# Patient Record
Sex: Male | Born: 1945 | Race: White | Hispanic: No | Marital: Married | Smoking: Former smoker
Health system: Southern US, Community
[De-identification: ages and names within clinical notes are randomized; demographics above are authoritative.]

## PROBLEM LIST (undated history)

## (undated) DIAGNOSIS — F329 Major depressive disorder, single episode, unspecified: Secondary | ICD-10-CM

## (undated) DIAGNOSIS — F32A Depression, unspecified: Secondary | ICD-10-CM

## (undated) DIAGNOSIS — I509 Heart failure, unspecified: Secondary | ICD-10-CM

## (undated) DIAGNOSIS — J15211 Pneumonia due to Methicillin susceptible Staphylococcus aureus: Secondary | ICD-10-CM

## (undated) DIAGNOSIS — E039 Hypothyroidism, unspecified: Secondary | ICD-10-CM

## (undated) DIAGNOSIS — J9621 Acute and chronic respiratory failure with hypoxia: Secondary | ICD-10-CM

## (undated) DIAGNOSIS — I10 Essential (primary) hypertension: Secondary | ICD-10-CM

## (undated) DIAGNOSIS — T80219A Unspecified infection due to central venous catheter, initial encounter: Secondary | ICD-10-CM

## (undated) HISTORY — PX: EYE SURGERY: SHX253

## (undated) HISTORY — DX: Heart failure, unspecified: I50.9

## (undated) HISTORY — DX: Acute and chronic respiratory failure with hypoxia: J96.21

## (undated) HISTORY — DX: Pneumonia due to methicillin susceptible Staphylococcus aureus: J15.211

## (undated) HISTORY — DX: Unspecified infection due to central venous catheter, initial encounter: T80.219A

---

## 2011-06-04 ENCOUNTER — Inpatient Hospital Stay: Payer: Self-pay | Admitting: Internal Medicine

## 2011-06-04 ENCOUNTER — Ambulatory Visit: Payer: Self-pay | Admitting: Internal Medicine

## 2011-06-04 LAB — COMPREHENSIVE METABOLIC PANEL
Albumin: 2.8 g/dL — ABNORMAL LOW (ref 3.4–5.0)
Anion Gap: 15 (ref 7–16)
BUN: 10 mg/dL (ref 7–18)
Chloride: 98 mmol/L (ref 98–107)
EGFR (African American): >60
EGFR (Non-African Amer.): >60
Glucose: 125 mg/dL — ABNORMAL HIGH (ref 65–99)
Osmolality: 265 (ref 275–301)
Potassium: 4.5 mmol/L (ref 3.5–5.1)
SGOT(AST): 43 U/L — ABNORMAL HIGH (ref 15–37)
Sodium: 132 mmol/L — ABNORMAL LOW (ref 136–145)
Total Protein: 6.4 g/dL (ref 6.4–8.2)

## 2011-06-04 LAB — CBC
HGB: 13 g/dL (ref 13.0–18.0)
MCV: 89 fL (ref 80–100)
RBC: 4.38 10*6/uL — ABNORMAL LOW (ref 4.40–5.90)
WBC: 9.5 10*3/uL (ref 3.8–10.6)

## 2011-06-05 LAB — TROPONIN I: Troponin-I: <0.02 ng/mL

## 2011-06-05 LAB — PRO B NATRIURETIC PEPTIDE: B-Type Natriuretic Peptide: 988 pg/mL — ABNORMAL HIGH (ref 0–125)

## 2011-06-05 LAB — BASIC METABOLIC PANEL
Anion Gap: 10 (ref 7–16)
BUN: 9 mg/dL (ref 7–18)
Calcium, Total: 8 mg/dL — ABNORMAL LOW (ref 8.5–10.1)
Co2: 26 mmol/L (ref 21–32)
EGFR (Non-African Amer.): >60
Glucose: 118 mg/dL — ABNORMAL HIGH (ref 65–99)
Potassium: 4 mmol/L (ref 3.5–5.1)
Sodium: 130 mmol/L — ABNORMAL LOW (ref 136–145)

## 2011-06-23 ENCOUNTER — Ambulatory Visit: Payer: Self-pay | Admitting: Internal Medicine

## 2013-05-04 IMAGING — CR DG CHEST 2V
1 series · 2 of 2 positions shown · non-contrast
Comparison: none

REASON FOR EXAM: cough dyspnea
COMMENTS:

PROCEDURE:     MDR - MDR CHEST PA(OR AP) AND LATERAL  - June 04, 2011  [DATE]
RESULT:     There is increased density at the left base compatible with
pneumonia. Followup examination until clear is recommended. Right lung field
is clear. Heart size is upper limits for normal or slightly increased.

[Series 1: pa · 0.17mm/px · 2 of 2 slices shown]
[im 1/2]
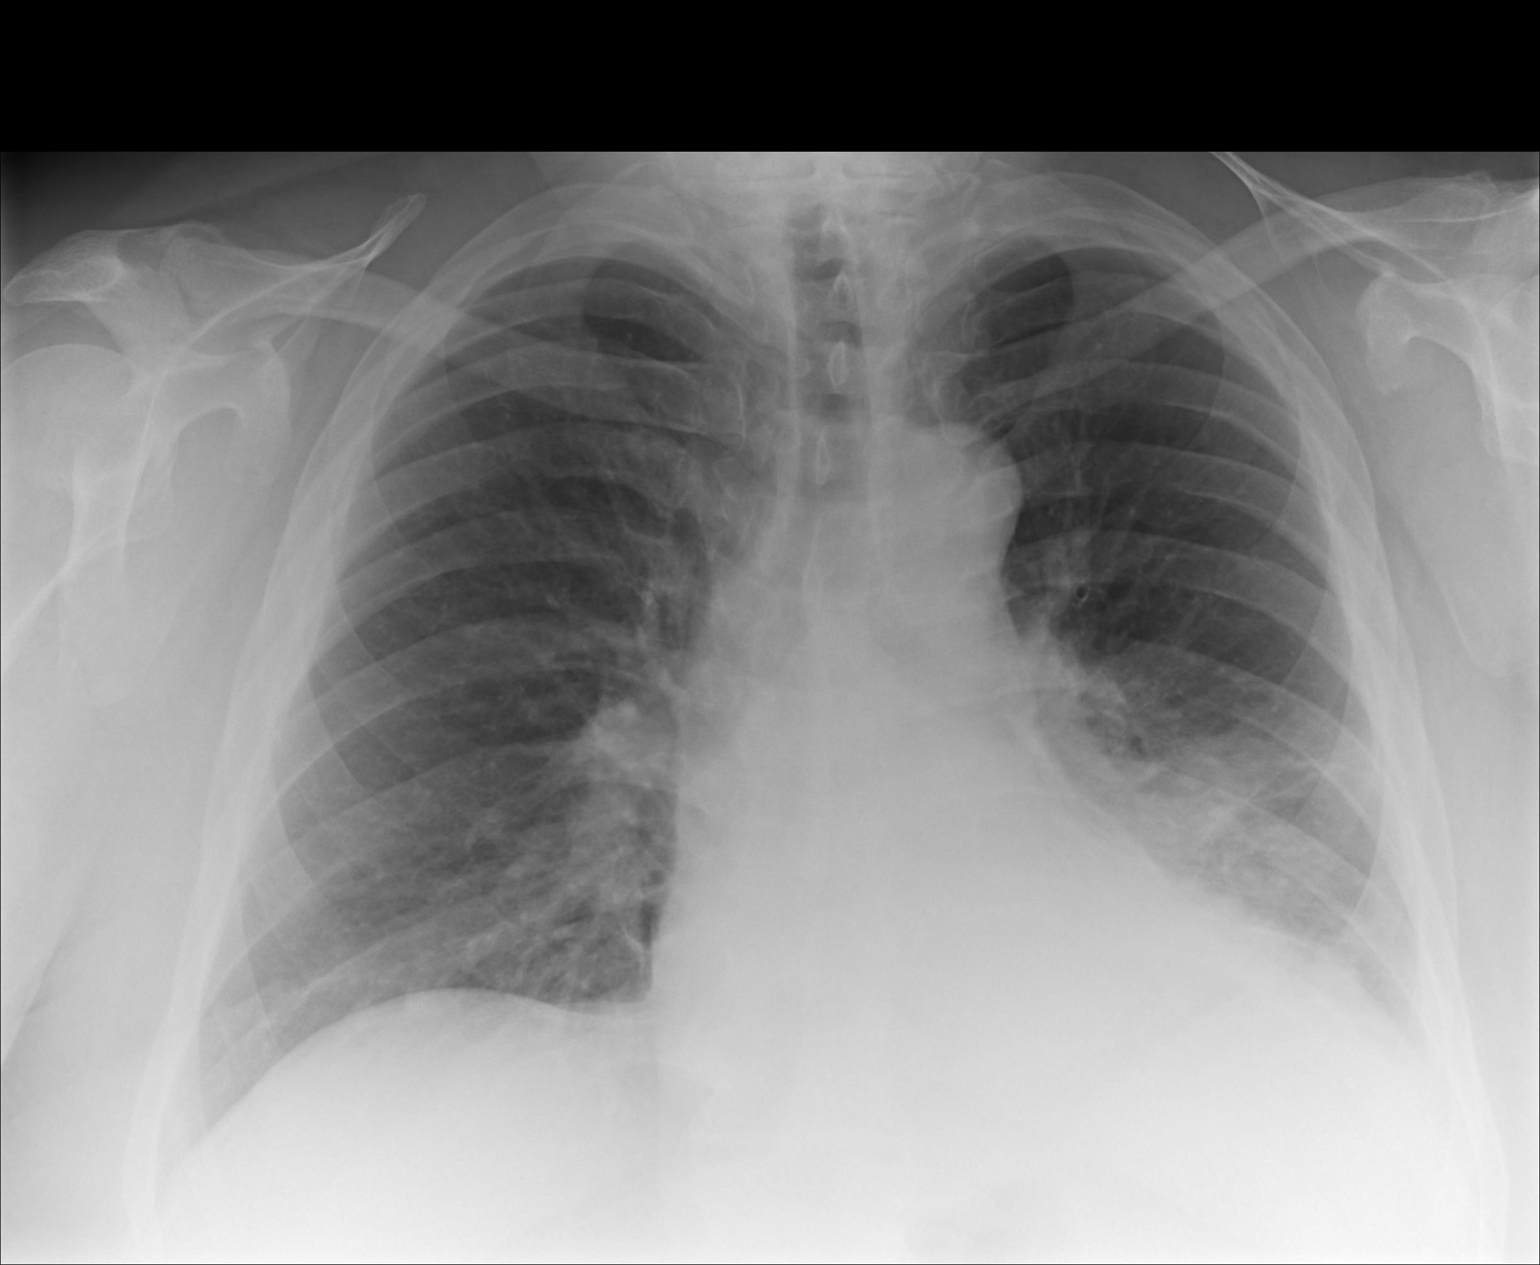
[im 2/2]
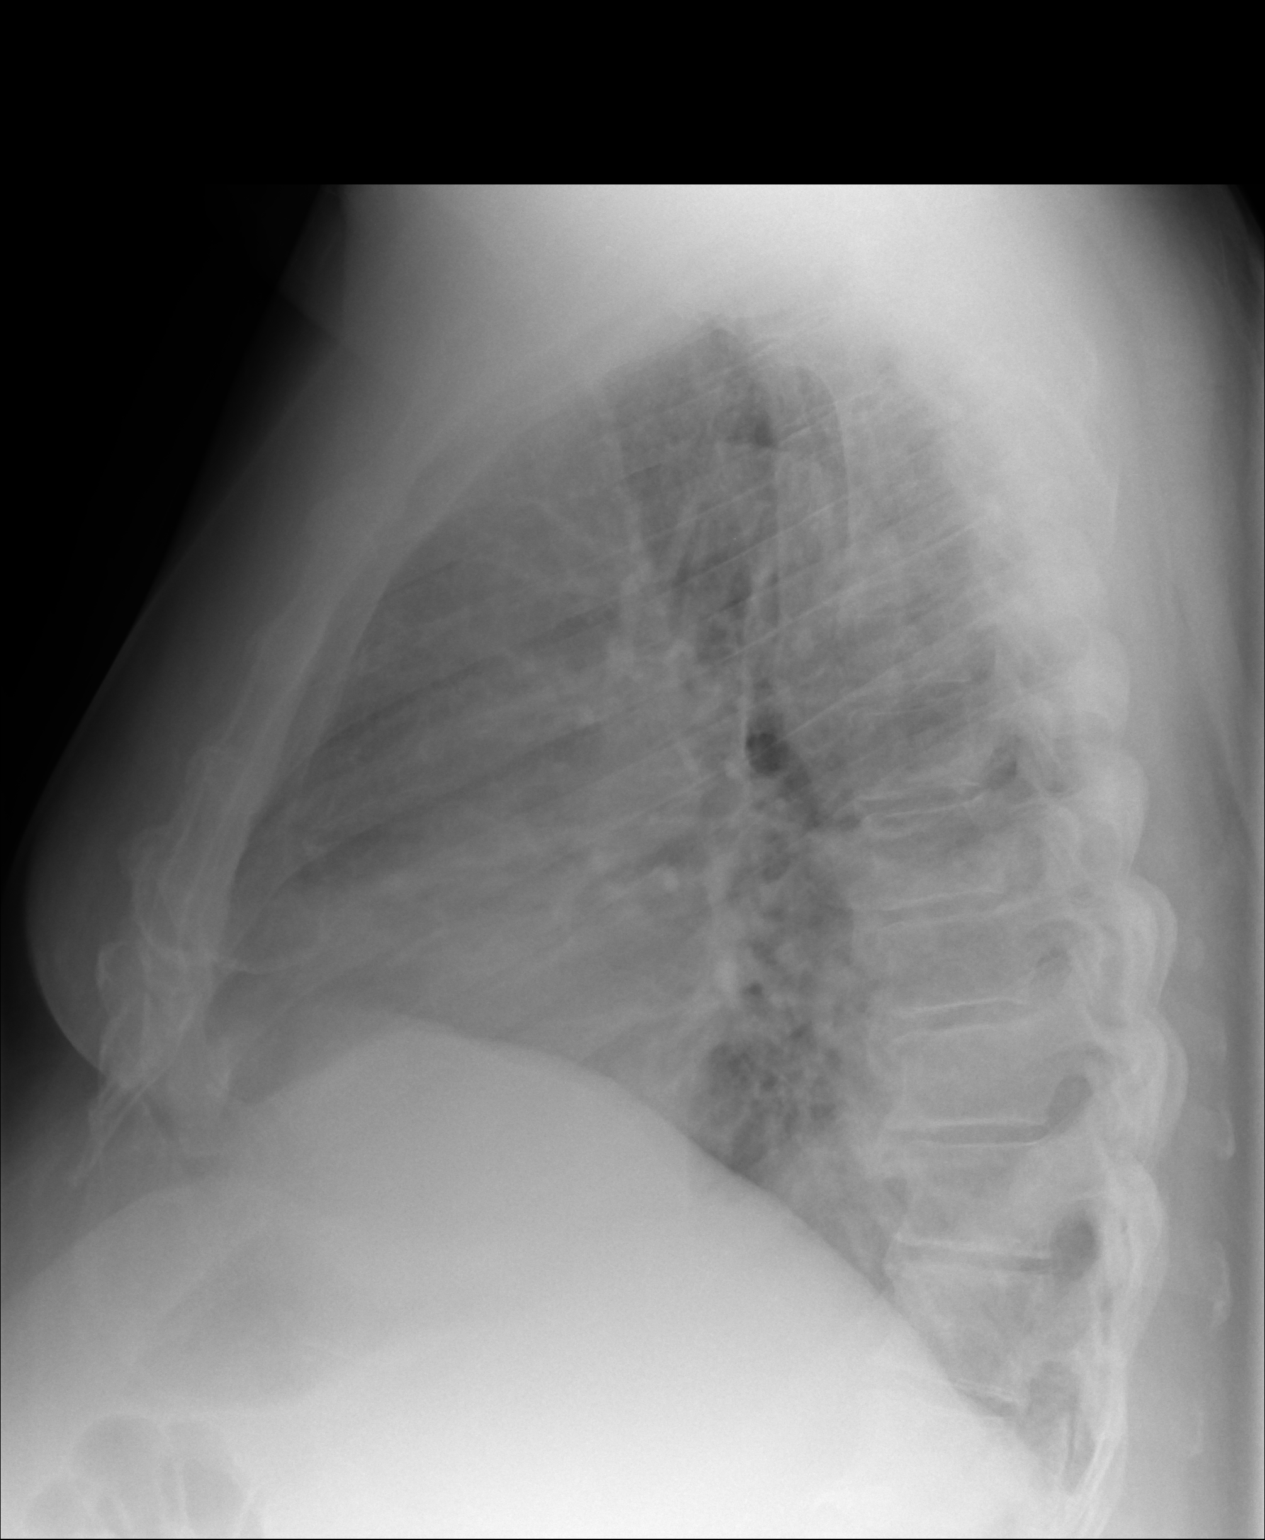

[2 of 2 positions shown; findings below may reference images not displayed]

IMPRESSION: 1. There is increased density at the left base consistent with left basilar
pneumonia. Followup examination until clear is recommended.
2. The heart is a upper limits for normal in size or mildly enlarged.

## 2013-05-09 IMAGING — CR DG CHEST 2V
1 series · 4 of 4 positions shown · non-contrast
Comparison: none

REASON FOR EXAM: pneumonia
COMMENTS:

[Series 1: ap · 0.17mm/px · 4 of 4 slices shown]
[im 1/4]
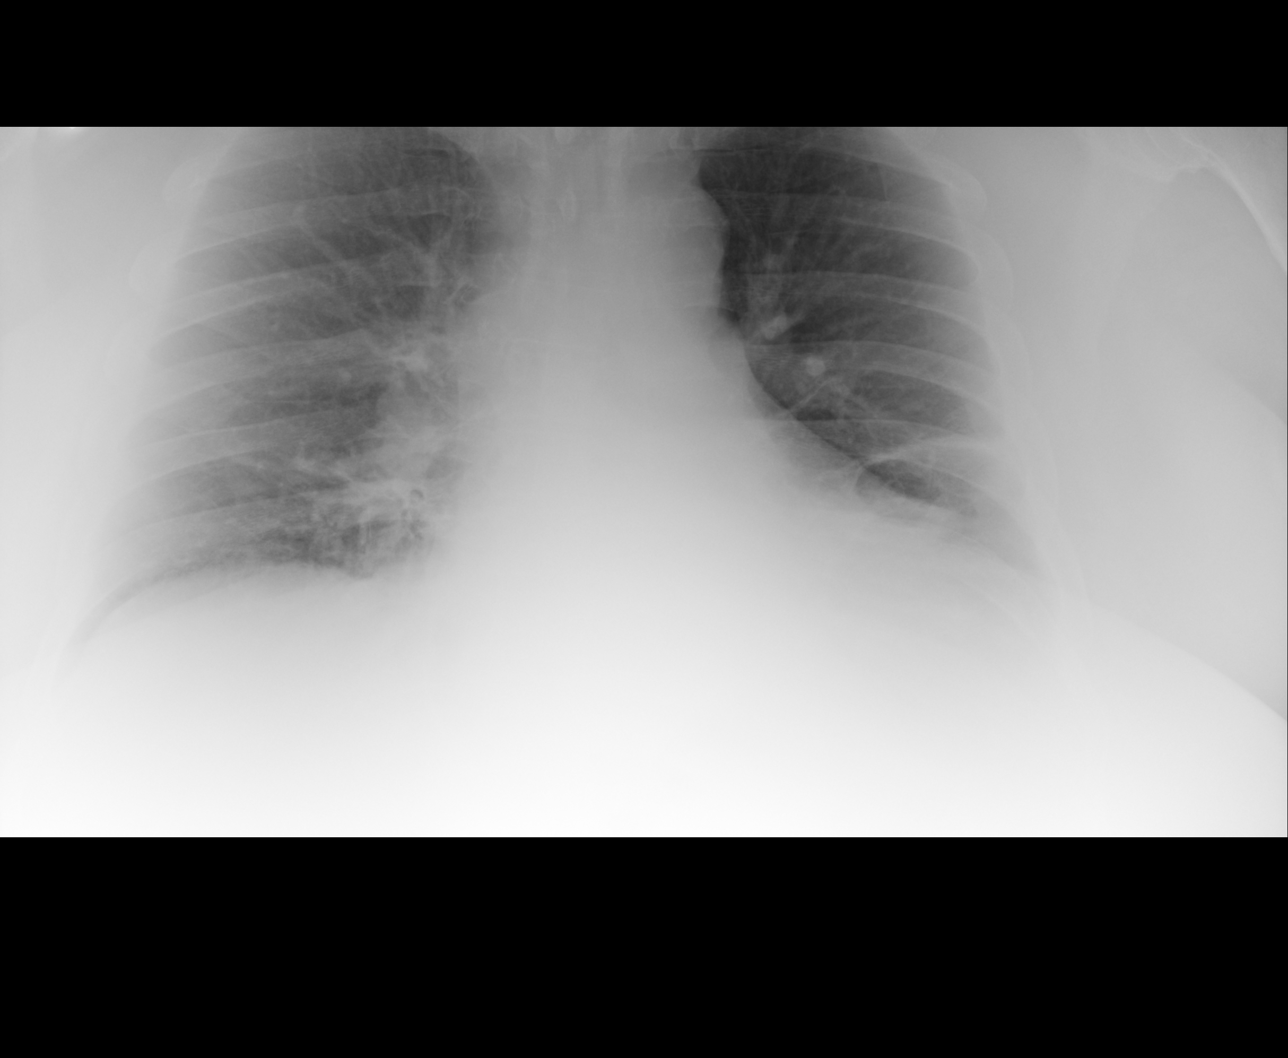
[im 2/4]
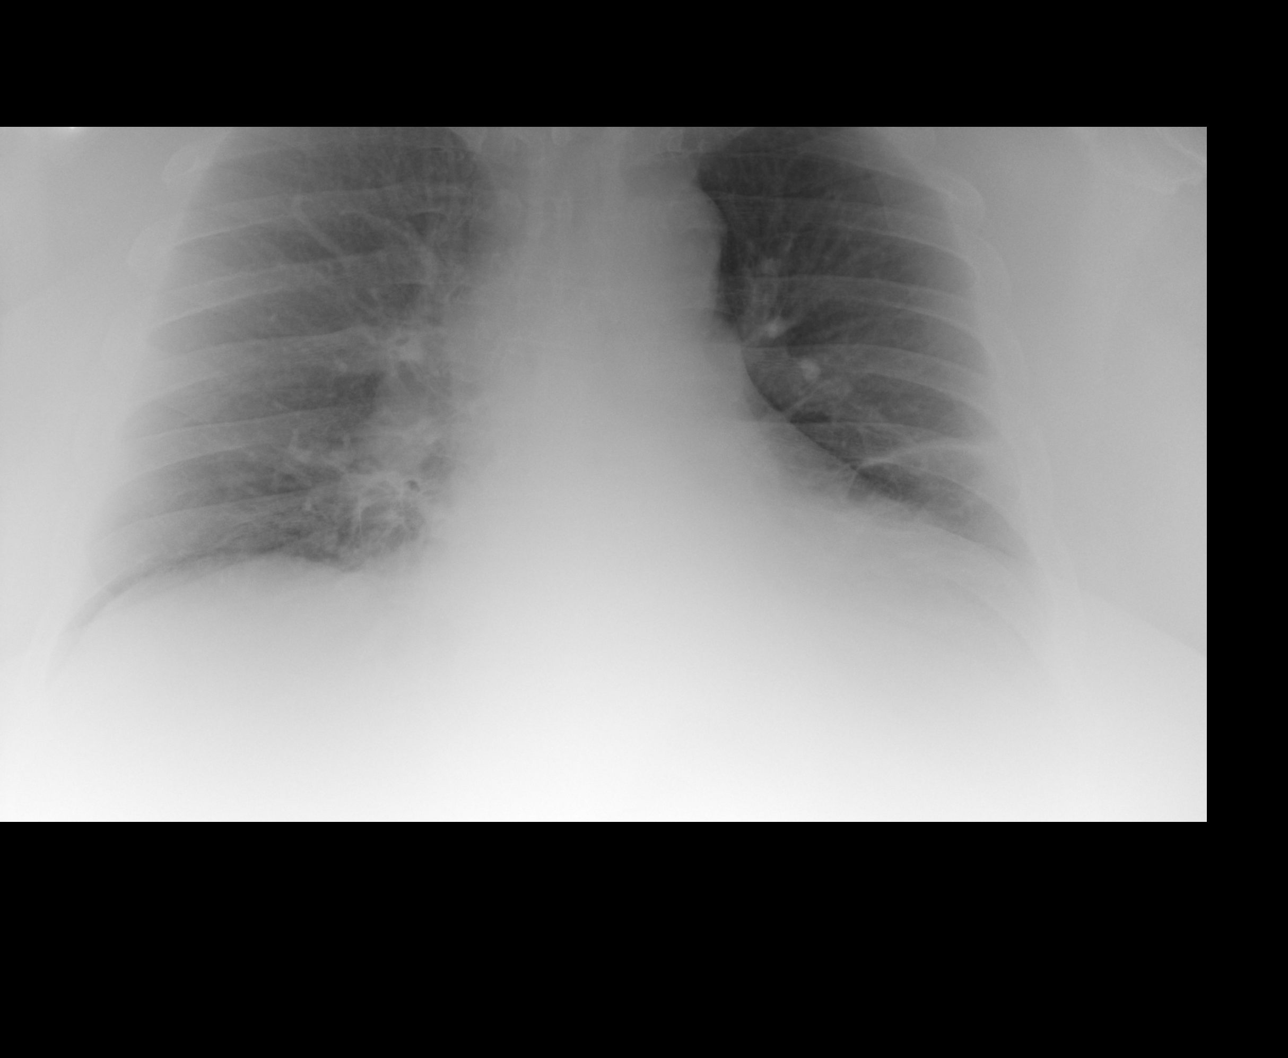
[im 3/4]
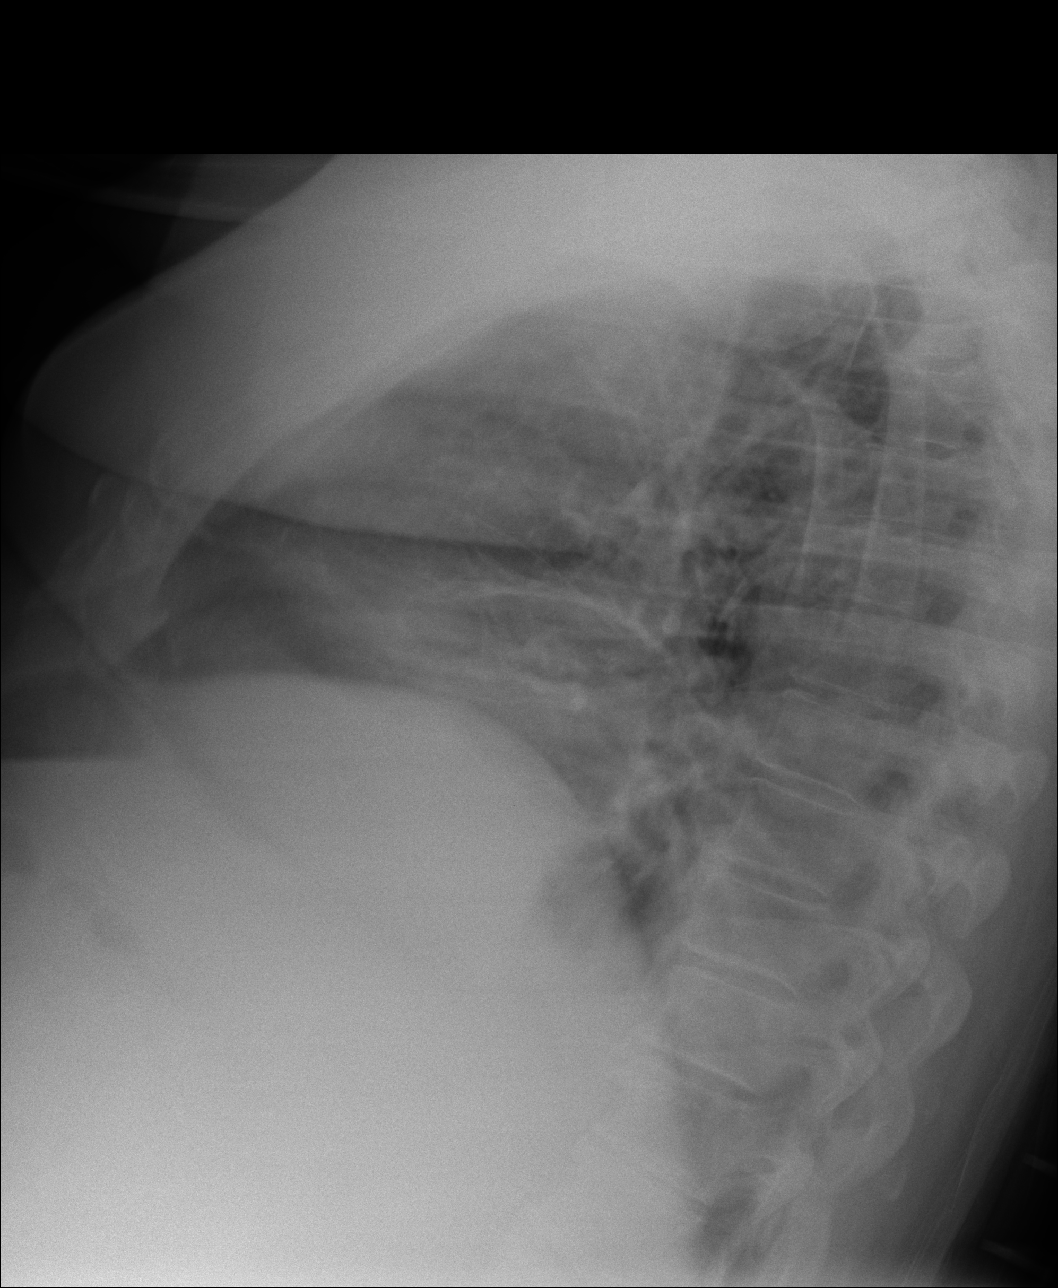
[im 4/4]
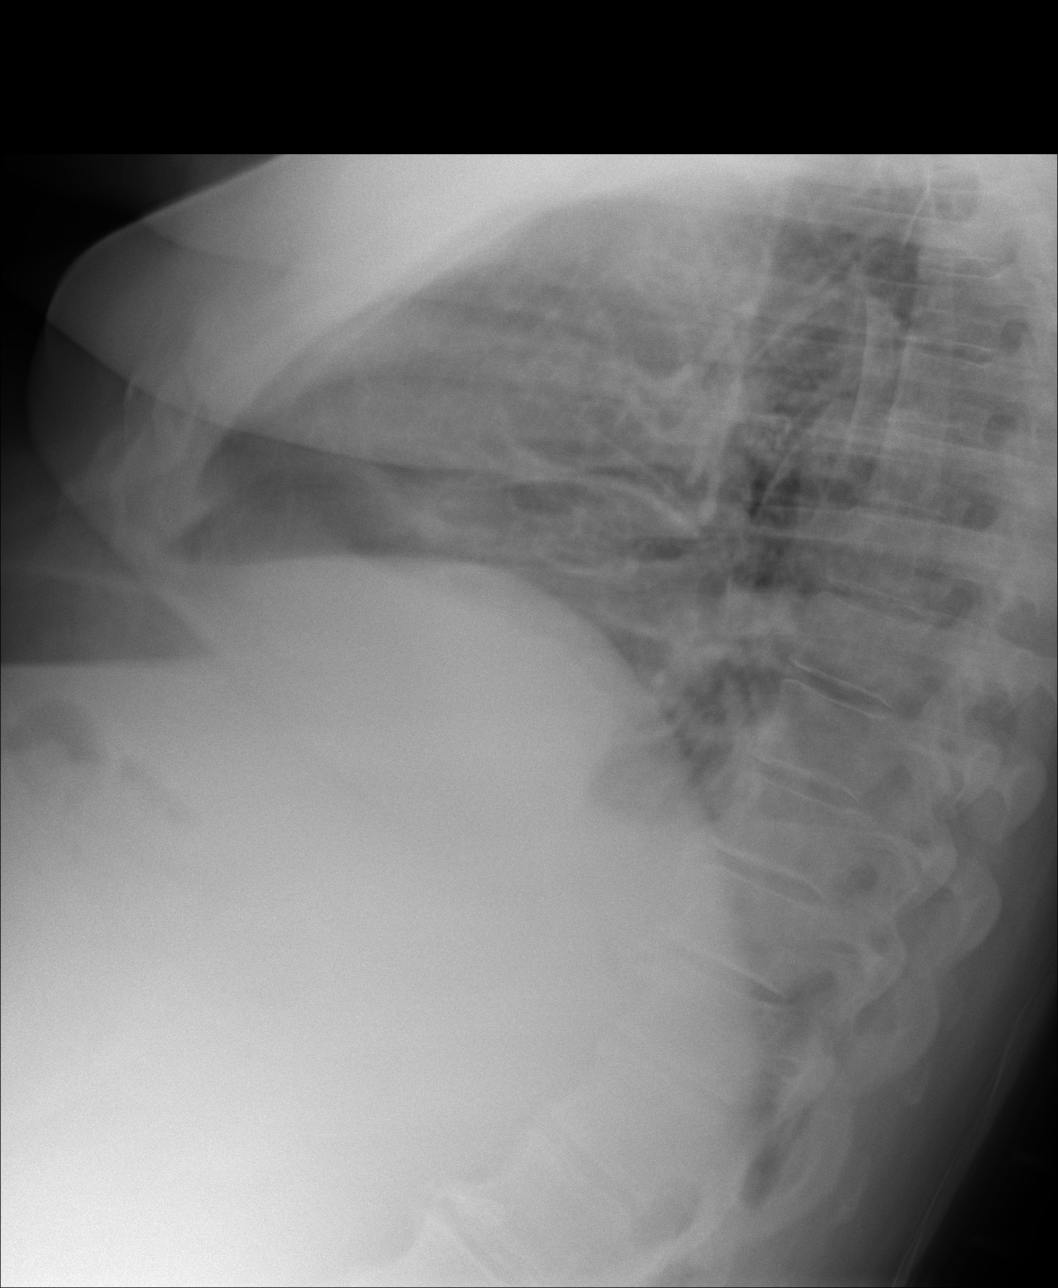

[4 of 4 positions shown; findings below may reference images not displayed]

PROCEDURE:     DXR - DXR CHEST PA (OR AP) AND LATERAL  - June 09, 2011 [DATE]

RESULT:     Comparison is made to the prior exam of 06/04/2011. There is
observed improvement in the previously noted density at the left base.
Resolution is not totally complete. The right lung field is clear. The heart
is stable in size and is again noted to be upper limits for normal or
slightly enlarged.
IMPRESSION: 1. There is observed improvement in the previously noted left basilar
pneumonia or atelectasis.
2. No new pulmonary infiltrates are seen.
3. No pulmonary edema is noted.
4. The heart is upper limits for normal in size or mildly enlarged but
stable in size as compared to the prior exam.

## 2014-08-04 NOTE — Discharge Summary (Signed)
PATIENT NAME:  Anthony Weber, Anthony Weber MR#:  [id number] DATE OF BIRTH:  Aug 14, 1945  DATE OF ADMISSION:  06/04/2011 DATE OF DISCHARGE:  06/09/2011  PRESENTING COMPLAINT: Cough, fever and shortness of breath.   DISCHARGE DIAGNOSES:  1. Right lower lobe pneumonia.  2. Reactive airway disease.  3. Hypertension.  4. Morbid obesity.  5. Suspected sleep apnea, work-up by primary care as an outpatient.  CONDITION ON DISCHARGE: Fair; saturation on room air 94%.   DISCHARGE MEDICATIONS:  1. Levothyroxine 0.05 mg p.o. daily.  2. Simvastatin 40 mg p.o. at bedtime.  3. Cheratussin AC 10/100 mg 5 mL every 6 hours. 4. Lisinopril 40 mg daily.  5. Levaquin 750 mg p.o. daily for five more days.  6. Spiriva one capsule inhalation daily.   DISCHARGE FOLLOWUP: Followup with Dr. Hall Weber on 06/21/2011 at 3:00 p.m.   LABS/STUDIES: Repeat chest x-ray on 06/09/2011 showed observed improvement in previously noted left basilar pneumonia or atelectasis. No pulmonary infiltrate seen.   CT of the chest, on 06/06/2011, showed consolidation involving a large portion of the left lower lobe. Small pleural effusion. No pulmonary embolism noted.   Echo Doppler showed moderately dilated left ventricle. Left ventricle apex is not well visualized. There is no thrombus. Left ventricular systolic function is mildly reduced. Ejection fraction 45 to 50%. There is normal left ventricular wall thickness.  LA and RA size are normal. No pericardial effusion.   Blood cultures show no growth in five days.   Hemoglobin and hematocrit 13 and 39.1, white count 9.5, platelet count 170. Glucose 125, BUN 10, creatinine 0.7, sodium 132, potassium 4.5, chloride 98, SGOT 43, albumin 2.8.   BRIEF SUMMARY OF HOSPITAL COURSE: Anthony Weber is a pleasant 69 year old morbidly obese Caucasian gentleman who came into the emergency room and got admitted with:  1. Left upper lobe pneumonia: The patient was initially started on Rocephin and Zithromax and was  changed to p.o. Levaquin for five more days to complete a total 10 day course of antibiotic. Blood cultures remained negative. The patient received p.r.n. nebulizers and incentive spirometer. He was also started on Solu-Medrol given his extensive wheezing and some underlying reactive airway disease, although he does not have a formal diagnosis of chronic obstructive pulmonary disease or asthma. The patient denies any history of smoking. His wheezing completely resolved. He does not need a p.o. taper steroid. CT of the chest was negative for PE and showed a large left lower lobe consolidation which on chest x-ray prior to discharge showed improvement. He will followup with Dr. Hall Weber on the above set appointment.  2. Hypertension: Home medications were continued with lisinopril.  3. Mild hyponatremia, resolved.  4. Hypothyroidism, on Synthroid.  5. Hypercholesterolemia, on simvastatin.  6. Suspected sleep apnea with obesity: The patient's sleep study and further work-up regarding sleep apnea can be done. We will defer it to Dr. Hall Weber as an outpatient.   His hospital stay otherwise remained stable. The patient remained a FULL CODE.  TIME SPENT: 40 minutes.  ____________________________ Hart Rochester Posey Pronto, MD sap:slb D: 06/10/2011 15:30:24 ET     T: 06/11/2011 11:15:42 ET       JOB#: [id number] cc: Anthony Hubbert A. Posey Pronto, MD, <Dictator> Anthony Carry. Hall Busing, MD Anthony Basset MD ELECTRONICALLY SIGNED 06/16/2011 6:25

## 2014-08-04 NOTE — H&P (Signed)
PATIENT NAME:  Anthony Weber, Anthony Weber MR#:  [id number] DATE OF BIRTH:  1945-07-18  DATE OF ADMISSION:  06/04/2011  PRIMARY CARE PHYSICIAN: Dr. Benita Stabile.    CHIEF COMPLAINT: Cough, fever, and increased shortness of breath.   HISTORY OF PRESENT ILLNESS: Anthony Weber is a 69 year old Caucasian male who was in his usual state of health until about 4 or 5 days ago when he started to have cough with sputum production and what he called congestion. He contacted his primary care physician and he was placed on Zithromax or Z-Pak, and he used also cough medicine. However, his condition worsened and he became short of breath especially upon walking and experienced continuation of fever. Today he called his primary care physician, and a chest x-ray was ordered; and that revealed pneumonia. The patient was brought to the emergency department for evaluation and to be admitted to the hospital for further treatment.   REVIEW OF SYSTEMS: CONSTITUTIONAL: Patient reports fever and chills. No night sweats. He has fatigue. EYES: No blurring of vision. No double vision. ENT: No hearing impairment. No sore throat. No dysphagia. CARDIOVASCULAR: No chest pain, only upon coughing. He admits having shortness of breath. No edema. No syncope. RESPIRATORY: Has cough, sputum production, and shortness of breath. GASTROINTESTINAL: No abdominal pain. No nausea, no vomiting, no diarrhea. GENITOURINARY: No dysuria or frequency of urination.  MUSCULOSKELETAL: No joint swelling. No pain. No muscular pain or swelling. INTEGUMENTARY: No skin rash. No ulcers. NEUROLOGY: No focal weakness. No seizure activity. No headaches. PSYCHIATRY: No anxiety, no depression. ENDOCRINE: No night sweats. No heat or cold intolerance.   PAST MEDICAL HISTORY:  History of systemic hypertension, hypercholesterolemia, hypothyroidism. No prior history of lung disease or coronary artery disease.   FAMILY HISTORY: His mother suffers from hypertension.   SOCIAL HISTORY: He  is married, living with his wife; and he works as a Merchandiser, retail. Social habits: Nonsmoker. There is a remote history of smoking more than 45 years ago, and he was a light smoker. No history of alcoholism.   ADMISSION MEDICATIONS:  1. Lisinopril 40 mg a day.  2. Simvastatin 40 mg once a day.   3. Levothyroxine 50 mcg once a day.   4. Aspirin 325 mg once a day.  5. He takes vitamin C, vitamin D, folic acid, and vitamin B12.   ALLERGIES: No known drug allergies.   PHYSICAL EXAMINATION:   VITAL SIGNS: His blood pressure here 181/86, respiratory rate 25, pulse 112, temperature 101.8, oxygen saturation 93%.   GENERAL APPEARANCE: Elderly male, obese, lying in bed, receiving breathing treatment at this time. He feels that his shortness of breath is better than when he came.   HEAD: No pallor. No icterus. No cyanosis.   EENT: Hearing was normal. Nasal mucosa, lips, tongue were normal. Eyes examination revealed normal eyelids and conjunctiva. Pupils about 4 to 5 mm, equal and reactive to light.   NECK: Supple. Trachea at midline. No cervical lymphadenopathy. No masses.   HEART:  Revealed distant heart sounds. Difficult to auscultate. Faint S1 and S2. No murmur was appreciated. No carotid bruits.   RESPIRATORY:  Normal breathing pattern without use of accessory muscles. No rales or wheezing heard. Breathing sounds are diminished due to his obesity.   ABDOMEN: Morbidly obese, soft without tenderness. No hepatosplenomegaly. No masses. No hernias.   SKIN: No ulcers. No subcutaneous nodules.   MUSCULOSKELETAL: No joint swelling. No clubbing.   NEUROLOGIC: Cranial nerves II through XII were intact. No focal motor deficit.  PSYCHIATRIC: The patient is alert and oriented x3. Mood and affect were normal.   LABORATORY FINDINGS: His EKG showed sinus tachycardia at rate of 111 per minute.  Unremarkable EKG. Chest x-ray showed left lower lobe consolidation consistent with pneumonia. His CBC showed a  white count of 9500, hemoglobin 13, hematocrit 39, platelet count 170,000. His serum glucose 125.  BUN 10, creatinine 0.7. Sodium was 132, potassium 4.5. His estimated GFR is more than 60. His total protein 6.4, albumin was 2.8, total bilirubin 0.4, AST 43, ALT 23.   IMPRESSION:  1. Fever, cough, and increased shortness of breath secondary to pneumonia.  His chest x-ray is consistent with left lower lobe pneumonia.  2. History of systemic hypertension. Blood pressure is uncontrolled possibly secondary to his stress. We need to keep monitoring blood pressure to see if this settles while he is on his lisinopril.  3. History of mild hyponatremia. Sodium is 132. Consider underlying volume overload to bare in mind possibility of CHF as a complicating factor. We need to monitor that.  I will repeat BMP tomorrow.  4. History of hypercholesterolemia.  5. History of hypothyroidism.   6. Obesity.   PLAN: The patient was admitted as inpatient.  Blood cultures were taken. IV antibiotic initiated in the emergency department using Rocephin 1 gram and Zithromax. I will continue the same antibiotics. Albuterol or DuoNeb nebulization q.4 hours p.r.n. Continue home medications as listed above.  Deep vein thrombosis prophylaxis will be initiated using Lovenox 40 mg subcutaneous once a day and peptic ulcer disease prophylaxis using Protonix 40 mg p.o. daily.   TIME NEEDED TO EVALUATE THIS PATIENT: More than 50 minutes.   ____________________________ Anthony Pu. Lenore Manner, MD amd:vtd D: 06/04/2011 35:45:62 ET T: 06/05/2011 08:52:36 ET JOB#: [id number] cc: Anthony Pu. Lenore Manner, MD, <Dictator> Leona Carry. Hall Busing, MD Ellin Saba MD ELECTRONICALLY SIGNED 06/06/2011 3:47

## 2015-03-03 ENCOUNTER — Encounter
Admission: RE | Disposition: A | Discharge status: Home / Self Care | Payer: Self-pay | Source: Ambulatory Visit | Attending: Unknown Physician Specialty

## 2015-03-03 ENCOUNTER — Ambulatory Visit: Payer: Medicare HMO | Admitting: Anesthesiology

## 2015-03-03 ENCOUNTER — Ambulatory Visit
Admission: RE | Admit: 2015-03-03 | Discharge: 2015-03-03 | Disposition: A | Discharge status: Home / Self Care | Payer: Medicare HMO | Source: Ambulatory Visit | Attending: Unknown Physician Specialty | Admitting: Unknown Physician Specialty

## 2015-03-03 DIAGNOSIS — K5730 Diverticulosis of large intestine without perforation or abscess without bleeding: Secondary | ICD-10-CM | POA: Diagnosis not present

## 2015-03-03 DIAGNOSIS — K640 First degree hemorrhoids: Secondary | ICD-10-CM | POA: Insufficient documentation

## 2015-03-03 DIAGNOSIS — E039 Hypothyroidism, unspecified: Secondary | ICD-10-CM | POA: Insufficient documentation

## 2015-03-03 DIAGNOSIS — Z1211 Encounter for screening for malignant neoplasm of colon: Secondary | ICD-10-CM | POA: Insufficient documentation

## 2015-03-03 DIAGNOSIS — Z87891 Personal history of nicotine dependence: Secondary | ICD-10-CM | POA: Insufficient documentation

## 2015-03-03 DIAGNOSIS — D124 Benign neoplasm of descending colon: Secondary | ICD-10-CM | POA: Diagnosis not present

## 2015-03-03 DIAGNOSIS — I10 Essential (primary) hypertension: Secondary | ICD-10-CM | POA: Insufficient documentation

## 2015-03-03 DIAGNOSIS — F329 Major depressive disorder, single episode, unspecified: Secondary | ICD-10-CM | POA: Diagnosis not present

## 2015-03-03 HISTORY — DX: Depression, unspecified: F32.A

## 2015-03-03 HISTORY — DX: Essential (primary) hypertension: I10

## 2015-03-03 HISTORY — DX: Hypothyroidism, unspecified: E03.9

## 2015-03-03 HISTORY — DX: Major depressive disorder, single episode, unspecified: F32.9

## 2015-03-03 HISTORY — PX: COLONOSCOPY WITH PROPOFOL: SHX5780

## 2015-03-03 SURGERY — COLONOSCOPY WITH PROPOFOL
Anesthesia: General

## 2015-03-03 MED ORDER — SODIUM CHLORIDE 0.9 % IV SOLN: INTRAVENOUS | Status: DC

## 2015-03-03 MED ORDER — DEXMEDETOMIDINE HCL 200 MCG/2ML IV SOLN
INTRAVENOUS | Status: DC | PRN
  Administered 2015-03-03: 8 ug via INTRAVENOUS

## 2015-03-03 MED ORDER — GLYCOPYRROLATE 0.2 MG/ML IJ SOLN
INTRAMUSCULAR | Status: DC | PRN
Start: 2015-03-03 — End: 2015-03-03
  Administered 2015-03-03: 0.2 mg via INTRAVENOUS

## 2015-03-03 MED ORDER — EPHEDRINE SULFATE 50 MG/ML IJ SOLN
INTRAMUSCULAR | Status: DC | PRN
  Administered 2015-03-03: 10 mg via INTRAVENOUS

## 2015-03-03 MED ORDER — PROPOFOL 500 MG/50ML IV EMUL
INTRAVENOUS | Status: DC | PRN
  Administered 2015-03-03: 100 ug/kg/min via INTRAVENOUS

## 2015-03-03 MED ORDER — SODIUM CHLORIDE 0.9 % IV SOLN
INTRAVENOUS | Status: DC
  Administered 2015-03-03: 1000 mL via INTRAVENOUS

## 2015-03-03 MED ORDER — LACTATED RINGERS IV SOLN
INTRAVENOUS | Status: DC | PRN
  Administered 2015-03-03: 11:00:00 via INTRAVENOUS

## 2015-03-03 MED ORDER — PROPOFOL 10 MG/ML IV BOLUS
INTRAVENOUS | Status: DC | PRN
  Administered 2015-03-03: 10 mg via INTRAVENOUS
  Administered 2015-03-03: 50 mg via INTRAVENOUS

## 2015-03-03 NOTE — Transfer of Care (Signed)
Immediate Anesthesia Transfer of Care Note  Patient: Anthony Weber  Procedure(s) Performed: Procedure(s): COLONOSCOPY WITH PROPOFOL (N/A)  Patient Location: PACU and Endoscopy Unit  Anesthesia Type:General  Level of Consciousness: awake and alert   Airway & Oxygen Therapy: Patient Spontanous Breathing and Patient connected to nasal cannula oxygen  Post-op Assessment: Report given to RN and Post -op Vital signs reviewed and stable  Post vital signs: Reviewed and stable  Last Vitals:  Filed Vitals:   03/03/15 1006  BP: 127/60  Pulse: 71  Temp: 36.2 C  Resp: 18    Complications: No apparent anesthesia complications

## 2015-03-03 NOTE — Op Note (Signed)
[facility] Gastroenterology Patient Name: Anthony Weber Procedure Date: 03/03/2015 10:32 AM MRN: [id number] Account #: [id number] Date of Birth: [birth year] Admit Type: Outpatient Age: 69 Room: [facility] ENDO ROOM 1 Gender: Male Note Status: Finalized Procedure:         Colonoscopy Indications:       Screening for colorectal malignant neoplasm Providers:         Manya Silvas, MD Referring MD:      Leona Carry. Hall Busing, MD (Referring MD) Medicines:         Propofol per Anesthesia Complications:     No immediate complications. Procedure:         Pre-Anesthesia Assessment:                    - After reviewing the risks and benefits, the patient was                     deemed in satisfactory condition to undergo the procedure.                    After obtaining informed consent, the colonoscope was                     passed under direct vision. Throughout the procedure, the                     patient's blood pressure, pulse, and oxygen saturations                     were monitored continuously. The Colonoscope was                     introduced through the anus and advanced to the the cecum,                     identified by appendiceal orifice and ileocecal valve. The                     colonoscopy was performed without difficulty. The patient                     tolerated the procedure well. The quality of the bowel                     preparation was adequate to identify polyps. Findings:      A diminutive polyp was found in the distal descending colon. The polyp       was sessile. The polyp was removed with a jumbo cold forceps. Resection       and retrieval were complete.      A few small-mouthed diverticula were found in the sigmoid colon.      Internal hemorrhoids were found during endoscopy. The hemorrhoids were       small and Grade I (internal hemorrhoids that do not prolapse).      The exam was otherwise without abnormality. Impression:        - One  diminutive polyp in the distal descending colon.                     Resected and retrieved.                    - Diverticulosis in the sigmoid colon.                    -  Internal hemorrhoids.                    - The examination was otherwise normal. Recommendation:    - Await pathology results. Manya Silvas, MD 03/03/2015 11:04:38 AM This report has been signed electronically. Number of Addenda: 0 Note Initiated On: 03/03/2015 10:32 AM Scope Withdrawal Time: 0 hours 6 minutes 40 seconds  Total Procedure Duration: 0 hours 11 minutes 53 seconds       [facility]

## 2015-03-03 NOTE — H&P (Signed)
   Primary Care Physician:  Albina Billet, MD Primary Gastroenterologist:  Dr. Vira Agar  Pre-Procedure History & Physical: HPI:  Anthony Weber is a 69 y.o. male is here for an colonoscopy.   Past Medical History  Diagnosis Date  . Hypertension   . Hypothyroidism   . Depression     Past Surgical History  Procedure Laterality Date  . Eye surgery      Prior to Admission medications   Not on File    Allergies as of 01/20/2015  . (Not on File)    History reviewed. No pertinent family history.  Social History   Social History  . Marital Status: Married    Spouse Name: N/A  . Number of Children: N/A  . Years of Education: N/A   Occupational History  . Not on file.   Social History Main Topics  . Smoking status: Former Research scientist (life sciences)  . Smokeless tobacco: Not on file  . Alcohol Use: No  . Drug Use: Not on file  . Sexual Activity: Not on file   Other Topics Concern  . Not on file   Social History Narrative  . No narrative on file    Review of Systems: See HPI, otherwise negative ROS  Physical Exam: BP 127/60 mmHg  Pulse 71  Temp(Src) 97.2 F (36.2 C) (Tympanic)  Resp 18  Ht 5\' 10"  (1.778 m)  Wt 136.079 kg (300 lb)  BMI 43.05 kg/m2  SpO2 98% General:   Alert,  pleasant and cooperative in NAD Head:  Normocephalic and atraumatic. Neck:  Supple; no masses or thyromegaly. Lungs:  Clear throughout to auscultation.    Heart:  Regular rate and rhythm. Abdomen:  Soft, nontender and nondistended. Normal bowel sounds, without guarding, and without rebound.   Neurologic:  Alert and  oriented x4;  grossly normal neurologically.  Impression/Plan: Anthony Weber is here for an colonoscopy to be performed for screening  Risks, benefits, limitations, and alternatives regarding  colonoscopy have been reviewed with the patient.  Questions have been answered.  All parties agreeable.   Gaylyn Cheers, MD  03/03/2015, 10:43 AM

## 2015-03-03 NOTE — Anesthesia Preprocedure Evaluation (Addendum)
Anesthesia Evaluation  Patient identified by MRN, date of birth, ID band Patient awake    Reviewed: Allergy & Precautions, NPO status , Patient's Chart, lab work & pertinent test results  Airway Mallampati: II  TM Distance: >3 FB Neck ROM: Full    Dental  (+) Partial Upper, Lower Dentures   Pulmonary former smoker,    Pulmonary exam normal breath sounds clear to auscultation       Cardiovascular hypertension, Pt. on medications Normal cardiovascular exam     Neuro/Psych Depression negative neurological ROS     GI/Hepatic negative GI ROS, Neg liver ROS,   Endo/Other  Hypothyroidism   Renal/GU negative Renal ROS  negative genitourinary   Musculoskeletal negative musculoskeletal ROS (+)   Abdominal Normal abdominal exam  (+)   Peds negative pediatric ROS (+)  Hematology negative hematology ROS (+)   Anesthesia Other Findings obese  Reproductive/Obstetrics                            Anesthesia Physical Anesthesia Plan  ASA: III  Anesthesia Plan: General   Post-op Pain Management:    Induction: Intravenous  Airway Management Planned: Nasal Cannula  Additional Equipment:   Intra-op Plan:   Post-operative Plan:   Informed Consent: I have reviewed the patients History and Physical, chart, labs and discussed the procedure including the risks, benefits and alternatives for the proposed anesthesia with the patient or authorized representative who has indicated his/her understanding and acceptance.   Dental advisory given  Plan Discussed with: CRNA and Surgeon  Anesthesia Plan Comments:         Anesthesia Quick Evaluation

## 2015-03-04 ENCOUNTER — Encounter: Payer: Self-pay | Admitting: Unknown Physician Specialty

## 2015-03-04 LAB — SURGICAL PATHOLOGY

## 2015-03-07 NOTE — Anesthesia Postprocedure Evaluation (Signed)
Anesthesia Post Note  Patient: Anthony Weber  Procedure(s) Performed: Procedure(s) (LRB): COLONOSCOPY WITH PROPOFOL (N/A)  Patient location during evaluation: Endoscopy Anesthesia Type: General Level of consciousness: awake, awake and alert and oriented Pain management: pain level controlled Vital Signs Assessment: post-procedure vital signs reviewed and stable Respiratory status: spontaneous breathing Cardiovascular status: blood pressure returned to baseline Anesthetic complications: no    Last Vitals:  Filed Vitals:   03/03/15 1150 03/03/15 1155  BP: 104/53 106/53  Pulse: 73 74  Temp:    Resp: 13 16    Last Pain:  Filed Vitals:   03/04/15 0756  PainSc: 0-No pain                 Gram Siedlecki

## 2015-06-20 DIAGNOSIS — E039 Hypothyroidism, unspecified: Secondary | ICD-10-CM | POA: Diagnosis not present

## 2015-06-20 DIAGNOSIS — E785 Hyperlipidemia, unspecified: Secondary | ICD-10-CM | POA: Diagnosis not present

## 2015-06-20 DIAGNOSIS — I10 Essential (primary) hypertension: Secondary | ICD-10-CM | POA: Diagnosis not present

## 2015-06-23 DIAGNOSIS — E785 Hyperlipidemia, unspecified: Secondary | ICD-10-CM | POA: Diagnosis not present

## 2015-06-23 DIAGNOSIS — I10 Essential (primary) hypertension: Secondary | ICD-10-CM | POA: Diagnosis not present

## 2016-01-02 DIAGNOSIS — I10 Essential (primary) hypertension: Secondary | ICD-10-CM | POA: Diagnosis not present

## 2016-01-02 DIAGNOSIS — E039 Hypothyroidism, unspecified: Secondary | ICD-10-CM | POA: Diagnosis not present

## 2016-01-02 DIAGNOSIS — E785 Hyperlipidemia, unspecified: Secondary | ICD-10-CM | POA: Diagnosis not present

## 2016-01-06 DIAGNOSIS — Z23 Encounter for immunization: Secondary | ICD-10-CM | POA: Diagnosis not present

## 2016-01-06 DIAGNOSIS — E785 Hyperlipidemia, unspecified: Secondary | ICD-10-CM | POA: Diagnosis not present

## 2016-01-06 DIAGNOSIS — E038 Other specified hypothyroidism: Secondary | ICD-10-CM | POA: Diagnosis not present

## 2016-01-06 DIAGNOSIS — I10 Essential (primary) hypertension: Secondary | ICD-10-CM | POA: Diagnosis not present

## 2016-02-19 DIAGNOSIS — J209 Acute bronchitis, unspecified: Secondary | ICD-10-CM | POA: Diagnosis not present

## 2016-02-19 DIAGNOSIS — R062 Wheezing: Secondary | ICD-10-CM | POA: Diagnosis not present

## 2016-05-03 DIAGNOSIS — H524 Presbyopia: Secondary | ICD-10-CM | POA: Diagnosis not present

## 2016-05-03 DIAGNOSIS — H26493 Other secondary cataract, bilateral: Secondary | ICD-10-CM | POA: Diagnosis not present

## 2016-05-27 DIAGNOSIS — H26491 Other secondary cataract, right eye: Secondary | ICD-10-CM | POA: Diagnosis not present

## 2016-05-27 DIAGNOSIS — H26492 Other secondary cataract, left eye: Secondary | ICD-10-CM | POA: Diagnosis not present

## 2016-06-14 DIAGNOSIS — H26493 Other secondary cataract, bilateral: Secondary | ICD-10-CM | POA: Diagnosis not present

## 2016-07-09 DIAGNOSIS — E039 Hypothyroidism, unspecified: Secondary | ICD-10-CM | POA: Diagnosis not present

## 2016-07-09 DIAGNOSIS — I10 Essential (primary) hypertension: Secondary | ICD-10-CM | POA: Diagnosis not present

## 2016-07-09 DIAGNOSIS — E785 Hyperlipidemia, unspecified: Secondary | ICD-10-CM | POA: Diagnosis not present

## 2016-07-13 DIAGNOSIS — E038 Other specified hypothyroidism: Secondary | ICD-10-CM | POA: Diagnosis not present

## 2016-07-13 DIAGNOSIS — I10 Essential (primary) hypertension: Secondary | ICD-10-CM | POA: Diagnosis not present

## 2016-07-13 DIAGNOSIS — E785 Hyperlipidemia, unspecified: Secondary | ICD-10-CM | POA: Diagnosis not present

## 2016-10-26 DIAGNOSIS — L0390 Cellulitis, unspecified: Secondary | ICD-10-CM | POA: Diagnosis not present

## 2016-11-11 DIAGNOSIS — D2272 Melanocytic nevi of left lower limb, including hip: Secondary | ICD-10-CM | POA: Diagnosis not present

## 2016-11-11 DIAGNOSIS — X32XXXA Exposure to sunlight, initial encounter: Secondary | ICD-10-CM | POA: Diagnosis not present

## 2016-11-11 DIAGNOSIS — D2271 Melanocytic nevi of right lower limb, including hip: Secondary | ICD-10-CM | POA: Diagnosis not present

## 2016-11-11 DIAGNOSIS — D2262 Melanocytic nevi of left upper limb, including shoulder: Secondary | ICD-10-CM | POA: Diagnosis not present

## 2016-11-11 DIAGNOSIS — L570 Actinic keratosis: Secondary | ICD-10-CM | POA: Diagnosis not present

## 2016-11-11 DIAGNOSIS — L218 Other seborrheic dermatitis: Secondary | ICD-10-CM | POA: Diagnosis not present

## 2017-01-14 DIAGNOSIS — E033 Postinfectious hypothyroidism: Secondary | ICD-10-CM | POA: Diagnosis not present

## 2017-01-14 DIAGNOSIS — E785 Hyperlipidemia, unspecified: Secondary | ICD-10-CM | POA: Diagnosis not present

## 2017-01-14 DIAGNOSIS — I10 Essential (primary) hypertension: Secondary | ICD-10-CM | POA: Diagnosis not present

## 2017-02-15 DIAGNOSIS — X32XXXA Exposure to sunlight, initial encounter: Secondary | ICD-10-CM | POA: Diagnosis not present

## 2017-02-15 DIAGNOSIS — L218 Other seborrheic dermatitis: Secondary | ICD-10-CM | POA: Diagnosis not present

## 2017-02-15 DIAGNOSIS — L570 Actinic keratosis: Secondary | ICD-10-CM | POA: Diagnosis not present

## 2020-03-19 ENCOUNTER — Other Ambulatory Visit: Payer: Self-pay

## 2020-03-19 ENCOUNTER — Ambulatory Visit: Payer: Medicare HMO | Admitting: Cardiovascular Disease

## 2020-03-19 ENCOUNTER — Encounter: Payer: Self-pay | Admitting: Cardiovascular Disease

## 2020-03-19 ENCOUNTER — Telehealth: Payer: Self-pay | Admitting: Cardiovascular Disease

## 2020-03-19 VITALS — BP 148/68 | HR 72 | Ht 70.0 in | Wt 313.0 lb

## 2020-03-19 DIAGNOSIS — R0789 Other chest pain: Secondary | ICD-10-CM

## 2020-03-19 DIAGNOSIS — R0602 Shortness of breath: Secondary | ICD-10-CM

## 2020-03-19 DIAGNOSIS — I209 Angina pectoris, unspecified: Secondary | ICD-10-CM

## 2020-03-19 NOTE — Progress Notes (Signed)
Cardiology Office Note  Date:  03/19/2020   ID:  ASAPH SERENA, DOB May 27, 1945, MRN [id number]  PCP:  Albina Billet, MD   Chief Complaint  Patient presents with  . New Patient (Initial Visit)    Referred by Dr. Hall Busing for SOB  Pt states he gets SOB on exertion--denies CP. States his hands feels numb, hurts to raise his arms up.     HPI:  Mr. Hervey Ard is a 74 year old gentleman with past medical history of Hypertension Morbid obesity Hyperlipidemia Mildly reduced ejection fraction by echocardiogram 2013 EF 45 to 50% Prior pneumonia 2013 Presenting by referral from Dr. Hall Busing for dyspnea on exertion  Reports having Vaccine one month ago SOB x 2 weeks with exertion Rare SOB at rest Reports episodes such as walking the garbage can down to the curb and back having to stop secondary to shortness of breath, tightness  Wife sitting with him today reports this is new symptoms, typically he does not have to stop  No regular exercise program Denies having excessive leg edema, PND orthopnea  He did have high salt fluid intake over the holidays does drink significant fluid/water daily, " told to drink lots of water" But feels the symptoms started prior to Thanksgiving  PNA 2013  large portion of the left lower lobe. Small pleural effusion  Echo Doppler 2013 showed moderately dilated left ventricle. Left ventricle apex is not well visualized. There is no thrombus. Left ventricular systolic function is mildly reduced. Ejection fraction 45 to 50%. There is normal left ventricular wall thickness.  LA and RA size are normal. No pericardial effusion.   Started on lasix 20 last night.  Not much urination yet  EKG personally reviewed by myself on todays visit Shows normal sinus rhythm no significant ST-T wave changes   PMH:   has a past medical history of Depression, Hypertension, and Hypothyroidism.  PSH:    Past Surgical History:  Procedure Laterality Date  . COLONOSCOPY WITH PROPOFOL  N/A 03/03/2015   Procedure: COLONOSCOPY WITH PROPOFOL;  Surgeon: Manya Silvas, MD;  Location: [facility] ENDOSCOPY;  Service: Endoscopy;  Laterality: N/A;  . EYE SURGERY      Current Outpatient Medications  Medication Sig Dispense Refill  . Ascorbic Acid (VITAMIN C) 100 MG tablet Take 100 mg by mouth daily.    . cholecalciferol (VITAMIN D) 400 UNITS TABS tablet Take 400 Units by mouth.    . Cyanocobalamin (VITAMIN B 12 PO) Take by mouth.    . folic acid (FOLVITE) 1 MG tablet Take 1 mg by mouth daily.    . furosemide (LASIX) 20 MG tablet Take 20 mg by mouth daily.    Marland Kitchen levothyroxine (SYNTHROID, LEVOTHROID) 50 MCG tablet Take 50 mcg by mouth daily before breakfast.    . lisinopril (PRINIVIL,ZESTRIL) 40 MG tablet Take 40 mg by mouth daily.    . pravastatin (PRAVACHOL) 40 MG tablet Take 40 mg by mouth daily.     No current facility-administered medications for this visit.     Allergies:   Patient has no known allergies.   Social History:  The patient  reports that he has quit smoking. He has quit using smokeless tobacco.  His smokeless tobacco use included chew. He reports that he does not drink alcohol.   Family History:   family history is not on file.    Review of Systems: Review of Systems  Constitutional: Negative.   HENT: Negative.   Respiratory: Positive for shortness of breath.  Cardiovascular: Positive for chest pain.  Gastrointestinal: Negative.   Musculoskeletal: Negative.   Neurological: Negative.   Psychiatric/Behavioral: Negative.   All other systems reviewed and are negative.    PHYSICAL EXAM: VS:  BP (!) 148/68   Pulse 72   Ht 5\' 10"  (1.778 m)   Wt (!) 313 lb (142 kg)   BMI 44.91 kg/m  , BMI Body mass index is 44.91 kg/m. GEN: Well nourished, well developed, in no acute distress obese HEENT: normal Neck: no JVD, carotid bruits, or masses Cardiac: RRR; no murmurs, rubs, or gallops,no edema  Respiratory:  clear to auscultation bilaterally, normal work of  breathing GI: soft, nontender, nondistended, + BS MS: no deformity or atrophy Skin: warm and dry, no rash Neuro:  Strength and sensation are intact Psych: euthymic mood, full affect   Recent Labs: No results found for requested labs within last 8760 hours.    Lipid Panel No results found for: CHOL, HDL, LDLCALC, TRIG    Wt Readings from Last 3 Encounters:  03/19/20 (!) 313 lb (142 kg)  03/03/15 300 lb (136.1 kg)       ASSESSMENT AND PLAN:  Problem List Items Addressed This Visit    None    Visit Diagnoses    Angina pectoris ([place])    -  Primary   Relevant Medications   furosemide (LASIX) 20 MG tablet   Other Relevant Orders   EKG 12-Lead   ECHOCARDIOGRAM COMPLETE   NM Myocar Multi W/Spect W/Wall Motion / EF   Morbid obesity ([place])       Other chest pain       Relevant Orders   EKG 12-Lead   ECHOCARDIOGRAM COMPLETE   NM Myocar Multi W/Spect W/Wall Motion / EF   Shortness of breath       Relevant Orders   ECHOCARDIOGRAM COMPLETE   NM Myocar Multi W/Spect W/Wall Motion / EF     Shortness of breath Etiology unclear at this time though risk factors including hyperlipidemia, hypertension, male, diabetes High risk of underlying coronary disease -Poor candidate for cardiac CTA given his size Will try to avoid invasive studies such as catheterization We will arrange pharmacologic Myoview, he is unable to treadmill Also schedule echocardiogram to rule out valvular heart disease, cardiomyopathy, pulmonary hypertension He did have mildly depressed ejection fraction 45 to 50% in 2013, this will help follow-up on that -Suggested he continue his Lasix for now  Morbid obesity After work-up as detailed above, we will recommend lifestyle modification, we did discuss diet with him today He does do protein shake daily and weight is trending downward  Hyperlipidemia We will evaluate coronary calcification on his stress testing to help guide cholesterol management For heavy  calcification, would aim for total cholesterol less than 150 Currently on a statin  Essential hypertension Continue lisinopril, no changes made Blood pressure mildly elevated from long walk into the office, recommended he use his blood pressure cuff at home and call us with numbers   Total encounter time more than 60 minutes  Greater than 50% was spent in counseling and coordination of care with the patient    Signed, Esmond Plants, M.D., Ph.D. [place], [place]

## 2020-03-19 NOTE — Telephone Encounter (Signed)
Per nuclear medicine auth pending so testing is to be rescheduled.  Unknown timeframe for auth . Please advise on rescheduling time frame.  Dr Rockey Situ wanted asap

## 2020-03-19 NOTE — Patient Instructions (Addendum)
Medication Instructions:  No changes  If you need a refill on your cardiac medications before your next appointment, please call your pharmacy.    Lab work: No new labs needed   If you have labs (blood work) drawn today and your tests are completely normal, you will receive your results only by: Marland Kitchen MyChart Message (if you have MyChart) OR . A paper copy in the mail If you have any lab test that is abnormal or we need to change your treatment, we will call you to review the results.   Testing/Procedures: Echo for shortness of breath  Your physician has requested that you have an echocardiogram. Echocardiography is a painless test that uses sound waves to create images of your heart. It provides your doctor with information about the size and shape of your heart and how well your heart's chambers and valves are working. This procedure takes approximately one hour. There are no restrictions for this procedure.  There is a possibility that an IV may need to be started during your test to inject an image enhancing agent. This is done to obtain more optimal pictures of your heart. Therefore we ask that you do at least drink some water prior to coming in to hydrate your veins.    Lexiscan myoview Friday, (tomorrow, 03/20/2020 the cameras are down)for shortness of breath/possible angina  [place] caregiver has ordered a Stress Test with nuclear imaging. The purpose of this test is to evaluate the blood supply to your heart muscle. This procedure is referred to as a "Non-Invasive Stress Test." This is because other than having an IV started in your vein, nothing is inserted or "invades" your body. Cardiac stress tests are done to find areas of poor blood flow to the heart by determining the extent of coronary artery disease (CAD). Some patients exercise on a treadmill, which naturally increases the blood flow to your heart, while others who are  unable to walk on a treadmill due to  physical limitations have a pharmacologic/chemical stress agent called Lexiscan . This medicine will mimic walking on a treadmill by temporarily increasing your coronary blood flow.   Please note: these test may take anywhere between 2-4 hours to complete  PLEASE REPORT TO [facility] AT THE FIRST DESK WILL DIRECT YOU WHERE TO GO  Date of Procedure:_____________________________________  Arrival Time for Procedure:______________________________  Instructions regarding medication:   _none___ : Hold diabetes medication morning of procedure  _none___:  Hold betablocker(s) night before procedure and morning of procedure  ____:  Hold other medications as follows:_______________________________________________________________________________ All Your medication may be taken with water ____________________________________________________________________________  PLEASE NOTIFY THE OFFICE AT LEAST 24 HOURS IN ADVANCE IF YOU ARE UNABLE TO KEEP YOUR APPOINTMENT.  [phone] AND  PLEASE NOTIFY NUCLEAR MEDICINE AT [facility] AT LEAST 24 HOURS IN ADVANCE IF YOU ARE UNABLE TO KEEP YOUR APPOINTMENT. [phone]  How to prepare for your Myoview test:  1. Do not eat or drink after midnight 2. No caffeine for 24 hours prior to test 3. No smoking 24 hours prior to test. 4. Your medication may be taken with water.  If your doctor stopped a medication because of this test, do not take that medication. 5. Ladies, please do not wear dresses.  Skirts or pants are appropriate. Please wear a short sleeve shirt. 6. No perfume, cologne or lotion. 7. Wear comfortable walking shoes. No heels!    Follow-Up: At [facility], you and your health  needs are our priority.  As part of our continuing mission to provide you with exceptional heart care, we have created designated Provider Care Teams.  These Care Teams include your primary Cardiologist (physician) and Advanced Practice  Providers (APPs -  Physician Assistants and Nurse Practitioners) who all work together to provide you with the care you need, when you need it.  . You will need a follow up appointment in 1 month after echo  . Providers on your designated Care Team:   . Murray Hodgkins, NP . Christell Faith, PA-C . Marrianne Mood, PA-C  Any Other Special Instructions Will Be Listed Below (If Applicable).  COVID-19 Vaccine Information can be found at: [email or web address].uk For questions related to vaccine distribution or appointments, please email [email or web address] or call [phone].     Echocardiogram An echocardiogram is a procedure that uses painless sound waves (ultrasound) to produce an image of the heart. Images from an echocardiogram can provide important information about:  Signs of coronary artery disease (CAD).  Aneurysm detection. An aneurysm is a weak or damaged part of an artery wall that bulges out from the normal force of blood pumping through the body.  Heart size and shape. Changes in the size or shape of the heart can be associated with certain conditions, including heart failure, aneurysm, and CAD.  Heart muscle function.  Heart valve function.  Signs of a past heart attack.  Fluid buildup around the heart.  Thickening of the heart muscle.  A tumor or infectious growth around the heart valves. Tell a health care provider about:  Any allergies you have.  All medicines you are taking, including vitamins, herbs, eye drops, creams, and over-the-counter medicines.  Any blood disorders you have.  Any surgeries you have had.  Any medical conditions you have.  Whether you are pregnant or may be pregnant. What are the risks? Generally, this is a safe procedure. However, problems may occur, including:  Allergic reaction to dye (contrast) that may be used during the procedure. What happens before the  procedure? No specific preparation is needed. You may eat and drink normally. What happens during the procedure?   An IV tube may be inserted into one of your veins.  You may receive contrast through this tube. A contrast is an injection that improves the quality of the pictures from your heart.  A gel will be applied to your chest.  A wand-like tool (transducer) will be moved over your chest. The gel will help to transmit the sound waves from the transducer.  The sound waves will harmlessly bounce off of your heart to allow the heart images to be captured in real-time motion. The images will be recorded on a computer. The procedure may vary among health care providers and hospitals. What happens after the procedure?  You may return to your normal, everyday life, including diet, activities, and medicines, unless your health care provider tells you not to do that. Summary  An echocardiogram is a procedure that uses painless sound waves (ultrasound) to produce an image of the heart.  Images from an echocardiogram can provide important information about the size and shape of your heart, heart muscle function, heart valve function, and fluid buildup around your heart.  You do not need to do anything to prepare before this procedure. You may eat and drink normally.  After the echocardiogram is completed, you may return to your normal, everyday life, unless your health care provider tells you not to do that. This information is  not intended to replace advice given to you by your health care provider. Make sure you discuss any questions you have with your health care provider. Document Revised: 07/20/2018 Document Reviewed: 05/01/2016 Elsevier Patient Education  [place].

## 2020-03-21 ENCOUNTER — Other Ambulatory Visit: Payer: Medicare HMO

## 2020-03-21 ENCOUNTER — Ambulatory Visit
Admission: RE | Admit: 2020-03-21 | Discharge: 2020-03-21 | Disposition: A | Discharge status: Home / Self Care | Payer: Medicare HMO | Source: Ambulatory Visit | Attending: Cardiovascular Disease | Admitting: Cardiovascular Disease

## 2020-03-21 ENCOUNTER — Other Ambulatory Visit: Payer: Self-pay

## 2020-03-21 ENCOUNTER — Ambulatory Visit: Payer: Medicare HMO

## 2020-03-21 DIAGNOSIS — I209 Angina pectoris, unspecified: Secondary | ICD-10-CM | POA: Diagnosis not present

## 2020-03-21 DIAGNOSIS — R0602 Shortness of breath: Secondary | ICD-10-CM | POA: Diagnosis not present

## 2020-03-21 DIAGNOSIS — R0789 Other chest pain: Secondary | ICD-10-CM | POA: Diagnosis not present

## 2020-03-21 LAB — NM MYOCAR MULTI W/SPECT W/WALL MOTION / EF
Estimated workload: 1 METS
Exercise duration (min): 0 min
Exercise duration (sec): 0 s
LV dias vol: 136 mL (ref 62–150)
LV sys vol: 235 mL
MPHR: 145 {beats}/min
Peak HR: 92 {beats}/min
Percent HR: 63 %
Rest HR: 68 {beats}/min
SDS: 8
SRS: 11
SSS: 17
TID: 1.21

## 2020-03-21 MED ORDER — TECHNETIUM TC 99M TETROFOSMIN IV KIT
28.7230 | PACK | Freq: Once | INTRAVENOUS | Status: AC | PRN
  Administered 2020-03-21: 28.723 via INTRAVENOUS

## 2020-03-21 MED ORDER — REGADENOSON 0.4 MG/5ML IV SOLN
0.4000 mg | Freq: Once | INTRAVENOUS | Status: AC
  Administered 2020-03-21: 0.4 mg via INTRAVENOUS

## 2020-03-21 MED ORDER — TECHNETIUM TC 99M TETROFOSMIN IV KIT
10.0000 | PACK | Freq: Once | INTRAVENOUS | Status: AC | PRN
  Administered 2020-03-21: 10.89 via INTRAVENOUS

## 2020-03-24 ENCOUNTER — Other Ambulatory Visit: Payer: Medicare HMO

## 2020-03-24 ENCOUNTER — Telehealth: Payer: Self-pay

## 2020-03-24 NOTE — Telephone Encounter (Signed)
Able to reach pt regarding his recent stress test, Dr. Rockey Situ had a chance to review his results and advised   "Stress test, no ischemia, The test estimated his EF to be very low, Will wait on the echo to verify if this is correct (sometimes stress test can give false low reading )"  Pt verbalized understanding, reports okay with waiting until echo results to see if there needs to be a change of plan, medication change, or intervention, otherwise all questions or concerns were address and no additional concerns at this time. Agreeable to plan, will call back for anything further.

## 2020-03-24 NOTE — Telephone Encounter (Signed)
-----   Message from Minna Merritts, MD sent at 03/23/2020 11:01 AM EST ----- Stress test, no ischemia, The test estimated his EF to be very low, Will wait on the echo to verify if this is correct (sometimes stress test can give false low reading )

## 2020-04-18 ENCOUNTER — Other Ambulatory Visit: Payer: Self-pay

## 2020-04-18 ENCOUNTER — Ambulatory Visit (INDEPENDENT_AMBULATORY_CARE_PROVIDER_SITE_OTHER): Payer: Medicare HMO

## 2020-04-18 DIAGNOSIS — R0602 Shortness of breath: Secondary | ICD-10-CM

## 2020-04-18 DIAGNOSIS — I209 Angina pectoris, unspecified: Secondary | ICD-10-CM | POA: Diagnosis not present

## 2020-04-18 DIAGNOSIS — R0789 Other chest pain: Secondary | ICD-10-CM

## 2020-04-18 LAB — ECHOCARDIOGRAM COMPLETE
AR max vel: 4.45 cm2
AV Area VTI: 4.35 cm2
AV Area mean vel: 4.36 cm2
AV Mean grad: 2 mmHg
AV Peak grad: 3.4 mmHg
Ao pk vel: 0.92 m/s
Area-P 1/2: 4.15 cm2

## 2020-04-18 MED ORDER — PERFLUTREN LIPID MICROSPHERE
1.0000 mL | INTRAVENOUS | Status: AC | PRN
  Administered 2020-04-18: 2 mL via INTRAVENOUS

## 2020-04-21 ENCOUNTER — Telehealth: Payer: Self-pay

## 2020-04-21 NOTE — Telephone Encounter (Signed)
Pt called back, went over his recent  testing, Dr. Rockey Situ had a chance to review his results and advised   "Echocardiogram with normal ejection fraction, no valve disease  Stress test with no significant ischemia  No further cardiac testing  Would recommend regular walking program on a daily basis for conditioning  For worsening symptoms, more invasive testing could be considered"  Anthony Weber please with results, reports has been walking daily w/o issues.  Has no questions or concerns at this time, agreeable to plan to maintain daily walks, will call back for anything further.

## 2020-04-21 NOTE — Telephone Encounter (Signed)
Left message to call back , no DPR on file to leave detail message

## 2020-05-17 NOTE — Progress Notes (Unsigned)
Cardiology Office Note  Date:  05/19/2020   ID:  Anthony Weber, DOB 12/14/1945, MRN [id number]  PCP:  Albina Billet, MD   Chief Complaint  Patient presents with  . Follow-up    1 Month follow up. Medications verbally reviewed with patient.     HPI:  [name] is a 75 year old gentleman with past medical history of Hypertension Morbid obesity Hyperlipidemia Mildly reduced ejection fraction by echocardiogram 2013 EF 45 to 50% Prior pneumonia 2013 Presenting for dyspnea on exertion  Stress test 03/2020, results reviewed on today's visit Pharmacological myocardial perfusion imaging study with no significant ischemia Small region of apical thinning, likely secondary to attenuation artifact Global hypokinesis, EF estimated at 25% No EKG changes concerning for ischemia at peak stress or in recovery. PVCs noted postinfusion CT attenuation correction images with mild aortic atherosclerosis in the arch, coronary calcification  Echocardiogram results reviewed today  to evaluate ejection fraction estimated 50 to 55% EF Grade 2 diastolic dysfunction Moderately dilated left atrium  some muscle ache on pravasatin Willing to stay on cholesterol low  No regular exercise program Denies having excessive leg edema, PND orthopnea Back pain  No exercising Weight is high  No EKG today   PMH:   has a past medical history of Depression, Hypertension, and Hypothyroidism.  PSH:    Past Surgical History:  Procedure Laterality Date  . COLONOSCOPY WITH PROPOFOL N/A 03/03/2015   Procedure: COLONOSCOPY WITH PROPOFOL;  Surgeon: Manya Silvas, MD;  Location: [facility] ENDOSCOPY;  Service: Endoscopy;  Laterality: N/A;  . EYE SURGERY      Current Outpatient Medications  Medication Sig Dispense Refill  . Ascorbic Acid (VITAMIN C) 100 MG tablet Take 100 mg by mouth daily.    Marland Kitchen aspirin 81 MG chewable tablet Chew by mouth.    . cholecalciferol (VITAMIN D) 400 UNITS TABS tablet Take 400 Units by  mouth.    . Cyanocobalamin (VITAMIN B 12 PO) Take by mouth.    . folic acid (FOLVITE) 1 MG tablet Take 1 mg by mouth daily.    . furosemide (LASIX) 20 MG tablet Take 20 mg by mouth daily.    Marland Kitchen ketoconazole (NIZORAL) 2 % shampoo Apply topically.    Marland Kitchen levothyroxine (SYNTHROID, LEVOTHROID) 50 MCG tablet Take 50 mcg by mouth daily before breakfast.    . lisinopril (PRINIVIL,ZESTRIL) 40 MG tablet Take 40 mg by mouth daily.    . polyethylene glycol powder (GLYCOLAX/MIRALAX) 17 GM/SCOOP powder Take as directed for colonoscopy prep.    . pravastatin (PRAVACHOL) 40 MG tablet Take 40 mg by mouth daily.    Marland Kitchen triamcinolone (KENALOG) 0.1 % Apply topically.     No current facility-administered medications for this visit.     Allergies:   Patient has no known allergies.   Social History:  The patient  reports that he has quit smoking. He has quit using smokeless tobacco.  His smokeless tobacco use included chew. He reports that he does not drink alcohol.   Family History:   family history is not on file.    Review of Systems: Review of Systems  Constitutional: Negative.   HENT: Negative.   Respiratory: Positive for shortness of breath.   Cardiovascular: Negative.   Gastrointestinal: Negative.   Musculoskeletal: Negative.   Neurological: Negative.   Psychiatric/Behavioral: Negative.   All other systems reviewed and are negative.    PHYSICAL EXAM: VS:  BP 132/70 (BP Location: Left Arm, Patient Position: Sitting, Cuff Size: Large)   Pulse  74   Ht 5\' 10"  (1.778 m)   Wt (!) 315 lb (142.9 kg)   SpO2 94%   BMI 45.20 kg/m  , BMI Body mass index is 45.2 kg/m. Constitutional:  oriented to person, place, and time. No distress.  HENT:  Head: Grossly normal Eyes:  no discharge. No scleral icterus.  Neck: No JVD, no carotid bruits  Cardiovascular: Regular rate and rhythm, no murmurs appreciated Pulmonary/Chest: Clear to auscultation bilaterally, no wheezes or rails Abdominal: Soft.  no  distension.  no tenderness.  Musculoskeletal: Normal range of motion Neurological:  normal muscle tone. Coordination normal. No atrophy Skin: Skin warm and dry Psychiatric: normal affect, pleasant   Recent Labs: No results found for requested labs within last 8760 hours.    Lipid Panel No results found for: CHOL, HDL, LDLCALC, TRIG    Wt Readings from Last 3 Encounters:  05/19/20 (!) 315 lb (142.9 kg)  03/19/20 (!) 313 lb (142 kg)  03/03/15 300 lb (136.1 kg)     ASSESSMENT AND PLAN:  Problem List Items Addressed This Visit   None   Visit Diagnoses    Angina pectoris ([place])    -  Primary   Relevant Medications   aspirin 81 MG chewable tablet   Morbid obesity ([facility])       Shortness of breath         Shortness of breath Likely multifactorial including conditioning, BMI 45 Stress test with no ischemia Echocardiogram normal ejection fraction On lasix daily  Morbid obesity We have encouraged continued exercise, careful diet management in an effort to lose weight.  Discussed diet in detail with him, lots of snacking at nighttime  Hyperlipidemia CT attenuation correction images with mild aortic atherosclerosis in the arch, coronary calcification Currently on a statin  Essential hypertension Blood pressure is well controlled on today's visit. No changes made to the medications.    Total encounter time more than 25 minutes  Greater than 50% was spent in counseling and coordination of care with the patient    Signed, Esmond Plants, M.D., Ph.D. [place], [place]

## 2020-05-19 ENCOUNTER — Ambulatory Visit: Payer: Medicare HMO | Admitting: Cardiovascular Disease

## 2020-05-19 ENCOUNTER — Other Ambulatory Visit: Payer: Self-pay

## 2020-05-19 ENCOUNTER — Encounter: Payer: Self-pay | Admitting: Cardiovascular Disease

## 2020-05-19 VITALS — BP 132/70 | HR 74 | Ht 70.0 in | Wt 315.0 lb

## 2020-05-19 DIAGNOSIS — I209 Angina pectoris, unspecified: Secondary | ICD-10-CM | POA: Diagnosis not present

## 2020-05-19 DIAGNOSIS — R0602 Shortness of breath: Secondary | ICD-10-CM | POA: Diagnosis not present

## 2020-05-19 NOTE — Patient Instructions (Signed)
Medication Instructions:  No changes  If you need a refill on your cardiac medications before your next appointment, please call your pharmacy.    Lab work: No new labs needed   If you have labs (blood work) drawn today and your tests are completely normal, you will receive your results only by: . MyChart Message (if you have MyChart) OR . A paper copy in the mail If you have any lab test that is abnormal or we need to change your treatment, we will call you to review the results.   Testing/Procedures: No new testing needed   Follow-Up: At [facility], you and your health needs are our priority.  As part of our continuing mission to provide you with exceptional heart care, we have created designated Provider Care Teams.  These Care Teams include your primary Cardiologist (physician) and Advanced Practice Providers (APPs -  Physician Assistants and Nurse Practitioners) who all work together to provide you with the care you need, when you need it.  . You will need a follow up appointment in 12 months  . Providers on your designated Care Team:   . Christopher Berge, NP . Ryan Dunn, PA-C . Jacquelyn Visser, PA-C  Any Other Special Instructions Will Be Listed Below (If Applicable).  COVID-19 Vaccine Information can be found at: [email or web address] For questions related to vaccine distribution or appointments, please email [email or web address] or call [phone].     

## 2020-06-06 ENCOUNTER — Encounter: Payer: Self-pay | Admitting: Cardiovascular Disease

## 2021-01-18 ENCOUNTER — Emergency Department: Payer: Medicare HMO

## 2021-01-18 ENCOUNTER — Inpatient Hospital Stay
Admission: EM | Admit: 2021-01-18 | Discharge: 2021-01-29 | DRG: 286 | Disposition: A | Discharge status: Other Acute Inpatient Hospital | Payer: Medicare HMO | Attending: Internal Medicine | Admitting: Internal Medicine

## 2021-01-18 ENCOUNTER — Other Ambulatory Visit: Payer: Self-pay

## 2021-01-18 ENCOUNTER — Encounter: Payer: Self-pay | Admitting: Family Medicine

## 2021-01-18 DIAGNOSIS — G928 Other toxic encephalopathy: Secondary | ICD-10-CM | POA: Diagnosis not present

## 2021-01-18 DIAGNOSIS — R601 Generalized edema: Secondary | ICD-10-CM

## 2021-01-18 DIAGNOSIS — Z6841 Body Mass Index (BMI) 40.0 and over, adult: Secondary | ICD-10-CM | POA: Diagnosis not present

## 2021-01-18 DIAGNOSIS — I493 Ventricular premature depolarization: Secondary | ICD-10-CM | POA: Diagnosis not present

## 2021-01-18 DIAGNOSIS — Z7982 Long term (current) use of aspirin: Secondary | ICD-10-CM | POA: Diagnosis not present

## 2021-01-18 DIAGNOSIS — I5033 Acute on chronic diastolic (congestive) heart failure: Secondary | ICD-10-CM | POA: Diagnosis not present

## 2021-01-18 DIAGNOSIS — R570 Cardiogenic shock: Secondary | ICD-10-CM | POA: Diagnosis not present

## 2021-01-18 DIAGNOSIS — J9601 Acute respiratory failure with hypoxia: Secondary | ICD-10-CM | POA: Diagnosis present

## 2021-01-18 DIAGNOSIS — I420 Dilated cardiomyopathy: Secondary | ICD-10-CM | POA: Diagnosis present

## 2021-01-18 DIAGNOSIS — E8809 Other disorders of plasma-protein metabolism, not elsewhere classified: Secondary | ICD-10-CM | POA: Diagnosis present

## 2021-01-18 DIAGNOSIS — R600 Localized edema: Secondary | ICD-10-CM | POA: Diagnosis not present

## 2021-01-18 DIAGNOSIS — Z20822 Contact with and (suspected) exposure to covid-19: Secondary | ICD-10-CM | POA: Diagnosis present

## 2021-01-18 DIAGNOSIS — A419 Sepsis, unspecified organism: Secondary | ICD-10-CM | POA: Diagnosis not present

## 2021-01-18 DIAGNOSIS — E871 Hypo-osmolality and hyponatremia: Secondary | ICD-10-CM | POA: Diagnosis not present

## 2021-01-18 DIAGNOSIS — I472 Ventricular tachycardia, unspecified: Secondary | ICD-10-CM | POA: Diagnosis not present

## 2021-01-18 DIAGNOSIS — F32A Depression, unspecified: Secondary | ICD-10-CM | POA: Diagnosis present

## 2021-01-18 DIAGNOSIS — I509 Heart failure, unspecified: Secondary | ICD-10-CM

## 2021-01-18 DIAGNOSIS — I110 Hypertensive heart disease with heart failure: Principal | ICD-10-CM | POA: Diagnosis present

## 2021-01-18 DIAGNOSIS — E039 Hypothyroidism, unspecified: Secondary | ICD-10-CM | POA: Diagnosis present

## 2021-01-18 DIAGNOSIS — I2510 Atherosclerotic heart disease of native coronary artery without angina pectoris: Secondary | ICD-10-CM | POA: Diagnosis present

## 2021-01-18 DIAGNOSIS — I5021 Acute systolic (congestive) heart failure: Secondary | ICD-10-CM

## 2021-01-18 DIAGNOSIS — F419 Anxiety disorder, unspecified: Secondary | ICD-10-CM | POA: Diagnosis present

## 2021-01-18 DIAGNOSIS — I429 Cardiomyopathy, unspecified: Secondary | ICD-10-CM | POA: Diagnosis not present

## 2021-01-18 DIAGNOSIS — R5381 Other malaise: Secondary | ICD-10-CM | POA: Diagnosis present

## 2021-01-18 DIAGNOSIS — I5023 Acute on chronic systolic (congestive) heart failure: Secondary | ICD-10-CM | POA: Diagnosis present

## 2021-01-18 DIAGNOSIS — I5031 Acute diastolic (congestive) heart failure: Secondary | ICD-10-CM | POA: Diagnosis not present

## 2021-01-18 DIAGNOSIS — R6521 Severe sepsis with septic shock: Secondary | ICD-10-CM | POA: Diagnosis not present

## 2021-01-18 DIAGNOSIS — I340 Nonrheumatic mitral (valve) insufficiency: Secondary | ICD-10-CM | POA: Diagnosis not present

## 2021-01-18 DIAGNOSIS — E876 Hypokalemia: Secondary | ICD-10-CM | POA: Diagnosis present

## 2021-01-18 DIAGNOSIS — T80219D Unspecified infection due to central venous catheter, subsequent encounter: Secondary | ICD-10-CM | POA: Diagnosis not present

## 2021-01-18 DIAGNOSIS — B9561 Methicillin susceptible Staphylococcus aureus infection as the cause of diseases classified elsewhere: Secondary | ICD-10-CM | POA: Diagnosis not present

## 2021-01-18 DIAGNOSIS — Z452 Encounter for adjustment and management of vascular access device: Secondary | ICD-10-CM

## 2021-01-18 DIAGNOSIS — I5041 Acute combined systolic (congestive) and diastolic (congestive) heart failure: Secondary | ICD-10-CM | POA: Diagnosis not present

## 2021-01-18 DIAGNOSIS — J9622 Acute and chronic respiratory failure with hypercapnia: Secondary | ICD-10-CM | POA: Diagnosis not present

## 2021-01-18 DIAGNOSIS — I5020 Unspecified systolic (congestive) heart failure: Secondary | ICD-10-CM

## 2021-01-18 DIAGNOSIS — Z87891 Personal history of nicotine dependence: Secondary | ICD-10-CM

## 2021-01-18 DIAGNOSIS — R0600 Dyspnea, unspecified: Secondary | ICD-10-CM

## 2021-01-18 DIAGNOSIS — I10 Essential (primary) hypertension: Secondary | ICD-10-CM

## 2021-01-18 DIAGNOSIS — Z79890 Hormone replacement therapy (postmenopausal): Secondary | ICD-10-CM

## 2021-01-18 DIAGNOSIS — Z7189 Other specified counseling: Secondary | ICD-10-CM | POA: Diagnosis not present

## 2021-01-18 DIAGNOSIS — J15211 Pneumonia due to Methicillin susceptible Staphylococcus aureus: Secondary | ICD-10-CM | POA: Diagnosis not present

## 2021-01-18 DIAGNOSIS — N179 Acute kidney failure, unspecified: Secondary | ICD-10-CM | POA: Diagnosis not present

## 2021-01-18 DIAGNOSIS — G9341 Metabolic encephalopathy: Secondary | ICD-10-CM | POA: Diagnosis not present

## 2021-01-18 DIAGNOSIS — E785 Hyperlipidemia, unspecified: Secondary | ICD-10-CM | POA: Diagnosis present

## 2021-01-18 DIAGNOSIS — R7881 Bacteremia: Secondary | ICD-10-CM | POA: Diagnosis not present

## 2021-01-18 DIAGNOSIS — R0603 Acute respiratory distress: Secondary | ICD-10-CM | POA: Diagnosis not present

## 2021-01-18 DIAGNOSIS — J9602 Acute respiratory failure with hypercapnia: Secondary | ICD-10-CM | POA: Diagnosis not present

## 2021-01-18 DIAGNOSIS — Z79899 Other long term (current) drug therapy: Secondary | ICD-10-CM

## 2021-01-18 DIAGNOSIS — J9621 Acute and chronic respiratory failure with hypoxia: Secondary | ICD-10-CM | POA: Diagnosis not present

## 2021-01-18 DIAGNOSIS — J189 Pneumonia, unspecified organism: Secondary | ICD-10-CM | POA: Diagnosis not present

## 2021-01-18 DIAGNOSIS — G9340 Encephalopathy, unspecified: Secondary | ICD-10-CM | POA: Diagnosis not present

## 2021-01-18 DIAGNOSIS — Z713 Dietary counseling and surveillance: Secondary | ICD-10-CM

## 2021-01-18 DIAGNOSIS — I5043 Acute on chronic combined systolic (congestive) and diastolic (congestive) heart failure: Secondary | ICD-10-CM | POA: Diagnosis not present

## 2021-01-18 LAB — COMPREHENSIVE METABOLIC PANEL
ALT: 17 U/L (ref 0–44)
AST: 21 U/L (ref 15–41)
Albumin: 3.4 g/dL — ABNORMAL LOW (ref 3.5–5.0)
Alkaline Phosphatase: 41 U/L (ref 38–126)
Anion gap: 5 (ref 5–15)
BUN: 18 mg/dL (ref 8–23)
CO2: 31 mmol/L (ref 22–32)
Calcium: 8.7 mg/dL — ABNORMAL LOW (ref 8.9–10.3)
Chloride: 100 mmol/L (ref 98–111)
Creatinine, Ser: 0.8 mg/dL (ref 0.61–1.24)
GFR, Estimated: >60 mL/min (ref >60)
Glucose, Bld: 103 mg/dL — ABNORMAL HIGH (ref 70–99)
Potassium: 4.9 mmol/L (ref 3.5–5.1)
Sodium: 136 mmol/L (ref 135–145)
Total Bilirubin: 1 mg/dL (ref 0.3–1.2)
Total Protein: 6.2 g/dL — ABNORMAL LOW (ref 6.5–8.1)

## 2021-01-18 LAB — CBC
HCT: 42.1 % (ref 39.0–52.0)
Hemoglobin: 13.4 g/dL (ref 13.0–17.0)
MCH: 29.5 pg (ref 26.0–34.0)
MCHC: 31.8 g/dL (ref 30.0–36.0)
MCV: 92.7 fL (ref 80.0–100.0)
Platelets: 151 10*3/uL (ref 150–400)
RBC: 4.54 MIL/uL (ref 4.22–5.81)
RDW: 15.1 % (ref 11.5–15.5)
WBC: 8.4 10*3/uL (ref 4.0–10.5)
nRBC: 0 % (ref 0.0–0.2)

## 2021-01-18 LAB — RESP PANEL BY RT-PCR (FLU A&B, COVID) ARPGX2
Influenza A by PCR: NEGATIVE
Influenza B by PCR: NEGATIVE
SARS Coronavirus 2 by RT PCR: NEGATIVE

## 2021-01-18 LAB — BRAIN NATRIURETIC PEPTIDE: B Natriuretic Peptide: 951 pg/mL — ABNORMAL HIGH (ref 0.0–100.0)

## 2021-01-18 LAB — TROPONIN I (HIGH SENSITIVITY): Troponin I (High Sensitivity): 20 ng/L — ABNORMAL HIGH (ref <18)

## 2021-01-18 MED ORDER — ENOXAPARIN SODIUM 40 MG/0.4ML IJ SOSY: 40.0000 mg | PREFILLED_SYRINGE | INTRAMUSCULAR | Status: DC

## 2021-01-18 MED ORDER — FUROSEMIDE 10 MG/ML IJ SOLN
40.0000 mg | Freq: Two times a day (BID) | INTRAMUSCULAR | Status: DC
  Administered 2021-01-19 – 2021-01-23 (×9): 40 mg via INTRAVENOUS
  Filled 2021-01-18 (×9): qty 4

## 2021-01-18 MED ORDER — MAGNESIUM HYDROXIDE 400 MG/5ML PO SUSP: 30.0000 mL | Freq: Every day | ORAL | Status: DC | PRN

## 2021-01-18 MED ORDER — ENOXAPARIN SODIUM 80 MG/0.8ML IJ SOSY
0.5000 mg/kg | PREFILLED_SYRINGE | INTRAMUSCULAR | Status: DC
  Administered 2021-01-18 – 2021-01-19 (×2): 77.5 mg via SUBCUTANEOUS
  Filled 2021-01-18 (×2): qty 0.8
  Filled 2021-01-18: qty 0.78

## 2021-01-18 MED ORDER — ACETAMINOPHEN 650 MG RE SUPP
650.0000 mg | Freq: Four times a day (QID) | RECTAL | Status: DC | PRN
  Filled 2021-01-18: qty 1

## 2021-01-18 MED ORDER — LEVOTHYROXINE SODIUM 50 MCG PO TABS
50.0000 ug | ORAL_TABLET | Freq: Every day | ORAL | Status: DC
  Administered 2021-01-19 – 2021-01-27 (×9): 50 ug via ORAL
  Filled 2021-01-18 (×9): qty 1

## 2021-01-18 MED ORDER — TRAZODONE HCL 50 MG PO TABS
25.0000 mg | ORAL_TABLET | Freq: Every evening | ORAL | Status: DC | PRN
  Administered 2021-01-18: 25 mg via ORAL
  Filled 2021-01-18: qty 1

## 2021-01-18 MED ORDER — LISINOPRIL 10 MG PO TABS
40.0000 mg | ORAL_TABLET | Freq: Every day | ORAL | Status: DC
  Administered 2021-01-19: 40 mg via ORAL
  Filled 2021-01-18: qty 4

## 2021-01-18 MED ORDER — PRAVASTATIN SODIUM 20 MG PO TABS
40.0000 mg | ORAL_TABLET | Freq: Every day | ORAL | Status: DC
  Administered 2021-01-19 – 2021-01-29 (×11): 40 mg via ORAL
  Filled 2021-01-18 (×11): qty 2

## 2021-01-18 MED ORDER — ACETAMINOPHEN 325 MG PO TABS: 650.0000 mg | ORAL_TABLET | Freq: Four times a day (QID) | ORAL | Status: DC | PRN

## 2021-01-18 MED ORDER — FUROSEMIDE 10 MG/ML IJ SOLN
40.0000 mg | Freq: Once | INTRAMUSCULAR | Status: AC
  Administered 2021-01-18: 40 mg via INTRAVENOUS
  Filled 2021-01-18: qty 4

## 2021-01-18 MED ORDER — ONDANSETRON HCL 4 MG/2ML IJ SOLN: 4.0000 mg | Freq: Four times a day (QID) | INTRAMUSCULAR | Status: DC | PRN

## 2021-01-18 MED ORDER — ASPIRIN 81 MG PO CHEW
81.0000 mg | CHEWABLE_TABLET | Freq: Every day | ORAL | Status: DC
  Administered 2021-01-18 – 2021-01-29 (×11): 81 mg via ORAL
  Filled 2021-01-18 (×11): qty 1

## 2021-01-18 MED ORDER — ONDANSETRON HCL 4 MG PO TABS: 4.0000 mg | ORAL_TABLET | Freq: Four times a day (QID) | ORAL | Status: DC | PRN

## 2021-01-18 NOTE — ED Notes (Addendum)
Report received from Julia, RN 

## 2021-01-18 NOTE — ED Notes (Signed)
Pt min assisted to edge of bed to urinate in urinal

## 2021-01-18 NOTE — Progress Notes (Signed)
PHARMACIST - PHYSICIAN COMMUNICATION  CONCERNING:  Enoxaparin (Lovenox) for DVT Prophylaxis    RECOMMENDATION: Patient was prescribed enoxaprin 40mg  q24 hours for VTE prophylaxis.   Filed Weights   01/18/21 1640  Weight: (!) 154.2 kg (340 lb)    Body mass index is 48.78 kg/m.  Estimated Creatinine Clearance: 117.2 mL/min (by C-G formula based on SCr of 0.8 mg/dL).   Based on [place] patient is candidate for enoxaparin 0.5mg /kg TBW SQ every 24 hours based on BMI being >30.   DESCRIPTION: Pharmacy has adjusted enoxaparin dose per [facility] policy.  Patient is now receiving enoxaparin 77.5 mg every 24 hours    Stephenia Vogan Rodriguez-Guzman PharmD, BCPS 01/18/2021 8:07 PM

## 2021-01-18 NOTE — H&P (Signed)
[place]   PATIENT NAME: Gen Clagg    MR#:  [id number]  DATE OF BIRTH:  [birth year]  DATE OF ADMISSION:  01/18/2021  PRIMARY CARE PHYSICIAN: Albina Billet, MD   Patient is coming from: Home  REQUESTING/REFERRING PHYSICIAN: Lavonia Drafts, MD  CHIEF COMPLAINT:   Chief Complaint  Patient presents with   Leg Swelling    Fluid retention    HISTORY OF PRESENT ILLNESS:  Jesusmanuel Corderius Saraceni is a 75 y.o. Caucasian male with medical history significant for depression, hypertension, diastolic CHF and hypothyroidism, presented to the emergency room with acute onset of worsening lower extremity edema with associated abdominal distention, recent dyspnea at rest and on exertion.  He admitted to orthopnea and paroxysmal nocturnal dyspnea.  He gained 15 pounds over the last couple of weeks.  No fever or chills.  He denies any cough or wheezing.  No dysuria, oliguria or hematuria or flank pain.  No chest pain or palpitations.  No bleeding diathesis.   ED Course: Upon presentation to the ER vital signs were within normal.  Labs revealed a CMP remarkable for albumin of 3.4 with total protein 6.2.  BNP was 951 and high-sensitivity troponin I was 20.  CBC was within normal.  Influenza antigens and COVID-19 PCR came back negative. EKG as reviewed by me : EKG showed normal sinus rhythm with a rate of 86 with poor R wave progression and low voltage QRS. Imaging: Chest x-ray showed borderline cardiomegaly and vascular congestion with left lower lobe atelectasis or infiltrates with small left pleural effusion.  The patient was given 40 mg of IV Lasix.  He will be admitted to a PCU bed for further evaluation and management.   PAST MEDICAL HISTORY:   Past Medical History:  Diagnosis Date   Depression    Hypertension    Hypothyroidism     PAST SURGICAL HISTORY:   Past Surgical History:  Procedure Laterality Date   COLONOSCOPY WITH PROPOFOL N/A 03/03/2015   Procedure: COLONOSCOPY WITH  PROPOFOL;  Surgeon: Manya Silvas, MD;  Location: [facility];  Service: Endoscopy;  Laterality: N/A;   EYE SURGERY      SOCIAL HISTORY:   Social History   Tobacco Use   Smoking status: Former   Smokeless tobacco: Former    Types: Chew  Substance Use Topics   Alcohol use: No    FAMILY HISTORY:  No family history on file.  No pertinent familial diseases reported.  DRUG ALLERGIES:  No Known Allergies  REVIEW OF SYSTEMS:   ROS As per history of present illness. All pertinent systems were reviewed above. Constitutional, HEENT, cardiovascular, respiratory, GI, GU, musculoskeletal, neuro, psychiatric, endocrine, integumentary and hematologic systems were reviewed and are otherwise negative/unremarkable except for positive findings mentioned above in the HPI.   MEDICATIONS AT HOME:   Prior to Admission medications   Medication Sig Start Date End Date Taking? Authorizing Provider  Ascorbic Acid (VITAMIN C) 100 MG tablet Take 100 mg by mouth daily.    [provider]  aspirin 81 MG chewable tablet Chew by mouth.    [provider]  cholecalciferol (VITAMIN D) 400 UNITS TABS tablet Take 400 Units by mouth.    [provider]  Cyanocobalamin (VITAMIN B 12 PO) Take by mouth.    [provider]  folic acid (FOLVITE) 1 MG tablet Take 1 mg by mouth daily.    [provider]  furosemide (LASIX) 20 MG tablet Take 20 mg  by mouth daily.    [provider]  ketoconazole (NIZORAL) 2 % shampoo Apply topically. 03/18/20   [provider]  levothyroxine (SYNTHROID, LEVOTHROID) 50 MCG tablet Take 50 mcg by mouth daily before breakfast.    [provider]  lisinopril (PRINIVIL,ZESTRIL) 40 MG tablet Take 40 mg by mouth daily.    [provider]  polyethylene glycol powder (GLYCOLAX/MIRALAX) 17 GM/SCOOP powder Take as directed for colonoscopy prep. 01/14/15   [provider]  pravastatin (PRAVACHOL) 40 MG  tablet Take 40 mg by mouth daily.    [provider]  triamcinolone (KENALOG) 0.1 % Apply topically. 01/08/20   [provider]      VITAL SIGNS:  Blood pressure 120/64, pulse 81, temperature 98 F (36.7 C), temperature source Oral, resp. rate 17, height 5\' 10"  (1.778 m), weight (!) 154.2 kg, SpO2 97 %.  PHYSICAL EXAMINATION:  Physical Exam  GENERAL:  75 y.o.-year-old obese Caucasian male patient lying in the bed with mild conversational dyspnea. EYES: Pupils equal, round, reactive to light and accommodation. No scleral icterus. Extraocular muscles intact.  HEENT: Head atraumatic, normocephalic. Oropharynx and nasopharynx clear.  NECK:  Supple, no jugular venous distention. No thyroid enlargement, no tenderness.  LUNGS: Diminished bibasilar breath sounds with bibasal rales. CARDIOVASCULAR: Regular rate and rhythm, S1, S2 normal. No murmurs, rubs, or gallops.  ABDOMEN: Soft, distended with mild shifting dullness, nontender. Bowel sounds present. No organomegaly or mass.  EXTREMITIES: 3+ bilateral lower extremity pitting edema with no cyanosis, or clubbing.  NEUROLOGIC: Cranial nerves II through XII are intact. Muscle strength 5/5 in all extremities. Sensation intact. Gait not checked.  PSYCHIATRIC: The patient is alert and oriented x 3.  Normal affect and good eye contact. SKIN: No obvious rash, lesion, or ulcer.   LABORATORY PANEL:   CBC Recent Labs  Lab 01/18/21 1654  WBC 8.4  HGB 13.4  HCT 42.1  PLT 151   ------------------------------------------------------------------------------------------------------------------  Chemistries  Recent Labs  Lab 01/18/21 1654  NA 136  K 4.9  CL 100  CO2 31  GLUCOSE 103*  BUN 18  CREATININE 0.80  CALCIUM 8.7*  AST 21  ALT 17  ALKPHOS 41  BILITOT 1.0   ------------------------------------------------------------------------------------------------------------------  Cardiac Enzymes No results for input(s):  TROPONINI in the last 168 hours. ------------------------------------------------------------------------------------------------------------------  RADIOLOGY:  DG Chest Port 1 View  Result Date: 01/18/2021 CLINICAL DATA:  Shortness of breath, leg swelling EXAM: PORTABLE CHEST 1 VIEW COMPARISON:  06/23/2011 FINDINGS: Mild cardiomegaly, vascular congestion. Left basilar atelectasis or infiltrate. Suspect small left effusion. No overt edema. No acute bony abnormality. IMPRESSION: Borderline cardiomegaly, vascular congestion. Left lower lobe atelectasis or infiltrate.  Small left effusion. Electronically Signed   By: Rolm Baptise M.D.   On: 01/18/2021 17:09      IMPRESSION AND PLAN:  Active Problems:   Acute CHF (congestive heart failure) ([place])  1.  Acute on chronic diastolic CHF. - The patient will be admitted to a PCU bed. - We will continue diuresis with IV Lasix. - We will follow serial troponins. - Cardiology consult will be obtained. - I notified Dr. Garen Lah about the patient. - His last 2D echo this year showed an EF of 50 to 55% with grade 2 diastolic dysfunction and moderate left atrial dilatation.  2.  Anasarca as manifested by abdominal distention and ascites and pleural effusion. - We will continue diuresis as mentioned above. - This could partly be related to mild hypoalbuminemia. - We will follow ins and  outs with diuresis.  3.  Hypothyroidism. - We will continue Synthroid. - Given significant weight gain we will obtain a TSH level.  4.  Essential hypertension. - We will continue Zestril.  5.  Dyslipidemia. - We will continue statin therapy.  DVT prophylaxis: Lovenox. Code Status: full code. Family Communication:  The plan of care was discussed in details with the patient (and family). I answered all questions. The patient agreed to proceed with the above mentioned plan. Further management will depend upon hospital course. Disposition Plan: Back to previous  home environment Consults called: Cardiology. All the records are reviewed and case discussed with ED provider.  Status is: Inpatient  Remains inpatient appropriate because:Ongoing diagnostic testing needed not appropriate for outpatient work up, Unsafe d/c plan, IV treatments appropriate due to intensity of illness or inability to take PO, and Inpatient level of care appropriate due to severity of illness  Dispo: The patient is from: Home              Anticipated d/c is to: Home              Patient currently is not medically stable to d/c.   Difficult to place patient No   TOTAL TIME TAKING CARE OF THIS PATIENT: 55 minutes.    Christel Mormon M.D on 01/18/2021 at 8:08 PM  [facility]   From 7 PM-7 AM, contact night-coverage [email or web address]  CC: Primary care physician; Albina Billet, MD

## 2021-01-18 NOTE — ED Notes (Signed)
Pt resting in bed asking for xray results.

## 2021-01-18 NOTE — ED Provider Notes (Signed)
[facility] Emergency Department Provider Note   ____________________________________________    I have reviewed the triage vital signs and the nursing notes.   HISTORY  Chief Complaint Leg Swelling (Fluid retention)     HPI Anthony Weber is a 75 y.o. male with history as noted below who presents with complaints of shortness of breath with exertion and significant leg swelling over the last 2 weeks.  Patient reports that he has been compliant with his Lasix, he takes 20 mg once a day.  He does see Dr. Rockey Situ of cardiology.  He reports over the last 2 weeks significant fluid buildup in his lower extremities as well as possibly his abdomen.  He reports he is gotten short of breath with exertion as well this week.  Denies chest pain.  No fevers chills or cough.  Past Medical History:  Diagnosis Date   Depression    Hypertension    Hypothyroidism     There are no problems to display for this patient.   Past Surgical History:  Procedure Laterality Date   COLONOSCOPY WITH PROPOFOL N/A 03/03/2015   Procedure: COLONOSCOPY WITH PROPOFOL;  Surgeon: Manya Silvas, MD;  Location: [facility] ENDOSCOPY;  Service: Endoscopy;  Laterality: N/A;   EYE SURGERY      Prior to Admission medications   Medication Sig Start Date End Date Taking? Authorizing Provider  Ascorbic Acid (VITAMIN C) 100 MG tablet Take 100 mg by mouth daily.    [provider]  aspirin 81 MG chewable tablet Chew by mouth.    [provider]  cholecalciferol (VITAMIN D) 400 UNITS TABS tablet Take 400 Units by mouth.    [provider]  Cyanocobalamin (VITAMIN B 12 PO) Take by mouth.    [provider]  folic acid (FOLVITE) 1 MG tablet Take 1 mg by mouth daily.    [provider]  furosemide (LASIX) 20 MG tablet Take 20 mg by mouth daily.    [provider]  ketoconazole (NIZORAL) 2 % shampoo Apply topically. 03/18/20   [provider]  levothyroxine (SYNTHROID, LEVOTHROID) 50 MCG tablet Take 50 mcg by mouth daily before breakfast.    [provider]  lisinopril (PRINIVIL,ZESTRIL) 40 MG tablet Take 40 mg by mouth daily.    [provider]  polyethylene glycol powder (GLYCOLAX/MIRALAX) 17 GM/SCOOP powder Take as directed for colonoscopy prep. 01/14/15   [provider]  pravastatin (PRAVACHOL) 40 MG tablet Take 40 mg by mouth daily.    [provider]  triamcinolone (KENALOG) 0.1 % Apply topically. 01/08/20   [provider]     Allergies Patient has no known allergies.  No family history on file.  Social History Social History   Tobacco Use   Smoking status: Former   Smokeless tobacco: Former    Types: Chew  Substance Use Topics   Alcohol use: No    Review of Systems  Constitutional: No fever/chills Eyes: No visual changes.  ENT: No sore throat. Cardiovascular: Denies chest pain. Respiratory: As above Gastrointestinal: No abdominal pain.  No nausea, no vomiting.   Genitourinary: Negative for dysuria. Musculoskeletal: As above Skin: Negative for rash. Neurological: Negative for headaches or weakness   ____________________________________________   PHYSICAL EXAM:  VITAL SIGNS: ED Triage Vitals  Enc Vitals Group     BP 01/18/21 1643 137/76     Pulse Rate 01/18/21 1640 91     Resp 01/18/21 1643 20     Temp  01/18/21 1643 98.2 F (36.8 C)     Temp Source 01/18/21 1643 Oral     SpO2 01/18/21 1643 97 %     Weight 01/18/21 1640 (!) 154.2 kg (340 lb)     Height 01/18/21 1640 1.778 m (5\' 10" )     Head Circumference --      Peak Flow --      Pain Score 01/18/21 1640 0     Pain Loc --      Pain Edu? --      Excl. in [place]? --     Constitutional: Alert and oriented.  Eyes: Conjunctivae are normal.   Mouth/Throat: Mucous membranes are moist.   Neck:  Painless ROM Cardiovascular: Normal rate, regular rhythm. Grossly normal heart sounds.  Good  peripheral circulation. Respiratory: Normal respiratory effort.  No retractions.  Bibasilar Rales Gastrointestinal: Soft and nontender.    Musculoskeletal: Lower extremity edema bilaterally.  2/3+.  Warm and well perfused Neurologic:  Normal speech and language. No gross focal neurologic deficits are appreciated.  Skin:  Skin is warm, dry and intact. Psychiatric: Mood and affect are normal. Speech and behavior are normal.  ____________________________________________   LABS (all labs ordered are listed, but only abnormal results are displayed)  Labs Reviewed  COMPREHENSIVE METABOLIC PANEL - Abnormal; Notable for the following components:      Result Value   Glucose, Bld 103 (*)    Calcium 8.7 (*)    Total Protein 6.2 (*)    Albumin 3.4 (*)    All other components within normal limits  BRAIN NATRIURETIC PEPTIDE - Abnormal; Notable for the following components:   B Natriuretic Peptide 951.0 (*)    All other components within normal limits  TROPONIN I (HIGH SENSITIVITY) - Abnormal; Notable for the following components:   Troponin I (High Sensitivity) 20 (*)    All other components within normal limits  RESP PANEL BY RT-PCR (FLU A&B, COVID) ARPGX2  CBC   ____________________________________________  EKG  ED ECG REPORT I, Lavonia Drafts, the attending physician, personally viewed and interpreted this ECG.  Date: 01/18/2021  Rhythm: normal sinus rhythm QRS Axis: normal Intervals: normal ST/T Wave abnormalities: normal Narrative Interpretation: no evidence of acute ischemia  ____________________________________________  RADIOLOGY  Chest x-ray demonstrates pulmonary vascular congestion effusion ____________________________________________   PROCEDURES  Procedure(s) performed: No  Procedures   Critical Care performed: No ____________________________________________   INITIAL IMPRESSION / ASSESSMENT AND PLAN / ED COURSE  Pertinent labs & imaging results that  were available during my care of the patient were reviewed by me and considered in my medical decision making (see chart for details).   Patient presents with worsening lower extremity edema, shortness of breath with exertion.  Reviewed medical records patient reportedly has diastolic dysfunction, last saw cardiology in February 2022, reported normal EF at that time.  Strongly suspicious for CHF exacerbation given bilateral lower extremity edema.  Pending labs  ----------------------------------------- 7:00 PM on 01/18/2021 ----------------------------------------- Lab work notable for elevated BNP, mildly elevated troponin.  Patient has been given IV Lasix 40 mg.  He is significantly volume overloaded and will require IV treatment for diuresis, have consulted the hospitalist service    ____________________________________________   FINAL CLINICAL IMPRESSION(S) / ED DIAGNOSES  Final diagnoses:  Acute on chronic diastolic congestive heart failure ([place])  Bilateral lower extremity edema        Note:  This document was prepared using Dragon voice recognition software and may include unintentional dictation errors.  Lavonia Drafts, MD 01/18/21 1900

## 2021-01-18 NOTE — ED Notes (Signed)
Patient transported to room 247 via wheelchair by RN, stable at transfer.

## 2021-01-19 ENCOUNTER — Inpatient Hospital Stay (HOSPITAL_COMMUNITY)
Admit: 2021-01-19 | Discharge: 2021-01-19 | Disposition: A | Discharge status: Home / Self Care | Payer: Medicare HMO | Attending: Physician Assistant | Admitting: Physician Assistant

## 2021-01-19 DIAGNOSIS — I5033 Acute on chronic diastolic (congestive) heart failure: Secondary | ICD-10-CM | POA: Diagnosis not present

## 2021-01-19 DIAGNOSIS — I5023 Acute on chronic systolic (congestive) heart failure: Secondary | ICD-10-CM

## 2021-01-19 DIAGNOSIS — I5031 Acute diastolic (congestive) heart failure: Secondary | ICD-10-CM

## 2021-01-19 LAB — CBC
HCT: 39.4 % (ref 39.0–52.0)
Hemoglobin: 12.9 g/dL — ABNORMAL LOW (ref 13.0–17.0)
MCH: 30.3 pg (ref 26.0–34.0)
MCHC: 32.7 g/dL (ref 30.0–36.0)
MCV: 92.5 fL (ref 80.0–100.0)
Platelets: 129 10*3/uL — ABNORMAL LOW (ref 150–400)
RBC: 4.26 MIL/uL (ref 4.22–5.81)
RDW: 15.1 % (ref 11.5–15.5)
WBC: 8.9 10*3/uL (ref 4.0–10.5)
nRBC: 0 % (ref 0.0–0.2)

## 2021-01-19 LAB — LIPID PANEL
Cholesterol: 101 mg/dL (ref 0–200)
HDL: 40 mg/dL — ABNORMAL LOW (ref >40)
LDL Cholesterol: 51 mg/dL (ref 0–99)
Total CHOL/HDL Ratio: 2.5 RATIO
Triglycerides: 49 mg/dL (ref <150)
VLDL: 10 mg/dL (ref 0–40)

## 2021-01-19 LAB — ECHOCARDIOGRAM COMPLETE
AV Mean grad: 1 mmHg
AV Peak grad: 1.4 mmHg
Ao pk vel: 0.59 m/s
Area-P 1/2: 4.89 cm2
Height: 70 in
S' Lateral: 4.67 cm
Weight: 5537.6 oz

## 2021-01-19 LAB — BASIC METABOLIC PANEL
Anion gap: 6 (ref 5–15)
BUN: 19 mg/dL (ref 8–23)
CO2: 31 mmol/L (ref 22–32)
Calcium: 8.9 mg/dL (ref 8.9–10.3)
Chloride: 100 mmol/L (ref 98–111)
Creatinine, Ser: 0.77 mg/dL (ref 0.61–1.24)
GFR, Estimated: >60 mL/min (ref >60)
Glucose, Bld: 115 mg/dL — ABNORMAL HIGH (ref 70–99)
Potassium: 4 mmol/L (ref 3.5–5.1)
Sodium: 137 mmol/L (ref 135–145)

## 2021-01-19 LAB — TSH: TSH: 9.059 u[IU]/mL — ABNORMAL HIGH (ref 0.350–4.500)

## 2021-01-19 LAB — BRAIN NATRIURETIC PEPTIDE: B Natriuretic Peptide: 942.4 pg/mL — ABNORMAL HIGH (ref 0.0–100.0)

## 2021-01-19 LAB — TROPONIN I (HIGH SENSITIVITY): Troponin I (High Sensitivity): 22 ng/L — ABNORMAL HIGH (ref <18)

## 2021-01-19 MED ORDER — MELATONIN 5 MG PO TABS
5.0000 mg | ORAL_TABLET | Freq: Every day | ORAL | Status: DC
  Administered 2021-01-19 – 2021-01-29 (×8): 5 mg via ORAL
  Filled 2021-01-19 (×10): qty 1

## 2021-01-19 MED ORDER — CARVEDILOL 3.125 MG PO TABS
3.1250 mg | ORAL_TABLET | Freq: Two times a day (BID) | ORAL | Status: DC
  Administered 2021-01-19 – 2021-01-26 (×13): 3.125 mg via ORAL
  Filled 2021-01-19 (×13): qty 1

## 2021-01-19 MED ORDER — PERFLUTREN LIPID MICROSPHERE
1.0000 mL | INTRAVENOUS | Status: AC | PRN
  Administered 2021-01-19: 2 mL via INTRAVENOUS
  Filled 2021-01-19: qty 10

## 2021-01-19 MED ORDER — DIPHENHYDRAMINE HCL 25 MG PO CAPS: 25.0000 mg | ORAL_CAPSULE | Freq: Every evening | ORAL | Status: DC | PRN

## 2021-01-19 MED ORDER — LOSARTAN POTASSIUM 50 MG PO TABS
50.0000 mg | ORAL_TABLET | Freq: Every day | ORAL | Status: DC
  Administered 2021-01-20 – 2021-01-27 (×7): 50 mg via ORAL
  Filled 2021-01-19 (×7): qty 1

## 2021-01-19 NOTE — Consult Note (Signed)
Cardiology Consultation:   Patient ID: Najee Manninen MRN: [id number]; DOB: 04-Sep-1945  Admit date: 01/18/2021 Date of Consult: 01/19/2021  PCP:  Albina Billet, MD   [place]  Cardiologist:  Dr. Rockey Situ Advanced Practice Provider:  No care team member to display Electrophysiologist:  None 46}    Patient Profile:   Jaymir Struble is a 75 y.o. male with a hx of hypertension, obesity, hyperlipidemia, HFmrEF, prior pneumonia in 2013, and who is being seen today for the evaluation of exacerbation of HFmrEF at the request of Dr. Priscella Mann.  History of Present Illness:   Mr. Roosevelt is a 75 year old male with PMH as above.  He was last seen in clinic 05/19/2020 and doing well with some muscle ache on pravastatin.  He reported some shortness of breath thought due to deconditioning as previous 03/2020 stress test without ischemia and echo showed improvement of 04/2020 EF to 50 to 55%/low normal function.  He was continued on Lasix and his current medications.  On 01/18/2021, he presented to the emergency department with patient and wife combined report of shortness of breath, dyspnea, abdominal distention, orthopnea, PND, early satiety, and significant leg swelling over the last 2 weeks.  When compared with his previous visit in the clinic, he reports his symptoms of overload had significantly worsened starting 2 weeks ago.  He is joined today by his wife, who reports a 30 pound weight gain over those 14 days, gaining 2 pounds per day.  He is uncertain of the trigger.  He denies chest pain.  No tachypalpitations.  No presyncope or syncope.  He is uncertain of the trigger for his overload.  He reports medication compliance and denies any increased salt or fluid intake.  He reports drinking 2 bottles of water and otherwise no significant fluids or salt.  No alcohol use.  He reported medication compliance.  His wife reportedly called EMS after noticing his significant work  of breathing with ambulation, as she knew that he likely would not call EMS himself.    In the emergency department, he was noted to be significantly volume overloaded and started on IV Lasix 40 mg twice daily with improvement in symptoms.  High-sensitivity troponin 20 and not yet cycled.  EKG NSR, 86 bpm, IVCD, poor R wave progression and low voltage. BNP 951.0. Chest x-ray showed borderline cardiomegaly with left lower lobe atelectasis and small left effusion. Echo ordered and pending.  Renal function stable and electrolytes at goal.  H&H stable.  COVID-19 negative.   At the time of consultation, he reports significant improvement in symptoms, though work of breathing still noted.  Past Medical History:  Diagnosis Date   Depression    Hypertension    Hypothyroidism     Past Surgical History:  Procedure Laterality Date   COLONOSCOPY WITH PROPOFOL N/A 03/03/2015   Procedure: COLONOSCOPY WITH PROPOFOL;  Surgeon: Manya Silvas, MD;  Location: [facility];  Service: Endoscopy;  Laterality: N/A;   EYE SURGERY       Home Medications:  Prior to Admission medications   Medication Sig Start Date End Date Taking? Authorizing Provider  Ascorbic Acid (VITAMIN C) 100 MG tablet Take 100 mg by mouth daily.    [provider]  aspirin 81 MG chewable tablet Chew by mouth.    [provider]  cholecalciferol (VITAMIN D) 400 UNITS TABS tablet Take 400 Units by mouth.    [provider]  Cyanocobalamin (VITAMIN B 12 PO) Take  by mouth.    [provider]  folic acid (FOLVITE) 1 MG tablet Take 1 mg by mouth daily.    [provider]  furosemide (LASIX) 20 MG tablet Take 20 mg by mouth daily.    [provider]  ketoconazole (NIZORAL) 2 % shampoo Apply topically. 03/18/20   [provider]  levothyroxine (SYNTHROID, LEVOTHROID) 50 MCG tablet Take 50 mcg by mouth daily before breakfast.    [provider]  lisinopril  (PRINIVIL,ZESTRIL) 40 MG tablet Take 40 mg by mouth daily.    [provider]  polyethylene glycol powder (GLYCOLAX/MIRALAX) 17 GM/SCOOP powder Take as directed for colonoscopy prep. 01/14/15   [provider]  pravastatin (PRAVACHOL) 40 MG tablet Take 40 mg by mouth daily.    [provider]  triamcinolone (KENALOG) 0.1 % Apply topically. 01/08/20   [provider]    Inpatient Medications: Scheduled Meds:  aspirin  81 mg Oral Daily   enoxaparin (LOVENOX) injection  0.5 mg/kg Subcutaneous Q24H   furosemide  40 mg Intravenous Q12H   levothyroxine  50 mcg Oral Q0600   lisinopril  40 mg Oral Daily   melatonin  5 mg Oral QHS   pravastatin  40 mg Oral Daily   Continuous Infusions:  PRN Meds: acetaminophen **OR** acetaminophen, diphenhydrAMINE, magnesium hydroxide, ondansetron **OR** ondansetron (ZOFRAN) IV  Allergies:   No Known Allergies  Social History:   Social History   Socioeconomic History   Marital status: Married    Spouse name: Barnes Florek   Number of children: Not on file   Years of education: Not on file   Highest education level: Not on file  Occupational History   Not on file  Tobacco Use   Smoking status: Former   Smokeless tobacco: Former    Types: Nurse, children's Use: Unknown  Substance and Sexual Activity   Alcohol use: No   Drug use: Not on file   Sexual activity: Not on file  Other Topics Concern   Not on file  Social History Narrative   Not on file   Social Determinants of Health   Financial Resource Strain: Not on file  Food Insecurity: Not on file  Transportation Needs: Not on file  Physical Activity: Not on file  Stress: Not on file  Social Connections: Not on file  Intimate Partner Violence: Not on file    Family History:   History reviewed. No pertinent family history.   ROS:  Please see the history of present illness.  Review of Systems  Respiratory:  Positive for shortness of breath.  Negative for hemoptysis.   Cardiovascular:  Positive for orthopnea, leg swelling and PND. Negative for chest pain and palpitations.  Gastrointestinal:  Negative for blood in stool and melena.       Abdominal distention, early satiety  Genitourinary:  Negative for hematuria.  Musculoskeletal:  Negative for falls.  Neurological:  Negative for dizziness.  Psychiatric/Behavioral:         Reduced sleep 2/2 PND/orthopnea  All other systems reviewed and are negative.  All other ROS reviewed and negative.     Physical Exam/Data:   Vitals:   01/18/21 2335 01/19/21 0338 01/19/21 0818 01/19/21 1106  BP: (!) 119/58 120/63 109/67 (!) 111/56  Pulse: (!) 56 91 96 62  Resp: 18 18 18 17   Temp: 97.6 F (36.4 C) 97.8 F (36.6 C) 98 F (36.7 C) 98.4 F (36.9 C)  TempSrc:  SpO2: 91% 94% 95% 98%  Weight:  (!) 157 kg    Height:        Intake/Output Summary (Last 24 hours) at 01/19/2021 1150 Last data filed at 01/19/2021 1110 Gross per 24 hour  Intake 760 ml  Output 5150 ml  Net -4390 ml   Last 3 Weights 01/19/2021 01/18/2021 05/19/2020  Weight (lbs) 346 lb 1.6 oz 340 lb 315 lb  Weight (kg) 156.99 kg 154.223 kg 142.883 kg     Body mass index is 49.66 kg/m.  General:  Well nourished, well developed, in no acute distress.  Joined by his wife.  Work of breathing noted with accessory neck muscles and use of abdomen HEENT: normal Neck: JVD difficult to assess due to body habitus Vascular: No carotid bruits; radial pulses 2+ bilaterally  Cardiac:  normal S1, S2; RRR; 1/6 systolic murmur Lungs: Bibasilar reduced breath sounds Abd: Firm and distended  Ext: 1-2+ bilateral lower extremity edema Musculoskeletal:  No deformities, BUE and BLE strength normal and equal Skin: warm and dry, slight erythema in the setting of edema Neuro:  CNs 2-12 intact, no focal abnormalities noted Psych:  Normal affect   EKG:  The EKG was personally reviewed and demonstrates:   EKG NSR, 86 bpm, IVCD, poor R wave  progression / low voltage. Telemetry:  Telemetry was personally reviewed and demonstrates: NSR, frequent PVCs/bigeminy with rates 70s to low 100s  Relevant CV Studies: Echo 04/2020  1. Left ventricular ejection fraction, by estimation, is 50 to 55%. The  left ventricle has low normal function. The left ventricle grossly has no  regional wall motion abnormalities. Left ventricular diastolic parameters  are consistent with Grade II  diastolic dysfunction (pseudonormalization).   2. Right ventricular systolic function is normal. The right ventricular  size is normal. Tricuspid regurgitation signal is inadequate for assessing  PA pressure.   3. Left atrial size was moderately dilated.   4. Challenging images  Comparison(s): Previous exam in 2013.   MPI 03/2020 Pharmacological myocardial perfusion imaging study with no significant ischemia Small region of apical thinning, likely secondary to attenuation artifact Global hypokinesis, EF estimated at 25% No EKG changes concerning for ischemia at peak stress or in recovery. PVCs noted postinfusion CT attenuation correction images with mild aortic atherosclerosis in the arch, coronary calcification Moderate risk scan, consider confirmation of ejection fraction with echocardiogram  Laboratory Data:  High Sensitivity Troponin:   Recent Labs  Lab 01/18/21 1654  TROPONINIHS 20*     Chemistry Recent Labs  Lab 01/18/21 1654 01/19/21 0426  NA 136 137  K 4.9 4.0  CL 100 100  CO2 31 31  GLUCOSE 103* 115*  BUN 18 19  CREATININE 0.80 0.77  CALCIUM 8.7* 8.9  GFRNONAA >60 >60  ANIONGAP 5 6    Recent Labs  Lab 01/18/21 1654  PROT 6.2*  ALBUMIN 3.4*  AST 21  ALT 17  ALKPHOS 41  BILITOT 1.0   Hematology Recent Labs  Lab 01/18/21 1654 01/19/21 0426  WBC 8.4 8.9  RBC 4.54 4.26  HGB 13.4 12.9*  HCT 42.1 39.4  MCV 92.7 92.5  MCH 29.5 30.3  MCHC 31.8 32.7  RDW 15.1 15.1  PLT 151 129*   BNP Recent Labs  Lab  01/18/21 1654  BNP 951.0*    DDimer No results for input(s): DDIMER in the last 168 hours.   Radiology/Studies:  DG Chest [place] 1 View  Result Date: 01/18/2021 CLINICAL DATA:  Shortness of breath, leg swelling EXAM:  PORTABLE CHEST 1 VIEW COMPARISON:  06/23/2011 FINDINGS: Mild cardiomegaly, vascular congestion. Left basilar atelectasis or infiltrate. Suspect small left effusion. No overt edema. No acute bony abnormality. IMPRESSION: Borderline cardiomegaly, vascular congestion. Left lower lobe atelectasis or infiltrate.  Small left effusion. Electronically Signed   By: Rolm Baptise M.D.   On: 01/18/2021 17:09     Assessment and Plan:   Acute on chronic HFmrEF -- Presented with worsening symptoms of volume overload over the last 2 weeks.  Reports medication compliance and denies any increased salt or fluid intake.  Previous echo as above with EF 50 to 55%.  Repeat echo pending given unclear trigger for recent episode of overload and patient/wife report of compliance.  As previously noted, he does have risk factors for coronary disease with previous EF reduced to 45% and MPI performed but ruled low risk.   Continue IV diuresis and escalate as tolerated by renal function/BP for optimal output Daily BMET.  Most recent renal function and electrolytes stable.  Monitor I/os, daily weights.   Output -1.7 L with net output - 1.3 L. Wt suspected not accurate. CHF education recommended.   Continue lisinopril as BP allows given softer BP with diuresis.   ACE may need held to allow for escalation of diuresis -continue to monitor. Further recommendations if indicated pending repeat echo.  If EF reduced or significant structural abnormalities, further work-up recommended at that time.  Essential hypertension Continue lisinopril.  Most recent BP well controlled to soft. Monitor closely.  HLD Continue statin.  We will recheck LDL.  Obesity Lifestyle changes and weight loss advised.   For questions  or updates, please contact [place] Please consult [email or web address] for contact info under    Signed, Arvil Chaco, PA-C  01/19/2021 11:50 AM

## 2021-01-19 NOTE — Consult Note (Addendum)
   Heart Failure Nurse Navigator Note  HFrEF 25 to 30% previously reported at 50 to 55%.  Grade 2 diastolic dysfunction.  He presented to the emergency room with complaints of worsening lower extremity edema, abdominal distention and dyspnea on exertion and at rest.  He also noted PND and orthopnea.  Comorbidities:  Depression Hypertension Hypothyroidism Morbid obesity  Medications:  Aspirin 81 mg daily Furosemide 40 mg IV every 12 hours Lisinopril 40 mg daily Pravastatin 40 mg daily  Labs:  Sodium 137, potassium 4, chloride 100, CO2 31, BUN 19, creatinine 0.77, BNP 951 Weight is 157 kilograms   Initial meeting with patient.  Wife is present at the bedside.  States that he is now been admitted for this for heart failure.  Discussed the importance of daily weights, recording and what to report.  Discussed 2000 mg sodium restriction along with 64 ounce fluid restriction.  Discussed signs and symptoms to report to physician.  Also discussed the importance of follow-up in the outpatient heart failure clinic.  They were giving the living with heart failure teaching booklet along with information on low-sodium.  We will continue to follow.  Pricilla Riffle RN [facility]

## 2021-01-19 NOTE — Progress Notes (Signed)
*  PRELIMINARY RESULTS* Echocardiogram 2D Echocardiogram has been performed.  Sherrie Sport 01/19/2021, 1:52 PM

## 2021-01-19 NOTE — Progress Notes (Signed)
PROGRESS NOTE    Anthony Weber  KDT:[id number] DOB: 12/01/1945 DOA: 01/18/2021 PCP: Albina Billet, MD    Brief Narrative:  75 y.o. Caucasian male with medical history significant for depression, hypertension, diastolic CHF and hypothyroidism, presented to the emergency room with acute onset of worsening lower extremity edema with associated abdominal distention, recent dyspnea at rest and on exertion.  He admitted to orthopnea and paroxysmal nocturnal dyspnea.  He gained 15 pounds over the last couple of weeks.  No fever or chills.  He denies any cough or wheezing.  No dysuria, oliguria or hematuria or flank pain.  No chest pain or palpitations.  Started on aggressive IV diuresis.  Diuresed effectively.  Symptoms began to improve.   Assessment & Plan:   Active Problems:   Acute CHF (congestive heart failure) (HCC)  Acute on chronic diastolic congestive heart failure Last 2D echocardiogram in 2022, EF 50 to 99%, grade 2 diastolic dysfunction Unclear trigger for decompensation Suspect dietary indiscretion Plan: Lasix 40 mg IV twice daily Strict I's and O's Daily weights Low-sodium fluid restrict Target net -1-1.5 L daily Cardiology follow-up Defer repeat echocardiogram for now  Anasarca Abdominal distention, ascites, marked bilateral lower extremity edema Diuresis as above  Hypothyroidism PTA Synthroid  Essential hypertension PTA lisinopril  Hyperlipidemia PTA statin  Morbid obesity BMI 83.38 This complicates overall care and prognosis   DVT prophylaxis: SQ Lovenox Code Status: Full Family Communication: None today.  Offered to call, patient declined Disposition Plan: Status is: Inpatient  Remains inpatient appropriate because:Inpatient level of care appropriate due to severity of illness  Dispo: The patient is from: Home              Anticipated d/c is to: Home              Patient currently is not medically stable to d/c.   Difficult to place patient  No       Level of care: Progressive Cardiac  Consultants:  Cardiology  Procedures:  None  Antimicrobials: None   Subjective: Seen and examined.  Endorses poor sleep.  Endorses improvement in respiratory status since admission.  Objective: Vitals:   01/18/21 2335 01/19/21 0338 01/19/21 0818 01/19/21 1106  BP: (!) 119/58 120/63 109/67 (!) 111/56  Pulse: (!) 56 91 96 62  Resp: 18 18 18 17   Temp: 97.6 F (36.4 C) 97.8 F (36.6 C) 98 F (36.7 C) 98.4 F (36.9 C)  TempSrc:      SpO2: 91% 94% 95% 98%  Weight:  (!) 157 kg    Height:        Intake/Output Summary (Last 24 hours) at 01/19/2021 1130 Last data filed at 01/19/2021 1110 Gross per 24 hour  Intake 760 ml  Output 5150 ml  Net -4390 ml   Filed Weights   01/18/21 1640 01/19/21 0338  Weight: (!) 154.2 kg (!) 157 kg    Examination:  General exam: No acute distress Respiratory system: Bibasilar crackles.  Normal work of breathing.  Room air Cardiovascular system: S1-S2, RRR, 2/6 murmur, 3+ pitting edema BLE Gastrointestinal system: Obese, nontender,+ distention,+ bowel sounds Central nervous system: Alert and oriented. No focal neurological deficits. Extremities: Symmetric 5 x 5 power. Skin: No rashes, lesions or ulcers Psychiatry: Judgement and insight appear normal. Mood & affect appropriate.     Data Reviewed: I have personally reviewed following labs and imaging studies  CBC: Recent Labs  Lab 01/18/21 1654 01/19/21 0426  WBC 8.4 8.9  HGB 13.4 12.9*  HCT 42.1 39.4  MCV 92.7 92.5  PLT 151 323*   Basic Metabolic Panel: Recent Labs  Lab 01/18/21 1654 01/19/21 0426  NA 136 137  K 4.9 4.0  CL 100 100  CO2 31 31  GLUCOSE 103* 115*  BUN 18 19  CREATININE 0.80 0.77  CALCIUM 8.7* 8.9   GFR: Estimated Creatinine Clearance: 118.4 mL/min (by C-G formula based on SCr of 0.77 mg/dL). Liver Function Tests: Recent Labs  Lab 01/18/21 1654  AST 21  ALT 17  ALKPHOS 41  BILITOT 1.0  PROT  6.2*  ALBUMIN 3.4*   No results for input(s): LIPASE, AMYLASE in the last 168 hours. No results for input(s): AMMONIA in the last 168 hours. Coagulation Profile: No results for input(s): INR, PROTIME in the last 168 hours. Cardiac Enzymes: No results for input(s): CKTOTAL, CKMB, CKMBINDEX, TROPONINI in the last 168 hours. BNP (last 3 results) No results for input(s): PROBNP in the last 8760 hours. HbA1C: No results for input(s): HGBA1C in the last 72 hours. CBG: No results for input(s): GLUCAP in the last 168 hours. Lipid Profile: No results for input(s): CHOL, HDL, LDLCALC, TRIG, CHOLHDL, LDLDIRECT in the last 72 hours. Thyroid Function Tests: No results for input(s): TSH, T4TOTAL, FREET4, T3FREE, THYROIDAB in the last 72 hours. Anemia Panel: No results for input(s): VITAMINB12, FOLATE, FERRITIN, TIBC, IRON, RETICCTPCT in the last 72 hours. Sepsis Labs: No results for input(s): PROCALCITON, LATICACIDVEN in the last 168 hours.  Recent Results (from the past 240 hour(s))  Resp Panel by RT-PCR (Flu A&B, Covid) Nasopharyngeal Swab     Status: None   Collection Time: 01/18/21  7:54 PM   Specimen: Nasopharyngeal Swab; Nasopharyngeal(NP) swabs in vial transport medium  Result Value Ref Range Status   SARS Coronavirus 2 by RT PCR NEGATIVE NEGATIVE Final    Comment: (NOTE) SARS-CoV-2 target nucleic acids are NOT DETECTED.  The SARS-CoV-2 RNA is generally detectable in upper respiratory specimens during the acute phase of infection. The lowest concentration of SARS-CoV-2 viral copies this assay can detect is 138 copies/mL. A negative result does not preclude SARS-Cov-2 infection and should not be used as the sole basis for treatment or other patient management decisions. A negative result may occur with  improper specimen collection/handling, submission of specimen other than nasopharyngeal swab, presence of viral mutation(s) within the areas targeted by this assay, and inadequate  number of viral copies(<138 copies/mL). A negative result must be combined with clinical observations, patient history, and epidemiological information. The expected result is Negative.  Fact Sheet for Patients:  EntrepreneurPulse.com.au  Fact Sheet for Healthcare Providers:  IncredibleEmployment.be  This test is no t yet approved or cleared by the [place] FDA and  has been authorized for detection and/or diagnosis of SARS-CoV-2 by FDA under an Emergency Use Authorization (EUA). This EUA will remain  in effect (meaning this test can be used) for the duration of the COVID-19 declaration under Section 564(b)(1) of the Act, 21 U.S.C.section 360bbb-3(b)(1), unless the authorization is terminated  or revoked sooner.       Influenza A by PCR NEGATIVE NEGATIVE Final   Influenza B by PCR NEGATIVE NEGATIVE Final    Comment: (NOTE) The Xpert Xpress SARS-CoV-2/FLU/RSV plus assay is intended as an aid in the diagnosis of influenza from Nasopharyngeal swab specimens and should not be used as a sole basis for treatment. Nasal washings and aspirates are unacceptable for Xpert Xpress SARS-CoV-2/FLU/RSV testing.  Fact Sheet for Patients: EntrepreneurPulse.com.au  Fact Sheet for  Healthcare Providers: IncredibleEmployment.be  This test is not yet approved or cleared by the [place] and has been authorized for detection and/or diagnosis of SARS-CoV-2 by FDA under an Emergency Use Authorization (EUA). This EUA will remain in effect (meaning this test can be used) for the duration of the COVID-19 declaration under Section 564(b)(1) of the Act, 21 U.S.C. section 360bbb-3(b)(1), unless the authorization is terminated or revoked.  Performed at [facility], [address], [place], [place] [address]          Radiology Studies: DG Chest [place] 1 View  Result Date: 01/18/2021 CLINICAL DATA:   Shortness of breath, leg swelling EXAM: PORTABLE CHEST 1 VIEW COMPARISON:  06/23/2011 FINDINGS: Mild cardiomegaly, vascular congestion. Left basilar atelectasis or infiltrate. Suspect small left effusion. No overt edema. No acute bony abnormality. IMPRESSION: Borderline cardiomegaly, vascular congestion. Left lower lobe atelectasis or infiltrate.  Small left effusion. Electronically Signed   By: Rolm Baptise M.D.   On: 01/18/2021 17:09        Scheduled Meds:  aspirin  81 mg Oral Daily   enoxaparin (LOVENOX) injection  0.5 mg/kg Subcutaneous Q24H   furosemide  40 mg Intravenous Q12H   levothyroxine  50 mcg Oral Q0600   lisinopril  40 mg Oral Daily   melatonin  5 mg Oral QHS   pravastatin  40 mg Oral Daily   Continuous Infusions:   LOS: 1 day    Time spent: 35 minutes    Sidney Ace, MD [facility]   If 7PM-7AM, please contact night-coverage  01/19/2021, 11:30 AM

## 2021-01-19 NOTE — Plan of Care (Signed)
Patient was given Trazodone  25 mg and stated did not help. Has been watching TV whole shift. No BM, VSS, monitored cl closely,  Problem: Education: Goal: Ability to demonstrate management of disease process will improve Outcome: Progressing Goal: Ability to verbalize understanding of medication therapies will improve Outcome: Progressing Goal: Individualized Educational Video(s) Outcome: Progressing   Problem: Activity: Goal: Capacity to carry out activities will improve Outcome: Progressing   Problem: Cardiac: Goal: Ability to achieve and maintain adequate cardiopulmonary perfusion will improve Outcome: Progressing

## 2021-01-20 DIAGNOSIS — I5021 Acute systolic (congestive) heart failure: Secondary | ICD-10-CM

## 2021-01-20 DIAGNOSIS — I5041 Acute combined systolic (congestive) and diastolic (congestive) heart failure: Secondary | ICD-10-CM | POA: Diagnosis not present

## 2021-01-20 LAB — BASIC METABOLIC PANEL
Anion gap: 9 (ref 5–15)
BUN: 19 mg/dL (ref 8–23)
CO2: 34 mmol/L — ABNORMAL HIGH (ref 22–32)
Calcium: 9 mg/dL (ref 8.9–10.3)
Chloride: 94 mmol/L — ABNORMAL LOW (ref 98–111)
Creatinine, Ser: 0.84 mg/dL (ref 0.61–1.24)
GFR, Estimated: >60 mL/min (ref >60)
Glucose, Bld: 99 mg/dL (ref 70–99)
Potassium: 4.1 mmol/L (ref 3.5–5.1)
Sodium: 137 mmol/L (ref 135–145)

## 2021-01-20 LAB — MAGNESIUM: Magnesium: 1.9 mg/dL (ref 1.7–2.4)

## 2021-01-20 LAB — T4, FREE: Free T4: 1.17 ng/dL — ABNORMAL HIGH (ref 0.61–1.12)

## 2021-01-20 MED ORDER — ENOXAPARIN SODIUM 80 MG/0.8ML IJ SOSY
0.5000 mg/kg | PREFILLED_SYRINGE | INTRAMUSCULAR | Status: DC
  Administered 2021-01-20 – 2021-01-22 (×3): 75 mg via SUBCUTANEOUS
  Filled 2021-01-20 (×3): qty 0.8

## 2021-01-20 MED ORDER — DIPHENHYDRAMINE HCL 25 MG PO CAPS
25.0000 mg | ORAL_CAPSULE | Freq: Every day | ORAL | Status: DC
  Administered 2021-01-20 – 2021-01-29 (×7): 25 mg via ORAL
  Filled 2021-01-20 (×10): qty 1

## 2021-01-20 NOTE — Progress Notes (Signed)
Progress Note  Patient Name: Anthony Weber Date of Encounter: 01/20/2021  Primary Cardiologist: Dr. Rockey Situ   Subjective   Continues to note improved breathing s/p initiation of IV diuresis, though work of breathing still noted on exam.  No CP. States that he was able to lay flat earlier today for at least 20 minutes without any shortness of breath.  Discussed plan for catheterization later in admission as below.  Inpatient Medications    Scheduled Meds:  aspirin  81 mg Oral Daily   carvedilol  3.125 mg Oral BID WC   diphenhydrAMINE  25 mg Oral QHS   enoxaparin (LOVENOX) injection  0.5 mg/kg Subcutaneous Q24H   furosemide  40 mg Intravenous Q12H   levothyroxine  50 mcg Oral Q0600   losartan  50 mg Oral Daily   melatonin  5 mg Oral QHS   pravastatin  40 mg Oral Daily   Continuous Infusions:  PRN Meds: acetaminophen **OR** acetaminophen, magnesium hydroxide, ondansetron **OR** ondansetron (ZOFRAN) IV   Vital Signs    Vitals:   01/19/21 2029 01/20/21 0040 01/20/21 0504 01/20/21 0728  BP: 114/71 (!) 125/55 109/70 105/70  Pulse: 81 60 77 75  Resp: 20 18 20 17   Temp: 98.3 F (36.8 C) 98.5 F (36.9 C) 97.9 F (36.6 C) 98.7 F (37.1 C)  TempSrc: Oral Oral Oral   SpO2: 95% 94% 93% 94%  Weight:   (!) 150.3 kg   Height:        Intake/Output Summary (Last 24 hours) at 01/20/2021 1153 Last data filed at 01/20/2021 0940 Gross per 24 hour  Intake 620 ml  Output 2425 ml  Net -1805 ml   Last 3 Weights 01/20/2021 01/19/2021 01/18/2021  Weight (lbs) 331 lb 4.8 oz 346 lb 1.6 oz 340 lb  Weight (kg) 150.277 kg 156.99 kg 154.223 kg      Telemetry    Sinus rhythm with PVCs, 12 beat atrial run- Personally Reviewed  ECG    No new tracings- Personally Reviewed  Physical Exam   GEN: Obese male.  No acute distress.  Joined by family.   Neck: JVD difficult to assess due to body habitus Cardiac: RRR, no murmurs, rubs, or gallops.  Respiratory: Distant heart sounds  but otherwise clear to auscultation bilaterally. GI: Firm, distended MS: Bilateral 2+ lower extremity edema worse on the right side; No deformity. Neuro:  Nonfocal  Psych: Normal affect   Labs    High Sensitivity Troponin:   Recent Labs  Lab 01/18/21 1654 01/19/21 1238  TROPONINIHS 20* 22*      Chemistry Recent Labs  Lab 01/18/21 1654 01/19/21 0426 01/20/21 0806  NA 136 137 137  K 4.9 4.0 4.1  CL 100 100 94*  CO2 31 31 34*  GLUCOSE 103* 115* 99  BUN 18 19 19   CREATININE 0.80 0.77 0.84  CALCIUM 8.7* 8.9 9.0  PROT 6.2*  --   --   ALBUMIN 3.4*  --   --   AST 21  --   --   ALT 17  --   --   ALKPHOS 41  --   --   BILITOT 1.0  --   --   GFRNONAA >60 >60 >60  ANIONGAP 5 6 9      Hematology Recent Labs  Lab 01/18/21 1654 01/19/21 0426  WBC 8.4 8.9  RBC 4.54 4.26  HGB 13.4 12.9*  HCT 42.1 39.4  MCV 92.7 92.5  MCH 29.5 30.3  MCHC 31.8 32.7  RDW 15.1 15.1  PLT 151 129*    BNP Recent Labs  Lab 01/18/21 1654 01/19/21 1237  BNP 951.0* 942.4*     DDimer No results for input(s): DDIMER in the last 168 hours.   Radiology    DG Chest Port 1 View  Result Date: 01/18/2021 CLINICAL DATA:  Shortness of breath, leg swelling EXAM: PORTABLE CHEST 1 VIEW COMPARISON:  06/23/2011 FINDINGS: Mild cardiomegaly, vascular congestion. Left basilar atelectasis or infiltrate. Suspect small left effusion. No overt edema. No acute bony abnormality. IMPRESSION: Borderline cardiomegaly, vascular congestion. Left lower lobe atelectasis or infiltrate.  Small left effusion. Electronically Signed   By: Rolm Baptise M.D.   On: 01/18/2021 17:09   ECHOCARDIOGRAM COMPLETE  Result Date: 01/19/2021    ECHOCARDIOGRAM REPORT   Patient Name:   Anthony Weber Date of Exam: 01/19/2021 Medical Rec #:  [id number]            Height:       70.0 in Accession #:    [id number]           Weight:       346.1 lb Date of Birth:  01-20-1946            BSA:          2.635 m Patient Age:    75 years              BP:           111/56 mmHg Patient Gender: M                    HR:           62 bpm. Exam Location:  [facility] Procedure: 2D Echo, Cardiac Doppler, Color Doppler and Intracardiac            Opacification Agent Indications:     CHF I50.9  History:         Patient has prior history of Echocardiogram examinations, most                  recent 04/18/2020. Risk Factors:Hypertension.  Sonographer:     Sherrie Sport Referring Phys:  [id number] Robin Searing Chaka Jefferys Diagnosing Phys: Kathlyn Sacramento MD  Sonographer Comments: Technically difficult study due to poor echo windows. IMPRESSIONS  1. Left ventricular ejection fraction, by estimation, is 25 to 30%. The left ventricle has severely decreased function. Left ventricular endocardial border not optimally defined to evaluate regional wall motion. Left ventricular diastolic parameters are  indeterminate.  2. Right ventricular systolic function was not well visualized. The right ventricular size is normal. Tricuspid regurgitation signal is inadequate for assessing PA pressure.  3. The mitral valve was not well visualized. No evidence of mitral valve regurgitation. No evidence of mitral stenosis.  4. The aortic valve was not well visualized. Aortic valve regurgitation is not visualized. No aortic stenosis is present.  5. Technically difficult study due to poor echo windows.     Very limited study even with contrast agent. Consider alternative testing. FINDINGS  Left Ventricle: Left ventricular ejection fraction, by estimation, is 25 to 30%. The left ventricle has severely decreased function. Left ventricular endocardial border not optimally defined to evaluate regional wall motion. Definity contrast agent was given IV to delineate the left ventricular endocardial borders. The left ventricular internal cavity size was normal in size. There is no left ventricular hypertrophy. Left ventricular diastolic parameters are indeterminate. Right Ventricle: The right ventricular size is normal. No  increase in right ventricular wall thickness. Right ventricular systolic function was not well visualized. Tricuspid regurgitation signal is inadequate for assessing PA pressure. Left Atrium: Left atrial size was not well visualized. Right Atrium: Right atrial size was not well visualized. Pericardium: There is no evidence of pericardial effusion. Mitral Valve: The mitral valve was not well visualized. No evidence of mitral valve regurgitation. No evidence of mitral valve stenosis. Tricuspid Valve: The tricuspid valve is not well visualized. Tricuspid valve regurgitation is not demonstrated. No evidence of tricuspid stenosis. Aortic Valve: The aortic valve was not well visualized. Aortic valve regurgitation is not visualized. No aortic stenosis is present. Aortic valve mean gradient measures 1.0 mmHg. Aortic valve peak gradient measures 1.4 mmHg. Pulmonic Valve: The pulmonic valve was not well visualized. Pulmonic valve regurgitation is not visualized. No evidence of pulmonic stenosis. Aorta: The aortic root was not well visualized. Venous: The inferior vena cava was not well visualized. IAS/Shunts: No atrial level shunt detected by color flow Doppler.  LEFT VENTRICLE PLAX 2D LVIDd:         5.23 cm Diastology LVIDs:         4.67 cm LV e' medial:   6.74 cm/s LV PW:         1.22 cm LV E/e' medial: 13.1 LV IVS:        1.18 cm  RIGHT VENTRICLE RV S prime:     5.00 cm/s AORTIC VALVE                   PULMONIC VALVE AV Vmax:           58.80 cm/s  PV Vmax:        0.51 m/s AV Vmean:          40.000 cm/s PV Peak grad:   1.0 mmHg AV VTI:            0.074 m     RVOT Peak grad: 2 mmHg AV Peak Grad:      1.4 mmHg AV Mean Grad:      1.0 mmHg LVOT Vmax:         26.10 cm/s LVOT Vmean:        22.000 cm/s LVOT VTI:          0.045 m LVOT/AV VTI ratio: 0.61 MITRAL VALVE               TRICUSPID VALVE MV Area (PHT): 4.89 cm    TR Peak grad:   6.8 mmHg MV Decel Time: 155 msec    TR Vmax:        130.00 cm/s MV E velocity: 88.30 cm/s MV A  velocity: 51.80 cm/s  SHUNTS MV E/A ratio:  1.70        Systemic VTI: 0.05 m Kathlyn Sacramento MD Electronically signed by Kathlyn Sacramento MD Signature Date/Time: 01/19/2021/2:21:20 PM    Final     Cardiac Studies   Echo 01/19/2021  1. Left ventricular ejection fraction, by estimation, is 25 to 30%. The  left ventricle has severely decreased function. Left ventricular  endocardial border not optimally defined to evaluate regional wall motion.  Left ventricular diastolic parameters are   indeterminate.   2. Right ventricular systolic function was not well visualized. The right  ventricular size is normal. Tricuspid regurgitation signal is inadequate  for assessing PA pressure.   3. The mitral valve was not well visualized. No evidence of mitral valve  regurgitation. No evidence of mitral stenosis.   4.  The aortic valve was not well visualized. Aortic valve regurgitation  is not visualized. No aortic stenosis is present.   5. Technically difficult study due to poor echo windows.      Very limited study even with contrast agent. Consider alternative  testing.   Echo 04/18/2020  1. Left ventricular ejection fraction, by estimation, is 50 to 55%. The  left ventricle has low normal function. The left ventricle grossly has no  regional wall motion abnormalities. Left ventricular diastolic parameters  are consistent with Grade II  diastolic dysfunction (pseudonormalization).   2. Right ventricular systolic function is normal. The right ventricular  size is normal. Tricuspid regurgitation signal is inadequate for assessing  PA pressure.   3. Left atrial size was moderately dilated.   4. Challenging images   MPI 03/2020 Pharmacological myocardial perfusion imaging study with no significant ischemia Small region of apical thinning, likely secondary to attenuation artifact Global hypokinesis, EF estimated at 25% No EKG changes concerning for ischemia at peak stress or in recovery. PVCs noted  postinfusion CT attenuation correction images with mild aortic atherosclerosis in the arch, coronary calcification Moderate risk scan, consider confirmation of ejection fraction with echocardiogram    Patient Profile     75 y.o. male with history of newly reduced EF 25 to 30%, hypertension, obesity, hyperlipidemia, prior pneumonia in 2013, and who is being seen today for the evaluation of exacerbation of heart failure with echo showing EF reduced from previous 50 to 55% to 01/19/2021 EF 25 to 30% and recommendation for right left heart catheterization for further ischemic work-up and better understanding of hemodynamics.  Assessment & Plan     Acute on chronic HFrEF, newly reduced EF --Work of breathing still noted, despite the above pt reported improved symptoms.  He presented 10/9 with worsening sx of overload x2 weeks. Susequent echo showed EF reduced from previous EF 50 to 55%  EF 25-30%. Started on IV diuresis with improvement in sx, and though volume status is improving, he is still severely volume up. Pt and family aware of plan for ongoing IV diuresis, followed by [facility] for further ischemic workup and better understanding of hemodynamics once we have optimized his volume status.   Continue IV lasix 40mg  BID. Daily BMET.  Cr stable. Monitor I/os, daily weights.   Output -7.6L / net output - 6.2L. Wt 157.0  150.3. CHF education. Continue losartan & transition to [facility] s/p 36-hour washout of ACE Lisinopril last dose= 10/10. Continue Coreg 3.125 mg twice daily Consider future addition of spironolactone / SGLT2i before discharge. R/LHC once volume status improved.  Pt/ family agreeable to this plan.   Essential hypertension BP well controlled. Continue losartan with plan to transition to [facility] s/p 36-hour washout of lisinopril (ACE discontinued 10/10, estimated start 10/13).    HLD LDL 51.  Continue statin.     Obesity Lifestyle changes and weight loss advised.  For  questions or updates, please contact [place] Please consult [email or web address] for contact info under        Signed, Arvil Chaco, PA-C  01/20/2021, 11:53 AM

## 2021-01-20 NOTE — Evaluation (Signed)
Occupational Therapy Evaluation Patient Details Name: Anthony Weber MRN: [id number] DOB: December 20, 1945 Today's Date: 01/20/2021   History of Present Illness 75 y.o. Caucasian male with medical history significant for depression, hypertension, diastolic CHF and hypothyroidism, presented to the emergency room with acute onset of worsening lower extremity edema with associated abdominal distention, recent dyspnea at rest and on exertion.  He admitted to orthopnea and paroxysmal nocturnal dyspnea.  He gained 15 pounds over the last couple of weeks.  No fever or chills.  He denies any cough or wheezing   Clinical Impression   Patient presenting with decreased Ind in self care,balance, functional mobility/transfer, endurance, and safety awareness. Patient reports being independent at baseline. 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 His wife reports he ambulates short distances and furniture walks. He needs assist to don shoes and socks as well. This session with needing min A overall without use of AD for functional mobility. Pt fatigues quickly but able to ambulate 20' and reaching out to hold onto wall(similar to home) without LOB. HR remains in the 90's and O2 saturation is 94% with mobility and standing tasks on RA. Patient will benefit from acute OT to increase overall independence in the areas of ADLs, functional mobility, and safety awareness in order to safely discharge home with family.      Recommendations for follow up therapy are one component of a multi-disciplinary discharge planning process, led by the attending physician.  Recommendations may be updated based on patient status, additional functional criteria and insurance authorization.   Follow Up Recommendations  No OT follow up;Supervision - Intermittent    Equipment Recommendations  Other (comment) (RW for energy conservation and balance)       Precautions / Restrictions Precautions Precautions: Fall      Mobility  Bed Mobility Overal bed mobility: Needs Assistance Bed Mobility: Supine to Sit;Sit to Supine     Supine to sit: Min assist Sit to supine: Mod assist   General bed mobility comments: assist with B LEs to return to bed    Transfers Overall transfer level: Needs assistance Equipment used: 1 person hand held assist Transfers: Sit to/from Stand;Stand Pivot Transfers Sit to Stand: Min guard;Min assist Stand pivot transfers: Min assist       General transfer comment: min A for balance    Balance Overall balance assessment: Needs assistance Sitting-balance support: Feet supported Sitting balance-Leahy Scale: Good     Standing balance support: During functional activity Standing balance-Leahy Scale: Fair                             ADL either performed or assessed with clinical judgement   ADL Overall ADL's : Needs assistance/impaired                     Lower Body Dressing: Minimal assistance;Sit to/from stand   Toilet Transfer: Minimal assistance;Comfort height toilet Toilet Transfer Details (indicate cue type and reason): simulated transfer         Functional mobility during ADLs: Min guard;Minimal assistance       Vision Patient Visual Report: No change from baseline              Pertinent Vitals/Pain Pain Assessment: No/denies pain     Hand Dominance Right   Extremity/Trunk Assessment Upper Extremity Assessment Upper Extremity Assessment: Generalized weakness;Overall [facility] for tasks assessed (increased edema throughout)   Lower Extremity Assessment Lower Extremity Assessment: Overall  WFL for tasks assessed;Generalized weakness (increased edema throughout)       Communication Communication Communication: No difficulties   Cognition Arousal/Alertness: Awake/alert Behavior During Therapy: WFL for tasks assessed/performed Overall Cognitive Status: Within Functional Limits for tasks assessed                                                 Home Living Family/patient expects to be discharged to:: Private residence Living Arrangements: Spouse/significant other Available Help at Discharge: Family;Available 24 hours/day Type of Home: House Home Access: Stairs to enter [employer] of Steps: 2   Home Layout: One level;Other (Comment) (1 step up/down into family area)     Bathroom Shower/Tub: Walk-in shower         Home Equipment: Cane - single point          Prior Functioning/Environment Level of Independence: Independent        Comments: Pt reports short distance ambulation without use of AD. Wife reports, " He only walks as far as he has to". He furniture walks at baseline and sometimes needs assist with donning shoes.        OT Problem List: Decreased strength;Decreased activity tolerance;Impaired balance (sitting and/or standing);Decreased safety awareness;Decreased knowledge of use of DME or AE      OT Treatment/Interventions: Self-care/ADL training;Therapeutic exercise;Therapeutic activities;Energy conservation;Patient/family education;DME and/or AE instruction;Balance training;Manual therapy    OT Goals(Current goals can be found in the care plan section) Acute Rehab OT Goals Patient Stated Goal: to return home OT Goal Formulation: With patient/family Time For Goal Achievement: 02/03/21 Potential to Achieve Goals: Good ADL Goals Pt Will Perform Grooming: with modified independence;standing Pt Will Perform Lower Body Dressing: with supervision;sit to/from stand Pt Will Transfer to Toilet: with supervision;ambulating Pt Will Perform Toileting - Clothing Manipulation and hygiene: with supervision;sit to/from stand  OT Frequency: Min 2X/week   Barriers to D/C:    none known at this time          AM-PAC OT "6 Clicks" Daily Activity     Outcome Measure Help from another person eating meals?: None Help from another person taking care of personal grooming?: A  Little Help from another person toileting, which includes using toliet, bedpan, or urinal?: A Little Help from another person bathing (including washing, rinsing, drying)?: A Little Help from another person to put on and taking off regular upper body clothing?: None Help from another person to put on and taking off regular lower body clothing?: A Little 6 Click Score: 20   End of Session Nurse Communication: Mobility status  Activity Tolerance: Patient tolerated treatment well;Patient limited by fatigue Patient left: in bed;with call bell/phone within reach;with bed alarm set;with family/visitor present  OT Visit Diagnosis: Unsteadiness on feet (R26.81);Muscle weakness (generalized) (M62.81)                Time: 8182-9937 OT Time Calculation (min): 18 min Charges:  OT General Charges $OT Visit: 1 Visit OT Evaluation $OT Eval Low Complexity: 1 Low OT Treatments $Therapeutic Activity: 8-22 mins  Darleen Crocker, MS, OTR/L , CBIS ascom [phone]  01/20/21, 2:53 PM

## 2021-01-20 NOTE — Progress Notes (Signed)
PROGRESS NOTE    Anthony Weber  KGY:[id number] DOB: 1945-09-23 DOA: 01/18/2021 PCP: Albina Billet, MD    Brief Narrative:  75 y.o. Caucasian male with medical history significant for depression, hypertension, diastolic CHF and hypothyroidism, presented to the emergency room with acute onset of worsening lower extremity edema with associated abdominal distention, recent dyspnea at rest and on exertion.  He admitted to orthopnea and paroxysmal nocturnal dyspnea.  He gained 15 pounds over the last couple of weeks.  No fever or chills.  He denies any cough or wheezing.  No dysuria, oliguria or hematuria or flank pain.  No chest pain or palpitations.  Started on aggressive IV diuresis.  Diuresed effectively.  Symptoms began to improve.   Assessment & Plan:   Active Problems:   Acute CHF (congestive heart failure) (HCC)  Acute on chronic diastolic congestive heart failure Last 2D echocardiogram in 2022, EF 50 to 02%, grade 2 diastolic dysfunction Unclear trigger for decompensation Suspect dietary indiscretion Responded to IV loop diuretic Plan: Continue Lasix 40 mg IV twice daily Strict I's and O's Daily weights Low-sodium fluid restrict Target net -1-1.5 L daily Cardiology follow-up Defer repeat echocardiogram for now Once patient is more euvolemic will require left and right heart catheterization.  Defer to cardiology regarding timing  Anasarca Abdominal distention, ascites, marked bilateral lower extremity edema Diuresis as above  Hypothyroidism TSH at 9 Continue PTA Synthroid Check free T3 and T4  Essential hypertension PTA lisinopril  Hyperlipidemia PTA statin  Morbid obesity BMI 63.78 This complicates overall care and prognosis   DVT prophylaxis: SQ Lovenox Code Status: Full Family Communication: Spouse at bedside 10/11 Disposition Plan: Status is: Inpatient  Remains inpatient appropriate because:Inpatient level of care appropriate due to severity of  illness  Dispo: The patient is from: Home              Anticipated d/c is to: Home              Patient currently is not medically stable to d/c.   Difficult to place patient No       Level of care: Progressive Cardiac  Consultants:  Cardiology  Procedures:  None  Antimicrobials: None   Subjective: Patient seen and examined.  Continues to endorse poor sleep.  Improvement in respiratory status.  Improvement in fluid overload  Objective: Vitals:   01/19/21 2029 01/20/21 0040 01/20/21 0504 01/20/21 0728  BP: 114/71 (!) 125/55 109/70 105/70  Pulse: 81 60 77 75  Resp: 20 18 20 17   Temp: 98.3 F (36.8 C) 98.5 F (36.9 C) 97.9 F (36.6 C) 98.7 F (37.1 C)  TempSrc: Oral Oral Oral   SpO2: 95% 94% 93% 94%  Weight:   (!) 150.3 kg   Height:        Intake/Output Summary (Last 24 hours) at 01/20/2021 1205 Last data filed at 01/20/2021 0940 Gross per 24 hour  Intake 620 ml  Output 2425 ml  Net -1805 ml   Filed Weights   01/18/21 1640 01/19/21 0338 01/20/21 0504  Weight: (!) 154.2 kg (!) 157 kg (!) 150.3 kg    Examination:  General exam: No acute distress.  Sitting up in bed Respiratory system: Bibasilar crackles.  Normal work of breathing.  Room air Cardiovascular system: S1-S2, RRR, no murmurs, 3+ pitting edema bilateral Gastrointestinal system: Obese, nontender,+ distention,+ bowel sounds Central nervous system: Alert and oriented. No focal neurological deficits. Extremities: Symmetric 5 x 5 power. Skin: No rashes, lesions or ulcers Psychiatry:  Judgement and insight appear normal. Mood & affect appropriate.     Data Reviewed: I have personally reviewed following labs and imaging studies  CBC: Recent Labs  Lab 01/18/21 1654 01/19/21 0426  WBC 8.4 8.9  HGB 13.4 12.9*  HCT 42.1 39.4  MCV 92.7 92.5  PLT 151 856*   Basic Metabolic Panel: Recent Labs  Lab 01/18/21 1654 01/19/21 0426 01/20/21 0806  NA 136 137 137  K 4.9 4.0 4.1  CL 100 100 94*   CO2 31 31 34*  GLUCOSE 103* 115* 99  BUN 18 19 19   CREATININE 0.80 0.77 0.84  CALCIUM 8.7* 8.9 9.0  MG  --   --  1.9   GFR: Estimated Creatinine Clearance: 109.9 mL/min (by C-G formula based on SCr of 0.84 mg/dL). Liver Function Tests: Recent Labs  Lab 01/18/21 1654  AST 21  ALT 17  ALKPHOS 41  BILITOT 1.0  PROT 6.2*  ALBUMIN 3.4*   No results for input(s): LIPASE, AMYLASE in the last 168 hours. No results for input(s): AMMONIA in the last 168 hours. Coagulation Profile: No results for input(s): INR, PROTIME in the last 168 hours. Cardiac Enzymes: No results for input(s): CKTOTAL, CKMB, CKMBINDEX, TROPONINI in the last 168 hours. BNP (last 3 results) No results for input(s): PROBNP in the last 8760 hours. HbA1C: No results for input(s): HGBA1C in the last 72 hours. CBG: No results for input(s): GLUCAP in the last 168 hours. Lipid Profile: Recent Labs    01/19/21 1238  CHOL 101  HDL 40*  LDLCALC 51  TRIG 49  CHOLHDL 2.5   Thyroid Function Tests: Recent Labs    01/19/21 1238  TSH 9.059*   Anemia Panel: No results for input(s): VITAMINB12, FOLATE, FERRITIN, TIBC, IRON, RETICCTPCT in the last 72 hours. Sepsis Labs: No results for input(s): PROCALCITON, LATICACIDVEN in the last 168 hours.  Recent Results (from the past 240 hour(s))  Resp Panel by RT-PCR (Flu A&B, Covid) Nasopharyngeal Swab     Status: None   Collection Time: 01/18/21  7:54 PM   Specimen: Nasopharyngeal Swab; Nasopharyngeal(NP) swabs in vial transport medium  Result Value Ref Range Status   SARS Coronavirus 2 by RT PCR NEGATIVE NEGATIVE Final    Comment: (NOTE) SARS-CoV-2 target nucleic acids are NOT DETECTED.  The SARS-CoV-2 RNA is generally detectable in upper respiratory specimens during the acute phase of infection. The lowest concentration of SARS-CoV-2 viral copies this assay can detect is 138 copies/mL. A negative result does not preclude SARS-Cov-2 infection and should not be used  as the sole basis for treatment or other patient management decisions. A negative result may occur with  improper specimen collection/handling, submission of specimen other than nasopharyngeal swab, presence of viral mutation(s) within the areas targeted by this assay, and inadequate number of viral copies(<138 copies/mL). A negative result must be combined with clinical observations, patient history, and epidemiological information. The expected result is Negative.  Fact Sheet for Patients:  EntrepreneurPulse.com.au  Fact Sheet for Healthcare Providers:  IncredibleEmployment.be  This test is no t yet approved or cleared by the [place] FDA and  has been authorized for detection and/or diagnosis of SARS-CoV-2 by FDA under an Emergency Use Authorization (EUA). This EUA will remain  in effect (meaning this test can be used) for the duration of the COVID-19 declaration under Section 564(b)(1) of the Act, 21 U.S.C.section 360bbb-3(b)(1), unless the authorization is terminated  or revoked sooner.       Influenza A by PCR  NEGATIVE NEGATIVE Final   Influenza B by PCR NEGATIVE NEGATIVE Final    Comment: (NOTE) The Xpert Xpress SARS-CoV-2/FLU/RSV plus assay is intended as an aid in the diagnosis of influenza from Nasopharyngeal swab specimens and should not be used as a sole basis for treatment. Nasal washings and aspirates are unacceptable for Xpert Xpress SARS-CoV-2/FLU/RSV testing.  Fact Sheet for Patients: EntrepreneurPulse.com.au  Fact Sheet for Healthcare Providers: IncredibleEmployment.be  This test is not yet approved or cleared by the [place] FDA and has been authorized for detection and/or diagnosis of SARS-CoV-2 by FDA under an Emergency Use Authorization (EUA). This EUA will remain in effect (meaning this test can be used) for the duration of the COVID-19 declaration under Section 564(b)(1)  of the Act, 21 U.S.C. section 360bbb-3(b)(1), unless the authorization is terminated or revoked.  Performed at [facility], [address], [place], [place] [address]          Radiology Studies: DG Chest [place] 1 View  Result Date: 01/18/2021 CLINICAL DATA:  Shortness of breath, leg swelling EXAM: PORTABLE CHEST 1 VIEW COMPARISON:  06/23/2011 FINDINGS: Mild cardiomegaly, vascular congestion. Left basilar atelectasis or infiltrate. Suspect small left effusion. No overt edema. No acute bony abnormality. IMPRESSION: Borderline cardiomegaly, vascular congestion. Left lower lobe atelectasis or infiltrate.  Small left effusion. Electronically Signed   By: Rolm Baptise M.D.   On: 01/18/2021 17:09   ECHOCARDIOGRAM COMPLETE  Result Date: 01/19/2021    ECHOCARDIOGRAM REPORT   Patient Name:   Anthony Weber Date of Exam: 01/19/2021 Medical Rec #:  [id number]            Height:       70.0 in Accession #:    [id number]           Weight:       346.1 lb Date of Birth:  [birth year]            BSA:          2.635 m Patient Age:    2 years             BP:           111/56 mmHg Patient Gender: M                    HR:           62 bpm. Exam Location:  [facility] Procedure: 2D Echo, Cardiac Doppler, Color Doppler and Intracardiac            Opacification Agent Indications:     CHF I50.9  History:         Patient has prior history of Echocardiogram examinations, most                  recent 04/18/2020. Risk Factors:Hypertension.  Sonographer:     Sherrie Sport Referring Phys:  [id number] Robin Searing VISSER Diagnosing Phys: Kathlyn Sacramento MD  Sonographer Comments: Technically difficult study due to poor echo windows. IMPRESSIONS  1. Left ventricular ejection fraction, by estimation, is 25 to 30%. The left ventricle has severely decreased function. Left ventricular endocardial border not optimally defined to evaluate regional wall motion. Left ventricular diastolic parameters are  indeterminate.  2. Right ventricular  systolic function was not well visualized. The right ventricular size is normal. Tricuspid regurgitation signal is inadequate for assessing PA pressure.  3. The mitral valve was not well visualized. No evidence of mitral valve regurgitation. No evidence of mitral stenosis.  4. The aortic valve was not well visualized. Aortic valve regurgitation is not visualized. No aortic stenosis is present.  5. Technically difficult study due to poor echo windows.     Very limited study even with contrast agent. Consider alternative testing. FINDINGS  Left Ventricle: Left ventricular ejection fraction, by estimation, is 25 to 30%. The left ventricle has severely decreased function. Left ventricular endocardial border not optimally defined to evaluate regional wall motion. Definity contrast agent was given IV to delineate the left ventricular endocardial borders. The left ventricular internal cavity size was normal in size. There is no left ventricular hypertrophy. Left ventricular diastolic parameters are indeterminate. Right Ventricle: The right ventricular size is normal. No increase in right ventricular wall thickness. Right ventricular systolic function was not well visualized. Tricuspid regurgitation signal is inadequate for assessing PA pressure. Left Atrium: Left atrial size was not well visualized. Right Atrium: Right atrial size was not well visualized. Pericardium: There is no evidence of pericardial effusion. Mitral Valve: The mitral valve was not well visualized. No evidence of mitral valve regurgitation. No evidence of mitral valve stenosis. Tricuspid Valve: The tricuspid valve is not well visualized. Tricuspid valve regurgitation is not demonstrated. No evidence of tricuspid stenosis. Aortic Valve: The aortic valve was not well visualized. Aortic valve regurgitation is not visualized. No aortic stenosis is present. Aortic valve mean gradient measures 1.0 mmHg. Aortic valve peak gradient measures 1.4 mmHg. Pulmonic  Valve: The pulmonic valve was not well visualized. Pulmonic valve regurgitation is not visualized. No evidence of pulmonic stenosis. Aorta: The aortic root was not well visualized. Venous: The inferior vena cava was not well visualized. IAS/Shunts: No atrial level shunt detected by color flow Doppler.  LEFT VENTRICLE PLAX 2D LVIDd:         5.23 cm Diastology LVIDs:         4.67 cm LV e' medial:   6.74 cm/s LV PW:         1.22 cm LV E/e' medial: 13.1 LV IVS:        1.18 cm  RIGHT VENTRICLE RV S prime:     5.00 cm/s AORTIC VALVE                   PULMONIC VALVE AV Vmax:           58.80 cm/s  PV Vmax:        0.51 m/s AV Vmean:          40.000 cm/s PV Peak grad:   1.0 mmHg AV VTI:            0.074 m     RVOT Peak grad: 2 mmHg AV Peak Grad:      1.4 mmHg AV Mean Grad:      1.0 mmHg LVOT Vmax:         26.10 cm/s LVOT Vmean:        22.000 cm/s LVOT VTI:          0.045 m LVOT/AV VTI ratio: 0.61 MITRAL VALVE               TRICUSPID VALVE MV Area (PHT): 4.89 cm    TR Peak grad:   6.8 mmHg MV Decel Time: 155 msec    TR Vmax:        130.00 cm/s MV E velocity: 88.30 cm/s MV A velocity: 51.80 cm/s  SHUNTS MV E/A ratio:  1.70        Systemic VTI: 0.05 m Kathlyn Sacramento  MD Electronically signed by Kathlyn Sacramento MD Signature Date/Time: 01/19/2021/2:21:20 PM    Final         Scheduled Meds:  aspirin  81 mg Oral Daily   carvedilol  3.125 mg Oral BID WC   diphenhydrAMINE  25 mg Oral QHS   enoxaparin (LOVENOX) injection  0.5 mg/kg Subcutaneous Q24H   furosemide  40 mg Intravenous Q12H   levothyroxine  50 mcg Oral Q0600   losartan  50 mg Oral Daily   melatonin  5 mg Oral QHS   pravastatin  40 mg Oral Daily   Continuous Infusions:   LOS: 2 days    Time spent: 25 minutes    Sidney Ace, MD [facility]   If 7PM-7AM, please contact night-coverage  01/20/2021, 12:05 PM

## 2021-01-21 DIAGNOSIS — E785 Hyperlipidemia, unspecified: Secondary | ICD-10-CM

## 2021-01-21 DIAGNOSIS — I5041 Acute combined systolic (congestive) and diastolic (congestive) heart failure: Secondary | ICD-10-CM | POA: Diagnosis not present

## 2021-01-21 DIAGNOSIS — I10 Essential (primary) hypertension: Secondary | ICD-10-CM

## 2021-01-21 DIAGNOSIS — E039 Hypothyroidism, unspecified: Secondary | ICD-10-CM | POA: Diagnosis not present

## 2021-01-21 LAB — BASIC METABOLIC PANEL
Anion gap: 8 (ref 5–15)
BUN: 25 mg/dL — ABNORMAL HIGH (ref 8–23)
CO2: 33 mmol/L — ABNORMAL HIGH (ref 22–32)
Calcium: 8.7 mg/dL — ABNORMAL LOW (ref 8.9–10.3)
Chloride: 93 mmol/L — ABNORMAL LOW (ref 98–111)
Creatinine, Ser: 0.87 mg/dL (ref 0.61–1.24)
GFR, Estimated: >60 mL/min (ref >60)
Glucose, Bld: 102 mg/dL — ABNORMAL HIGH (ref 70–99)
Potassium: 4.1 mmol/L (ref 3.5–5.1)
Sodium: 134 mmol/L — ABNORMAL LOW (ref 135–145)

## 2021-01-21 LAB — MAGNESIUM: Magnesium: 1.9 mg/dL (ref 1.7–2.4)

## 2021-01-21 LAB — T3, FREE: T3, Free: 2.2 pg/mL (ref 2.0–4.4)

## 2021-01-21 NOTE — Plan of Care (Signed)
Problem: Education: Goal: Ability to demonstrate management of disease process will improve 01/21/2021 1130 by Cristela Blue, RN Outcome: Progressing 01/21/2021 1130 by Cristela Blue, RN Outcome: Progressing 01/21/2021 1100 by Cristela Blue, RN Outcome: Progressing 01/21/2021 1100 by Cristela Blue, RN Outcome: Progressing Goal: Ability to verbalize understanding of medication therapies will improve 01/21/2021 1130 by Cristela Blue, RN Outcome: Progressing 01/21/2021 1130 by Cristela Blue, RN Outcome: Progressing 01/21/2021 1100 by Cristela Blue, RN Outcome: Progressing 01/21/2021 1100 by Cristela Blue, RN Outcome: Progressing Goal: Individualized Educational Video(s) 01/21/2021 1130 by Cristela Blue, RN Outcome: Progressing 01/21/2021 1130 by Cristela Blue, RN Outcome: Progressing 01/21/2021 1100 by Cristela Blue, RN Outcome: Progressing 01/21/2021 1100 by Cristela Blue, RN Outcome: Progressing   Problem: Activity: Goal: Capacity to carry out activities will improve 01/21/2021 1130 by Cristela Blue, RN Outcome: Progressing 01/21/2021 1130 by Cristela Blue, RN Outcome: Progressing 01/21/2021 1100 by Cristela Blue, RN Outcome: Progressing 01/21/2021 1100 by Cristela Blue, RN Outcome: Progressing   Problem: Cardiac: Goal: Ability to achieve and maintain adequate cardiopulmonary perfusion will improve 01/21/2021 1130 by Cristela Blue, RN Outcome: Progressing 01/21/2021 1130 by Cristela Blue, RN Outcome: Progressing 01/21/2021 1100 by Cristela Blue, RN Outcome: Progressing 01/21/2021 1100 by Cristela Blue, RN Outcome: Progressing   Problem: Education: Goal: Knowledge of General Education information will improve Description: Including pain rating scale, medication(s)/side effects and non-pharmacologic comfort measures 01/21/2021 1130 by Cristela Blue, RN Outcome: Progressing 01/21/2021 1130 by Cristela Blue, RN Outcome:  Progressing 01/21/2021 1100 by Cristela Blue, RN Outcome: Progressing 01/21/2021 1100 by Cristela Blue, RN Outcome: Progressing   Problem: Health Behavior/Discharge Planning: Goal: Ability to manage health-related needs will improve 01/21/2021 1130 by Cristela Blue, RN Outcome: Progressing 01/21/2021 1130 by Cristela Blue, RN Outcome: Progressing 01/21/2021 1100 by Cristela Blue, RN Outcome: Progressing 01/21/2021 1100 by Cristela Blue, RN Outcome: Progressing   Problem: Clinical Measurements: Goal: Ability to maintain clinical measurements within normal limits will improve 01/21/2021 1130 by Cristela Blue, RN Outcome: Progressing 01/21/2021 1130 by Cristela Blue, RN Outcome: Progressing 01/21/2021 1100 by Cristela Blue, RN Outcome: Progressing 01/21/2021 1100 by Cristela Blue, RN Outcome: Progressing Goal: Will remain free from infection 01/21/2021 1130 by Cristela Blue, RN Outcome: Progressing 01/21/2021 1130 by Cristela Blue, RN Outcome: Progressing 01/21/2021 1100 by Cristela Blue, RN Outcome: Progressing 01/21/2021 1100 by Cristela Blue, RN Outcome: Progressing Goal: Diagnostic test results will improve 01/21/2021 1130 by Cristela Blue, RN Outcome: Progressing 01/21/2021 1130 by Cristela Blue, RN Outcome: Progressing 01/21/2021 1100 by Cristela Blue, RN Outcome: Progressing 01/21/2021 1100 by Cristela Blue, RN Outcome: Progressing Goal: Respiratory complications will improve 01/21/2021 1130 by Cristela Blue, RN Outcome: Progressing 01/21/2021 1130 by Cristela Blue, RN Outcome: Progressing 01/21/2021 1100 by Cristela Blue, RN Outcome: Progressing 01/21/2021 1100 by Cristela Blue, RN Outcome: Progressing Goal: Cardiovascular complication will be avoided 01/21/2021 1130 by Cristela Blue, RN Outcome: Progressing 01/21/2021 1130 by Cristela Blue, RN Outcome: Progressing 01/21/2021 1100 by Cristela Blue, RN Outcome:  Progressing 01/21/2021 1100 by Cristela Blue, RN Outcome: Progressing   Problem: Activity: Goal: Risk for activity intolerance will decrease 01/21/2021 1130 by Cristela Blue, RN Outcome: Progressing 01/21/2021 1130 by Cristela Blue, RN Outcome: Progressing 01/21/2021 1100 by Cristela Blue, RN Outcome: Progressing 01/21/2021 1100 by Cristela Blue, RN Outcome: Progressing   Problem: Nutrition: Goal: Adequate nutrition will be maintained 01/21/2021 1130 by Cristela Blue, RN Outcome: Progressing 01/21/2021 1130 by Cristela Blue, RN Outcome: Progressing 01/21/2021 1100 by Cristela Blue, RN Outcome: Progressing 01/21/2021 1100 by Cristela Blue, RN Outcome: Progressing  Problem: Coping: Goal: Level of anxiety will decrease 01/21/2021 1130 by Cristela Blue, RN Outcome: Progressing 01/21/2021 1130 by Cristela Blue, RN Outcome: Progressing 01/21/2021 1100 by Cristela Blue, RN Outcome: Progressing 01/21/2021 1100 by Cristela Blue, RN Outcome: Progressing   Problem: Elimination: Goal: Will not experience complications related to bowel motility 01/21/2021 1130 by Cristela Blue, RN Outcome: Progressing 01/21/2021 1130 by Cristela Blue, RN Outcome: Progressing 01/21/2021 1100 by Cristela Blue, RN Outcome: Progressing 01/21/2021 1100 by Cristela Blue, RN Outcome: Progressing Goal: Will not experience complications related to urinary retention 01/21/2021 1130 by Cristela Blue, RN Outcome: Progressing 01/21/2021 1130 by Cristela Blue, RN Outcome: Progressing 01/21/2021 1100 by Cristela Blue, RN Outcome: Progressing 01/21/2021 1100 by Cristela Blue, RN Outcome: Progressing   Problem: Pain Managment: Goal: General experience of comfort will improve 01/21/2021 1130 by Cristela Blue, RN Outcome: Progressing 01/21/2021 1130 by Cristela Blue, RN Outcome: Progressing 01/21/2021 1100 by Cristela Blue, RN Outcome: Progressing 01/21/2021 1100 by Cristela Blue, RN Outcome: Progressing   Problem: Safety: Goal: Ability to remain free from injury will improve 01/21/2021 1130 by Cristela Blue, RN Outcome: Progressing 01/21/2021 1130 by Cristela Blue, RN Outcome: Progressing 01/21/2021 1100 by Cristela Blue, RN Outcome: Progressing 01/21/2021 1100 by Cristela Blue, RN Outcome: Progressing   Problem: Skin Integrity: Goal: Risk for impaired skin integrity will decrease 01/21/2021 1130 by Cristela Blue, RN Outcome: Progressing 01/21/2021 1130 by Cristela Blue, RN Outcome: Progressing 01/21/2021 1100 by Cristela Blue, RN Outcome: Progressing 01/21/2021 1100 by Cristela Blue, RN Outcome: Progressing

## 2021-01-21 NOTE — Plan of Care (Signed)
Problem: Education: Goal: Ability to demonstrate management of disease process will improve 01/21/2021 1100 by Cristela Blue, RN Outcome: Progressing 01/21/2021 1100 by Cristela Blue, RN Outcome: Progressing 01/21/2021 1100 by Cristela Blue, RN Outcome: Progressing Goal: Ability to verbalize understanding of medication therapies will improve 01/21/2021 1100 by Cristela Blue, RN Outcome: Progressing 01/21/2021 1100 by Cristela Blue, RN Outcome: Progressing 01/21/2021 1100 by Cristela Blue, RN Outcome: Progressing Goal: Individualized Educational Video(s) 01/21/2021 1100 by Cristela Blue, RN Outcome: Progressing 01/21/2021 1100 by Cristela Blue, RN Outcome: Progressing 01/21/2021 1100 by Cristela Blue, RN Outcome: Progressing   Problem: Activity: Goal: Capacity to carry out activities will improve 01/21/2021 1100 by Cristela Blue, RN Outcome: Progressing 01/21/2021 1100 by Cristela Blue, RN Outcome: Progressing 01/21/2021 1100 by Cristela Blue, RN Outcome: Progressing   Problem: Cardiac: Goal: Ability to achieve and maintain adequate cardiopulmonary perfusion will improve 01/21/2021 1100 by Cristela Blue, RN Outcome: Progressing 01/21/2021 1100 by Cristela Blue, RN Outcome: Progressing 01/21/2021 1100 by Cristela Blue, RN Outcome: Progressing   Problem: Education: Goal: Knowledge of General Education information will improve Description: Including pain rating scale, medication(s)/side effects and non-pharmacologic comfort measures 01/21/2021 1100 by Cristela Blue, RN Outcome: Progressing 01/21/2021 1100 by Cristela Blue, RN Outcome: Progressing 01/21/2021 1100 by Cristela Blue, RN Outcome: Progressing   Problem: Health Behavior/Discharge Planning: Goal: Ability to manage health-related needs will improve 01/21/2021 1100 by Cristela Blue, RN Outcome: Progressing 01/21/2021 1100 by Cristela Blue, RN Outcome: Progressing 01/21/2021 1100 by  Cristela Blue, RN Outcome: Progressing   Problem: Clinical Measurements: Goal: Ability to maintain clinical measurements within normal limits will improve 01/21/2021 1100 by Cristela Blue, RN Outcome: Progressing 01/21/2021 1100 by Cristela Blue, RN Outcome: Progressing 01/21/2021 1100 by Cristela Blue, RN Outcome: Progressing Goal: Will remain free from infection 01/21/2021 1100 by Cristela Blue, RN Outcome: Progressing 01/21/2021 1100 by Cristela Blue, RN Outcome: Progressing 01/21/2021 1100 by Cristela Blue, RN Outcome: Progressing Goal: Diagnostic test results will improve 01/21/2021 1100 by Cristela Blue, RN Outcome: Progressing 01/21/2021 1100 by Cristela Blue, RN Outcome: Progressing 01/21/2021 1100 by Cristela Blue, RN Outcome: Progressing Goal: Respiratory complications will improve 01/21/2021 1100 by Cristela Blue, RN Outcome: Progressing 01/21/2021 1100 by Cristela Blue, RN Outcome: Progressing 01/21/2021 1100 by Cristela Blue, RN Outcome: Progressing Goal: Cardiovascular complication will be avoided 01/21/2021 1100 by Cristela Blue, RN Outcome: Progressing 01/21/2021 1100 by Cristela Blue, RN Outcome: Progressing 01/21/2021 1100 by Cristela Blue, RN Outcome: Progressing   Problem: Activity: Goal: Risk for activity intolerance will decrease 01/21/2021 1100 by Cristela Blue, RN Outcome: Progressing 01/21/2021 1100 by Cristela Blue, RN Outcome: Progressing 01/21/2021 1100 by Cristela Blue, RN Outcome: Progressing   Problem: Nutrition: Goal: Adequate nutrition will be maintained 01/21/2021 1100 by Cristela Blue, RN Outcome: Progressing 01/21/2021 1100 by Cristela Blue, RN Outcome: Progressing 01/21/2021 1100 by Cristela Blue, RN Outcome: Progressing   Problem: Coping: Goal: Level of anxiety will decrease 01/21/2021 1100 by Cristela Blue, RN Outcome: Progressing 01/21/2021 1100 by Cristela Blue, RN Outcome:  Progressing 01/21/2021 1100 by Cristela Blue, RN Outcome: Progressing   Problem: Elimination: Goal: Will not experience complications related to bowel motility 01/21/2021 1100 by Cristela Blue, RN Outcome: Progressing 01/21/2021 1100 by Cristela Blue, RN Outcome: Progressing 01/21/2021 1100 by Cristela Blue, RN Outcome: Progressing Goal: Will not experience complications related to urinary retention 01/21/2021 1100 by Cristela Blue, RN Outcome: Progressing 01/21/2021 1100 by Cristela Blue, RN Outcome: Progressing 01/21/2021 1100 by Cristela Blue, RN Outcome: Progressing   Problem: Pain Managment: Goal: General experience  of comfort will improve 01/21/2021 1100 by Cristela Blue, RN Outcome: Progressing 01/21/2021 1100 by Cristela Blue, RN Outcome: Progressing 01/21/2021 1100 by Cristela Blue, RN Outcome: Progressing   Problem: Safety: Goal: Ability to remain free from injury will improve 01/21/2021 1100 by Cristela Blue, RN Outcome: Progressing 01/21/2021 1100 by Cristela Blue, RN Outcome: Progressing 01/21/2021 1100 by Cristela Blue, RN Outcome: Progressing   Problem: Skin Integrity: Goal: Risk for impaired skin integrity will decrease 01/21/2021 1100 by Cristela Blue, RN Outcome: Progressing 01/21/2021 1100 by Cristela Blue, RN Outcome: Progressing 01/21/2021 1100 by Cristela Blue, RN Outcome: Progressing

## 2021-01-21 NOTE — Progress Notes (Addendum)
Physical Therapy Evaluation Patient Details Name: Anthony Weber MRN: [id number] DOB: 29-Dec-1945 Today's Date: 01/21/2021  History of Present Illness  75 y.o. Caucasian male with medical history significant for depression, hypertension, diastolic CHF and hypothyroidism, presented to the emergency room with acute onset of worsening lower extremity edema with associated abdominal distention, recent dyspnea at rest and on exertion.  He admitted to orthopnea and paroxysmal nocturnal dyspnea.  He gained 15 pounds over the last couple of weeks.  No fever or chills.  He denies any cough or wheezing  Clinical Impression  Pt alert, spouse present throughout treatment. Pt is oriented x 4 and cooperative throughout treatment. At baseline pt is indep for mobility for short household distances utilizing a wall or furniture for support. Attempted to amb at baseline function without AD but pt demonstrated instability and decrease in functional mobility. Pt hesitant to utilize RW, but overall agreeable following education. Gait speed and stability improved w/ MIN-G for safety w/ amb. SpO2 89-92% throughout all mobility. HHPT recommended at discharge for safety with AD and proper usage, decrease endurance, balance, and strength. Skilled PT intervention is indicated to address deficits in function, mobility, and to return to PLOF as able.       Recommendations for follow up therapy are one component of a multi-disciplinary discharge planning process, led by the attending physician.  Recommendations may be updated based on patient status, additional functional criteria and insurance authorization.  Follow Up Recommendations Home health PT    Equipment Recommendations  Rolling walker with 5" wheels (bariatric)    Recommendations for Other Services       Precautions / Restrictions Precautions Precautions: Fall Restrictions Weight Bearing Restrictions: No      Mobility  Bed Mobility Overal bed  mobility: Needs Assistance Bed Mobility: Supine to Sit     Supine to sit: Min assist;HOB elevated Sit to supine: Supervision   General bed mobility comments: Assistance w/ trunk momentarily coming to EOB; stand-by assist returning to bed, no physical assist provided. Pt able to utilize bed rails for scooting to [facility]    Transfers Overall transfer level: Needs assistance Equipment used: Rolling walker (2 wheeled) Transfers: Sit to/from Stand Sit to Stand: Min guard         General transfer comment: STS x 1 w/ no AD or HHA, pt relies on bed rails, reaches for counter upon standing; Provided RW w/ cues for technqiue w/ improved balance and stability upon standing  Ambulation/Gait   Gait Distance (Feet): 65 Feet Assistive device: Rolling walker (2 wheeled) Gait Pattern/deviations: Decreased step length - right;Decreased step length - left;Decreased dorsiflexion - left;Decreased dorsiflexion - right Gait velocity: Moderate w/ AD   General Gait Details: Pt is unable to stand or amb w/o utilizing wall or furniture;increased gait speed and stability w/ RW  Stairs            Wheelchair Mobility    Modified Rankin (Stroke Patients Only)       Balance Overall balance assessment: Needs assistance Sitting-balance support: Feet supported;Bilateral upper extremity supported Sitting balance-Leahy Scale: Fair Sitting balance - Comments: Able to sit statically w/o UE support, but pt prefers BUE usage   Standing balance support: No upper extremity supported;During functional activity Standing balance-Leahy Scale: Poor Standing balance comment: Requires assist for dynamic balance, FAIR balance w/ AD usage  Pertinent Vitals/Pain Pain Assessment: No/denies pain    Home Living Family/patient expects to be discharged to:: Private residence Living Arrangements: Spouse/significant other Available Help at Discharge: Family;Available 24  hours/day Type of Home: House Home Access: Stairs to enter Entrance Stairs-Rails: Left Entrance Stairs-Number of Steps: 2 Home Layout: One level Home Equipment: Cane - single point      Prior Function Level of Independence: Independent         Comments: Indep w/ short household distances w/ support from walls, furniture. Pt utilizes an elliptical for exercises nightly.     Hand Dominance   Dominant Hand: Right    Extremity/Trunk Assessment   Upper Extremity Assessment Upper Extremity Assessment: Overall WFL for tasks assessed    Lower Extremity Assessment Lower Extremity Assessment: Overall WFL for tasks assessed;Generalized weakness       Communication   Communication: No difficulties  Cognition Arousal/Alertness: Awake/alert Behavior During Therapy: WFL for tasks assessed/performed Overall Cognitive Status: Within Functional Limits for tasks assessed                                 General Comments: oriented x 4      General Comments General comments (skin integrity, edema, etc.): BLE edema, feet dry, no redness or warmth    Exercises Other Exercises Other Exercises: Pt edu on RW usage and safety (pt does not utilize AD at baseline). Pt agreeable to RW usage following edu   Assessment/Plan    PT Assessment Patient needs continued PT services  PT Problem List Decreased strength;Decreased range of motion;Decreased activity tolerance;Decreased balance;Decreased mobility       PT Treatment Interventions Balance training;Gait training;Stair training;Functional mobility training;Therapeutic activities;Therapeutic exercise;Neuromuscular re-education    PT Goals (Current goals can be found in the Care Plan section)  Acute Rehab PT Goals Patient Stated Goal: to return home PT Goal Formulation: With patient Time For Goal Achievement: 02/04/21 Potential to Achieve Goals: Good    Frequency Min 2X/week   Barriers to discharge         Co-evaluation               AM-PAC PT "6 Clicks" Mobility  Outcome Measure Help needed turning from your back to your side while in a flat bed without using bedrails?: A Little Help needed moving from lying on your back to sitting on the side of a flat bed without using bedrails?: A Little Help needed moving to and from a bed to a chair (including a wheelchair)?: A Little Help needed standing up from a chair using your arms (e.g., wheelchair or bedside chair)?: None Help needed to walk in hospital room?: A Little Help needed climbing 3-5 steps with a railing? : A Lot 6 Click Score: 18    End of Session Equipment Utilized During Treatment: Gait belt Activity Tolerance: Patient tolerated treatment well Patient left: in bed;with call bell/phone within reach;with family/visitor present;with bed alarm set Nurse Communication: Mobility status PT Visit Diagnosis: Other abnormalities of gait and mobility (R26.89);Muscle weakness (generalized) (M62.81)    Time: 1638-4665 PT Time Calculation (min) (ACUTE ONLY): 24 min   Charges:            [employer], SPT

## 2021-01-21 NOTE — Progress Notes (Addendum)
Occupational Therapy Treatment Patient Details Name: Anthony Weber MRN: [id number] DOB: 24-Feb-1946 Today's Date: 01/21/2021   History of present illness 75 y.o. Caucasian male with medical history significant for depression, hypertension, diastolic CHF and hypothyroidism, presented to the emergency room with acute onset of worsening lower extremity edema with associated abdominal distention, recent dyspnea at rest and on exertion.  He admitted to orthopnea and paroxysmal nocturnal dyspnea.  He gained 15 pounds over the last couple of weeks.  No fever or chills.  He denies any cough or wheezing   OT comments  Mr. Quirk seen for OT treatment on this date. Upon arrival to room pt awake/alert. Pt seated upright in bed with family present. Pt agreeable to tx. Pt instructed in ECS and hand placement/safety when using RW. Pt requires SUP + RW sit<>stand x6 from EOB, VCs for hand placement and safety. MIN A for  perihygeine, standing, therapist assisted for thoroughness. SUP + RW ~50 ft ambulated in hallway. Pt return demonstrated of instruction provided. Pt making good progress toward goals. Pt continues to benefit from skilled OT services to maximize return to PLOF and minimize risk of future falls, injury, caregiver burden, and readmission. Will continue to follow POC. Discharge recommendation remains appropriate.     Recommendations for follow up therapy are one component of a multi-disciplinary discharge planning process, led by the attending physician.  Recommendations may be updated based on patient status, additional functional criteria and insurance authorization.    Follow Up Recommendations     No OT follow up, intermittent supervision  Equipment Recommendations  Other (comment) RW   Recommendations for Other Services      Precautions / Restrictions Precautions Precautions: Fall Restrictions Weight Bearing Restrictions: No       Mobility Bed Mobility Overal bed mobility:  Needs Assistance Bed Mobility: Supine to Sit     Supine to sit: Modified independent (Device/Increase time);HOB elevated Sit to supine: Supervision      Transfers Overall transfer level: Needs assistance Equipment used: Rolling walker (2 wheeled) Transfers: Sit to/from Stand Sit to Stand: Min guard             Balance Overall balance assessment: Needs assistance Sitting-balance support: Feet supported Sitting balance-Leahy Scale: Fair    Standing balance support: No upper extremity supported Standing balance-Leahy Scale: Fair                            ADL either performed or assessed with clinical judgement   ADL Overall ADL's : Needs assistance/impaired                                       General ADL Comments: Pt SUP + RW sit<>stand x6 from EOB, VCs for hand placement and safety. MIN A for  perihygeine, standing, therapist assisted for thoroughness. SUP + RW ~50 ft ambulated in hallway.      Cognition Arousal/Alertness: Awake/alert Behavior During Therapy: WFL for tasks assessed/performed Overall Cognitive Status: Within Functional Limits for tasks assessed                                         Exercises Exercises: Other exercises Other Exercises Other Exercises: Pt edu on RW usage/ safety, importance of movement for preserving functional movement, ECS,  falls prevention Other Exercises: sup<>sit, sit<>stand x 6, standing perihygeine, hallway ambulation   Shoulder Instructions       General Comments BLE edema    Pertinent Vitals/ Pain       Pain Assessment: No/denies pain  Home Living Family/patient expects to be discharged to:: Private residence Living Arrangements: Spouse/significant other Available Help at Discharge: Family;Available 24 hours/day Type of Home: House Home Access: Stairs to enter [employer] of Steps: 2 Entrance Stairs-Rails: Left Home Layout: One level     Bathroom  Shower/Tub: Walk-in shower         Home Equipment: Cane - single point          Prior Functioning/Environment Level of Independence: Independent        Comments: Indep w/ short household distances w/ support from walls, furniture. Pt utilizes an elliptical for exercises nightly.   Frequency  Min 2X/week        Progress Toward Goals  OT Goals(current goals can now be found in the care plan section)  Progress towards OT goals: Progressing toward goals  Acute Rehab OT Goals Patient Stated Goal: to return home OT Goal Formulation: With patient/family Time For Goal Achievement: 02/03/21 Potential to Achieve Goals: Good ADL Goals Pt Will Perform Grooming: with modified independence;standing Pt Will Perform Lower Body Dressing: with supervision;sit to/from stand Pt Will Transfer to Toilet: with supervision;ambulating Pt Will Perform Toileting - Clothing Manipulation and hygiene: with supervision;sit to/from stand  Plan Discharge plan remains appropriate    Co-evaluation                 AM-PAC OT "6 Clicks" Daily Activity     Outcome Measure   Help from another person eating meals?: None Help from another person taking care of personal grooming?: A Little Help from another person toileting, which includes using toliet, bedpan, or urinal?: A Little Help from another person bathing (including washing, rinsing, drying)?: A Little Help from another person to put on and taking off regular upper body clothing?: None Help from another person to put on and taking off regular lower body clothing?: A Little 6 Click Score: 20    End of Session Equipment Utilized During Treatment: Rolling walker  OT Visit Diagnosis: Unsteadiness on feet (R26.81);Muscle weakness (generalized) (M62.81)   Activity Tolerance Patient tolerated treatment well   Patient Left in bed;with call bell/phone within reach;with bed alarm set;with family/visitor present   Nurse Communication           Time: 8182-9937 OT Time Calculation (min): 22 min  Charges:    Nino Glow, Markus Daft 01/21/2021, 11:45 AM

## 2021-01-21 NOTE — Progress Notes (Signed)
Progress Note  Patient Name: Anthony Weber Date of Encounter: 01/21/2021  Primary Cardiologist: Dr. Rockey Situ   Subjective   Continues to note improved breathing. No CP. Walked around the hall twice earlier today without any concerning sx. They wonder about the catheterization which we discussed to occur later this week with hopeful date of cath Friday depending on volume status.   Inpatient Medications    Scheduled Meds:  aspirin  81 mg Oral Daily   carvedilol  3.125 mg Oral BID WC   diphenhydrAMINE  25 mg Oral QHS   enoxaparin (LOVENOX) injection  0.5 mg/kg Subcutaneous Q24H   furosemide  40 mg Intravenous Q12H   levothyroxine  50 mcg Oral Q0600   losartan  50 mg Oral Daily   melatonin  5 mg Oral QHS   pravastatin  40 mg Oral Daily   Continuous Infusions:  PRN Meds: acetaminophen **OR** acetaminophen, magnesium hydroxide, ondansetron **OR** ondansetron (ZOFRAN) IV   Vital Signs    Vitals:   01/21/21 0236 01/21/21 0510 01/21/21 0748 01/21/21 1131  BP: 99/63  106/67 98/63  Pulse: 68  70 87  Resp: 17  17 18   Temp: 97.8 F (36.6 C)  98.1 F (36.7 C) 98.2 F (36.8 C)  TempSrc: Oral   Oral  SpO2: 93%  96% 93%  Weight:  (!) 148.8 kg    Height:        Intake/Output Summary (Last 24 hours) at 01/21/2021 1239 Last data filed at 01/21/2021 1132 Gross per 24 hour  Intake 1440 ml  Output 2800 ml  Net -1360 ml    Last 3 Weights 01/21/2021 01/20/2021 01/19/2021  Weight (lbs) 328 lb 1.6 oz 331 lb 4.8 oz 346 lb 1.6 oz  Weight (kg) 148.825 kg 150.277 kg 156.99 kg      Telemetry    Sinus rhythm with PVCs- Personally Reviewed  ECG    No new tracings- Personally Reviewed  Physical Exam   GEN: Obese male.  No acute distress.  Joined by wife Neck: JVD difficult to assess due to body habitus Cardiac: RRR, no murmurs, rubs, or gallops.  Respiratory: Distant heart sounds but otherwise clear to auscultation bilaterally. GI: Improving / less firm and  distended than previously MS: Bilateral 1+ lower extremity edema. No deformity. Neuro:  Nonfocal  Psych: Normal affect   Labs    High Sensitivity Troponin:   Recent Labs  Lab 01/18/21 1654 01/19/21 1238  TROPONINIHS 20* 22*       Chemistry Recent Labs  Lab 01/18/21 1654 01/19/21 0426 01/20/21 0806 01/21/21 0555  NA 136 137 137 134*  K 4.9 4.0 4.1 4.1  CL 100 100 94* 93*  CO2 31 31 34* 33*  GLUCOSE 103* 115* 99 102*  BUN 18 19 19  25*  CREATININE 0.80 0.77 0.84 0.87  CALCIUM 8.7* 8.9 9.0 8.7*  PROT 6.2*  --   --   --   ALBUMIN 3.4*  --   --   --   AST 21  --   --   --   ALT 17  --   --   --   ALKPHOS 41  --   --   --   BILITOT 1.0  --   --   --   GFRNONAA >60 >60 >60 >60  ANIONGAP 5 6 9 8       Hematology Recent Labs  Lab 01/18/21 1654 01/19/21 0426  WBC 8.4 8.9  RBC 4.54 4.26  HGB 13.4 12.9*  HCT 42.1 39.4  MCV 92.7 92.5  MCH 29.5 30.3  MCHC 31.8 32.7  RDW 15.1 15.1  PLT 151 129*     BNP Recent Labs  Lab 01/18/21 1654 01/19/21 1237  BNP 951.0* 942.4*      DDimer No results for input(s): DDIMER in the last 168 hours.   Radiology    ECHOCARDIOGRAM COMPLETE  Result Date: 01/19/2021    ECHOCARDIOGRAM REPORT   Patient Name:   Anthony Weber Date of Exam: 01/19/2021 Medical Rec #:  [id number]            Height:       70.0 in Accession #:    [id number]           Weight:       346.1 lb Date of Birth:  October 30, 1945            BSA:          2.635 m Patient Age:    46 years             BP:           111/56 mmHg Patient Gender: M                    HR:           62 bpm. Exam Location:  [facility] Procedure: 2D Echo, Cardiac Doppler, Color Doppler and Intracardiac            Opacification Agent Indications:     CHF I50.9  History:         Patient has prior history of Echocardiogram examinations, most                  recent 04/18/2020. Risk Factors:Hypertension.  Sonographer:     Sherrie Sport Referring Phys:  [id number] Robin Searing Juriel Cid Diagnosing Phys: Kathlyn Sacramento MD  Sonographer Comments: Technically difficult study due to poor echo windows. IMPRESSIONS  1. Left ventricular ejection fraction, by estimation, is 25 to 30%. The left ventricle has severely decreased function. Left ventricular endocardial border not optimally defined to evaluate regional wall motion. Left ventricular diastolic parameters are  indeterminate.  2. Right ventricular systolic function was not well visualized. The right ventricular size is normal. Tricuspid regurgitation signal is inadequate for assessing PA pressure.  3. The mitral valve was not well visualized. No evidence of mitral valve regurgitation. No evidence of mitral stenosis.  4. The aortic valve was not well visualized. Aortic valve regurgitation is not visualized. No aortic stenosis is present.  5. Technically difficult study due to poor echo windows.     Very limited study even with contrast agent. Consider alternative testing. FINDINGS  Left Ventricle: Left ventricular ejection fraction, by estimation, is 25 to 30%. The left ventricle has severely decreased function. Left ventricular endocardial border not optimally defined to evaluate regional wall motion. Definity contrast agent was given IV to delineate the left ventricular endocardial borders. The left ventricular internal cavity size was normal in size. There is no left ventricular hypertrophy. Left ventricular diastolic parameters are indeterminate. Right Ventricle: The right ventricular size is normal. No increase in right ventricular wall thickness. Right ventricular systolic function was not well visualized. Tricuspid regurgitation signal is inadequate for assessing PA pressure. Left Atrium: Left atrial size was not well visualized. Right Atrium: Right atrial size was not well visualized. Pericardium: There is no evidence of pericardial effusion. Mitral Valve: The mitral valve was not well visualized. No evidence  of mitral valve regurgitation. No evidence of mitral valve  stenosis. Tricuspid Valve: The tricuspid valve is not well visualized. Tricuspid valve regurgitation is not demonstrated. No evidence of tricuspid stenosis. Aortic Valve: The aortic valve was not well visualized. Aortic valve regurgitation is not visualized. No aortic stenosis is present. Aortic valve mean gradient measures 1.0 mmHg. Aortic valve peak gradient measures 1.4 mmHg. Pulmonic Valve: The pulmonic valve was not well visualized. Pulmonic valve regurgitation is not visualized. No evidence of pulmonic stenosis. Aorta: The aortic root was not well visualized. Venous: The inferior vena cava was not well visualized. IAS/Shunts: No atrial level shunt detected by color flow Doppler.  LEFT VENTRICLE PLAX 2D LVIDd:         5.23 cm Diastology LVIDs:         4.67 cm LV e' medial:   6.74 cm/s LV PW:         1.22 cm LV E/e' medial: 13.1 LV IVS:        1.18 cm  RIGHT VENTRICLE RV S prime:     5.00 cm/s AORTIC VALVE                   PULMONIC VALVE AV Vmax:           58.80 cm/s  PV Vmax:        0.51 m/s AV Vmean:          40.000 cm/s PV Peak grad:   1.0 mmHg AV VTI:            0.074 m     RVOT Peak grad: 2 mmHg AV Peak Grad:      1.4 mmHg AV Mean Grad:      1.0 mmHg LVOT Vmax:         26.10 cm/s LVOT Vmean:        22.000 cm/s LVOT VTI:          0.045 m LVOT/AV VTI ratio: 0.61 MITRAL VALVE               TRICUSPID VALVE MV Area (PHT): 4.89 cm    TR Peak grad:   6.8 mmHg MV Decel Time: 155 msec    TR Vmax:        130.00 cm/s MV E velocity: 88.30 cm/s MV A velocity: 51.80 cm/s  SHUNTS MV E/A ratio:  1.70        Systemic VTI: 0.05 m Kathlyn Sacramento MD Electronically signed by Kathlyn Sacramento MD Signature Date/Time: 01/19/2021/2:21:20 PM    Final     Cardiac Studies   Echo 01/19/2021  1. Left ventricular ejection fraction, by estimation, is 25 to 30%. The  left ventricle has severely decreased function. Left ventricular  endocardial border not optimally defined to evaluate regional wall motion.  Left ventricular  diastolic parameters are   indeterminate.   2. Right ventricular systolic function was not well visualized. The right  ventricular size is normal. Tricuspid regurgitation signal is inadequate  for assessing PA pressure.   3. The mitral valve was not well visualized. No evidence of mitral valve  regurgitation. No evidence of mitral stenosis.   4. The aortic valve was not well visualized. Aortic valve regurgitation  is not visualized. No aortic stenosis is present.   5. Technically difficult study due to poor echo windows.      Very limited study even with contrast agent. Consider alternative  testing.   Echo 04/18/2020  1. Left ventricular ejection fraction, by estimation, is  50 to 55%. The  left ventricle has low normal function. The left ventricle grossly has no  regional wall motion abnormalities. Left ventricular diastolic parameters  are consistent with Grade II  diastolic dysfunction (pseudonormalization).   2. Right ventricular systolic function is normal. The right ventricular  size is normal. Tricuspid regurgitation signal is inadequate for assessing  PA pressure.   3. Left atrial size was moderately dilated.   4. Challenging images   MPI 03/2020 Pharmacological myocardial perfusion imaging study with no significant ischemia Small region of apical thinning, likely secondary to attenuation artifact Global hypokinesis, EF estimated at 25% No EKG changes concerning for ischemia at peak stress or in recovery. PVCs noted postinfusion CT attenuation correction images with mild aortic atherosclerosis in the arch, coronary calcification Moderate risk scan, consider confirmation of ejection fraction with echocardiogram    Patient Profile     75 y.o. male with history of newly reduced EF 25 to 30%, hypertension, obesity, hyperlipidemia, prior pneumonia in 2013, and who is being seen today for the evaluation of exacerbation of heart failure with echo showing EF reduced from previous  50 to 55% to 01/19/2021 EF 25 to 30% and recommendation for right left heart catheterization for further ischemic work-up and better understanding of hemodynamics.  Assessment & Plan     Acute on chronic HFrEF, newly reduced EF --Improved breathing with IV diuresis. Still volume up on exam. Echo EF 50 to 55%  EF 25-30%. Once euvolemic, R/LHC planned for further ischemic workup of reduced EF and better understanding of hemodynamics.   Continue IV lasix 40mg  BID. Daily BMET.  Cr stable. Monitor I/os, daily weights.   Output -10.4L. Wt 150.3kg  148.8kg CHF education received  Continue losartan & transition to [facility] s/p 36-hour washout of ACE Lisinopril last dose= 10/10. Transition to [facility] tomorrow if BP allows (currently soft BP) Continue Coreg 3.125 mg twice daily Future addition of spironolactone / SGLT2i before discharge. R/LHC once volume status improved.  Pt/ family agreeable to this plan.   Essential hypertension BP well controlled to soft. Continue medications as above.    HLD LDL 51.  Continue statin.     Obesity Lifestyle changes and weight loss advised.  For questions or updates, please contact [place] Please consult [email or web address] for contact info under        Signed, Arvil Chaco, PA-C  01/21/2021, 12:39 PM

## 2021-01-21 NOTE — Progress Notes (Signed)
PROGRESS NOTE    Anthony Weber  BJY:[id number] DOB: November 18, 1945 DOA: 01/18/2021 PCP: Albina Billet, MD    Brief Narrative:  75 y.o. Caucasian male with medical history significant for depression, hypertension, diastolic CHF and hypothyroidism, presented to the emergency room with acute onset of worsening lower extremity edema with associated abdominal distention, recent dyspnea at rest and on exertion.  He admitted to orthopnea and paroxysmal nocturnal dyspnea.  He gained 15 pounds over the last couple of weeks.  No fever or chills.  He denies any cough or wheezing.  No dysuria, oliguria or hematuria or flank pain.  No chest pain or palpitations.  Started on aggressive IV diuresis.  Diuresed effectively.  Symptoms began to improve. 10/12 feels sob improving. No cp  Assessment & Plan:   Active Problems:   Acute CHF (congestive heart failure) (HCC)   Acute HFrEF (heart failure with reduced ejection fraction) (HCC)  Acute combined systolic and diastolic congestive heart failure Last 2D echocardiogram in 2022, EF 50 to 08%, grade 2 diastolic dysfunction 65/78-IONG from 10/10 with EF 25 to 30%. Cardiology following Continue IV diuresis Continue losartan and Coreg Will likely need right and left heart cath once euvolemic  Continue I's and O's, daily weight Monitor renal function  Anasarca Due to above Continue diuresis and treatment as above  Hypothyroidism TSH at 9 Continue PTA Synthroid Free T4 1.17 Free T3 normal  Essential hypertension Normotensive to low  continue losartan and Coreg  Hyperlipidemia Continue statins  Morbid obesity BMI 29.52 This complicates overall care and prognosis  Hyponatremia- mild . Na 134.  Likely 2/2 diuresis   DVT prophylaxis: SQ Lovenox Code Status: Full Family Communication: Family at bedside Disposition Plan: Status is: Inpatient  Remains inpatient appropriate because:Inpatient level of care appropriate due to severity of  illness  Dispo: The patient is from: Home              Anticipated d/c is to: Home              Patient currently is not medically stable to d/c.   Difficult to place patient No       Level of care: Progressive Cardiac  Consultants:  Cardiology  Procedures:  None  Antimicrobials: None   Subjective: Feels shortness of breath is improving slowly.  No chest pain.  Objective: Vitals:   01/21/21 0021 01/21/21 0236 01/21/21 0510 01/21/21 0748  BP: 108/69 99/63  106/67  Pulse: 70 68  70  Resp: 20 17  17   Temp: 97.8 F (36.6 C) 97.8 F (36.6 C)  98.1 F (36.7 C)  TempSrc:  Oral    SpO2: 95% 93%  96%  Weight:   (!) 148.8 kg   Height:        Intake/Output Summary (Last 24 hours) at 01/21/2021 0918 Last data filed at 01/21/2021 0850 Gross per 24 hour  Intake 1680 ml  Output 2200 ml  Net -520 ml   Filed Weights   01/19/21 0338 01/20/21 0504 01/21/21 0510  Weight: (!) 157 kg (!) 150.3 kg (!) 148.8 kg    Examination: NAD, calm Decreased breath sounds at bases no wheezing Regular S1-S2 no gallops Soft benign positive bowel sounds Mild edema lower extremity bilateral AA x O x3 grossly intact Mood and affect appropriate in current setting   Data Reviewed: I have personally reviewed following labs and imaging studies  CBC: Recent Labs  Lab 01/18/21 1654 01/19/21 0426  WBC 8.4 8.9  HGB 13.4 12.9*  HCT 42.1 39.4  MCV 92.7 92.5  PLT 151 353*   Basic Metabolic Panel: Recent Labs  Lab 01/18/21 1654 01/19/21 0426 01/20/21 0806 01/21/21 0555  NA 136 137 137 134*  K 4.9 4.0 4.1 4.1  CL 100 100 94* 93*  CO2 31 31 34* 33*  GLUCOSE 103* 115* 99 102*  BUN 18 19 19  25*  CREATININE 0.80 0.77 0.84 0.87  CALCIUM 8.7* 8.9 9.0 8.7*  MG  --   --  1.9 1.9   GFR: Estimated Creatinine Clearance: 105.5 mL/min (by C-G formula based on SCr of 0.87 mg/dL). Liver Function Tests: Recent Labs  Lab 01/18/21 1654  AST 21  ALT 17  ALKPHOS 41  BILITOT 1.0  PROT  6.2*  ALBUMIN 3.4*   No results for input(s): LIPASE, AMYLASE in the last 168 hours. No results for input(s): AMMONIA in the last 168 hours. Coagulation Profile: No results for input(s): INR, PROTIME in the last 168 hours. Cardiac Enzymes: No results for input(s): CKTOTAL, CKMB, CKMBINDEX, TROPONINI in the last 168 hours. BNP (last 3 results) No results for input(s): PROBNP in the last 8760 hours. HbA1C: No results for input(s): HGBA1C in the last 72 hours. CBG: No results for input(s): GLUCAP in the last 168 hours. Lipid Profile: Recent Labs    01/19/21 1238  CHOL 101  HDL 40*  LDLCALC 51  TRIG 49  CHOLHDL 2.5   Thyroid Function Tests: Recent Labs    01/19/21 1238 01/20/21 1221  TSH 9.059*  --   FREET4  --  1.17*  T3FREE  --  2.2   Anemia Panel: No results for input(s): VITAMINB12, FOLATE, FERRITIN, TIBC, IRON, RETICCTPCT in the last 72 hours. Sepsis Labs: No results for input(s): PROCALCITON, LATICACIDVEN in the last 168 hours.  Recent Results (from the past 240 hour(s))  Resp Panel by RT-PCR (Flu A&B, Covid) Nasopharyngeal Swab     Status: None   Collection Time: 01/18/21  7:54 PM   Specimen: Nasopharyngeal Swab; Nasopharyngeal(NP) swabs in vial transport medium  Result Value Ref Range Status   SARS Coronavirus 2 by RT PCR NEGATIVE NEGATIVE Final    Comment: (NOTE) SARS-CoV-2 target nucleic acids are NOT DETECTED.  The SARS-CoV-2 RNA is generally detectable in upper respiratory specimens during the acute phase of infection. The lowest concentration of SARS-CoV-2 viral copies this assay can detect is 138 copies/mL. A negative result does not preclude SARS-Cov-2 infection and should not be used as the sole basis for treatment or other patient management decisions. A negative result may occur with  improper specimen collection/handling, submission of specimen other than nasopharyngeal swab, presence of viral mutation(s) within the areas targeted by this assay,  and inadequate number of viral copies(<138 copies/mL). A negative result must be combined with clinical observations, patient history, and epidemiological information. The expected result is Negative.  Fact Sheet for Patients:  EntrepreneurPulse.com.au  Fact Sheet for Healthcare Providers:  IncredibleEmployment.be  This test is no t yet approved or cleared by the [place] FDA and  has been authorized for detection and/or diagnosis of SARS-CoV-2 by FDA under an Emergency Use Authorization (EUA). This EUA will remain  in effect (meaning this test can be used) for the duration of the COVID-19 declaration under Section 564(b)(1) of the Act, 21 U.S.C.section 360bbb-3(b)(1), unless the authorization is terminated  or revoked sooner.       Influenza A by PCR NEGATIVE NEGATIVE Final   Influenza B by PCR NEGATIVE NEGATIVE Final  Comment: (NOTE) The Xpert Xpress SARS-CoV-2/FLU/RSV plus assay is intended as an aid in the diagnosis of influenza from Nasopharyngeal swab specimens and should not be used as a sole basis for treatment. Nasal washings and aspirates are unacceptable for Xpert Xpress SARS-CoV-2/FLU/RSV testing.  Fact Sheet for Patients: EntrepreneurPulse.com.au  Fact Sheet for Healthcare Providers: IncredibleEmployment.be  This test is not yet approved or cleared by the [place] FDA and has been authorized for detection and/or diagnosis of SARS-CoV-2 by FDA under an Emergency Use Authorization (EUA). This EUA will remain in effect (meaning this test can be used) for the duration of the COVID-19 declaration under Section 564(b)(1) of the Act, 21 U.S.C. section 360bbb-3(b)(1), unless the authorization is terminated or revoked.  Performed at [facility], [address], [place], [place] [address]          Radiology Studies: ECHOCARDIOGRAM COMPLETE  Result Date: 01/19/2021     ECHOCARDIOGRAM REPORT   Patient Name:   Anthony Weber Date of Exam: 01/19/2021 Medical Rec #:  [id number]            Height:       70.0 in Accession #:    [id number]           Weight:       346.1 lb Date of Birth:  12-07-1945            BSA:          2.635 m Patient Age:    79 years             BP:           111/56 mmHg Patient Gender: M                    HR:           62 bpm. Exam Location:  [facility] Procedure: 2D Echo, Cardiac Doppler, Color Doppler and Intracardiac            Opacification Agent Indications:     CHF I50.9  History:         Patient has prior history of Echocardiogram examinations, most                  recent 04/18/2020. Risk Factors:Hypertension.  Sonographer:     Sherrie Sport Referring Phys:  [id number] Robin Searing VISSER Diagnosing Phys: Kathlyn Sacramento MD  Sonographer Comments: Technically difficult study due to poor echo windows. IMPRESSIONS  1. Left ventricular ejection fraction, by estimation, is 25 to 30%. The left ventricle has severely decreased function. Left ventricular endocardial border not optimally defined to evaluate regional wall motion. Left ventricular diastolic parameters are  indeterminate.  2. Right ventricular systolic function was not well visualized. The right ventricular size is normal. Tricuspid regurgitation signal is inadequate for assessing PA pressure.  3. The mitral valve was not well visualized. No evidence of mitral valve regurgitation. No evidence of mitral stenosis.  4. The aortic valve was not well visualized. Aortic valve regurgitation is not visualized. No aortic stenosis is present.  5. Technically difficult study due to poor echo windows.     Very limited study even with contrast agent. Consider alternative testing. FINDINGS  Left Ventricle: Left ventricular ejection fraction, by estimation, is 25 to 30%. The left ventricle has severely decreased function. Left ventricular endocardial border not optimally defined to evaluate regional wall motion. Definity  contrast agent was given IV to delineate the left ventricular endocardial borders. The left ventricular  internal cavity size was normal in size. There is no left ventricular hypertrophy. Left ventricular diastolic parameters are indeterminate. Right Ventricle: The right ventricular size is normal. No increase in right ventricular wall thickness. Right ventricular systolic function was not well visualized. Tricuspid regurgitation signal is inadequate for assessing PA pressure. Left Atrium: Left atrial size was not well visualized. Right Atrium: Right atrial size was not well visualized. Pericardium: There is no evidence of pericardial effusion. Mitral Valve: The mitral valve was not well visualized. No evidence of mitral valve regurgitation. No evidence of mitral valve stenosis. Tricuspid Valve: The tricuspid valve is not well visualized. Tricuspid valve regurgitation is not demonstrated. No evidence of tricuspid stenosis. Aortic Valve: The aortic valve was not well visualized. Aortic valve regurgitation is not visualized. No aortic stenosis is present. Aortic valve mean gradient measures 1.0 mmHg. Aortic valve peak gradient measures 1.4 mmHg. Pulmonic Valve: The pulmonic valve was not well visualized. Pulmonic valve regurgitation is not visualized. No evidence of pulmonic stenosis. Aorta: The aortic root was not well visualized. Venous: The inferior vena cava was not well visualized. IAS/Shunts: No atrial level shunt detected by color flow Doppler.  LEFT VENTRICLE PLAX 2D LVIDd:         5.23 cm Diastology LVIDs:         4.67 cm LV e' medial:   6.74 cm/s LV PW:         1.22 cm LV E/e' medial: 13.1 LV IVS:        1.18 cm  RIGHT VENTRICLE RV S prime:     5.00 cm/s AORTIC VALVE                   PULMONIC VALVE AV Vmax:           58.80 cm/s  PV Vmax:        0.51 m/s AV Vmean:          40.000 cm/s PV Peak grad:   1.0 mmHg AV VTI:            0.074 m     RVOT Peak grad: 2 mmHg AV Peak Grad:      1.4 mmHg AV Mean Grad:       1.0 mmHg LVOT Vmax:         26.10 cm/s LVOT Vmean:        22.000 cm/s LVOT VTI:          0.045 m LVOT/AV VTI ratio: 0.61 MITRAL VALVE               TRICUSPID VALVE MV Area (PHT): 4.89 cm    TR Peak grad:   6.8 mmHg MV Decel Time: 155 msec    TR Vmax:        130.00 cm/s MV E velocity: 88.30 cm/s MV A velocity: 51.80 cm/s  SHUNTS MV E/A ratio:  1.70        Systemic VTI: 0.05 m Kathlyn Sacramento MD Electronically signed by Kathlyn Sacramento MD Signature Date/Time: 01/19/2021/2:21:20 PM    Final         Scheduled Meds:  aspirin  81 mg Oral Daily   carvedilol  3.125 mg Oral BID WC   diphenhydrAMINE  25 mg Oral QHS   enoxaparin (LOVENOX) injection  0.5 mg/kg Subcutaneous Q24H   furosemide  40 mg Intravenous Q12H   levothyroxine  50 mcg Oral Q0600   losartan  50 mg Oral Daily   melatonin  5 mg Oral QHS  pravastatin  40 mg Oral Daily   Continuous Infusions:   LOS: 3 days    Time spent: 45 mins than 50% on [place], MD [facility]   If 7PM-7AM, please contact night-coverage  01/21/2021, 9:18 AM

## 2021-01-21 NOTE — Progress Notes (Signed)
PT Cancellation Note  Patient Details Name: Anthony Weber MRN: [id number] DOB: January 01, 1946   Cancelled Treatment:    Reason Eval/Treat Not Completed: Other (comment) Pt fatigued, requested later session. PT to reassess as able.  [employer], SPT

## 2021-01-22 DIAGNOSIS — R600 Localized edema: Secondary | ICD-10-CM

## 2021-01-22 DIAGNOSIS — I10 Essential (primary) hypertension: Secondary | ICD-10-CM | POA: Diagnosis not present

## 2021-01-22 DIAGNOSIS — E039 Hypothyroidism, unspecified: Secondary | ICD-10-CM | POA: Diagnosis not present

## 2021-01-22 DIAGNOSIS — I5041 Acute combined systolic (congestive) and diastolic (congestive) heart failure: Secondary | ICD-10-CM | POA: Diagnosis not present

## 2021-01-22 LAB — MAGNESIUM: Magnesium: 2.2 mg/dL (ref 1.7–2.4)

## 2021-01-22 LAB — BASIC METABOLIC PANEL
Anion gap: 7 (ref 5–15)
BUN: 22 mg/dL (ref 8–23)
CO2: 32 mmol/L (ref 22–32)
Calcium: 8.4 mg/dL — ABNORMAL LOW (ref 8.9–10.3)
Chloride: 95 mmol/L — ABNORMAL LOW (ref 98–111)
Creatinine, Ser: 0.76 mg/dL (ref 0.61–1.24)
GFR, Estimated: >60 mL/min (ref >60)
Glucose, Bld: 89 mg/dL (ref 70–99)
Potassium: 3.9 mmol/L (ref 3.5–5.1)
Sodium: 134 mmol/L — ABNORMAL LOW (ref 135–145)

## 2021-01-22 NOTE — Plan of Care (Signed)
  Problem: Education: Goal: Ability to demonstrate management of disease process will improve 01/22/2021 1141 by Cristela Blue, RN Outcome: Progressing 01/22/2021 1141 by Cristela Blue, RN Outcome: Progressing Goal: Ability to verbalize understanding of medication therapies will improve 01/22/2021 1141 by Cristela Blue, RN Outcome: Progressing 01/22/2021 1141 by Cristela Blue, RN Outcome: Progressing Goal: Individualized Educational Video(s) 01/22/2021 1141 by Cristela Blue, RN Outcome: Progressing 01/22/2021 1141 by Cristela Blue, RN Outcome: Progressing   Problem: Activity: Goal: Capacity to carry out activities will improve 01/22/2021 1141 by Cristela Blue, RN Outcome: Progressing 01/22/2021 1141 by Cristela Blue, RN Outcome: Progressing   Problem: Cardiac: Goal: Ability to achieve and maintain adequate cardiopulmonary perfusion will improve 01/22/2021 1141 by Cristela Blue, RN Outcome: Progressing 01/22/2021 1141 by Cristela Blue, RN Outcome: Progressing   Problem: Education: Goal: Knowledge of General Education information will improve Description: Including pain rating scale, medication(s)/side effects and non-pharmacologic comfort measures 01/22/2021 1141 by Cristela Blue, RN Outcome: Progressing 01/22/2021 1141 by Cristela Blue, RN Outcome: Progressing   Problem: Health Behavior/Discharge Planning: Goal: Ability to manage health-related needs will improve 01/22/2021 1141 by Cristela Blue, RN Outcome: Progressing 01/22/2021 1141 by Cristela Blue, RN Outcome: Progressing   Problem: Clinical Measurements: Goal: Ability to maintain clinical measurements within normal limits will improve 01/22/2021 1141 by Cristela Blue, RN Outcome: Progressing 01/22/2021 1141 by Cristela Blue, RN Outcome: Progressing Goal: Will remain free from infection 01/22/2021 1141 by Cristela Blue, RN Outcome: Progressing 01/22/2021 1141 by Cristela Blue, RN Outcome:  Progressing Goal: Diagnostic test results will improve 01/22/2021 1141 by Cristela Blue, RN Outcome: Progressing 01/22/2021 1141 by Cristela Blue, RN Outcome: Progressing Goal: Respiratory complications will improve 01/22/2021 1141 by Cristela Blue, RN Outcome: Progressing 01/22/2021 1141 by Cristela Blue, RN Outcome: Progressing Goal: Cardiovascular complication will be avoided 01/22/2021 1141 by Cristela Blue, RN Outcome: Progressing 01/22/2021 1141 by Cristela Blue, RN Outcome: Progressing   Problem: Activity: Goal: Risk for activity intolerance will decrease 01/22/2021 1141 by Cristela Blue, RN Outcome: Progressing 01/22/2021 1141 by Cristela Blue, RN Outcome: Progressing   Problem: Nutrition: Goal: Adequate nutrition will be maintained 01/22/2021 1141 by Cristela Blue, RN Outcome: Progressing 01/22/2021 1141 by Cristela Blue, RN Outcome: Progressing   Problem: Coping: Goal: Level of anxiety will decrease 01/22/2021 1141 by Cristela Blue, RN Outcome: Progressing 01/22/2021 1141 by Cristela Blue, RN Outcome: Progressing   Problem: Elimination: Goal: Will not experience complications related to bowel motility 01/22/2021 1141 by Cristela Blue, RN Outcome: Progressing 01/22/2021 1141 by Cristela Blue, RN Outcome: Progressing Goal: Will not experience complications related to urinary retention 01/22/2021 1141 by Cristela Blue, RN Outcome: Progressing 01/22/2021 1141 by Cristela Blue, RN Outcome: Progressing   Problem: Pain Managment: Goal: General experience of comfort will improve 01/22/2021 1141 by Cristela Blue, RN Outcome: Progressing 01/22/2021 1141 by Cristela Blue, RN Outcome: Progressing   Problem: Safety: Goal: Ability to remain free from injury will improve 01/22/2021 1141 by Cristela Blue, RN Outcome: Progressing 01/22/2021 1141 by Cristela Blue, RN Outcome: Progressing   Problem: Skin Integrity: Goal: Risk for impaired  skin integrity will decrease 01/22/2021 1141 by Cristela Blue, RN Outcome: Progressing 01/22/2021 1141 by Cristela Blue, RN Outcome: Progressing

## 2021-01-22 NOTE — Progress Notes (Signed)
Progress Note  Patient Name: Anthony Weber Date of Encounter: 01/22/2021  [facility] Cardiologist: Dr. Rockey Situ  Subjective   UOP -2.1L. Patient denies chest pain. He reports breathing is getting better. Says he is able to lay flat. Eating breakfast during interview. Tele shows brief runs of NSVT.   Inpatient Medications    Scheduled Meds:  aspirin  81 mg Oral Daily   carvedilol  3.125 mg Oral BID WC   diphenhydrAMINE  25 mg Oral QHS   enoxaparin (LOVENOX) injection  0.5 mg/kg Subcutaneous Q24H   furosemide  40 mg Intravenous Q12H   levothyroxine  50 mcg Oral Q0600   losartan  50 mg Oral Daily   melatonin  5 mg Oral QHS   pravastatin  40 mg Oral Daily   Continuous Infusions:  PRN Meds: acetaminophen **OR** acetaminophen, magnesium hydroxide, ondansetron **OR** ondansetron (ZOFRAN) IV   Vital Signs    Vitals:   01/21/21 1600 01/21/21 2108 01/22/21 0018 01/22/21 0600  BP: 109/66 104/63 103/65 102/60  Pulse: 78 76 69 76  Resp:  18 17 18   Temp: 97.8 F (36.6 C) 97.7 F (36.5 C) 97.9 F (36.6 C) 97.6 F (36.4 C)  TempSrc: Oral Oral Oral Oral  SpO2: 96% 96% 96% 95%  Weight:      Height:        Intake/Output Summary (Last 24 hours) at 01/22/2021 0807 Last data filed at 01/22/2021 0500 Gross per 24 hour  Intake 960 ml  Output 2300 ml  Net -1340 ml   Last 3 Weights 01/21/2021 01/20/2021 01/19/2021  Weight (lbs) 328 lb 1.6 oz 331 lb 4.8 oz 346 lb 1.6 oz  Weight (kg) 148.825 kg 150.277 kg 156.99 kg      Telemetry    NSR, HR 80s, frequent PVCs, NSVT 6 beats - Personally Reviewed  ECG    No new - Personally Reviewed  Physical Exam   GEN: No acute distress.   Neck: + JVD Cardiac: RRR, no murmurs, rubs, or gallops.  Respiratory: Clear to auscultation bilaterally. GI: Soft, nontender, non-distended  MS: 1-2+ lower leg edema; No deformity. Neuro:  Nonfocal  Psych: Normal affect   Labs    High Sensitivity Troponin:   Recent Labs  Lab  01/18/21 1654 01/19/21 1238  TROPONINIHS 20* 22*     Chemistry Recent Labs  Lab 01/18/21 1654 01/19/21 0426 01/20/21 0806 01/21/21 0555 01/22/21 0459  NA 136   < > 137 134* 134*  K 4.9   < > 4.1 4.1 3.9  CL 100   < > 94* 93* 95*  CO2 31   < > 34* 33* 32  GLUCOSE 103*   < > 99 102* 89  BUN 18   < > 19 25* 22  CREATININE 0.80   < > 0.84 0.87 0.76  CALCIUM 8.7*   < > 9.0 8.7* 8.4*  MG  --   --  1.9 1.9 2.2  PROT 6.2*  --   --   --   --   ALBUMIN 3.4*  --   --   --   --   AST 21  --   --   --   --   ALT 17  --   --   --   --   ALKPHOS 41  --   --   --   --   BILITOT 1.0  --   --   --   --   GFRNONAA >60   < > >  60 >60 >60  ANIONGAP 5   < > 9 8 7    < > = values in this interval not displayed.    Lipids  Recent Labs  Lab 01/19/21 1238  CHOL 101  TRIG 49  HDL 40*  LDLCALC 51  CHOLHDL 2.5    Hematology Recent Labs  Lab 01/18/21 1654 01/19/21 0426  WBC 8.4 8.9  RBC 4.54 4.26  HGB 13.4 12.9*  HCT 42.1 39.4  MCV 92.7 92.5  MCH 29.5 30.3  MCHC 31.8 32.7  RDW 15.1 15.1  PLT 151 129*   Thyroid  Recent Labs  Lab 01/19/21 1238 01/20/21 1221  TSH 9.059*  --   FREET4  --  1.17*    BNP Recent Labs  Lab 01/18/21 1654 01/19/21 1237  BNP 951.0* 942.4*    DDimer No results for input(s): DDIMER in the last 168 hours.   Radiology    No results found.  Cardiac Studies   Echo 01/19/2021  1. Left ventricular ejection fraction, by estimation, is 25 to 30%. The  left ventricle has severely decreased function. Left ventricular  endocardial border not optimally defined to evaluate regional wall motion.  Left ventricular diastolic parameters are   indeterminate.   2. Right ventricular systolic function was not well visualized. The right  ventricular size is normal. Tricuspid regurgitation signal is inadequate  for assessing PA pressure.   3. The mitral valve was not well visualized. No evidence of mitral valve  regurgitation. No evidence of mitral stenosis.    4. The aortic valve was not well visualized. Aortic valve regurgitation  is not visualized. No aortic stenosis is present.   5. Technically difficult study due to poor echo windows.      Very limited study even with contrast agent. Consider alternative  testing.    Echo 04/18/2020  1. Left ventricular ejection fraction, by estimation, is 50 to 55%. The  left ventricle has low normal function. The left ventricle grossly has no  regional wall motion abnormalities. Left ventricular diastolic parameters  are consistent with Grade II  diastolic dysfunction (pseudonormalization).   2. Right ventricular systolic function is normal. The right ventricular  size is normal. Tricuspid regurgitation signal is inadequate for assessing  PA pressure.   3. Left atrial size was moderately dilated.   4. Challenging images    MPI 03/2020 Pharmacological myocardial perfusion imaging study with no significant ischemia Small region of apical thinning, likely secondary to attenuation artifact Global hypokinesis, EF estimated at 25% No EKG changes concerning for ischemia at peak stress or in recovery. PVCs noted postinfusion CT attenuation correction images with mild aortic atherosclerosis in the arch, coronary calcification Moderate risk scan, consider confirmation of ejection fraction with echocardiogram  Patient Profile     75 y.o. male newly reduced EF 25 to 30%, hypertension, obesity, hyperlipidemia, prior pneumonia in 2013, and who is being seen today for the evaluation of exacerbation of heart failure with echo showing EF reduced from previous 50 to 55% to 01/19/2021 EF 25 to 30% and recommendation for right left heart catheterization for further ischemic work-up and better understanding of hemodynamics.  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  - plan for [facility] once volume status is optimized, will discuss timing with MD  HTN - BP soft - continue coreg and Losartan  HLD - LDL 51 01/2021 - continue statin  NSVT - longest 6 beats on tele - continue BB - plan for heart cath as above  Hypothyroidism - continue synthroid - per IM  Obesity - recommend weight loss  For questions or updates, please contact [facility] Please consult [email or web address] for contact info under        Signed, Ameera Tigue Ninfa Meeker, PA-C  01/22/2021, 8:07 AM

## 2021-01-22 NOTE — TOC Initial Note (Addendum)
Transition of Care [facility]) - Initial/Assessment Note    Patient Details  Name: Anthony Weber MRN: [id number] Date of Birth: 31-Jul-1945  Transition of Care [facility]) CM/SW Contact:    Alberteen Sam, LCSW Phone Number: 01/22/2021, 10:38 AM  Clinical Narrative:                  CSW spoke with patient's spouse Hassan Rowan regarding PT recs of [place], she reports they decline at this time as patient plans to return to work upon discharge and would not be home/available for any home health PT.  CSW informed spouse Hassan Rowan of PT recs of rolling walker, reports they are agreeable to this.  CSW requested order from MD, and contacted Suanne Marker with [employer] to inform.   Bariatric rolling walker was delivered to patient room today.     Expected Discharge Plan: Home/Self Care Barriers to Discharge: Continued Medical Work up   Patient Goals and CMS Choice Patient states their goals for this hospitalization and ongoing recovery are:: to go home CMS Medicare.gov Compare Post Acute Care list provided to:: Patient Represenative (must comment) (wife) Choice offered to / list presented to : Spouse  Expected Discharge Plan and Services Expected Discharge Plan: Home/Self Care       Living arrangements for the past 2 months: Single Family Home                 DME Arranged: Walker rolling DME Agency: [employer] Date DME Agency Contacted: 01/22/21 Time DME Agency Contacted: 67 Representative spoke with at DME Agency: Suanne Marker            Prior Living Arrangements/Services Living arrangements for the past 2 months: [place] Lives with:: Spouse Patient language and need for interpreter reviewed:: Yes        Need for Family Participation in Patient Care: Yes (Comment) Care giver support system in place?: Yes (comment)   Criminal Activity/Legal Involvement Pertinent to Current Situation/Hospitalization: No - Comment as needed  Activities of Daily Living Home Assistive Devices/Equipment:  None ADL Screening (condition at time of admission) Patient's cognitive ability adequate to safely complete daily activities?: No Is the patient deaf or have difficulty hearing?: No Does the patient have difficulty seeing, even when wearing glasses/contacts?: No Does the patient have difficulty concentrating, remembering, or making decisions?: No Patient able to express need for assistance with ADLs?: Yes Does the patient have difficulty dressing or bathing?: No Independently performs ADLs?: Yes (appropriate for developmental age) Does the patient have difficulty walking or climbing stairs?: Yes Weakness of Legs: Both Weakness of Arms/Hands: None  Permission Sought/Granted                  Emotional Assessment       Orientation: : Oriented to Self, Oriented to Place, Oriented to Situation, Oriented to  Time Alcohol / Substance Use: Not Applicable Psych Involvement: No (comment)  Admission diagnosis:  Acute CHF (congestive heart failure) (HCC) [I50.9] Acute on chronic diastolic congestive heart failure (HCC) [I50.33] Bilateral lower extremity edema [R60.0] Patient Active Problem List   Diagnosis Date Noted   Primary hypertension    Acute HFrEF (heart failure with reduced ejection fraction) ([place])    Acute CHF (congestive heart failure) ([place]) 01/18/2021   PCP:  Albina Billet, MD Pharmacy:   [id number] - Closed - [place], [place] - [address] [place] [address] Phone: [phone] Fax: [phone]  [id number] - [place], [place] [phone] [place] [address] Phone: [phone] Fax: [phone]     Social Determinants of Health (SDOH) Interventions    Readmission Risk Interventions No flowsheet data found.

## 2021-01-22 NOTE — Progress Notes (Signed)
Mobility Specialist - Progress Note   01/22/21 1506  Mobility  Activity Refused mobility  Mobility performed by Mobility specialist    Pt politely declined, states he just finished ambulating 2 laps with staff prior to entry. Will attempt another date.    Kathee Delton Mobility Specialist 01/22/21, 3:07 PM

## 2021-01-22 NOTE — Progress Notes (Addendum)
PROGRESS NOTE    Anthony Weber  ERD:[id number] DOB: 10-28-1945 DOA: 01/18/2021 PCP: Albina Billet, MD    Brief Narrative:  75 y.o. Caucasian male with medical history significant for depression, hypertension, diastolic CHF and hypothyroidism, presented to the emergency room with acute onset of worsening lower extremity edema with associated abdominal distention, recent dyspnea at rest and on exertion.  He admitted to orthopnea and paroxysmal nocturnal dyspnea.  He gained 15 pounds over the last couple of weeks.  No fever or chills.  He denies any cough or wheezing.  No dysuria, oliguria or hematuria or flank pain.  No chest pain or palpitations.  Started on aggressive IV diuresis.  Diuresed effectively.  Symptoms began to improve. 10/12 feels sob improving. No cp 10/13 reports "never felt better'. Able to lay flat. No other complaints.   Assessment & Plan:   Active Problems:   Acute CHF (congestive heart failure) (HCC)   Acute HFrEF (heart failure with reduced ejection fraction) (HCC)   Primary hypertension  Acute combined systolic and diastolic congestive heart failure Last 2D echocardiogram in 2022, EF 50 to 56%, grade 2 diastolic dysfunction 31/49-[id number] following Continue IV diuresis Continue losartan and Coreg 10/13 clinically improving.  Able to lay flat.  But still volume overloaded Plan for right and left heart cath possibly tomorrow?  Anasarca Due to above Clinically improving Continue IV diuresis  Essential hypertension Normotensive to low Continue losartan and Coreg    Hypothyroidism TSH at 9 Continue PTA Synthroid Free T4 1.17 Free T3 normal 10/13 follow-up with outpatient PCP   Hyperlipidemia Continue statins  Morbid obesity BMI 50.27 This complicates overall care and prognosis  Hyponatremia- mild . Na 134.  Likely 2/2 diuresis.  Stabilized   DVT prophylaxis: SQ Lovenox Code Status: Full Family Communication: Wife at  bedside Disposition Plan: Status is: Inpatient  Remains inpatient appropriate because:Inpatient level of care appropriate due to severity of illness  Dispo: The patient is from: Home              Anticipated d/c is to: Home              Patient currently is not medically stable to d/c.   Difficult to place patient No       Level of care: Progressive Cardiac  Consultants:  Cardiology  Procedures:  None  Antimicrobials: None   Subjective: Reports decreased shortness of breath.  Feels much better.  No other complaint  Objective: Vitals:   01/21/21 2108 01/22/21 0018 01/22/21 0600 01/22/21 0809  BP: 104/63 103/65 102/60 103/66  Pulse: 76 69 76 76  Resp: 18 17 18 17   Temp: 97.7 F (36.5 C) 97.9 F (36.6 C) 97.6 F (36.4 C) 97.7 F (36.5 C)  TempSrc: Oral Oral Oral Oral  SpO2: 96% 96% 95% 97%  Weight:    (!) 149.5 kg  Height:        Intake/Output Summary (Last 24 hours) at 01/22/2021 0913 Last data filed at 01/22/2021 0800 Gross per 24 hour  Intake 720 ml  Output 3200 ml  Net -2480 ml   Filed Weights   01/20/21 0504 01/21/21 0510 01/22/21 0809  Weight: (!) 150.3 kg (!) 148.8 kg (!) 149.5 kg    Examination: NAD, calm CTA no wheeze Regular S1-S2 no gallops Soft benign positive bowel sounds 2+ edema b/l aaoxox3   Data Reviewed: I have personally reviewed following labs and imaging studies  CBC: Recent Labs  Lab 01/18/21 1654 01/19/21 0426  WBC  8.4 8.9  HGB 13.4 12.9*  HCT 42.1 39.4  MCV 92.7 92.5  PLT 151 270*   Basic Metabolic Panel: Recent Labs  Lab 01/18/21 1654 01/19/21 0426 01/20/21 0806 01/21/21 0555 01/22/21 0459  NA 136 137 137 134* 134*  K 4.9 4.0 4.1 4.1 3.9  CL 100 100 94* 93* 95*  CO2 31 31 34* 33* 32  GLUCOSE 103* 115* 99 102* 89  BUN 18 19 19  25* 22  CREATININE 0.80 0.77 0.84 0.87 0.76  CALCIUM 8.7* 8.9 9.0 8.7* 8.4*  MG  --   --  1.9 1.9 2.2   GFR: Estimated Creatinine Clearance: 115.1 mL/min (by C-G formula  based on SCr of 0.76 mg/dL). Liver Function Tests: Recent Labs  Lab 01/18/21 1654  AST 21  ALT 17  ALKPHOS 41  BILITOT 1.0  PROT 6.2*  ALBUMIN 3.4*   No results for input(s): LIPASE, AMYLASE in the last 168 hours. No results for input(s): AMMONIA in the last 168 hours. Coagulation Profile: No results for input(s): INR, PROTIME in the last 168 hours. Cardiac Enzymes: No results for input(s): CKTOTAL, CKMB, CKMBINDEX, TROPONINI in the last 168 hours. BNP (last 3 results) No results for input(s): PROBNP in the last 8760 hours. HbA1C: No results for input(s): HGBA1C in the last 72 hours. CBG: No results for input(s): GLUCAP in the last 168 hours. Lipid Profile: Recent Labs    01/19/21 1238  CHOL 101  HDL 40*  LDLCALC 51  TRIG 49  CHOLHDL 2.5   Thyroid Function Tests: Recent Labs    01/19/21 1238 01/20/21 1221  TSH 9.059*  --   FREET4  --  1.17*  T3FREE  --  2.2   Anemia Panel: No results for input(s): VITAMINB12, FOLATE, FERRITIN, TIBC, IRON, RETICCTPCT in the last 72 hours. Sepsis Labs: No results for input(s): PROCALCITON, LATICACIDVEN in the last 168 hours.  Recent Results (from the past 240 hour(s))  Resp Panel by RT-PCR (Flu A&B, Covid) Nasopharyngeal Swab     Status: None   Collection Time: 01/18/21  7:54 PM   Specimen: Nasopharyngeal Swab; Nasopharyngeal(NP) swabs in vial transport medium  Result Value Ref Range Status   SARS Coronavirus 2 by RT PCR NEGATIVE NEGATIVE Final    Comment: (NOTE) SARS-CoV-2 target nucleic acids are NOT DETECTED.  The SARS-CoV-2 RNA is generally detectable in upper respiratory specimens during the acute phase of infection. The lowest concentration of SARS-CoV-2 viral copies this assay can detect is 138 copies/mL. A negative result does not preclude SARS-Cov-2 infection and should not be used as the sole basis for treatment or other patient management decisions. A negative result may occur with  improper specimen  collection/handling, submission of specimen other than nasopharyngeal swab, presence of viral mutation(s) within the areas targeted by this assay, and inadequate number of viral copies(<138 copies/mL). A negative result must be combined with clinical observations, patient history, and epidemiological information. The expected result is Negative.  Fact Sheet for Patients:  EntrepreneurPulse.com.au  Fact Sheet for Healthcare Providers:  IncredibleEmployment.be  This test is no t yet approved or cleared by the [place] FDA and  has been authorized for detection and/or diagnosis of SARS-CoV-2 by FDA under an Emergency Use Authorization (EUA). This EUA will remain  in effect (meaning this test can be used) for the duration of the COVID-19 declaration under Section 564(b)(1) of the Act, 21 U.S.C.section 360bbb-3(b)(1), unless the authorization is terminated  or revoked sooner.       Influenza  A by PCR NEGATIVE NEGATIVE Final   Influenza B by PCR NEGATIVE NEGATIVE Final    Comment: (NOTE) The Xpert Xpress SARS-CoV-2/FLU/RSV plus assay is intended as an aid in the diagnosis of influenza from Nasopharyngeal swab specimens and should not be used as a sole basis for treatment. Nasal washings and aspirates are unacceptable for Xpert Xpress SARS-CoV-2/FLU/RSV testing.  Fact Sheet for Patients: EntrepreneurPulse.com.au  Fact Sheet for Healthcare Providers: IncredibleEmployment.be  This test is not yet approved or cleared by the [place] FDA and has been authorized for detection and/or diagnosis of SARS-CoV-2 by FDA under an Emergency Use Authorization (EUA). This EUA will remain in effect (meaning this test can be used) for the duration of the COVID-19 declaration under Section 564(b)(1) of the Act, 21 U.S.C. section 360bbb-3(b)(1), unless the authorization is terminated or revoked.  Performed at [facility], [address], [place], [place] [address]          Radiology Studies: No results found.      Scheduled Meds:  aspirin  81 mg Oral Daily   carvedilol  3.125 mg Oral BID WC   diphenhydrAMINE  25 mg Oral QHS   enoxaparin (LOVENOX) injection  0.5 mg/kg Subcutaneous Q24H   furosemide  40 mg Intravenous Q12H   levothyroxine  50 mcg Oral Q0600   losartan  50 mg Oral Daily   melatonin  5 mg Oral QHS   pravastatin  40 mg Oral Daily   Continuous Infusions:   LOS: 4 days    Time spent: 35 mins than 50% on COC    Nolberto Hanlon, MD [facility]   If 7PM-7AM, please contact night-coverage  01/22/2021, 9:13 AM

## 2021-01-22 NOTE — Progress Notes (Addendum)
Physical Therapy Treatment Patient Details Name: Anthony Weber MRN: [id number] DOB: 10-26-1945 Today's Date: 01/22/2021   History of Present Illness 75 y.o. Caucasian male with medical history significant for depression, hypertension, diastolic CHF and hypothyroidism, presented to the emergency room with acute onset of worsening lower extremity edema with associated abdominal distention, recent dyspnea at rest and on exertion.  He admitted to orthopnea and paroxysmal nocturnal dyspnea.  He gained 15 pounds over the last couple of weeks.  No fever or chills.  He denies any cough or wheezing    PT Comments    Pt in bed with spouse present throughout treatment. Pt required slight encouragement for participation but overall very agreeable and pleasant.  Goal of treatment was to encourage RW usage due to poor stability without AD usage. Cues for safety and utilizing RW through transitions provided with pt acknowledgment. Pt appears to be back at baseline function compared to previous session, and discharge recommendations are being changed to outpatient PT to address balance, stability, endurance, and overall functional mobility. Skilled PT intervention is indicated to address deficits in function, mobility, and to return to PLOF as able.      Recommendations for follow up therapy are one component of a multi-disciplinary discharge planning process, led by the attending physician.  Recommendations may be updated based on patient status, additional functional criteria and insurance authorization.  Follow Up Recommendations  Outpatient PT     Equipment Recommendations  Rolling walker with 5" wheels    Recommendations for Other Services       Precautions / Restrictions Precautions Precautions: Fall Restrictions Weight Bearing Restrictions: No     Mobility  Bed Mobility Overal bed mobility: Needs Assistance Bed Mobility: Supine to Sit;Sit to Supine     Supine to sit: HOB  elevated;Modified independent (Device/Increase time) Sit to supine: Supervision   General bed mobility comments: SUPV for boosting to EOB, no physical assist provided    Transfers Overall transfer level: Needs assistance Equipment used: Rolling walker (2 wheeled) Transfers: Sit to/from Stand Sit to Stand: Supervision         General transfer comment: STS x 2, no cues for hand placement but requires reminders to keep RW inside BOS when sitting as pt prefers to use tray table, bed, ect  Ambulation/Gait Ambulation/Gait assistance: Supervision Gait Distance (Feet): 100 Feet Assistive device: Rolling walker (2 wheeled) Gait Pattern/deviations: Decreased step length - right;Decreased step length - left;Decreased dorsiflexion - left;Decreased dorsiflexion - right Gait velocity: Moderate w/ AD   General Gait Details: Adjusted RW height to assis with hip extension, improvement in WB through LE.   Stairs             Wheelchair Mobility    Modified Rankin (Stroke Patients Only)       Balance Overall balance assessment: Needs assistance Sitting-balance support: Feet supported Sitting balance-Leahy Scale: Good Sitting balance - Comments: Able to reach across tray table without LOB   Standing balance support: Bilateral upper extremity supported;During functional activity Standing balance-Leahy Scale: Fair Standing balance comment: Requires assist for dynamic balance and static but is able to stand without support for short periods without LOB                            Cognition Arousal/Alertness: Awake/alert Behavior During Therapy: WFL for tasks assessed/performed Overall Cognitive Status: Within Functional Limits for tasks assessed  General Comments: very pleasant and cooperative      Exercises      General Comments General comments (skin integrity, edema, etc.): HR monitored throughout session 70-80, 71 at  rest w/ SpO2 95%      Pertinent Vitals/Pain Pain Assessment: No/denies pain    Home Living                      Prior Function            PT Goals (current goals can now be found in the care plan section) Progress towards PT goals: Progressing toward goals    Frequency    Min 2X/week      PT Plan Discharge plan needs to be updated    Co-evaluation              AM-PAC PT "6 Clicks" Mobility   Outcome Measure  Help needed turning from your back to your side while in a flat bed without using bedrails?: A Little Help needed moving from lying on your back to sitting on the side of a flat bed without using bedrails?: A Little Help needed moving to and from a bed to a chair (including a wheelchair)?: A Little Help needed standing up from a chair using your arms (e.g., wheelchair or bedside chair)?: None Help needed to walk in hospital room?: A Little Help needed climbing 3-5 steps with a railing? : A Lot 6 Click Score: 18    End of Session Equipment Utilized During Treatment: Gait belt Activity Tolerance: Patient tolerated treatment well Patient left: in bed;with call bell/phone within reach;with family/visitor present;with bed alarm set   PT Visit Diagnosis: Other abnormalities of gait and mobility (R26.89);Muscle weakness (generalized) (M62.81)     Time: 7035-0093 PT Time Calculation (min) (ACUTE ONLY): 25 min  Charges:                       [employer], SPT

## 2021-01-22 NOTE — Plan of Care (Signed)
Patient awake throughout the night. Given Benadryl and Melatonin for sleeping but stated did not help. No BM, VSS. Patient monitored closely.   Problem: Education: Goal: Ability to demonstrate management of disease process will improve Outcome: Progressing Goal: Ability to verbalize understanding of medication therapies will improve Outcome: Progressing Goal: Individualized Educational Video(s) Outcome: Progressing   Problem: Activity: Goal: Capacity to carry out activities will improve Outcome: Progressing   Problem: Cardiac: Goal: Ability to achieve and maintain adequate cardiopulmonary perfusion will improve Outcome: Progressing   Problem: Education: Goal: Knowledge of General Education information will improve Description: Including pain rating scale, medication(s)/side effects and non-pharmacologic comfort measures Outcome: Progressing   Problem: Health Behavior/Discharge Planning: Goal: Ability to manage health-related needs will improve Outcome: Progressing   Problem: Clinical Measurements: Goal: Ability to maintain clinical measurements within normal limits will improve Outcome: Progressing Goal: Will remain free from infection Outcome: Progressing Goal: Diagnostic test results will improve Outcome: Progressing Goal: Respiratory complications will improve Outcome: Progressing Goal: Cardiovascular complication will be avoided Outcome: Progressing   Problem: Activity: Goal: Risk for activity intolerance will decrease Outcome: Progressing   Problem: Nutrition: Goal: Adequate nutrition will be maintained Outcome: Progressing   Problem: Coping: Goal: Level of anxiety will decrease Outcome: Progressing   Problem: Elimination: Goal: Will not experience complications related to bowel motility Outcome: Progressing Goal: Will not experience complications related to urinary retention Outcome: Progressing   Problem: Pain Managment: Goal: General experience of  comfort will improve Outcome: Progressing   Problem: Safety: Goal: Ability to remain free from injury will improve Outcome: Progressing   Problem: Skin Integrity: Goal: Risk for impaired skin integrity will decrease Outcome: Progressing

## 2021-01-22 NOTE — Care Management Important Message (Signed)
Important Message  Patient Details  Name: Anthony Weber MRN: [id number] Date of Birth: 05-04-1945   Medicare Important Message Given:  Yes     Dannette Barbara 01/22/2021, 4:31 PM

## 2021-01-23 DIAGNOSIS — I429 Cardiomyopathy, unspecified: Secondary | ICD-10-CM

## 2021-01-23 DIAGNOSIS — I5043 Acute on chronic combined systolic (congestive) and diastolic (congestive) heart failure: Secondary | ICD-10-CM

## 2021-01-23 DIAGNOSIS — I5041 Acute combined systolic (congestive) and diastolic (congestive) heart failure: Secondary | ICD-10-CM | POA: Diagnosis not present

## 2021-01-23 DIAGNOSIS — E871 Hypo-osmolality and hyponatremia: Secondary | ICD-10-CM | POA: Diagnosis not present

## 2021-01-23 DIAGNOSIS — I420 Dilated cardiomyopathy: Secondary | ICD-10-CM

## 2021-01-23 DIAGNOSIS — I10 Essential (primary) hypertension: Secondary | ICD-10-CM | POA: Diagnosis not present

## 2021-01-23 LAB — BASIC METABOLIC PANEL
Anion gap: 8 (ref 5–15)
BUN: 26 mg/dL — ABNORMAL HIGH (ref 8–23)
CO2: 32 mmol/L (ref 22–32)
Calcium: 8.4 mg/dL — ABNORMAL LOW (ref 8.9–10.3)
Chloride: 95 mmol/L — ABNORMAL LOW (ref 98–111)
Creatinine, Ser: 0.75 mg/dL (ref 0.61–1.24)
GFR, Estimated: >60 mL/min (ref >60)
Glucose, Bld: 126 mg/dL — ABNORMAL HIGH (ref 70–99)
Potassium: 3.7 mmol/L (ref 3.5–5.1)
Sodium: 135 mmol/L (ref 135–145)

## 2021-01-23 LAB — MAGNESIUM: Magnesium: 2.4 mg/dL (ref 1.7–2.4)

## 2021-01-23 MED ORDER — FUROSEMIDE 10 MG/ML IJ SOLN
40.0000 mg | Freq: Three times a day (TID) | INTRAMUSCULAR | Status: DC
  Administered 2021-01-23 – 2021-01-26 (×9): 40 mg via INTRAVENOUS
  Filled 2021-01-23 (×9): qty 4

## 2021-01-23 MED ORDER — METOLAZONE 2.5 MG PO TABS
2.5000 mg | ORAL_TABLET | Freq: Every day | ORAL | Status: DC
  Administered 2021-01-23 – 2021-01-25 (×3): 2.5 mg via ORAL
  Filled 2021-01-23 (×4): qty 1

## 2021-01-23 MED ORDER — ENOXAPARIN SODIUM 80 MG/0.8ML IJ SOSY
0.5000 mg/kg | PREFILLED_SYRINGE | INTRAMUSCULAR | Status: DC
  Administered 2021-01-23 – 2021-01-28 (×6): 75 mg via SUBCUTANEOUS
  Filled 2021-01-23 (×6): qty 0.8

## 2021-01-23 NOTE — Progress Notes (Signed)
Occupational Therapy Treatment Patient Details Name: Anthony Weber MRN: [id number] DOB: 19-Apr-1945 Today's Date: 01/23/2021   History of present illness 75 y.o. Caucasian male with medical history significant for depression, hypertension, diastolic CHF and hypothyroidism, presented to the emergency room with acute onset of worsening lower extremity edema with associated abdominal distention, recent dyspnea at rest and on exertion.  He admitted to orthopnea and paroxysmal nocturnal dyspnea.  He gained 15 pounds over the last couple of weeks.  No fever or chills.  He denies any cough or wheezing   OT comments  Upon entering the room, pt supine in bed and family present in the room. Pt initially declines OT intervention and reports, " I just don't want to". OT encouraging pt to engage in [place] activities. Pt unwilling to perform any ADL tasks but is agreeable to ambulation. Total A to don B shoes. Pt needing min A to stand from EOB and ambulates 150' with RW and supervision. Bed mobility with supervision and cuing for hand placement. Pt remained seated on EOB with wife in room. All needs within reach.    Recommendations for follow up therapy are one component of a multi-disciplinary discharge planning process, led by the attending physician.  Recommendations may be updated based on patient status, additional functional criteria and insurance authorization.    Follow Up Recommendations  No OT follow up;Supervision - Intermittent    Equipment Recommendations  Other (comment) (RW)       Precautions / Restrictions Precautions Precautions: Fall Restrictions Weight Bearing Restrictions: No       Mobility Bed Mobility Overal bed mobility: Needs Assistance Bed Mobility: Supine to Sit;Sit to Supine     Supine to sit: HOB elevated;Supervision Sit to supine: Supervision   General bed mobility comments: cuing for technique    Transfers Overall transfer level: Needs assistance Equipment  used: Rolling walker (2 wheeled) Transfers: Sit to/from Stand Sit to Stand: Supervision;Min assist         General transfer comment: supervision with RW and cuing for hand placement. Pt perfers to pull up on counter in front of him which he does at home but needing min A.    Balance Overall balance assessment: Needs assistance Sitting-balance support: Feet supported Sitting balance-Leahy Scale: Good Sitting balance - Comments: Able to reach across tray table without LOB   Standing balance support: Bilateral upper extremity supported;During functional activity Standing balance-Leahy Scale: Fair Standing balance comment: Requires assist for dynamic balance and static but is able to stand without support for short periods without LOB                           ADL either performed or assessed with clinical judgement   ADL                         Lower Body Dressing Details (indicate cue type and reason): total A to don B shoes             Functional mobility during ADLs: Supervision/safety General ADL Comments: supervision for mobility with RW. Min A for sit <>stand.     Vision Patient Visual Report: No change from baseline            Cognition Arousal/Alertness: Awake/alert Behavior During Therapy: WFL for tasks assessed/performed Overall Cognitive Status: Within Functional Limits for tasks assessed  General Comments: Pt needing encouragement to participate                   Pertinent Vitals/ Pain       Pain Assessment: No/denies pain  Home Living Family/patient expects to be discharged to:: Private residence Living Arrangements: Spouse/significant other Available Help at Discharge: Family;Available 24 hours/day Type of Home: House Home Access: Stairs to enter [employer] of Steps: 2 Entrance Stairs-Rails: Left Home Layout: One level                               Frequency  Min 2X/week        Progress Toward Goals  OT Goals(current goals can now be found in the care plan section)  Progress towards OT goals: Progressing toward goals  Acute Rehab OT Goals Patient Stated Goal: to return home OT Goal Formulation: With patient/family Time For Goal Achievement: 02/03/21 Potential to Achieve Goals: Good  Plan Discharge plan remains appropriate       AM-PAC OT "6 Clicks" Daily Activity     Outcome Measure   Help from another person eating meals?: None Help from another person taking care of personal grooming?: A Little Help from another person toileting, which includes using toliet, bedpan, or urinal?: A Little Help from another person bathing (including washing, rinsing, drying)?: A Little Help from another person to put on and taking off regular upper body clothing?: None Help from another person to put on and taking off regular lower body clothing?: A Little 6 Click Score: 20    End of Session Equipment Utilized During Treatment: Rolling walker  OT Visit Diagnosis: Unsteadiness on feet (R26.81);Muscle weakness (generalized) (M62.81)   Activity Tolerance Patient tolerated treatment well   Patient Left in bed;with call bell/phone within reach;with bed alarm set;with family/visitor present   Nurse Communication Mobility status        Time: 1442-1500 OT Time Calculation (min): 18 min  Charges: OT General Charges $OT Visit: 1 Visit OT Treatments $Therapeutic Activity: 8-22 mins  Darleen Crocker, MS, OTR/L , CBIS ascom [phone]  01/23/21, 3:52 PM

## 2021-01-23 NOTE — Plan of Care (Signed)
Patient awake throughout the night watching TV, stated the sleeping medications did not help, stated want to go home so he can get enough sleep. VSS, no BM, had one episode of 22 Beats VT asymptomatic. NP made aware.  Problem: Education: Goal: Ability to demonstrate management of disease process will improve Outcome: Progressing Goal: Ability to verbalize understanding of medication therapies will improve Outcome: Progressing Goal: Individualized Educational Video(s) Outcome: Progressing   Problem: Activity: Goal: Capacity to carry out activities will improve Outcome: Progressing   Problem: Cardiac: Goal: Ability to achieve and maintain adequate cardiopulmonary perfusion will improve Outcome: Progressing   Problem: Education: Goal: Knowledge of General Education information will improve Description: Including pain rating scale, medication(s)/side effects and non-pharmacologic comfort measures Outcome: Progressing   Problem: Health Behavior/Discharge Planning: Goal: Ability to manage health-related needs will improve Outcome: Progressing   Problem: Clinical Measurements: Goal: Ability to maintain clinical measurements within normal limits will improve Outcome: Progressing Goal: Will remain free from infection Outcome: Progressing Goal: Diagnostic test results will improve Outcome: Progressing Goal: Respiratory complications will improve Outcome: Progressing Goal: Cardiovascular complication will be avoided Outcome: Progressing   Problem: Activity: Goal: Risk for activity intolerance will decrease Outcome: Progressing   Problem: Nutrition: Goal: Adequate nutrition will be maintained Outcome: Progressing   Problem: Coping: Goal: Level of anxiety will decrease Outcome: Progressing   Problem: Elimination: Goal: Will not experience complications related to bowel motility Outcome: Progressing Goal: Will not experience complications related to urinary retention Outcome:  Progressing   Problem: Pain Managment: Goal: General experience of comfort will improve Outcome: Progressing   Problem: Safety: Goal: Ability to remain free from injury will improve Outcome: Progressing   Problem: Skin Integrity: Goal: Risk for impaired skin integrity will decrease Outcome: Progressing

## 2021-01-23 NOTE — Progress Notes (Addendum)
Attending Note Patient seen and examined, agree with detailed note above,   Patient presentation and plan discussed on rounds.    EKG lab work, chest x-ray, echocardiogram reviewed independently by myself  Remained supine in bed, continued shortness of breath, abdominal distention, leg swelling Little bit more than 1 L negative, now 9 L negative this admission Wife not at the bedside this morning Has a recliner by the bedside, has not tried to get out to sit in it  On examination : alert oriented, unable to estimate JVD, lungs scattered Rales, heart sounds regular normal S1-S2 no murmurs appreciated, abdomen distended, soft nontender 2+ pitting lower extremity edema.  Musculoskeletal exam with good range of motion, neurologic exam grossly nonfocal  Lab work reviewed sodium 135 potassium 3.7 creatinine 0.75 BUN 26 glucose 126  A/P: Acute on chronic diastolic and systolic CHF Ejection fraction estimated 25 to 30% on echo January 19, 2021 EF down from January 2022, estimated at that time 50 to 55% Drop in ejection fraction noted, etiology unclear, unable to exclude underlying ischemia Catheterization delayed by massive fluid overload, acute CHF -So far responding relatively well to Lasix 40 IV twice daily -Recommend we increase this up to every 8, add metolazone 2.5 once a day Will likely need diuresis through the weekend, consider right and left heart catheterization once euvolemic.  Unclear if he will be ready by Monday, suspect will be late next week given massive fluid overload   Morbid obesity Long history of debility, likely contributing to symptoms above Calorie restriction recommended Activity/exercise Limited secondary to debility   Cardiomyopathy Etiology unclear, acute drop in ejection fraction presenting with systolic CHF Continue Coreg 3.125 twice daily, Lasix, losartan 50 daily Little room on blood pressure to add additional agents such as spironolactone or transition  to Entresto -Could add Jardiance/Farxiga through the weekend   Greater than 50% was spent in counseling and coordination of care with patient Total encounter time 25 minutes or more   Signed: Esmond Plants  M.D., Ph.D. [facility]      Progress Note  Patient Name: Anthony Weber Date of Encounter: 01/23/2021  [place] Cardiologist: None   Subjective   UOP -1.5L. kdiney function stable. Still volume overloaded on exam. He denies chest pain or worsening shortness of breath.   Inpatient Medications    Scheduled Meds:  aspirin  81 mg Oral Daily   carvedilol  3.125 mg Oral BID WC   diphenhydrAMINE  25 mg Oral QHS   enoxaparin (LOVENOX) injection  0.5 mg/kg Subcutaneous Q24H   furosemide  40 mg Intravenous Q12H   levothyroxine  50 mcg Oral Q0600   losartan  50 mg Oral Daily   melatonin  5 mg Oral QHS   pravastatin  40 mg Oral Daily   Continuous Infusions:  PRN Meds: acetaminophen **OR** acetaminophen, magnesium hydroxide, ondansetron **OR** ondansetron (ZOFRAN) IV   Vital Signs    Vitals:   01/22/21 2359 01/23/21 0457 01/23/21 0500 01/23/21 0753  BP: 112/76 (!) 116/59  102/65  Pulse: 67 71  83  Resp: 18 18  18   Temp: 97.6 F (36.4 C) 97.7 F (36.5 C)  97.8 F (36.6 C)  TempSrc:    Oral  SpO2: 93% 95%  99%  Weight:   (!) 148.6 kg   Height:        Intake/Output Summary (Last 24 hours) at 01/23/2021 1058 Last data filed at 01/23/2021 0950 Gross per 24 hour  Intake 1120 ml  Output  1300 ml  Net -180 ml   Last 3 Weights 01/23/2021 01/22/2021 01/21/2021  Weight (lbs) 327 lb 9.7 oz 329 lb 8 oz 328 lb 1.6 oz  Weight (kg) 148.6 kg 149.46 kg 148.825 kg      Telemetry    NSR PVCs, 4beats NSVT, HR 60-70s - Personally Reviewed  ECG    No new - Personally Reviewed  Physical Exam   GEN: No acute distress.   Neck: No JVD Cardiac: RRR, no murmurs, rubs, or gallops.  Respiratory: Clear to auscultation bilaterally. GI: Soft, nontender,  non-distended  MS: 2+ Lower extremity edema; No deformity. Neuro:  Nonfocal  Psych: Normal affect   Labs    High Sensitivity Troponin:   Recent Labs  Lab 01/18/21 1654 01/19/21 1238  TROPONINIHS 20* 22*     Chemistry Recent Labs  Lab 01/18/21 1654 01/19/21 0426 01/21/21 0555 01/22/21 0459 01/23/21 0518  NA 136   < > 134* 134* 135  K 4.9   < > 4.1 3.9 3.7  CL 100   < > 93* 95* 95*  CO2 31   < > 33* 32 32  GLUCOSE 103*   < > 102* 89 126*  BUN 18   < > 25* 22 26*  CREATININE 0.80   < > 0.87 0.76 0.75  CALCIUM 8.7*   < > 8.7* 8.4* 8.4*  MG  --    < > 1.9 2.2 2.4  PROT 6.2*  --   --   --   --   ALBUMIN 3.4*  --   --   --   --   AST 21  --   --   --   --   ALT 17  --   --   --   --   ALKPHOS 41  --   --   --   --   BILITOT 1.0  --   --   --   --   GFRNONAA >60   < > >60 >60 >60  ANIONGAP 5   < > 8 7 8    < > = values in this interval not displayed.    Lipids  Recent Labs  Lab 01/19/21 1238  CHOL 101  TRIG 49  HDL 40*  LDLCALC 51  CHOLHDL 2.5    Hematology Recent Labs  Lab 01/18/21 1654 01/19/21 0426  WBC 8.4 8.9  RBC 4.54 4.26  HGB 13.4 12.9*  HCT 42.1 39.4  MCV 92.7 92.5  MCH 29.5 30.3  MCHC 31.8 32.7  RDW 15.1 15.1  PLT 151 129*   Thyroid  Recent Labs  Lab 01/19/21 1238 01/20/21 1221  TSH 9.059*  --   FREET4  --  1.17*    BNP Recent Labs  Lab 01/18/21 1654 01/19/21 1237  BNP 951.0* 942.4*    DDimer No results for input(s): DDIMER in the last 168 hours.   Radiology    No results found.  Cardiac Studies   Echo 01/19/2021  1. Left ventricular ejection fraction, by estimation, is 25 to 30%. The  left ventricle has severely decreased function. Left ventricular  endocardial border not optimally defined to evaluate regional wall motion.  Left ventricular diastolic parameters are   indeterminate.   2. Right ventricular systolic function was not well visualized. The right  ventricular size is normal. Tricuspid regurgitation signal is  inadequate  for assessing PA pressure.   3. The mitral valve was not well visualized. No evidence of mitral valve  regurgitation.  No evidence of mitral stenosis.   4. The aortic valve was not well visualized. Aortic valve regurgitation  is not visualized. No aortic stenosis is present.   5. Technically difficult study due to poor echo windows.      Very limited study even with contrast agent. Consider alternative  testing.    Echo 04/18/2020  1. Left ventricular ejection fraction, by estimation, is 50 to 55%. The  left ventricle has low normal function. The left ventricle grossly has no  regional wall motion abnormalities. Left ventricular diastolic parameters  are consistent with Grade II  diastolic dysfunction (pseudonormalization).   2. Right ventricular systolic function is normal. The right ventricular  size is normal. Tricuspid regurgitation signal is inadequate for assessing  PA pressure.   3. Left atrial size was moderately dilated.   4. Challenging images    MPI 03/2020 Pharmacological myocardial perfusion imaging study with no significant ischemia Small region of apical thinning, likely secondary to attenuation artifact Global hypokinesis, EF estimated at 25% No EKG changes concerning for ischemia at peak stress or in recovery. PVCs noted postinfusion CT attenuation correction images with mild aortic atherosclerosis in the arch, coronary calcification Moderate risk scan, consider confirmation of ejection fraction with echocardiogram    Patient Profile     75 y.o. male with pmh of  newly reduced EF 25 to 30%, hypertension, obesity, hyperlipidemia, prior pneumonia in 2013, and who is being seen today for the evaluation of exacerbation of heart failure with echo showing EF reduced from previous 50 to 55% to 01/19/2021 EF 25 to 30% and recommendation for right left heart catheterization for further ischemic work-up and better understanding of hemodynamics.  Assessment & Plan     Acute on chronic HFrEF with newly reduced EF - Echo this admission showed newly reduced EF 25-30% - Volume status improving with IV diuresis - UOP -1.5L, net -9.1L - lisinopril stopped 10/10>>started on Losartan 50mg  daily, BP soft limiting GDMT - continue coreg 3.125mg  BID. GDMT as tolerated - kidney function wnl - He is still volume overloaded on exam, although he says is is able to lay flat.  - increase lasix to 40mg  Q6H. Will add metolazone 2.5mg  daily - plan for [facility] once volume status is optimized, will discuss timing with MD   HTN - BP soft - continue coreg and Losartan   HLD - LDL 51 01/2021 - continue statin   NSVT - longest 4 beats on tele - continue BB - plan for heart cath as above   Hypothyroidism - continue synthroid - per IM   Obesity - recommend weight loss  For questions or updates, please contact [facility] Please consult [email or web address] for contact info under        Signed, Cadence Ninfa Meeker, PA-C  01/23/2021, 10:58 AM

## 2021-01-23 NOTE — Plan of Care (Signed)

## 2021-01-23 NOTE — Progress Notes (Signed)
PROGRESS NOTE    Anthony Weber  BSJ:[id number] DOB: [birth year] DOA: 01/18/2021 PCP: Albina Billet, MD    Brief Narrative:  75 y.o. Caucasian male with medical history significant for depression, hypertension, diastolic CHF and hypothyroidism, presented to the emergency room with acute onset of worsening lower extremity edema with associated abdominal distention, recent dyspnea at rest and on exertion.  He admitted to orthopnea and paroxysmal nocturnal dyspnea.  He gained 15 pounds over the last couple of weeks.  No fever or chills.  He denies any cough or wheezing.  No dysuria, oliguria or hematuria or flank pain.  No chest pain or palpitations.  Started on aggressive IV diuresis.  Diuresed effectively.  Symptoms began to improve. 10/12 feels sob improving. No cp 10/13 reports "never felt better'. Able to lay flat. No other complaints.   10/14 patient denies any worsening shortness of breath, feels better.  Sleepy this AM.  No chest pain  Assessment & Plan:   Active Problems:   Acute CHF (congestive heart failure) (HCC)   Acute HFrEF (heart failure with reduced ejection fraction) (HCC)   Primary hypertension  Acute combined systolic and diastolic congestive heart failure Echo with EF 25 to 30%  Cardiology following  continue IV diuresis Continue losartan and Coreg 10/14 clinically improving.  Reports being able to lay flat.  Still mildly volume overloaded.   Lasix increased to 40 mg IV 3 times daily  [place] zone 2.5 once a day added  plan for left and right heart cath next week depending on his volume overload improvement   Anasarca Due to above Clinically and slowly improving Continue as above  Essential hypertension Normotensive to low  Continue losartan and Coreg      Hypothyroidism TSH at 9 Continue PTA Synthroid Free T4 1.17 Free T3 normal 10/14 follow-up with outpatient PCP     Hyperlipidemia Continue statins  Morbid obesity BMI 76.54 This  complicates overall care and prognosis  Hyponatremia- mild . Due to hypervolemia Improving with diuresis  DVT prophylaxis: SQ Lovenox Code Status: Full Family Communication: None at bedside Disposition Plan: Status is: Inpatient  Remains inpatient appropriate because:Inpatient level of care appropriate due to severity of illness  Dispo: The patient is from: Home              Anticipated d/c is to: Home              Patient currently is not medically stable to d/c.   Difficult to place patient No       Level of care: Progressive Cardiac  Consultants:  Cardiology  Procedures:  None  Antimicrobials: None   Subjective: Sleepy this AM.  Decrease shortness of breath.  No chest pain  Objective: Vitals:   01/22/21 2359 01/23/21 0457 01/23/21 0500 01/23/21 0753  BP: 112/76 (!) 116/59  102/65  Pulse: 67 71  83  Resp: 18 18  18   Temp: 97.6 F (36.4 C) 97.7 F (36.5 C)  97.8 F (36.6 C)  TempSrc:    Oral  SpO2: 93% 95%  99%  Weight:   (!) 148.6 kg   Height:        Intake/Output Summary (Last 24 hours) at 01/23/2021 0852 Last data filed at 01/23/2021 0500 Gross per 24 hour  Intake 960 ml  Output 650 ml  Net 310 ml   Filed Weights   01/21/21 0510 01/22/21 0809 01/23/21 0500  Weight: (!) 148.8 kg (!) 149.5 kg (!) 148.6 kg  Examination: NAD, calm CTA no wheeze rales rhonchi's, decreased breath sounds at bases regular S1-S2 no gallops soft benign positive bowel sounds 2+ pitting edema b/l Grossly intact    Data Reviewed: I have personally reviewed following labs and imaging studies  CBC: Recent Labs  Lab 01/18/21 1654 01/19/21 0426  WBC 8.4 8.9  HGB 13.4 12.9*  HCT 42.1 39.4  MCV 92.7 92.5  PLT 151 876*   Basic Metabolic Panel: Recent Labs  Lab 01/19/21 0426 01/20/21 0806 01/21/21 0555 01/22/21 0459 01/23/21 0518  NA 137 137 134* 134* 135  K 4.0 4.1 4.1 3.9 3.7  CL 100 94* 93* 95* 95*  CO2 31 34* 33* 32 32  GLUCOSE 115* 99 102* 89  126*  BUN 19 19 25* 22 26*  CREATININE 0.77 0.84 0.87 0.76 0.75  CALCIUM 8.9 9.0 8.7* 8.4* 8.4*  MG  --  1.9 1.9 2.2 2.4   GFR: Estimated Creatinine Clearance: 114.7 mL/min (by C-G formula based on SCr of 0.75 mg/dL). Liver Function Tests: Recent Labs  Lab 01/18/21 1654  AST 21  ALT 17  ALKPHOS 41  BILITOT 1.0  PROT 6.2*  ALBUMIN 3.4*   No results for input(s): LIPASE, AMYLASE in the last 168 hours. No results for input(s): AMMONIA in the last 168 hours. Coagulation Profile: No results for input(s): INR, PROTIME in the last 168 hours. Cardiac Enzymes: No results for input(s): CKTOTAL, CKMB, CKMBINDEX, TROPONINI in the last 168 hours. BNP (last 3 results) No results for input(s): PROBNP in the last 8760 hours. HbA1C: No results for input(s): HGBA1C in the last 72 hours. CBG: No results for input(s): GLUCAP in the last 168 hours. Lipid Profile: No results for input(s): CHOL, HDL, LDLCALC, TRIG, CHOLHDL, LDLDIRECT in the last 72 hours.  Thyroid Function Tests: Recent Labs    01/20/21 1221  FREET4 1.17*  T3FREE 2.2   Anemia Panel: No results for input(s): VITAMINB12, FOLATE, FERRITIN, TIBC, IRON, RETICCTPCT in the last 72 hours. Sepsis Labs: No results for input(s): PROCALCITON, LATICACIDVEN in the last 168 hours.  Recent Results (from the past 240 hour(s))  Resp Panel by RT-PCR (Flu A&B, Covid) Nasopharyngeal Swab     Status: None   Collection Time: 01/18/21  7:54 PM   Specimen: Nasopharyngeal Swab; Nasopharyngeal(NP) swabs in vial transport medium  Result Value Ref Range Status   SARS Coronavirus 2 by RT PCR NEGATIVE NEGATIVE Final    Comment: (NOTE) SARS-CoV-2 target nucleic acids are NOT DETECTED.  The SARS-CoV-2 RNA is generally detectable in upper respiratory specimens during the acute phase of infection. The lowest concentration of SARS-CoV-2 viral copies this assay can detect is 138 copies/mL. A negative result does not preclude SARS-Cov-2 infection and  should not be used as the sole basis for treatment or other patient management decisions. A negative result may occur with  improper specimen collection/handling, submission of specimen other than nasopharyngeal swab, presence of viral mutation(s) within the areas targeted by this assay, and inadequate number of viral copies(<138 copies/mL). A negative result must be combined with clinical observations, patient history, and epidemiological information. The expected result is Negative.  Fact Sheet for Patients:  EntrepreneurPulse.com.au  Fact Sheet for Healthcare Providers:  IncredibleEmployment.be  This test is no t yet approved or cleared by the [place] FDA and  has been authorized for detection and/or diagnosis of SARS-CoV-2 by FDA under an Emergency Use Authorization (EUA). This EUA will remain  in effect (meaning this test can be used) for the  duration of the COVID-19 declaration under Section 564(b)(1) of the Act, 21 U.S.C.section 360bbb-3(b)(1), unless the authorization is terminated  or revoked sooner.       Influenza A by PCR NEGATIVE NEGATIVE Final   Influenza B by PCR NEGATIVE NEGATIVE Final    Comment: (NOTE) The Xpert Xpress SARS-CoV-2/FLU/RSV plus assay is intended as an aid in the diagnosis of influenza from Nasopharyngeal swab specimens and should not be used as a sole basis for treatment. Nasal washings and aspirates are unacceptable for Xpert Xpress SARS-CoV-2/FLU/RSV testing.  Fact Sheet for Patients: EntrepreneurPulse.com.au  Fact Sheet for Healthcare Providers: IncredibleEmployment.be  This test is not yet approved or cleared by the [place] FDA and has been authorized for detection and/or diagnosis of SARS-CoV-2 by FDA under an Emergency Use Authorization (EUA). This EUA will remain in effect (meaning this test can be used) for the duration of the COVID-19 declaration  under Section 564(b)(1) of the Act, 21 U.S.C. section 360bbb-3(b)(1), unless the authorization is terminated or revoked.  Performed at [facility], [address], [place],  [address]          Radiology Studies: No results found.      Scheduled Meds:  aspirin  81 mg Oral Daily   carvedilol  3.125 mg Oral BID WC   diphenhydrAMINE  25 mg Oral QHS   enoxaparin (LOVENOX) injection  0.5 mg/kg Subcutaneous Q24H   furosemide  40 mg Intravenous Q12H   levothyroxine  50 mcg Oral Q0600   losartan  50 mg Oral Daily   melatonin  5 mg Oral QHS   pravastatin  40 mg Oral Daily   Continuous Infusions:   LOS: 5 days    Time spent: 35 mins than 50% on COC    Nolberto Hanlon, MD [facility]   If 7PM-7AM, please contact night-coverage  01/23/2021, 8:52 AM  P

## 2021-01-24 DIAGNOSIS — E039 Hypothyroidism, unspecified: Secondary | ICD-10-CM | POA: Diagnosis not present

## 2021-01-24 DIAGNOSIS — I5021 Acute systolic (congestive) heart failure: Secondary | ICD-10-CM

## 2021-01-24 DIAGNOSIS — E785 Hyperlipidemia, unspecified: Secondary | ICD-10-CM | POA: Diagnosis not present

## 2021-01-24 DIAGNOSIS — I10 Essential (primary) hypertension: Secondary | ICD-10-CM | POA: Diagnosis not present

## 2021-01-24 LAB — BASIC METABOLIC PANEL
Anion gap: 10 (ref 5–15)
BUN: 29 mg/dL — ABNORMAL HIGH (ref 8–23)
CO2: 34 mmol/L — ABNORMAL HIGH (ref 22–32)
Calcium: 8.9 mg/dL (ref 8.9–10.3)
Chloride: 89 mmol/L — ABNORMAL LOW (ref 98–111)
Creatinine, Ser: 0.82 mg/dL (ref 0.61–1.24)
GFR, Estimated: >60 mL/min (ref >60)
Glucose, Bld: 92 mg/dL (ref 70–99)
Potassium: 4.1 mmol/L (ref 3.5–5.1)
Sodium: 133 mmol/L — ABNORMAL LOW (ref 135–145)

## 2021-01-24 LAB — MAGNESIUM: Magnesium: 2.1 mg/dL (ref 1.7–2.4)

## 2021-01-24 NOTE — Progress Notes (Signed)
Mobility Specialist - Progress Note   01/24/21 1500  Mobility  Activity Refused mobility  Mobility performed by Mobility specialist    Pt declined mobility, no reason specified. Pt reports he's sleepy and would like to rest at this time. Family at bedside. Will attempt another date.    Kathee Delton Mobility Specialist 01/24/21, 3:17 PM

## 2021-01-24 NOTE — Progress Notes (Addendum)
Progress Note  Patient Name: Anthony Weber Date of Encounter: 01/24/2021  [facility] Cardiologist: None   Subjective   UOP -1.9L, possible more per patient reports. He reports improving breathing. No chest pain. Still volume up on exam.   Inpatient Medications    Scheduled Meds:  aspirin  81 mg Oral Daily   carvedilol  3.125 mg Oral BID WC   diphenhydrAMINE  25 mg Oral QHS   enoxaparin (LOVENOX) injection  0.5 mg/kg Subcutaneous Q24H   furosemide  40 mg Intravenous Q8H   levothyroxine  50 mcg Oral Q0600   losartan  50 mg Oral Daily   melatonin  5 mg Oral QHS   metolazone  2.5 mg Oral Daily   pravastatin  40 mg Oral Daily   Continuous Infusions:  PRN Meds: acetaminophen **OR** acetaminophen, magnesium hydroxide, ondansetron **OR** ondansetron (ZOFRAN) IV   Vital Signs    Vitals:   01/24/21 0020 01/24/21 0459 01/24/21 0500 01/24/21 0831  BP: 111/90 (!) 92/56  99/62  Pulse: 83 76  79  Resp: 18 18  19   Temp: 97.8 F (36.6 C) 97.8 F (36.6 C)  (!) 97.5 F (36.4 C)  TempSrc:    Oral  SpO2: 93% 95%  95%  Weight:   (!) 145.4 kg   Height:        Intake/Output Summary (Last 24 hours) at 01/24/2021 1049 Last data filed at 01/24/2021 1028 Gross per 24 hour  Intake 720 ml  Output 1900 ml  Net -1180 ml   Last 3 Weights 01/24/2021 01/23/2021 01/22/2021  Weight (lbs) 320 lb 8 oz 327 lb 9.7 oz 329 lb 8 oz  Weight (kg) 145.378 kg 148.6 kg 149.46 kg      Telemetry    NSR, frequent PVCS, HR 70-80s - Personally Reviewed  ECG    No new - Personally Reviewed  Physical Exam   GEN: No acute distress.   Neck: No JVD Cardiac: RRR, no murmurs, rubs, or gallops.  Respiratory: Clear to auscultation bilaterally. GI: Soft, nontender, non-distended  MS: 1-2+ lower leg edema; No deformity. Neuro:  Nonfocal  Psych: Normal affect   Labs    High Sensitivity Troponin:   Recent Labs  Lab 01/18/21 1654 01/19/21 1238  TROPONINIHS 20* 22*      Chemistry Recent Labs  Lab 01/18/21 1654 01/19/21 0426 01/22/21 0459 01/23/21 0518 01/24/21 0526  NA 136   < > 134* 135 133*  K 4.9   < > 3.9 3.7 4.1  CL 100   < > 95* 95* 89*  CO2 31   < > 32 32 34*  GLUCOSE 103*   < > 89 126* 92  BUN 18   < > 22 26* 29*  CREATININE 0.80   < > 0.76 0.75 0.82  CALCIUM 8.7*   < > 8.4* 8.4* 8.9  MG  --    < > 2.2 2.4 2.1  PROT 6.2*  --   --   --   --   ALBUMIN 3.4*  --   --   --   --   AST 21  --   --   --   --   ALT 17  --   --   --   --   ALKPHOS 41  --   --   --   --   BILITOT 1.0  --   --   --   --   GFRNONAA >60   < > >60 >  60 >60  ANIONGAP 5   < > 7 8 10    < > = values in this interval not displayed.    Lipids  Recent Labs  Lab 01/19/21 1238  CHOL 101  TRIG 49  HDL 40*  LDLCALC 51  CHOLHDL 2.5    Hematology Recent Labs  Lab 01/18/21 1654 01/19/21 0426  WBC 8.4 8.9  RBC 4.54 4.26  HGB 13.4 12.9*  HCT 42.1 39.4  MCV 92.7 92.5  MCH 29.5 30.3  MCHC 31.8 32.7  RDW 15.1 15.1  PLT 151 129*   Thyroid  Recent Labs  Lab 01/19/21 1238 01/20/21 1221  TSH 9.059*  --   FREET4  --  1.17*    BNP Recent Labs  Lab 01/18/21 1654 01/19/21 1237  BNP 951.0* 942.4*    DDimer No results for input(s): DDIMER in the last 168 hours.   Radiology    No results found.  Cardiac Studies   Echo 01/19/2021  1. Left ventricular ejection fraction, by estimation, is 25 to 30%. The  left ventricle has severely decreased function. Left ventricular  endocardial border not optimally defined to evaluate regional wall motion.  Left ventricular diastolic parameters are   indeterminate.   2. Right ventricular systolic function was not well visualized. The right  ventricular size is normal. Tricuspid regurgitation signal is inadequate  for assessing PA pressure.   3. The mitral valve was not well visualized. No evidence of mitral valve  regurgitation. No evidence of mitral stenosis.   4. The aortic valve was not well visualized. Aortic  valve regurgitation  is not visualized. No aortic stenosis is present.   5. Technically difficult study due to poor echo windows.      Very limited study even with contrast agent. Consider alternative  testing.    Echo 04/18/2020  1. Left ventricular ejection fraction, by estimation, is 50 to 55%. The  left ventricle has low normal function. The left ventricle grossly has no  regional wall motion abnormalities. Left ventricular diastolic parameters  are consistent with Grade II  diastolic dysfunction (pseudonormalization).   2. Right ventricular systolic function is normal. The right ventricular  size is normal. Tricuspid regurgitation signal is inadequate for assessing  PA pressure.   3. Left atrial size was moderately dilated.   4. Challenging images    MPI 03/2020 Pharmacological myocardial perfusion imaging study with no significant ischemia Small region of apical thinning, likely secondary to attenuation artifact Global hypokinesis, EF estimated at 25% No EKG changes concerning for ischemia at peak stress or in recovery. PVCs noted postinfusion CT attenuation correction images with mild aortic atherosclerosis in the arch, coronary calcification Moderate risk scan, consider confirmation of ejection fraction with echocardiogram  Patient Profile     75 y.o. male with pmh of  newly reduced EF 25 to 30%, hypertension, obesity, hyperlipidemia, prior pneumonia in 2013, and who is being seen today for the evaluation of exacerbation of heart failure with echo showing EF reduced from previous 50 to 55% to 01/19/2021 EF 25 to 30% and recommendation for right left heart catheterization for further ischemic work-up and better understanding of hemodynamics.  Assessment & Plan    Acute on chronic HFrEF with newly reduced EF - Echo this admission showed newly reduced EF 25-30% - Volume status improving with IV diuresis - UOP -1.9L, net -10.3L - continue Losartan 50mg  daily and coreg 3.125mg   BID. BP limiting GDMT - kidney function wnl - lasix increase to IV 40mg  Q6H  with metolazone 2.5mg  daily - He is still volume overloaded on exam,continue with diuresis, can consider increasing, however caution with BPs - plan for [facility] once volume status is optimized.   HTN - BP intermittently soft - continue coreg and Losartan   HLD - LDL 51 01/2021 - continue statin   Hypothyroidism - continue synthroid - per IM   Obesity - recommend weight loss    For questions or updates, please contact [place] Please consult [email or web address] for contact info under        Signed, Cadence Ninfa Meeker, PA-C  01/24/2021, 10:49 AM     I have seen and examined this patient with Cadence Furth.  Agree with above, note added to reflect my findings.  On exam, RRR, no murmurs, lungs clear, 2-3+ LE edema.    Patient remains volume overloaded.  He is net negative 10.3 L.  His creatinine is fortunately remained stable.  We Araly Kaas continue with current diuresis plan.  He Hurshell Dino need right and left heart catheterization once he is more euvolemic.  Horton Ellithorpe M. Odis Wickey MD 01/24/2021 12:37 PM

## 2021-01-24 NOTE — Progress Notes (Signed)
PROGRESS NOTE    Anthony Weber  OLI:[id number] DOB: 01-May-1945 DOA: 01/18/2021 PCP: Albina Billet, MD    Brief Narrative:  75 y.o. Caucasian male with medical history significant for depression, hypertension, diastolic CHF and hypothyroidism, presented to the emergency room with acute onset of worsening lower extremity edema with associated abdominal distention, recent dyspnea at rest and on exertion.  He admitted to orthopnea and paroxysmal nocturnal dyspnea.  He gained 15 pounds over the last couple of weeks.  No fever or chills.  He denies any cough or wheezing.  No dysuria, oliguria or hematuria or flank pain.  No chest pain or palpitations.  Started on aggressive IV diuresis.  Diuresed effectively.  Symptoms began to improve. 10/12 feels sob improving. No cp 10/13 reports "never felt better'. Able to lay flat. No other complaints.   10/14 patient denies any worsening shortness of breath, feels better.  Sleepy this AM.  No chest pain 10/15 wife at bedside.  Reports patient did not sleep well last night.  Currently sleepy.  Sleeps bed on his side.  Assessment & Plan:   Active Problems:   Acute CHF (congestive heart failure) (HCC)   Acute HFrEF (heart failure with reduced ejection fraction) (HCC)   Primary hypertension  Acute combined systolic and diastolic congestive heart failure Echo with EF 25 to 30%  Cardiology following  continue IV diuresis Continue losartan and Coreg Metolazone 2.5 once a day  10/15 continue Lasix IV 3 times daily Still needs to become more euvolemic prior to cardiac cath Once euvolemic plan to have left and right heart cath next week depending on volume overload improvement     Anasarca To above Clinically improving Continue IV Lasix as above  Essential hypertension Normotensive to low Continue Coreg and losartan    Hypothyroidism TSH at 9 Continue PTA Synthroid Free T4 1.17 Free T3 normal 10/15 follow-up with outpatient  PCP    Hyperlipidemia Continue statins  Morbid obesity BMI 88.87 This complicates overall care and prognosis  Hyponatremia- mild . Due to hypervolemia Improving with diuresis  DVT prophylaxis: SQ Lovenox Code Status: Full Family Communication: None at bedside Disposition Plan: Status is: Inpatient  Remains inpatient appropriate because:Inpatient level of care appropriate due to severity of illness  Dispo: The patient is from: Home              Anticipated d/c is to: Home              Patient currently is not medically stable to d/c.   Difficult to place patient No       Level of care: Progressive Cardiac  Consultants:  Cardiology  Procedures:  None  Antimicrobials: None   Subjective: Less short of breath.  Sleepy this a.m. as he did not sleep well last night.  No chest pain or dizziness   Objective: Vitals:   01/24/21 0020 01/24/21 0459 01/24/21 0500 01/24/21 0831  BP: 111/90 (!) 92/56  99/62  Pulse: 83 76  79  Resp: 18 18  19   Temp: 97.8 F (36.6 C) 97.8 F (36.6 C)  (!) 97.5 F (36.4 C)  TempSrc:    Oral  SpO2: 93% 95%  95%  Weight:   (!) 145.4 kg   Height:        Intake/Output Summary (Last 24 hours) at 01/24/2021 0930 Last data filed at 01/24/2021 0819 Gross per 24 hour  Intake 720 ml  Output 1900 ml  Net -1180 ml   [employer]  01/22/21 0809 01/23/21 0500 01/24/21 0500  Weight: (!) 149.5 kg (!) 148.6 kg (!) 145.4 kg    Examination: NAD, calm Decreased breath sounds at bases no wheezing  Regular S1-S2 no gallops Soft benign positive bowel sounds 2+ pitting edema, improving    Data Reviewed: I have personally reviewed following labs and imaging studies  CBC: Recent Labs  Lab 01/18/21 1654 01/19/21 0426  WBC 8.4 8.9  HGB 13.4 12.9*  HCT 42.1 39.4  MCV 92.7 92.5  PLT 151 998*   Basic Metabolic Panel: Recent Labs  Lab 01/20/21 0806 01/21/21 0555 01/22/21 0459 01/23/21 0518 01/24/21 0526  NA 137 134* 134* 135  133*  K 4.1 4.1 3.9 3.7 4.1  CL 94* 93* 95* 95* 89*  CO2 34* 33* 32 32 34*  GLUCOSE 99 102* 89 126* 92  BUN 19 25* 22 26* 29*  CREATININE 0.84 0.87 0.76 0.75 0.82  CALCIUM 9.0 8.7* 8.4* 8.4* 8.9  MG 1.9 1.9 2.2 2.4 2.1   GFR: Estimated Creatinine Clearance: 110.6 mL/min (by C-G formula based on SCr of 0.82 mg/dL). Liver Function Tests: Recent Labs  Lab 01/18/21 1654  AST 21  ALT 17  ALKPHOS 41  BILITOT 1.0  PROT 6.2*  ALBUMIN 3.4*   No results for input(s): LIPASE, AMYLASE in the last 168 hours. No results for input(s): AMMONIA in the last 168 hours. Coagulation Profile: No results for input(s): INR, PROTIME in the last 168 hours. Cardiac Enzymes: No results for input(s): CKTOTAL, CKMB, CKMBINDEX, TROPONINI in the last 168 hours. BNP (last 3 results) No results for input(s): PROBNP in the last 8760 hours. HbA1C: No results for input(s): HGBA1C in the last 72 hours. CBG: No results for input(s): GLUCAP in the last 168 hours. Lipid Profile: No results for input(s): CHOL, HDL, LDLCALC, TRIG, CHOLHDL, LDLDIRECT in the last 72 hours.  Thyroid Function Tests: No results for input(s): TSH, T4TOTAL, FREET4, T3FREE, THYROIDAB in the last 72 hours.  Anemia Panel: No results for input(s): VITAMINB12, FOLATE, FERRITIN, TIBC, IRON, RETICCTPCT in the last 72 hours. Sepsis Labs: No results for input(s): PROCALCITON, LATICACIDVEN in the last 168 hours.  Recent Results (from the past 240 hour(s))  Resp Panel by RT-PCR (Flu A&B, Covid) Nasopharyngeal Swab     Status: None   Collection Time: 01/18/21  7:54 PM   Specimen: Nasopharyngeal Swab; Nasopharyngeal(NP) swabs in vial transport medium  Result Value Ref Range Status   SARS Coronavirus 2 by RT PCR NEGATIVE NEGATIVE Final    Comment: (NOTE) SARS-CoV-2 target nucleic acids are NOT DETECTED.  The SARS-CoV-2 RNA is generally detectable in upper respiratory specimens during the acute phase of infection. The lowest concentration  of SARS-CoV-2 viral copies this assay can detect is 138 copies/mL. A negative result does not preclude SARS-Cov-2 infection and should not be used as the sole basis for treatment or other patient management decisions. A negative result may occur with  improper specimen collection/handling, submission of specimen other than nasopharyngeal swab, presence of viral mutation(s) within the areas targeted by this assay, and inadequate number of viral copies(<138 copies/mL). A negative result must be combined with clinical observations, patient history, and epidemiological information. The expected result is Negative.  Fact Sheet for Patients:  EntrepreneurPulse.com.au  Fact Sheet for Healthcare Providers:  IncredibleEmployment.be  This test is no t yet approved or cleared by the [place] FDA and  has been authorized for detection and/or diagnosis of SARS-CoV-2 by FDA under an Emergency Use Authorization (EUA). This  EUA will remain  in effect (meaning this test can be used) for the duration of the COVID-19 declaration under Section 564(b)(1) of the Act, 21 U.S.C.section 360bbb-3(b)(1), unless the authorization is terminated  or revoked sooner.       Influenza A by PCR NEGATIVE NEGATIVE Final   Influenza B by PCR NEGATIVE NEGATIVE Final    Comment: (NOTE) The Xpert Xpress SARS-CoV-2/FLU/RSV plus assay is intended as an aid in the diagnosis of influenza from Nasopharyngeal swab specimens and should not be used as a sole basis for treatment. Nasal washings and aspirates are unacceptable for Xpert Xpress SARS-CoV-2/FLU/RSV testing.  Fact Sheet for Patients: EntrepreneurPulse.com.au  Fact Sheet for Healthcare Providers: IncredibleEmployment.be  This test is not yet approved or cleared by the [place] FDA and has been authorized for detection and/or diagnosis of SARS-CoV-2 by FDA under an Emergency Use  Authorization (EUA). This EUA will remain in effect (meaning this test can be used) for the duration of the COVID-19 declaration under Section 564(b)(1) of the Act, 21 U.S.C. section 360bbb-3(b)(1), unless the authorization is terminated or revoked.  Performed at [facility], [address], [place], [place] [address]          Radiology Studies: No results found.      Scheduled Meds:  aspirin  81 mg Oral Daily   carvedilol  3.125 mg Oral BID WC   diphenhydrAMINE  25 mg Oral QHS   enoxaparin (LOVENOX) injection  0.5 mg/kg Subcutaneous Q24H   furosemide  40 mg Intravenous Q8H   levothyroxine  50 mcg Oral Q0600   losartan  50 mg Oral Daily   melatonin  5 mg Oral QHS   metolazone  2.5 mg Oral Daily   pravastatin  40 mg Oral Daily   Continuous Infusions:   LOS: 6 days    Time spent: 35 mins than 50% on COC    Nolberto Hanlon, MD [facility]   If 7PM-7AM, please contact night-coverage  01/24/2021, 9:30 AM

## 2021-01-25 DIAGNOSIS — I5021 Acute systolic (congestive) heart failure: Secondary | ICD-10-CM | POA: Diagnosis not present

## 2021-01-25 DIAGNOSIS — I10 Essential (primary) hypertension: Secondary | ICD-10-CM | POA: Diagnosis not present

## 2021-01-25 DIAGNOSIS — E039 Hypothyroidism, unspecified: Secondary | ICD-10-CM | POA: Diagnosis not present

## 2021-01-25 LAB — CREATININE, SERUM
Creatinine, Ser: 0.92 mg/dL (ref 0.61–1.24)
GFR, Estimated: >60 mL/min (ref >60)

## 2021-01-25 LAB — MAGNESIUM: Magnesium: 1.8 mg/dL (ref 1.7–2.4)

## 2021-01-25 LAB — POTASSIUM: Potassium: 4.1 mmol/L (ref 3.5–5.1)

## 2021-01-25 MED ORDER — TRAZODONE HCL 100 MG PO TABS
100.0000 mg | ORAL_TABLET | Freq: Every evening | ORAL | Status: DC | PRN
  Administered 2021-01-25 – 2021-01-29 (×5): 100 mg via ORAL
  Filled 2021-01-25 (×6): qty 1

## 2021-01-25 MED ORDER — LORAZEPAM 1 MG PO TABS: 1.0000 mg | ORAL_TABLET | Freq: Once | ORAL | Status: DC | PRN

## 2021-01-25 MED ORDER — DIPHENHYDRAMINE HCL 25 MG PO CAPS: 25.0000 mg | ORAL_CAPSULE | Freq: Every evening | ORAL | Status: DC | PRN

## 2021-01-25 NOTE — Progress Notes (Signed)
PROGRESS NOTE    Anthony Weber  CBS:[id number] DOB: 29-May-1945 DOA: 01/18/2021 PCP: Albina Billet, MD    Brief Narrative:  75 y.o. Caucasian male with medical history significant for depression, hypertension, diastolic CHF and hypothyroidism, presented to the emergency room with acute onset of worsening lower extremity edema with associated abdominal distention, recent dyspnea at rest and on exertion.  He admitted to orthopnea and paroxysmal nocturnal dyspnea.  He gained 15 pounds over the last couple of weeks.  No fever or chills.  He denies any cough or wheezing.  No dysuria, oliguria or hematuria or flank pain.  No chest pain or palpitations.  Started on aggressive IV diuresis.  Diuresed effectively.  Symptoms began to improve. 10/12 feels sob improving. No cp 10/13 reports "never felt better'. Able to lay flat. No other complaints.   10/14 patient denies any worsening shortness of breath, feels better.  Sleepy this AM.  No chest pain 10/15 wife at bedside.  Reports patient did not sleep well last night.  Currently sleepy.  Sleeps bed on his side. 10/16 wife at bedside, reports sob better. Slept better last night. No cp  Assessment & Plan:   Active Problems:   Acute CHF (congestive heart failure) (HCC)   Acute HFrEF (heart failure with reduced ejection fraction) (HCC)   Primary hypertension  Acute combined systolic and diastolic congestive heart failure Echo with EF 25 to 30%  Cardiology following  continue IV diuresis Continue losartan and Coreg Metolazone 2.5 once a day  10/16 continue IV Lasix 3 times daily Once more euvolemic will need right and left heart cath hopefully next week     Anasarca Clinically improving Continue IV Lasix   Essential hypertension Normotensive to low Continue Coreg and losartan   Hypothyroidism TSH at 9 Continue PTA Synthroid Free T4 1.17 Free T3 normal 10/16 follow-up with outpatient PCP      Hyperlipidemia Continue  statins  Morbid obesity BMI 84.66 This complicates overall care and prognosis  Hyponatremia- mild . Due to hypervolemia Improving with diuresis  DVT prophylaxis: SQ Lovenox Code Status: Full Family Communication: None at bedside Disposition Plan: Status is: Inpatient  Remains inpatient appropriate because:Inpatient level of care appropriate due to severity of illness  Dispo: The patient is from: Home              Anticipated d/c is to: Home              Patient currently is not medically stable to d/c.   Difficult to place patient No       Level of care: Progressive Cardiac  Consultants:  Cardiology  Procedures:  None  Antimicrobials: None   Subjective: No chest pain, dizziness or lightheadedness   Objective: Vitals:   01/25/21 0325 01/25/21 0411 01/25/21 0511 01/25/21 0700  BP: (!) 104/54 (!) 81/62  (!) 114/55  Pulse: (!) 32 60  60  Resp: 15 18  17   Temp: 97.9 F (36.6 C) 98.3 F (36.8 C)  98.7 F (37.1 C)  TempSrc:  Oral  Oral  SpO2: (!) 87% 92%    Weight:   (!) 141.8 kg   Height:        Intake/Output Summary (Last 24 hours) at 01/25/2021 0851 Last data filed at 01/24/2021 2100 Gross per 24 hour  Intake 840 ml  Output 1600 ml  Net -760 ml   Filed Weights   01/23/21 0500 01/24/21 0500 01/25/21 0511  Weight: (!) 148.6 kg (!) 145.4 kg Marland Kitchen)  141.8 kg    Examination: Calm, nad Cta no wr/r Reg s1/s2 nogallop Soft benign +bs Decrease pitting edema b/l    Data Reviewed: I have personally reviewed following labs and imaging studies  CBC: Recent Labs  Lab 01/18/21 1654 01/19/21 0426  WBC 8.4 8.9  HGB 13.4 12.9*  HCT 42.1 39.4  MCV 92.7 92.5  PLT 151 440*   Basic Metabolic Panel: Recent Labs  Lab 01/20/21 0806 01/21/21 0555 01/22/21 0459 01/23/21 0518 01/24/21 0526 01/25/21 0543  NA 137 134* 134* 135 133*  --   K 4.1 4.1 3.9 3.7 4.1 4.1  CL 94* 93* 95* 95* 89*  --   CO2 34* 33* 32 32 34*  --   GLUCOSE 99 102* 89 126* 92   --   BUN 19 25* 22 26* 29*  --   CREATININE 0.84 0.87 0.76 0.75 0.82 0.92  CALCIUM 9.0 8.7* 8.4* 8.4* 8.9  --   MG 1.9 1.9 2.2 2.4 2.1 1.8   GFR: Estimated Creatinine Clearance: 97.1 mL/min (by C-G formula based on SCr of 0.92 mg/dL). Liver Function Tests: Recent Labs  Lab 01/18/21 1654  AST 21  ALT 17  ALKPHOS 41  BILITOT 1.0  PROT 6.2*  ALBUMIN 3.4*   No results for input(s): LIPASE, AMYLASE in the last 168 hours. No results for input(s): AMMONIA in the last 168 hours. Coagulation Profile: No results for input(s): INR, PROTIME in the last 168 hours. Cardiac Enzymes: No results for input(s): CKTOTAL, CKMB, CKMBINDEX, TROPONINI in the last 168 hours. BNP (last 3 results) No results for input(s): PROBNP in the last 8760 hours. HbA1C: No results for input(s): HGBA1C in the last 72 hours. CBG: No results for input(s): GLUCAP in the last 168 hours. Lipid Profile: No results for input(s): CHOL, HDL, LDLCALC, TRIG, CHOLHDL, LDLDIRECT in the last 72 hours.  Thyroid Function Tests: No results for input(s): TSH, T4TOTAL, FREET4, T3FREE, THYROIDAB in the last 72 hours.  Anemia Panel: No results for input(s): VITAMINB12, FOLATE, FERRITIN, TIBC, IRON, RETICCTPCT in the last 72 hours. Sepsis Labs: No results for input(s): PROCALCITON, LATICACIDVEN in the last 168 hours.  Recent Results (from the past 240 hour(s))  Resp Panel by RT-PCR (Flu A&B, Covid) Nasopharyngeal Swab     Status: None   Collection Time: 01/18/21  7:54 PM   Specimen: Nasopharyngeal Swab; Nasopharyngeal(NP) swabs in vial transport medium  Result Value Ref Range Status   SARS Coronavirus 2 by RT PCR NEGATIVE NEGATIVE Final    Comment: (NOTE) SARS-CoV-2 target nucleic acids are NOT DETECTED.  The SARS-CoV-2 RNA is generally detectable in upper respiratory specimens during the acute phase of infection. The lowest concentration of SARS-CoV-2 viral copies this assay can detect is 138 copies/mL. A negative result  does not preclude SARS-Cov-2 infection and should not be used as the sole basis for treatment or other patient management decisions. A negative result may occur with  improper specimen collection/handling, submission of specimen other than nasopharyngeal swab, presence of viral mutation(s) within the areas targeted by this assay, and inadequate number of viral copies(<138 copies/mL). A negative result must be combined with clinical observations, patient history, and epidemiological information. The expected result is Negative.  Fact Sheet for Patients:  EntrepreneurPulse.com.au  Fact Sheet for Healthcare Providers:  IncredibleEmployment.be  This test is no t yet approved or cleared by the [place] FDA and  has been authorized for detection and/or diagnosis of SARS-CoV-2 by FDA under an Emergency Use Authorization (EUA). This  EUA will remain  in effect (meaning this test can be used) for the duration of the COVID-19 declaration under Section 564(b)(1) of the Act, 21 U.S.C.section 360bbb-3(b)(1), unless the authorization is terminated  or revoked sooner.       Influenza A by PCR NEGATIVE NEGATIVE Final   Influenza B by PCR NEGATIVE NEGATIVE Final    Comment: (NOTE) The Xpert Xpress SARS-CoV-2/FLU/RSV plus assay is intended as an aid in the diagnosis of influenza from Nasopharyngeal swab specimens and should not be used as a sole basis for treatment. Nasal washings and aspirates are unacceptable for Xpert Xpress SARS-CoV-2/FLU/RSV testing.  Fact Sheet for Patients: EntrepreneurPulse.com.au  Fact Sheet for Healthcare Providers: IncredibleEmployment.be  This test is not yet approved or cleared by the [place] FDA and has been authorized for detection and/or diagnosis of SARS-CoV-2 by FDA under an Emergency Use Authorization (EUA). This EUA will remain in effect (meaning this test can be used) for  the duration of the COVID-19 declaration under Section 564(b)(1) of the Act, 21 U.S.C. section 360bbb-3(b)(1), unless the authorization is terminated or revoked.  Performed at [facility], [address] [place], [place] [address]          Radiology Studies: No results found.      Scheduled Meds:  aspirin  81 mg Oral Daily   carvedilol  3.125 mg Oral BID WC   diphenhydrAMINE  25 mg Oral QHS   enoxaparin (LOVENOX) injection  0.5 mg/kg Subcutaneous Q24H   furosemide  40 mg Intravenous Q8H   levothyroxine  50 mcg Oral Q0600   losartan  50 mg Oral Daily   melatonin  5 mg Oral QHS   metolazone  2.5 mg Oral Daily   pravastatin  40 mg Oral Daily   Continuous Infusions:   LOS: 7 days    Time spent: 35 mins than 50% on COC    Nolberto Hanlon, MD [facility]   If 7PM-7AM, please contact night-coverage  01/25/2021, 8:51 AM

## 2021-01-25 NOTE — Progress Notes (Signed)
Progress Note  Patient Name: Anthony Weber Date of Encounter: 01/25/2021  [facility] Cardiologist: None   Subjective   Net -1.3 L yesterday, total net -11.6 L since admission.  Breathing has improved and is essentially normal.  No chest pain.  He does remain volume overloaded with lower extremity edema.  He is tired of being in the hospital.  Inpatient Medications    Scheduled Meds:  aspirin  81 mg Oral Daily   carvedilol  3.125 mg Oral BID WC   diphenhydrAMINE  25 mg Oral QHS   enoxaparin (LOVENOX) injection  0.5 mg/kg Subcutaneous Q24H   furosemide  40 mg Intravenous Q8H   levothyroxine  50 mcg Oral Q0600   losartan  50 mg Oral Daily   melatonin  5 mg Oral QHS   metolazone  2.5 mg Oral Daily   pravastatin  40 mg Oral Daily   Continuous Infusions:  PRN Meds: acetaminophen **OR** acetaminophen, magnesium hydroxide, ondansetron **OR** ondansetron (ZOFRAN) IV, traZODone   Vital Signs    Vitals:   01/25/21 0411 01/25/21 0511 01/25/21 0700 01/25/21 1047  BP: (!) 81/62  (!) 114/55 (!) 112/58  Pulse: 60  60 (!) 57  Resp: 18  17 17   Temp: 98.3 F (36.8 C)  98.7 F (37.1 C) 98.7 F (37.1 C)  TempSrc: Oral  Oral   SpO2: 92%   93%  Weight:  (!) 141.8 kg    Height:        Intake/Output Summary (Last 24 hours) at 01/25/2021 1207 Last data filed at 01/25/2021 1026 Gross per 24 hour  Intake 720 ml  Output 2000 ml  Net -1280 ml    Last 3 Weights 01/25/2021 01/24/2021 01/23/2021  Weight (lbs) 312 lb 9.8 oz 320 lb 8 oz 327 lb 9.7 oz  Weight (kg) 141.8 kg 145.378 kg 148.6 kg      Telemetry    Sinus rhythm with PVCs-personally reviewed  ECG    None new  Physical Exam   GEN: Well nourished, well developed, in no acute distress  HEENT: normal  Neck: no JVD, carotid bruits, or masses Cardiac: RRR; no murmurs, rubs, or gallops, 2+ edema  Respiratory:  clear to auscultation bilaterally, normal work of breathing GI: soft, nontender, nondistended, +  BS MS: no deformity or atrophy  Skin: warm and dry Neuro:  Strength and sensation are intact Psych: euthymic mood, full affect   Labs    High Sensitivity Troponin:   Recent Labs  Lab 01/18/21 1654 01/19/21 1238  TROPONINIHS 20* 22*      Chemistry Recent Labs  Lab 01/18/21 1654 01/19/21 0426 01/22/21 0459 01/23/21 0518 01/24/21 0526 01/25/21 0543  NA 136   < > 134* 135 133*  --   K 4.9   < > 3.9 3.7 4.1 4.1  CL 100   < > 95* 95* 89*  --   CO2 31   < > 32 32 34*  --   GLUCOSE 103*   < > 89 126* 92  --   BUN 18   < > 22 26* 29*  --   CREATININE 0.80   < > 0.76 0.75 0.82 0.92  CALCIUM 8.7*   < > 8.4* 8.4* 8.9  --   MG  --    < > 2.2 2.4 2.1 1.8  PROT 6.2*  --   --   --   --   --   ALBUMIN 3.4*  --   --   --   --   --  AST 21  --   --   --   --   --   ALT 17  --   --   --   --   --   ALKPHOS 41  --   --   --   --   --   BILITOT 1.0  --   --   --   --   --   GFRNONAA >60   < > >60 >60 >60 >60  ANIONGAP 5   < > 7 8 10   --    < > = values in this interval not displayed.     Lipids  Recent Labs  Lab 01/19/21 1238  CHOL 101  TRIG 49  HDL 40*  LDLCALC 51  CHOLHDL 2.5     Hematology Recent Labs  Lab 01/18/21 1654 01/19/21 0426  WBC 8.4 8.9  RBC 4.54 4.26  HGB 13.4 12.9*  HCT 42.1 39.4  MCV 92.7 92.5  MCH 29.5 30.3  MCHC 31.8 32.7  RDW 15.1 15.1  PLT 151 129*    Thyroid  Recent Labs  Lab 01/19/21 1238 01/20/21 1221  TSH 9.059*  --   FREET4  --  1.17*     BNP Recent Labs  Lab 01/18/21 1654 01/19/21 1237  BNP 951.0* 942.4*     DDimer No results for input(s): DDIMER in the last 168 hours.   Radiology    No results found.  Cardiac Studies   Echo 01/19/2021  1. Left ventricular ejection fraction, by estimation, is 25 to 30%. The  left ventricle has severely decreased function. Left ventricular  endocardial border not optimally defined to evaluate regional wall motion.  Left ventricular diastolic parameters are   indeterminate.    2. Right ventricular systolic function was not well visualized. The right  ventricular size is normal. Tricuspid regurgitation signal is inadequate  for assessing PA pressure.   3. The mitral valve was not well visualized. No evidence of mitral valve  regurgitation. No evidence of mitral stenosis.   4. The aortic valve was not well visualized. Aortic valve regurgitation  is not visualized. No aortic stenosis is present.   5. Technically difficult study due to poor echo windows.      Very limited study even with contrast agent. Consider alternative  testing.    Echo 04/18/2020  1. Left ventricular ejection fraction, by estimation, is 50 to 55%. The  left ventricle has low normal function. The left ventricle grossly has no  regional wall motion abnormalities. Left ventricular diastolic parameters  are consistent with Grade II  diastolic dysfunction (pseudonormalization).   2. Right ventricular systolic function is normal. The right ventricular  size is normal. Tricuspid regurgitation signal is inadequate for assessing  PA pressure.   3. Left atrial size was moderately dilated.   4. Challenging images    MPI 03/2020 Pharmacological myocardial perfusion imaging study with no significant ischemia Small region of apical thinning, likely secondary to attenuation artifact Global hypokinesis, EF estimated at 25% No EKG changes concerning for ischemia at peak stress or in recovery. PVCs noted postinfusion CT attenuation correction images with mild aortic atherosclerosis in the arch, coronary calcification Moderate risk scan, consider confirmation of ejection fraction with echocardiogram  Patient Profile     75 y.o. male with pmh of  newly reduced EF 25 to 30%, hypertension, obesity, hyperlipidemia, prior pneumonia in 2013, and who is being seen today for the evaluation of exacerbation of heart failure with echo showing EF  reduced from previous 50 to 55% to 01/19/2021 EF 25 to 30% and  recommendation for right left heart catheterization for further ischemic work-up and better understanding of hemodynamics.  Assessment & Plan    1.  Acute on chronic systolic heart failure with newly reduced ejection fraction: Ejection fraction of 25 to 30%.  Volume status has been improving, net -11.6 L since admission.  We Rasheena Talmadge continue his current medications.  He does have quite a bit of fluid left as he has significant lower extremity edema.  Creatinine has remained stable.  We Rudolph Daoust continue with current management.  Krystale Rinkenberger likely need right and left heart catheterization once volume has been optimized.  2.  Hypertension: Blood pressure well controlled.  Continue heart failure medications.  3.  Hyperlipidemia: LDL 51.  Continue statin  4.  Obesity: Weight loss encouraged   For questions or updates, please contact [place] Please consult [email or web address] for contact info under        Signed, Anirudh Baiz Meredith Leeds, MD  01/25/2021, 12:07 PM     I

## 2021-01-25 NOTE — Progress Notes (Signed)
   01/25/21 0325  Assess: MEWS Score  Temp 97.9 F (36.6 C)  BP (!) 104/54  Pulse Rate (!) 32  Resp 15  Level of Consciousness Alert  SpO2 (!) 87 %  O2 Device Room Air  Assess: MEWS Score  MEWS Temp 0  MEWS Systolic 0  MEWS Pulse 2  MEWS RR 0  MEWS LOC 0  MEWS Score 2  MEWS Score Color Yellow  Assess: if the MEWS score is Yellow or Red  Were vital signs taken at a resting state? No  Focused Assessment No change from prior assessment  Does the patient meet 2 or more of the SIRS criteria? No  MEWS guidelines implemented *See Row Information* No, vital signs rechecked  Treat  Pain Scale 0-10  Pain Score 0  Patients Stated Pain Goal 0  Complains of Anxiety  Take Vital Signs  Increase Vital Sign Frequency   (recheck Vital signs patient had to use the restroom.)  Escalate  MEWS: Escalate  (No, rechecked and Vital Signs are now green)  Notify: Charge Nurse/RN  Name of Charge Nurse/RN Notified Felicia, RN  Date Charge Nurse/RN Notified 01/25/21  Time Charge Nurse/RN Notified 0423  Document  Patient Outcome Stabilized after interventions  Progress note created (see row info) Yes  Assess: SIRS CRITERIA  SIRS Temperature  0  SIRS Pulse 0  SIRS Respirations  0  SIRS WBC 0  SIRS Score Sum  0

## 2021-01-25 NOTE — Plan of Care (Signed)

## 2021-01-26 DIAGNOSIS — I10 Essential (primary) hypertension: Secondary | ICD-10-CM | POA: Diagnosis not present

## 2021-01-26 DIAGNOSIS — I5021 Acute systolic (congestive) heart failure: Secondary | ICD-10-CM | POA: Diagnosis not present

## 2021-01-26 DIAGNOSIS — E871 Hypo-osmolality and hyponatremia: Secondary | ICD-10-CM | POA: Diagnosis not present

## 2021-01-26 LAB — BASIC METABOLIC PANEL
Anion gap: 11 (ref 5–15)
BUN: 37 mg/dL — ABNORMAL HIGH (ref 8–23)
CO2: 33 mmol/L — ABNORMAL HIGH (ref 22–32)
Calcium: 8.3 mg/dL — ABNORMAL LOW (ref 8.9–10.3)
Chloride: 82 mmol/L — ABNORMAL LOW (ref 98–111)
Creatinine, Ser: 1.15 mg/dL (ref 0.61–1.24)
GFR, Estimated: >60 mL/min (ref >60)
Glucose, Bld: 112 mg/dL — ABNORMAL HIGH (ref 70–99)
Potassium: 3.7 mmol/L (ref 3.5–5.1)
Sodium: 126 mmol/L — ABNORMAL LOW (ref 135–145)

## 2021-01-26 LAB — MAGNESIUM: Magnesium: 2 mg/dL (ref 1.7–2.4)

## 2021-01-26 MED ORDER — SPIRONOLACTONE 25 MG PO TABS: 25.0000 mg | ORAL_TABLET | Freq: Every day | ORAL | Status: DC

## 2021-01-26 MED ORDER — ASPIRIN 81 MG PO CHEW
81.0000 mg | CHEWABLE_TABLET | ORAL | Status: AC
  Administered 2021-01-27: 81 mg via ORAL
  Filled 2021-01-26: qty 1

## 2021-01-26 MED ORDER — SODIUM CHLORIDE 0.9% FLUSH: 3.0000 mL | INTRAVENOUS | Status: DC | PRN

## 2021-01-26 MED ORDER — SODIUM CHLORIDE 0.9 % IV SOLN
250.0000 mL | INTRAVENOUS | Status: DC | PRN
Start: 2021-01-26 — End: 2021-01-27

## 2021-01-26 MED ORDER — SODIUM CHLORIDE 0.9% FLUSH
3.0000 mL | Freq: Two times a day (BID) | INTRAVENOUS | Status: DC
Start: 2021-01-26 — End: 2021-01-29
  Administered 2021-01-26 – 2021-01-29 (×6): 3 mL via INTRAVENOUS

## 2021-01-26 MED ORDER — CARVEDILOL 6.25 MG PO TABS
6.2500 mg | ORAL_TABLET | Freq: Two times a day (BID) | ORAL | Status: DC
  Administered 2021-01-27: 6.25 mg via ORAL
  Filled 2021-01-26 (×2): qty 1

## 2021-01-26 MED ORDER — SODIUM CHLORIDE 0.9 % IV SOLN: INTRAVENOUS | Status: DC

## 2021-01-26 NOTE — Plan of Care (Signed)
  Problem: Education: Goal: Ability to demonstrate management of disease process will improve Outcome: Progressing Goal: Ability to verbalize understanding of medication therapies will improve Outcome: Progressing Goal: Individualized Educational Video(s) Outcome: Progressing. VSS, pt slept well, SOB with activities,

## 2021-01-26 NOTE — Progress Notes (Signed)
Progress Note  Patient Name: Anthony Weber Date of Encounter: 01/26/2021  Primary Cardiologist:Dr. Rockey Situ  Subjective   No CP. Improved breathing status. Eager for catheterization.  Inpatient Medications    Scheduled Meds:  aspirin  81 mg Oral Daily   carvedilol  3.125 mg Oral BID WC   diphenhydrAMINE  25 mg Oral QHS   enoxaparin (LOVENOX) injection  0.5 mg/kg Subcutaneous Q24H   levothyroxine  50 mcg Oral Q0600   losartan  50 mg Oral Daily   melatonin  5 mg Oral QHS   pravastatin  40 mg Oral Daily   Continuous Infusions:  PRN Meds: acetaminophen **OR** acetaminophen, magnesium hydroxide, ondansetron **OR** ondansetron (ZOFRAN) IV, traZODone   Vital Signs    Vitals:   01/26/21 0739 01/26/21 0824 01/26/21 1030 01/26/21 1058  BP: 97/63   (!) 142/120  Pulse: 65   97  Resp: 17   20  Temp: 98.7 F (37.1 C)     TempSrc:      SpO2: 90% 96%    Weight:   (!) 136.9 kg   Height:        Intake/Output Summary (Last 24 hours) at 01/26/2021 1312 Last data filed at 01/26/2021 0554 Gross per 24 hour  Intake 960 ml  Output 700 ml  Net 260 ml   Last 3 Weights 01/26/2021 01/26/2021 01/26/2021  Weight (lbs) 301 lb 13 oz 320 lb 12.3 oz 320 lb 12.3 oz  Weight (kg) 136.9 kg 145.5 kg 145.5 kg      Telemetry    SR, PVCs, 80-90s - Personally Reviewed  ECG    No new tracings - Personally Reviewed  Physical Exam   GEN: No acute distress.  Joined by family Neck: JVD difficult to assess due to body habitus Cardiac: RRR, no murmurs, rubs, or gallops.  Respiratory: Clear to auscultation bilaterally. GI: Improved from earlier distention/firmness  MS: Bilateral moderate to 1+ LEE; No deformity. Neuro:  Nonfocal  Psych: Normal affect   Labs    High Sensitivity Troponin:   Recent Labs  Lab 01/18/21 1654 01/19/21 1238  TROPONINIHS 20* 22*      Chemistry Recent Labs  Lab 01/23/21 0518 01/24/21 0526 01/25/21 0543 01/26/21 0524  NA 135 133*  --  126*  K  3.7 4.1 4.1 3.7  CL 95* 89*  --  82*  CO2 32 34*  --  33*  GLUCOSE 126* 92  --  112*  BUN 26* 29*  --  37*  CREATININE 0.75 0.82 0.92 1.15  CALCIUM 8.4* 8.9  --  8.3*  GFRNONAA >60 >60 >60 >60  ANIONGAP 8 10  --  11     HematologyNo results for input(s): WBC, RBC, HGB, HCT, MCV, MCH, MCHC, RDW, PLT in the last 168 hours.  BNPNo results for input(s): BNP, PROBNP in the last 168 hours.   DDimer No results for input(s): DDIMER in the last 168 hours.   Radiology    No results found.  Cardiac Studies   Echo 01/19/2021  1. Left ventricular ejection fraction, by estimation, is 25 to 30%. The  left ventricle has severely decreased function. Left ventricular  endocardial border not optimally defined to evaluate regional wall motion.  Left ventricular diastolic parameters are   indeterminate.   2. Right ventricular systolic function was not well visualized. The right  ventricular size is normal. Tricuspid regurgitation signal is inadequate  for assessing PA pressure.   3. The mitral valve was not well visualized. No  evidence of mitral valve  regurgitation. No evidence of mitral stenosis.   4. The aortic valve was not well visualized. Aortic valve regurgitation  is not visualized. No aortic stenosis is present.   5. Technically difficult study due to poor echo windows.      Very limited study even with contrast agent. Consider alternative  testing.    Echo 04/18/2020  1. Left ventricular ejection fraction, by estimation, is 50 to 55%. The  left ventricle has low normal function. The left ventricle grossly has no  regional wall motion abnormalities. Left ventricular diastolic parameters  are consistent with Grade II  diastolic dysfunction (pseudonormalization).   2. Right ventricular systolic function is normal. The right ventricular  size is normal. Tricuspid regurgitation signal is inadequate for assessing  PA pressure.   3. Left atrial size was moderately dilated.   4.  Challenging images    MPI 03/2020 Pharmacological myocardial perfusion imaging study with no significant ischemia Small region of apical thinning, likely secondary to attenuation artifact Global hypokinesis, EF estimated at 25% No EKG changes concerning for ischemia at peak stress or in recovery. PVCs noted postinfusion CT attenuation correction images with mild aortic atherosclerosis in the arch, coronary calcification Moderate risk scan, consider confirmation of ejection fraction with echocardiogram  Patient Profile     75 y.o. male with history of newly reduced EF 25 to 30%, hypertension, obesity, hyperlipidemia, prior pneumonia in 2013, and who is being seen today for the evaluation of exacerbation of heart failure with echo showing EF reduced from previous 50 to 55% to 01/19/2021 EF 25 to 30% and recommendation for right left heart catheterization for further ischemic work-up and better understanding of hemodynamics.  Assessment & Plan    Acute on chronic HFrEF, newly reduced EF --Improved breathing status from admit date. Still volume up on exam. Echo EF 50 to 55%  EF 25-30%. Tentative plan for [facility] tomorrow with Dr. Saunders Revel for further ischemic workup of reduced EF and better understanding of hemodynamics.   Holding IV lasix given bump in Cr. Daily BMET.  Cr 0.9.  1.15 with BUN 29  37  Monitor I/os, daily weights.  Replete electrolytes. Output -1.1L with net output -20cc. Net output yesterday -1.4L. Cumulative net output -5L. Wt 145.5kg  136.9kg. Suspect wts may not be accurate. CHF education received  Restarted diet for today. NPO after midnight in preparation for [facility]. Transition from ARB to [facility] before discharge if Cr allows  Losartan held today given bump in Cr Increased to Coreg 6.25 mg twice daily for HR and BP support Future addition of spironolactone / SGLT2i before discharge if Cr allows [facility] tomorrow.  Pt/ family agreeable to this plan. Shared Decision  Making/Informed Consent The risks [stroke (1 in 1000), death (1 in 1000), kidney failure [usually temporary] (1 in 500), bleeding (1 in 200), allergic reaction [possibly serious] (1 in 200)], benefits (diagnostic support and management of coronary artery disease) and alternatives of a cardiac catheterization were discussed in detail with Mr. Yasui and he is willing to proceed.    Essential hypertension BP suboptimal with diuresis and ARB held earlier today. Increased Coreg for additional support. Continue losartan. Restart of diuresis, ARB with possible transition to [place], +/- addition of spironolactone pending improved Cr.    HLD LDL 51.  Continue statin.     Obesity Lifestyle changes and weight loss advised.  For questions or updates, please contact [place] Please consult [email or web address] for contact info under  Signed, Arvil Chaco, PA-C  01/26/2021, 1:12 PM

## 2021-01-26 NOTE — Care Management Important Message (Signed)
Important Message  Patient Details  Name: Torrance Stockley MRN: [id number] Date of Birth: [birth year]   Medicare Important Message Given:  Yes     Dannette Barbara 01/26/2021, 2:27 PM

## 2021-01-26 NOTE — Progress Notes (Signed)
Per dr. Kurtis Bushman okay to give scheduled coreg, aspirin. Hold losartan and discontinue metolazone. Will continue to monitor

## 2021-01-26 NOTE — Progress Notes (Signed)
OT Cancellation Note  Patient Details Name: Anthony Weber MRN: [id number] DOB: February 03, 1946   Cancelled Treatment:    Reason Eval/Treat Not Completed: Fatigue/lethargy limiting ability to participate;Medical issues which prohibited therapy  Pt + spouse declined tx due to current medical condition and heart cath pending 10/18.  Nino Glow 01/26/2021, 2:58 PM

## 2021-01-26 NOTE — Progress Notes (Signed)
Per Marrianne Mood PA okay to hold scheduled dose of coreg due to blood pressure 91/57

## 2021-01-26 NOTE — Progress Notes (Signed)
PROGRESS NOTE    Anthony Weber  ZYY:[id number] DOB: October 15, 1945 DOA: 01/18/2021 PCP: Albina Billet, MD    Brief Narrative:  75 y.o. Caucasian male with medical history significant for depression, hypertension, diastolic CHF and hypothyroidism, presented to the emergency room with acute onset of worsening lower extremity edema with associated abdominal distention, recent dyspnea at rest and on exertion.  He admitted to orthopnea and paroxysmal nocturnal dyspnea.  He gained 15 pounds over the last couple of weeks.  No fever or chills.  He denies any cough or wheezing.  No dysuria, oliguria or hematuria or flank pain.  No chest pain or palpitations.  Started on aggressive IV diuresis.  Diuresed effectively.  Symptoms began to improve. 10/12 feels sob improving. No cp 10/13 reports "never felt better'. Able to lay flat. No other complaints.   10/14 patient denies any worsening shortness of breath, feels better.  Sleepy this AM.  No chest pain 10/15 wife at bedside.  Reports patient did not sleep well last night.  Currently sleepy.  Sleeps bed on his side. 10/16 wife at bedside, reports sob better. Slept better last night. No cp 10/17 patient laying in flat in bed.  Denies any shortness of breath.  Sodium dropped to 126 this a.m.   Assessment & Plan:   Active Problems:   Acute CHF (congestive heart failure) (HCC)   Acute HFrEF (heart failure with reduced ejection fraction) (HCC)   Primary hypertension  Acute combined systolic and diastolic congestive heart failure Echo with EF 25 to 30%  Cardiology following  continue IV diuresis Continue losartan and Coreg Metolazone 2.5 once a day  10/17 hold Lasix since sodium 126.  Notify cardiology on-call. Appears to be able to lay flat in bed. Plan for right and left heart cath, will defer to cardiology     Anasarca Clinically improved with diuresis Holding Lasix as above  Essential hypertension Normotensive to low Continue Coreg  and losartan    Hypothyroidism TSH at 9 Continue PTA Synthroid Free T4 1.17 Free T3 normal 10/17 follow-up with outpatient PCP       Hyperlipidemia Continue statins  Morbid obesity BMI 48.88 This complicates overall care and prognosis  Hyponatremia- mild . Due to hypervolemia Improving with diuresis  DVT prophylaxis: SQ Lovenox Code Status: Full Family Communication: None at bedside Disposition Plan: Status is: Inpatient  Remains inpatient appropriate because:Inpatient level of care appropriate due to severity of illness  Dispo: The patient is from: Home              Anticipated d/c is to: Home              Patient currently is not medically stable to d/c.   Difficult to place patient No       Level of care: Progressive Cardiac  Consultants:  Cardiology  Procedures:  None  Antimicrobials: None   Subjective: No chest pain, shortness of breath has improved.  No dizziness   Objective: Vitals:   01/26/21 0627 01/26/21 0628 01/26/21 0739 01/26/21 0824  BP: (!) 108/50  97/63   Pulse: (!) 44  65   Resp: 20  17   Temp: 98.1 F (36.7 C)  98.7 F (37.1 C)   TempSrc:      SpO2: 90%  90% 96%  Weight:  (!) 145.5 kg    Height:        Intake/Output Summary (Last 24 hours) at 01/26/2021 0830 Last data filed at 01/26/2021 0554 Gross per 24  hour  Intake 1080 ml  Output 1100 ml  Net -20 ml   Filed Weights   01/25/21 0511 01/26/21 0500 01/26/21 0628  Weight: (!) 141.8 kg (!) 145.5 kg (!) 145.5 kg    Examination: Calm, NAD CTA no wheeze rales Regular S1-S2 no gallops Soft benign positive bowel sounds Decrease lower extremity edema bilaterally [id number]    Data Reviewed: I have personally reviewed following labs and imaging studies  CBC: No results for input(s): WBC, NEUTROABS, HGB, HCT, MCV, PLT in the last 168 hours.  Basic Metabolic Panel: Recent Labs  Lab 01/21/21 0555 01/22/21 0459 01/23/21 0518 01/24/21 0526 01/25/21 0543  01/26/21 0524  NA 134* 134* 135 133*  --  126*  K 4.1 3.9 3.7 4.1 4.1 3.7  CL 93* 95* 95* 89*  --  82*  CO2 33* 32 32 34*  --  33*  GLUCOSE 102* 89 126* 92  --  112*  BUN 25* 22 26* 29*  --  37*  CREATININE 0.87 0.76 0.75 0.82 0.92 1.15  CALCIUM 8.7* 8.4* 8.4* 8.9  --  8.3*  MG 1.9 2.2 2.4 2.1 1.8 2.0   GFR: Estimated Creatinine Clearance: 78.8 mL/min (by C-G formula based on SCr of 1.15 mg/dL). Liver Function Tests: No results for input(s): AST, ALT, ALKPHOS, BILITOT, PROT, ALBUMIN in the last 168 hours.  No results for input(s): LIPASE, AMYLASE in the last 168 hours. No results for input(s): AMMONIA in the last 168 hours. Coagulation Profile: No results for input(s): INR, PROTIME in the last 168 hours. Cardiac Enzymes: No results for input(s): CKTOTAL, CKMB, CKMBINDEX, TROPONINI in the last 168 hours. BNP (last 3 results) No results for input(s): PROBNP in the last 8760 hours. HbA1C: No results for input(s): HGBA1C in the last 72 hours. CBG: No results for input(s): GLUCAP in the last 168 hours. Lipid Profile: No results for input(s): CHOL, HDL, LDLCALC, TRIG, CHOLHDL, LDLDIRECT in the last 72 hours.  Thyroid Function Tests: No results for input(s): TSH, T4TOTAL, FREET4, T3FREE, THYROIDAB in the last 72 hours.  Anemia Panel: No results for input(s): VITAMINB12, FOLATE, FERRITIN, TIBC, IRON, RETICCTPCT in the last 72 hours. Sepsis Labs: No results for input(s): PROCALCITON, LATICACIDVEN in the last 168 hours.  Recent Results (from the past 240 hour(s))  Resp Panel by RT-PCR (Flu A&B, Covid) Nasopharyngeal Swab     Status: None   Collection Time: 01/18/21  7:54 PM   Specimen: Nasopharyngeal Swab; Nasopharyngeal(NP) swabs in vial transport medium  Result Value Ref Range Status   SARS Coronavirus 2 by RT PCR NEGATIVE NEGATIVE Final    Comment: (NOTE) SARS-CoV-2 target nucleic acids are NOT DETECTED.  The SARS-CoV-2 RNA is generally detectable in upper  respiratory specimens during the acute phase of infection. The lowest concentration of SARS-CoV-2 viral copies this assay can detect is 138 copies/mL. A negative result does not preclude SARS-Cov-2 infection and should not be used as the sole basis for treatment or other patient management decisions. A negative result may occur with  improper specimen collection/handling, submission of specimen other than nasopharyngeal swab, presence of viral mutation(s) within the areas targeted by this assay, and inadequate number of viral copies(<138 copies/mL). A negative result must be combined with clinical observations, patient history, and epidemiological information. The expected result is Negative.  Fact Sheet for Patients:  EntrepreneurPulse.com.au  Fact Sheet for Healthcare Providers:  IncredibleEmployment.be  This test is no t yet approved or cleared by the [place] FDA and  has been  authorized for detection and/or diagnosis of SARS-CoV-2 by FDA under an Emergency Use Authorization (EUA). This EUA will remain  in effect (meaning this test can be used) for the duration of the COVID-19 declaration under Section 564(b)(1) of the Act, 21 U.S.C.section 360bbb-3(b)(1), unless the authorization is terminated  or revoked sooner.       Influenza A by PCR NEGATIVE NEGATIVE Final   Influenza B by PCR NEGATIVE NEGATIVE Final    Comment: (NOTE) The Xpert Xpress SARS-CoV-2/FLU/RSV plus assay is intended as an aid in the diagnosis of influenza from Nasopharyngeal swab specimens and should not be used as a sole basis for treatment. Nasal washings and aspirates are unacceptable for Xpert Xpress SARS-CoV-2/FLU/RSV testing.  Fact Sheet for Patients: EntrepreneurPulse.com.au  Fact Sheet for Healthcare Providers: IncredibleEmployment.be  This test is not yet approved or cleared by the [place] FDA and has been  authorized for detection and/or diagnosis of SARS-CoV-2 by FDA under an Emergency Use Authorization (EUA). This EUA will remain in effect (meaning this test can be used) for the duration of the COVID-19 declaration under Section 564(b)(1) of the Act, 21 U.S.C. section 360bbb-3(b)(1), unless the authorization is terminated or revoked.  Performed at [facility], [address], [place], [place] [address]          Radiology Studies: No results found.      Scheduled Meds:  aspirin  81 mg Oral Daily   carvedilol  3.125 mg Oral BID WC   diphenhydrAMINE  25 mg Oral QHS   enoxaparin (LOVENOX) injection  0.5 mg/kg Subcutaneous Q24H   levothyroxine  50 mcg Oral Q0600   losartan  50 mg Oral Daily   melatonin  5 mg Oral QHS   metolazone  2.5 mg Oral Daily   pravastatin  40 mg Oral Daily   Continuous Infusions:   LOS: 8 days    Time spent: 35 mins than 50% on [place], MD [facility]   If 7PM-7AM, please contact night-coverage  01/26/2021, 8:30 AM

## 2021-01-27 ENCOUNTER — Encounter
Admission: EM | Disposition: A | Discharge status: Other Acute Inpatient Hospital | Payer: Self-pay | Source: Home / Self Care | Attending: Internal Medicine

## 2021-01-27 ENCOUNTER — Inpatient Hospital Stay: Payer: Self-pay

## 2021-01-27 ENCOUNTER — Inpatient Hospital Stay: Payer: Medicare HMO

## 2021-01-27 DIAGNOSIS — I5021 Acute systolic (congestive) heart failure: Secondary | ICD-10-CM | POA: Diagnosis not present

## 2021-01-27 DIAGNOSIS — E871 Hypo-osmolality and hyponatremia: Secondary | ICD-10-CM | POA: Diagnosis not present

## 2021-01-27 DIAGNOSIS — N179 Acute kidney failure, unspecified: Secondary | ICD-10-CM | POA: Diagnosis not present

## 2021-01-27 DIAGNOSIS — I493 Ventricular premature depolarization: Secondary | ICD-10-CM

## 2021-01-27 DIAGNOSIS — I10 Essential (primary) hypertension: Secondary | ICD-10-CM | POA: Diagnosis not present

## 2021-01-27 DIAGNOSIS — I5033 Acute on chronic diastolic (congestive) heart failure: Secondary | ICD-10-CM | POA: Diagnosis not present

## 2021-01-27 DIAGNOSIS — I5041 Acute combined systolic (congestive) and diastolic (congestive) heart failure: Secondary | ICD-10-CM | POA: Diagnosis not present

## 2021-01-27 HISTORY — PX: RIGHT/LEFT HEART CATH AND CORONARY ANGIOGRAPHY: CATH118266

## 2021-01-27 LAB — BRAIN NATRIURETIC PEPTIDE: B Natriuretic Peptide: 681 pg/mL — ABNORMAL HIGH (ref 0.0–100.0)

## 2021-01-27 LAB — BASIC METABOLIC PANEL
Anion gap: 10 (ref 5–15)
Anion gap: 12 (ref 5–15)
Anion gap: 11 (ref 5–15)
BUN: 53 mg/dL — ABNORMAL HIGH (ref 8–23)
BUN: 59 mg/dL — ABNORMAL HIGH (ref 8–23)
BUN: 66 mg/dL — ABNORMAL HIGH (ref 8–23)
CO2: 32 mmol/L (ref 22–32)
CO2: 32 mmol/L (ref 22–32)
CO2: 35 mmol/L — ABNORMAL HIGH (ref 22–32)
Calcium: 7.9 mg/dL — ABNORMAL LOW (ref 8.9–10.3)
Calcium: 8 mg/dL — ABNORMAL LOW (ref 8.9–10.3)
Calcium: 8.1 mg/dL — ABNORMAL LOW (ref 8.9–10.3)
Chloride: 84 mmol/L — ABNORMAL LOW (ref 98–111)
Chloride: 82 mmol/L — ABNORMAL LOW (ref 98–111)
Chloride: 81 mmol/L — ABNORMAL LOW (ref 98–111)
Creatinine, Ser: 1.24 mg/dL (ref 0.61–1.24)
Creatinine, Ser: 1.49 mg/dL — ABNORMAL HIGH (ref 0.61–1.24)
Creatinine, Ser: 1.38 mg/dL — ABNORMAL HIGH (ref 0.61–1.24)
GFR, Estimated: >60 mL/min (ref >60)
GFR, Estimated: 48 mL/min — ABNORMAL LOW (ref >60)
GFR, Estimated: 53 mL/min — ABNORMAL LOW (ref >60)
Glucose, Bld: 102 mg/dL — ABNORMAL HIGH (ref 70–99)
Glucose, Bld: 104 mg/dL — ABNORMAL HIGH (ref 70–99)
Glucose, Bld: 142 mg/dL — ABNORMAL HIGH (ref 70–99)
Potassium: 3.5 mmol/L (ref 3.5–5.1)
Potassium: 4 mmol/L (ref 3.5–5.1)
Potassium: 4 mmol/L (ref 3.5–5.1)
Sodium: 126 mmol/L — ABNORMAL LOW (ref 135–145)
Sodium: 126 mmol/L — ABNORMAL LOW (ref 135–145)
Sodium: 127 mmol/L — ABNORMAL LOW (ref 135–145)

## 2021-01-27 LAB — MAGNESIUM
Magnesium: 2.2 mg/dL (ref 1.7–2.4)
Magnesium: 2.5 mg/dL — ABNORMAL HIGH (ref 1.7–2.4)

## 2021-01-27 LAB — CBC
HCT: 37 % — ABNORMAL LOW (ref 39.0–52.0)
Hemoglobin: 12.8 g/dL — ABNORMAL LOW (ref 13.0–17.0)
MCH: 30.3 pg (ref 26.0–34.0)
MCHC: 34.6 g/dL (ref 30.0–36.0)
MCV: 87.7 fL (ref 80.0–100.0)
Platelets: 92 10*3/uL — ABNORMAL LOW (ref 150–400)
RBC: 4.22 MIL/uL (ref 4.22–5.81)
RDW: 14.5 % (ref 11.5–15.5)
WBC: 6.9 10*3/uL (ref 4.0–10.5)
nRBC: 0 % (ref 0.0–0.2)

## 2021-01-27 LAB — COOXEMETRY PANEL
Carboxyhemoglobin: 1.1 % (ref 0.5–1.5)
Methemoglobin: 0.4 % (ref 0.0–1.5)
O2 Saturation: 62.8 %
Total hemoglobin: 13.6 g/dL (ref 12.0–16.0)
Total oxygen content: 61.9 mL/dL

## 2021-01-27 SURGERY — RIGHT/LEFT HEART CATH AND CORONARY ANGIOGRAPHY
Anesthesia: Moderate Sedation

## 2021-01-27 MED ORDER — HEPARIN (PORCINE) IN NACL 1000-0.9 UT/500ML-% IV SOLN
INTRAVENOUS | Status: DC | PRN
  Administered 2021-01-27: 1000 mL

## 2021-01-27 MED ORDER — SODIUM CHLORIDE 0.9% FLUSH: 3.0000 mL | INTRAVENOUS | Status: DC | PRN

## 2021-01-27 MED ORDER — MIDAZOLAM HCL 2 MG/2ML IJ SOLN
INTRAMUSCULAR | Status: AC
  Filled 2021-01-27: qty 2

## 2021-01-27 MED ORDER — LIDOCAINE HCL 1 % IJ SOLN
INTRAMUSCULAR | Status: AC
  Filled 2021-01-27: qty 20

## 2021-01-27 MED ORDER — HEPARIN SODIUM (PORCINE) 1000 UNIT/ML IJ SOLN
INTRAMUSCULAR | Status: AC
  Filled 2021-01-27: qty 1

## 2021-01-27 MED ORDER — VERAPAMIL HCL 2.5 MG/ML IV SOLN
INTRAVENOUS | Status: AC
  Filled 2021-01-27: qty 2

## 2021-01-27 MED ORDER — IOHEXOL 350 MG/ML SOLN
INTRAVENOUS | Status: DC | PRN
  Administered 2021-01-27: 17 mL

## 2021-01-27 MED ORDER — FENTANYL CITRATE (PF) 100 MCG/2ML IJ SOLN
INTRAMUSCULAR | Status: AC
  Filled 2021-01-27: qty 2

## 2021-01-27 MED ORDER — SODIUM CHLORIDE 0.9 % IV SOLN: 250.0000 mL | INTRAVENOUS | Status: DC | PRN

## 2021-01-27 MED ORDER — MILRINONE LACTATE IN DEXTROSE 20-5 MG/100ML-% IV SOLN
0.1250 ug/kg/min | INTRAVENOUS | Status: DC
  Administered 2021-01-27: 0.25 ug/kg/min via INTRAVENOUS
  Administered 2021-01-28 – 2021-01-29 (×3): 0.125 ug/kg/min via INTRAVENOUS
  Filled 2021-01-27 (×4): qty 100

## 2021-01-27 MED ORDER — HEPARIN SODIUM (PORCINE) 1000 UNIT/ML IJ SOLN
INTRAMUSCULAR | Status: DC | PRN
  Administered 2021-01-27: 5000 [IU] via INTRAVENOUS

## 2021-01-27 MED ORDER — LIDOCAINE HCL (PF) 1 % IJ SOLN
INTRAMUSCULAR | Status: DC | PRN
  Administered 2021-01-27: 4 mL via SUBCUTANEOUS

## 2021-01-27 MED ORDER — POTASSIUM CHLORIDE CRYS ER 20 MEQ PO TBCR
40.0000 meq | EXTENDED_RELEASE_TABLET | Freq: Once | ORAL | Status: AC
  Administered 2021-01-27: 40 meq via ORAL
  Filled 2021-01-27: qty 4

## 2021-01-27 MED ORDER — CARVEDILOL 3.125 MG PO TABS: 3.1250 mg | ORAL_TABLET | Freq: Two times a day (BID) | ORAL | Status: DC

## 2021-01-27 MED ORDER — CHLORHEXIDINE GLUCONATE CLOTH 2 % EX PADS
6.0000 | MEDICATED_PAD | Freq: Every day | CUTANEOUS | Status: DC
  Administered 2021-01-27 – 2021-01-28 (×2): 6 via TOPICAL

## 2021-01-27 MED ORDER — VERAPAMIL HCL 2.5 MG/ML IV SOLN
INTRAVENOUS | Status: DC | PRN
  Administered 2021-01-27: 2.5 mg via INTRA_ARTERIAL

## 2021-01-27 MED ORDER — HEPARIN (PORCINE) IN NACL 1000-0.9 UT/500ML-% IV SOLN
INTRAVENOUS | Status: AC
  Filled 2021-01-27: qty 1000

## 2021-01-27 MED ORDER — SODIUM CHLORIDE 0.9% FLUSH
3.0000 mL | Freq: Two times a day (BID) | INTRAVENOUS | Status: DC
  Administered 2021-01-27 – 2021-01-29 (×4): 3 mL via INTRAVENOUS

## 2021-01-27 MED ORDER — HYDRALAZINE HCL 20 MG/ML IJ SOLN: 10.0000 mg | INTRAMUSCULAR | Status: AC | PRN

## 2021-01-27 MED ORDER — LOSARTAN POTASSIUM 25 MG PO TABS: 12.5000 mg | ORAL_TABLET | Freq: Every day | ORAL | Status: DC

## 2021-01-27 SURGICAL SUPPLY — 14 items
CATH 5F 110X4 TIG (CATHETERS) ×2 IMPLANT
CATH BALLN WEDGE 5F 110CM (CATHETERS) ×2 IMPLANT
DEVICE RAD COMP TR BAND LRG (VASCULAR PRODUCTS) ×2 IMPLANT
DRAPE BRACHIAL (DRAPES) ×4 IMPLANT
GLIDESHEATH SLEND SS 6F .021 (SHEATH) ×2 IMPLANT
GUIDEWIRE .025 260CM (WIRE) ×2 IMPLANT
GUIDEWIRE INQWIRE 1.5J.035X260 (WIRE) ×1 IMPLANT
INQWIRE 1.5J .035X260CM (WIRE) ×2
PACK CARDIAC CATH (CUSTOM PROCEDURE TRAY) ×2 IMPLANT
PANNUS RETENTION SYSTEM 2 PAD (MISCELLANEOUS) ×2 IMPLANT
PROTECTION STATION PRESSURIZED (MISCELLANEOUS) ×2
SET ATX SIMPLICITY (MISCELLANEOUS) ×2 IMPLANT
SHEATH GLIDE SLENDER 4/5FR (SHEATH) ×2 IMPLANT
STATION PROTECTION PRESSURIZED (MISCELLANEOUS) ×1 IMPLANT

## 2021-01-27 NOTE — Procedures (Signed)
Central Venous Catheter Insertion Procedure Note  Anthony Weber  [id number]  12-19-1945  Date:01/27/21  Time:9:42 PM   Provider Performing:Caisen Mangas [name]   Procedure: Insertion of Non-tunneled Central Venous [phone]) with US guidance ([id number])   Indication(s) Medication administration and Difficult access  Consent Risks of the procedure as well as the alternatives and risks of each were explained to the patient and/or caregiver.  Consent for the procedure was obtained and is signed in the bedside chart  Anesthesia Topical only with 1% lidocaine   Timeout Verified patient identification, verified procedure, site/side was marked, verified correct patient position, special equipment/implants available, medications/allergies/relevant history reviewed, required imaging and test results available.  Sterile Technique Maximal sterile technique including full sterile barrier drape, hand hygiene, sterile gown, sterile gloves, mask, hair covering, sterile ultrasound probe cover (if used).  Procedure Description Area of catheter insertion was cleaned with chlorhexidine and draped in sterile fashion.  With real-time ultrasound guidance a central venous catheter was placed into the right internal jugular vein. Nonpulsatile blood flow and easy flushing noted in all ports.  The catheter was sutured in place and sterile dressing applied.  Complications/Tolerance None; patient tolerated the procedure well. Chest X-ray is ordered to verify placement for internal jugular or subclavian cannulation.   Chest x-ray is not ordered for femoral cannulation.  EBL Minimal  Specimen(s) None   Rufina Falco, DNP, CCRN, FNP-C, AGACNP-BC Acute Care Nurse Practitioner  [facility] Pager: [phone] [place] at [facility]

## 2021-01-27 NOTE — Interval H&P Note (Signed)
History and Physical Interval Note:  01/27/2021 1:46 PM  Anthony Weber  has presented today for surgery, with the diagnosis of heart failure with reduced ejection fraction.  The various methods of treatment have been discussed with the patient and family. After consideration of risks, benefits and other options for treatment, the patient has consented to  Procedure(s): RIGHT/LEFT HEART CATH AND CORONARY ANGIOGRAPHY (N/A) as a surgical intervention.  The patient's history has been reviewed, patient examined, no change in status, stable for surgery.  I have reviewed the patient's chart and labs.  Questions were answered to the patient's satisfaction.    Cath Lab Visit (complete for each Cath Lab visit)  Clinical Evaluation Leading to the Procedure:   ACS: No.  Non-ACS:    Anginal/Heart Failure Classification: NYHA class IV  Anti-ischemic medical therapy: Minimal Therapy (1 class of medications)  Non-Invasive Test Results: LVEF < 30% -> high risk  Prior CABG: No previous CABG        Anthony Weber

## 2021-01-27 NOTE — Progress Notes (Signed)
PROGRESS NOTE    Anthony Weber  ZJQ:[id number] DOB: 1945/08/01 DOA: 01/18/2021 PCP: Albina Billet, MD    Brief Narrative:  75 y.o. Caucasian male with medical history significant for depression, hypertension, diastolic CHF and hypothyroidism, presented to the emergency room with acute onset of worsening lower extremity edema with associated abdominal distention, recent dyspnea at rest and on exertion.  He admitted to orthopnea and paroxysmal nocturnal dyspnea.  He gained 15 pounds over the last couple of weeks.   Was on IV diuresis as he was very volume overloaded.  We have been waiting until he is able to lay flat so he can undergo cardiac cath.  IV Lasix was held on 10/17 due to hyponatremia and worsening creatinine.    10/18-cath today   Assessment & Plan:   Active Problems:   Acute CHF (congestive heart failure) (HCC)   Acute HFrEF (heart failure with reduced ejection fraction) ([place])   Primary hypertension  Acute combined systolic and diastolic congestive heart failure Echo with EF 25 to 30%  Cardiology following  continue IV diuresis Continue losartan and Coreg Metolazone 2.5 once a day  10/17 hold Lasix since sodium 126.  10/18 plan for cath today. May need inotropic agent to help out with diuresis   Anasarca Due to above Slowly improving with IV diuretics Holding diuresis for above   Essential hypertension Normotensive to low Continue coreg and losartan    Hypothyroidism TSH at 9 Continue PTA Synthroid Free T4 1.17 Free T3 normal 10/18 follow-up with outpatient PCP  follow-up with outpatient PCP      Hyperlipidemia Continue statins  Morbid obesity BMI 24.09 This complicates overall care and prognosis  Hyponatremia- mild . Due to hypervolemia Treatment as above with diuresis    DVT prophylaxis: SQ Lovenox Code Status: Full Family Communication: None at bedside Disposition Plan: Status is: Inpatient  Remains inpatient appropriate  because:Inpatient level of care appropriate due to severity of illness  Dispo: The patient is from: Home              Anticipated d/c is to: Home              Patient currently is not medically stable to d/c.   Difficult to place patient No       Level of care: Progressive Cardiac  Consultants:  Cardiology  Procedures:  None  Antimicrobials: None   Subjective: This a.m. has no shortness of breath or chest pain.  Denies dizziness.   Objective: Vitals:   01/27/21 0004 01/27/21 0327 01/27/21 0658 01/27/21 0736  BP: (!) 86/61 (!) 127/100  (!) 108/59  Pulse: 82 79  75  Resp: 16 16  16   Temp: 98.2 F (36.8 C) 97.8 F (36.6 C)  98.5 F (36.9 C)  TempSrc: Oral Oral  Oral  SpO2: 97% 100%  (!) 86%  Weight:   (!) 141.8 kg   Height:        Intake/Output Summary (Last 24 hours) at 01/27/2021 0823 Last data filed at 01/27/2021 7353 Gross per 24 hour  Intake 540 ml  Output 1200 ml  Net -660 ml   Filed Weights   01/26/21 0628 01/26/21 1030 01/27/21 0658  Weight: (!) 145.5 kg (!) 136.9 kg (!) 141.8 kg    Examination: Calm sleepy this AM NAD wife at bedside Decreased breath sounds at bases no wheezing or rails Regular S1-S2 no gallops soft benign positive bowel sounds Decreased lower extremity edema bilaterally Awakens answers appropriately and oriented Mood  and affect appropriate in current setting      Data Reviewed: I have personally reviewed following labs and imaging studies  CBC: Recent Labs  Lab 01/27/21 0447  WBC 6.9  HGB 12.8*  HCT 37.0*  MCV 87.7  PLT 92*    Basic Metabolic Panel: Recent Labs  Lab 01/22/21 0459 01/23/21 0518 01/24/21 0526 01/25/21 0543 01/26/21 0524 01/27/21 0447  NA 134* 135 133*  --  126* 126*  K 3.9 3.7 4.1 4.1 3.7 3.5  CL 95* 95* 89*  --  82* 84*  CO2 32 32 34*  --  33* 32  GLUCOSE 89 126* 92  --  112* 102*  BUN 22 26* 29*  --  37* 53*  CREATININE 0.76 0.75 0.82 0.92 1.15 1.24  CALCIUM 8.4* 8.4* 8.9  --   8.3* 7.9*  MG 2.2 2.4 2.1 1.8 2.0 2.2   GFR: Estimated Creatinine Clearance: 72 mL/min (by C-G formula based on SCr of 1.24 mg/dL). Liver Function Tests: No results for input(s): AST, ALT, ALKPHOS, BILITOT, PROT, ALBUMIN in the last 168 hours.  No results for input(s): LIPASE, AMYLASE in the last 168 hours. No results for input(s): AMMONIA in the last 168 hours. Coagulation Profile: No results for input(s): INR, PROTIME in the last 168 hours. Cardiac Enzymes: No results for input(s): CKTOTAL, CKMB, CKMBINDEX, TROPONINI in the last 168 hours. BNP (last 3 results) No results for input(s): PROBNP in the last 8760 hours. HbA1C: No results for input(s): HGBA1C in the last 72 hours. CBG: No results for input(s): GLUCAP in the last 168 hours. Lipid Profile: No results for input(s): CHOL, HDL, LDLCALC, TRIG, CHOLHDL, LDLDIRECT in the last 72 hours.  Thyroid Function Tests: No results for input(s): TSH, T4TOTAL, FREET4, T3FREE, THYROIDAB in the last 72 hours.  Anemia Panel: No results for input(s): VITAMINB12, FOLATE, FERRITIN, TIBC, IRON, RETICCTPCT in the last 72 hours. Sepsis Labs: No results for input(s): PROCALCITON, LATICACIDVEN in the last 168 hours.  Recent Results (from the past 240 hour(s))  Resp Panel by RT-PCR (Flu A&B, Covid) Nasopharyngeal Swab     Status: None   Collection Time: 01/18/21  7:54 PM   Specimen: Nasopharyngeal Swab; Nasopharyngeal(NP) swabs in vial transport medium  Result Value Ref Range Status   SARS Coronavirus 2 by RT PCR NEGATIVE NEGATIVE Final    Comment: (NOTE) SARS-CoV-2 target nucleic acids are NOT DETECTED.  The SARS-CoV-2 RNA is generally detectable in upper respiratory specimens during the acute phase of infection. The lowest concentration of SARS-CoV-2 viral copies this assay can detect is 138 copies/mL. A negative result does not preclude SARS-Cov-2 infection and should not be used as the sole basis for treatment or other patient  management decisions. A negative result may occur with  improper specimen collection/handling, submission of specimen other than nasopharyngeal swab, presence of viral mutation(s) within the areas targeted by this assay, and inadequate number of viral copies(<138 copies/mL). A negative result must be combined with clinical observations, patient history, and epidemiological information. The expected result is Negative.  Fact Sheet for Patients:  EntrepreneurPulse.com.au  Fact Sheet for Healthcare Providers:  IncredibleEmployment.be  This test is no t yet approved or cleared by the [place] FDA and  has been authorized for detection and/or diagnosis of SARS-CoV-2 by FDA under an Emergency Use Authorization (EUA). This EUA will remain  in effect (meaning this test can be used) for the duration of the COVID-19 declaration under Section 564(b)(1) of the Act, 21 U.S.C.section 360bbb-3(b)(1), unless  the authorization is terminated  or revoked sooner.       Influenza A by PCR NEGATIVE NEGATIVE Final   Influenza B by PCR NEGATIVE NEGATIVE Final    Comment: (NOTE) The Xpert Xpress SARS-CoV-2/FLU/RSV plus assay is intended as an aid in the diagnosis of influenza from Nasopharyngeal swab specimens and should not be used as a sole basis for treatment. Nasal washings and aspirates are unacceptable for Xpert Xpress SARS-CoV-2/FLU/RSV testing.  Fact Sheet for Patients: EntrepreneurPulse.com.au  Fact Sheet for Healthcare Providers: IncredibleEmployment.be  This test is not yet approved or cleared by the [place] FDA and has been authorized for detection and/or diagnosis of SARS-CoV-2 by FDA under an Emergency Use Authorization (EUA). This EUA will remain in effect (meaning this test can be used) for the duration of the COVID-19 declaration under Section 564(b)(1) of the Act, 21 U.S.C. section 360bbb-3(b)(1),  unless the authorization is terminated or revoked.  Performed at [facility], [address], [place], [place] [address]          Radiology Studies: No results found.      Scheduled Meds:  aspirin  81 mg Oral Daily   carvedilol  6.25 mg Oral BID WC   diphenhydrAMINE  25 mg Oral QHS   enoxaparin (LOVENOX) injection  0.5 mg/kg Subcutaneous Q24H   levothyroxine  50 mcg Oral Q0600   losartan  50 mg Oral Daily   melatonin  5 mg Oral QHS   potassium chloride  40 mEq Oral Once   pravastatin  40 mg Oral Daily   sodium chloride flush  3 mL Intravenous Q12H   Continuous Infusions:  sodium chloride     sodium chloride 75 mL/hr at 01/27/21 0750     LOS: 9 days    Time spent: 35 mins than 50% on COC    Nolberto Hanlon, MD [facility]   If 7PM-7AM, please contact night-coverage  01/27/2021, 8:23 AM

## 2021-01-27 NOTE — Progress Notes (Signed)
[facility]  Date: 01/27/21  Time: 7:17 PM  Patient just evaluated in ICU due to episodic hypotension in the setting of milrinone therapy.  Blood pressures have been soft for the last day or two, though systolic readings now typically in the 70s with MAPs around 60 since initiation of milrinone.  Patient is somnolent but arouses easily to voice and says that he feels no different.  PICC line to be placed shortly.  Right and left heart catheterization to earlier today showed no significant CAD.  Moderately elevated left heart and PA pressures noted as well as severely elevated right heart pressures.  Fick cardiac output/index was severely reduced.  Telemetry this evening shows sinus rhythm with frequent PVCs as well as some brief atrial runs.  Hopefully, PICC can be placed shortly to allow for central infusion of milrinone as well as addition of norepinephrine if necessary to maintain MAP above 60.  If ectopy becomes an issue, we may need to add IV amiodarone as well.  If SvO2, renal function, sodium, and urine output do not improve with inotropic therapy, we may need to consider transfer to [facility] for advanced heart failure evaluation.  I would like to avoid placement of mechanical support (i.e. IABP or Impella) if possible, given the patient's morbid obesity that could pose vascular access complications.  I have also asked Dr. Mortimer Fries and the critical care team to assist with management of vasoactive medications.  Nelva Bush, MD [facility]

## 2021-01-27 NOTE — Consult Note (Addendum)
NAME:  Anthony Weber, MRN:  [id number], DOB:  [birth year], LOS: 9 ADMISSION DATE:  01/18/2021, CONSULTATION DATE:  01/27/2021 REFERRING MD:  Nelva Bush, MD  REASON FOR CONSULT: Hypotension   HPI  75 y.o with significant PMH of morbid obesity, hypertension, hypothyroidism, hyperlipidemia, congestive heart failure anxiety and depression who presented to the ED with chief complaints of progressive SOB, dyspnea, abdominal distention, weight gain of 30 pounds, orthopnea, PND, and bilateral lower extremity edema x2 weeks  ED Course: On arrival to the ED, he was afebrile with blood pressure 137/76 mm Hg and pulse rate 91 beats/min. Sats 97% on 2L. There were no focal neurological deficits; he was alert and oriented x4, but with significant dyspnea with minimal exertion. Labs/Diagnostics CMP remarkable for albumin of 3.4 with total protein 6.2.  BNP was 951 and high-sensitivity troponin I was 20.  CBC was unremarkable.  Influenza antigens and COVID-19 PCR came back negative. EKG:  showed normal sinus rhythm with a rate of 86 with poor R wave progression and low voltage QRS. Imaging: Chest x-ray showed borderline cardiomegaly and vascular congestion with left lower lobe atelectasis or infiltrates with small left pleural effusion. Due to significant volume overload patient was started on IV Lasix 40 mg twice daily and admitted to PCU under hospitalist service with cardiology consultation.  Hospital Course: During the course of his admission patient had an echo which showed newly reduced EF of 25 to 30%.  Volume status continued to improve with IV diuresis, he was started on lisinopril with plan to transition to [facility], +/- addition of spironolactone pending renal function improvement. He was also continued on Coreg and Lasix increased to 40 mg Q6 with addition of metolazone for optimization.  Patient underwent right and left heart catheterization on 10/18 which showed no significant CAD.  Was noted  with moderately elevated left heart and PA pressures as well as severely elevated right heart pressures.  Telemetry showed normal sinus rhythm with frequent PVCs and brief episodes of atrial runs.  Patient was transferred to the ICU for medical optimization with inotropes, vasopressors and antiarrhythmic as needed.  Upon arrival, pt was on Milrinone gtt and noted with borderline SBP low 80s & sometimes in upper 70s. PCCM consulted to assist with management of vasoactive medication.  Past Medical History  Hypertension Hypothyroidism Hyperlipidemia Congestive heart failure Anxiety and depression Morbid obesity  Significant Hospital Events   10/09: Admitted to PCU with acute on chronic diastolic CHF exacerbation 10/10: Cardiology consulted 10/18: Patient underwent right and left heart cath.  Transferred to stepdown for medical optimization.  PCCM consulted  Consults:  Cardiology PCCM  Procedures:  10/18: Left and right heart catheterization 10/18: Right IJ central line placement  Significant Diagnostic Tests:  01/18/2021: Chest Xray>Borderline cardiomegaly, vascular congestion. Left lower lobe atelectasis or infiltrate.  Small left effusion  Micro Data:  10/9: SARS-CoV-2 PCR> negative 10/9: Influenza PCR> negative  Antimicrobials:  None  OBJECTIVE  Blood pressure (!) 77/45, pulse (!) 49, temperature 98 F (36.7 C), temperature source Oral, resp. rate 16, height 5\' 10"  (1.778 m), weight (!) 141.8 kg, SpO2 (!) 89 %.        Intake/Output Summary (Last 24 hours) at 01/27/2021 2146 Last data filed at 01/27/2021 1700 Gross per 24 hour  Intake 300 ml  Output 600 ml  Net -300 ml   Filed Weights   01/26/21 0628 01/26/21 1030 01/27/21 0658  Weight: (!) 145.5 kg (!) 136.9 kg (!) 141.8 kg   Physical  Examination  GENERAL:75 year-old critically ill Morbidly obese patient lying in the bed with no acute distress.  EYES: Pupils equal, round, reactive to light and accommodation. No  scleral icterus. Extraocular muscles intact.  HEENT: Head atraumatic, normocephalic. Oropharynx and nasopharynx clear.  NECK:  Supple, mild jugular venous distention. No thyroid enlargement, no tenderness.  LUNGS: Decreased breath sounds bilaterally, no wheezing, rales,rhonchi or crepitation. No use of accessory muscles of respiration.  CARDIOVASCULAR: S1, S2 normal.  Systolic murmurs, rubs, or gallops.  ABDOMEN: Soft, nontender, distended/anasarca. Bowel sounds present. No organomegaly or mass.  EXTREMITIES: Bilateral lower extremity pitting edema, cyanosis, or clubbing.  NEUROLOGIC: Cranial nerves II through XII are intact.  Muscle strength 5/5 in all extremities. Sensation intact. Gait not checked.  PSYCHIATRIC: The patient is alert and oriented x 3.  SKIN: No obvious rash, lesion, or ulcer.   Labs/imaging that I havepersonally reviewed  (right click and "Reselect all SmartList Selections" daily)     Labs   CBC: Recent Labs  Lab 01/27/21 0447  WBC 6.9  HGB 12.8*  HCT 37.0*  MCV 87.7  PLT 92*    Basic Metabolic Panel: Recent Labs  Lab 01/23/21 0518 01/24/21 0526 01/25/21 0543 01/26/21 0524 01/27/21 0447 01/27/21 1205  NA 135 133*  --  126* 126* 126*  K 3.7 4.1 4.1 3.7 3.5 4.0  CL 95* 89*  --  82* 84* 82*  CO2 32 34*  --  33* 32 32  GLUCOSE 126* 92  --  112* 102* 104*  BUN 26* 29*  --  37* 53* 59*  CREATININE 0.75 0.82 0.92 1.15 1.24 1.49*  CALCIUM 8.4* 8.9  --  8.3* 7.9* 8.0*  MG 2.4 2.1 1.8 2.0 2.2  --    GFR: Estimated Creatinine Clearance: 60 mL/min (A) (by C-G formula based on SCr of 1.49 mg/dL (H)). Recent Labs  Lab 01/27/21 0447  WBC 6.9    Liver Function Tests: No results for input(s): AST, ALT, ALKPHOS, BILITOT, PROT, ALBUMIN in the last 168 hours. No results for input(s): LIPASE, AMYLASE in the last 168 hours. No results for input(s): AMMONIA in the last 168 hours.  ABG No results found for: PHART, PCO2ART, PO2ART, HCO3, TCO2, ACIDBASEDEF, O2SAT    Coagulation Profile: No results for input(s): INR, PROTIME in the last 168 hours.  Cardiac Enzymes: No results for input(s): CKTOTAL, CKMB, CKMBINDEX, TROPONINI in the last 168 hours.  HbA1C: No results found for: HGBA1C  CBG: No results for input(s): GLUCAP in the last 168 hours.  Review of Systems:   Review of Systems  Constitutional: Negative.   HENT: Negative.    Eyes: Negative.   Respiratory:  Positive for cough and shortness of breath. Negative for hemoptysis, sputum production and wheezing.   Cardiovascular:  Positive for orthopnea, leg swelling and PND.  Gastrointestinal: Negative.   Genitourinary:  Positive for frequency and urgency. Negative for dysuria, flank pain and hematuria.  Musculoskeletal:  Positive for back pain and joint pain.  Skin: Negative.   Neurological: Negative.   Endo/Heme/Allergies:  Bruises/bleeds easily.  Psychiatric/Behavioral:  Positive for depression. The patient is nervous/anxious and has insomnia.     Past Medical History  He,  has a past medical history of Depression, Hypertension, and Hypothyroidism.   Surgical History    Past Surgical History:  Procedure Laterality Date   COLONOSCOPY WITH PROPOFOL N/A 03/03/2015   Procedure: COLONOSCOPY WITH PROPOFOL;  Surgeon: Manya Silvas, MD;  Location[facility] ENDOSCOPY;  Service: Endoscopy;  Laterality:  N/A;   EYE SURGERY       Social History   reports that he has quit smoking. He has quit using smokeless tobacco.  His smokeless tobacco use included chew. He reports that he does not drink alcohol.   Family History   His family history is not on file.   Allergies No Known Allergies   Home Medications  Prior to Admission medications   Medication Sig Start Date End Date Taking? Authorizing Provider  ketoconazole (NIZORAL) 2 % shampoo Apply topically. 03/18/20  Yes [provider]  mirtazapine (REMERON) 30 MG tablet Take 30 mg by mouth at bedtime. 01/15/21  Yes [provider]  polyethylene glycol powder (GLYCOLAX/MIRALAX) 17 GM/SCOOP powder Take as directed for colonoscopy prep. 01/14/15  Yes [provider]  traZODone (DESYREL) 100 MG tablet Take 1 tablet by mouth at bedtime. 01/02/21  Yes [provider]  triamcinolone (KENALOG) 0.1 % Apply topically. 01/08/20  Yes [provider]  Ascorbic Acid (VITAMIN C) 100 MG tablet Take 100 mg by mouth daily.    [provider]  aspirin 81 MG chewable tablet Chew by mouth.    [provider]  cholecalciferol (VITAMIN D) 400 UNITS TABS tablet Take 400 Units by mouth.    [provider]  Cyanocobalamin (VITAMIN B 12 PO) Take by mouth.    [provider]  folic acid (FOLVITE) 1 MG tablet Take 1 mg by mouth daily.    [provider]  furosemide (LASIX) 20 MG tablet Take 20 mg by mouth daily.    [provider]  levothyroxine (SYNTHROID, LEVOTHROID) 50 MCG tablet Take 50 mcg by mouth daily before breakfast.    [provider]  lisinopril (PRINIVIL,ZESTRIL) 40 MG tablet Take 40 mg by mouth daily.    [provider]  pravastatin (PRAVACHOL) 40 MG tablet Take 40 mg by mouth daily.    [provider]  Scheduled Meds:  aspirin  81 mg Oral Daily   Chlorhexidine Gluconate Cloth  6 each Topical Daily   diphenhydrAMINE  25 mg Oral QHS   enoxaparin (LOVENOX) injection  0.5 mg/kg Subcutaneous Q24H   levothyroxine  50 mcg Oral Q0600   melatonin  5 mg Oral QHS   pravastatin  40 mg Oral Daily   sodium chloride flush  3 mL Intravenous Q12H   sodium chloride flush  3 mL Intravenous Q12H   Continuous Infusions:  sodium chloride     amiodarone 60 mg/hr (01/28/21 0127)   amiodarone     milrinone 0.125 mcg/kg/min (01/27/21 1841)   norepinephrine (LEVOPHED) Adult infusion 2 mcg/min (01/28/21 0139)   PRN Meds:.sodium chloride, acetaminophen **OR** acetaminophen, magnesium hydroxide, ondansetron **OR** ondansetron (ZOFRAN)  IV, sodium chloride flush, traZODone  Active Hospital Problem list   Acute hypoxic respiratory failure Acute on chronic diastolic CHF AKI Hyponatremia Hypothyroidism Hyperlipidemia  Assessment & Plan:  Acute Hypoxic Respiratory Failure secondary to Acute Decompensated HFrEF  -Supplemental O2 as needed to maintain O2 saturations 88 to 92% -High risk for intubation -Follow intermittent ABG and chest x-ray as needed -IV Lasix as blood pressure and renal function permits; currently on Lasix 40 mg IV  -As needed bronchodilators   Acute on chronic HFrEF, newly reduced EF of 25-30% by Echo on 01/19/21 Cardiogenic Shock (Volume Overload, with Low-Output) Anasarca Hx: Hypertension, HLD status post Right and left heart catheterization with no significant CAD -Continuous cardiac monitoring -Diuretics: Furosemide 40 mg IV diureses >1L negative per day until approach euvolemia /  worsening renal function. -Inotropic Support: Continue milrinone for acute cardiogenic shock / low-output failure -Start Levophed with Goal MAP >65 mm Hg -Hold BP meds -Low salt diet  -Strict I&Os -Cardiology following, appreciate input  Frequent PVCs with episodes of Atrial runs -Start Amiodarone -Monitor electrolytes and replace as indicated -Serial EKG -TSH elevated  9.059, Normal T3 and slightly elevated T4, will treat underlying thyroid dysfunction   AKI -Monitor I&O's / urinary output -Follow BMP -Ensure adequate renal perfusion -Avoid nephrotoxic agents as able -Replace electrolytes as indicated  Hypotonic Hyponatremia Hypervolemic likely in the setting of volume overload as above -Check cortisol, urinary sodium, serum osmolality, sodium osmolality -Monitor worsening Na while on Diuretics -Water restriction -Check serial sodium -Strict I&Os  Hypothyroidism Known hx of hypothyroidism on synthroid -TSH elevated  9.059, Normal T3 and slightly elevated T4 -increase Synthroid to 75 mcg and  recheck   Best practice:  Diet:  Oral Pain/Anxiety/Delirium protocol (if indicated): No VAP protocol (if indicated): Not indicated DVT prophylaxis: LMWH GI prophylaxis: H2B Glucose control:  SSI No Central venous access:  Yes, and it is still needed Arterial line:  N/A Foley:  N/A Mobility:  bed rest  PT consulted: N/A Last date of multidisciplinary goals of care discussion [10/18] Code Status:  full code Disposition: STEP DOWN   = Goals of Care = Code Status Order: FULL  Primary Emergency ContactSpurgeon, Gancarz, Home Phone: [phone] Wishes to pursue full aggressive treatment and intervention options, including CPR and intubation, but goals of care will be addressed on going with family if that should become necessary.   Critical care time: 45 minutes     Rufina Falco, DNP, CCRN, FNP-C, AGACNP-BC Acute Care Nurse Practitioner  [facility] Pager: [phone] [place] [facility]  .

## 2021-01-27 NOTE — Progress Notes (Addendum)
Pt admitted to ICU near 1700. Upon arrival, pt on Milrinone gtt. Between 1700-1800, pt's BP borderline, SBP low 80s & sometimes in upper 70s. Dr. Kurtis Bushman notified via chat regarding borderline BP. No response received, so message sent to Dr. Saunders Revel. Per Dr. Saunders Revel, cut milrinone rate in half to .125     Pt remains on 2L [place]. No complaints of pain. External purewick attached.   Pt in no acute distress @ this time. Will continue to monitor.

## 2021-01-27 NOTE — H&P (View-Only) (Signed)
Progress Note  Patient Name: Anthony Weber Date of Encounter: 01/27/2021  Primary Cardiologist:Dr. Rockey Situ  Subjective   No CP. Continues to report that he is breathing well. Denies any abdominal distention or firmness. States he is eager for catheterization.  He did not sleep well last night.  Inpatient Medications    Scheduled Meds:  aspirin  81 mg Oral Daily   carvedilol  6.25 mg Oral BID WC   diphenhydrAMINE  25 mg Oral QHS   enoxaparin (LOVENOX) injection  0.5 mg/kg Subcutaneous Q24H   levothyroxine  50 mcg Oral Q0600   losartan  50 mg Oral Daily   melatonin  5 mg Oral QHS   pravastatin  40 mg Oral Daily   sodium chloride flush  3 mL Intravenous Q12H   Continuous Infusions:  sodium chloride     sodium chloride 75 mL/hr at 01/27/21 0750   PRN Meds: sodium chloride, acetaminophen **OR** acetaminophen, magnesium hydroxide, ondansetron **OR** ondansetron (ZOFRAN) IV, sodium chloride flush, traZODone   Vital Signs    Vitals:   01/27/21 0004 01/27/21 0327 01/27/21 0658 01/27/21 0736  BP: (!) 86/61 (!) 127/100  (!) 108/59  Pulse: 82 79  75  Resp: 16 16  16   Temp: 98.2 F (36.8 C) 97.8 F (36.6 C)  98.5 F (36.9 C)  TempSrc: Oral Oral  Oral  SpO2: 97% 100%  (!) 86%  Weight:   (!) 141.8 kg   Height:        Intake/Output Summary (Last 24 hours) at 01/27/2021 0911 Last data filed at 01/27/2021 5621 Gross per 24 hour  Intake 540 ml  Output 1200 ml  Net -660 ml    Last 3 Weights 01/27/2021 01/26/2021 01/26/2021  Weight (lbs) 312 lb 9.6 oz 301 lb 13 oz 320 lb 12.3 oz  Weight (kg) 141.794 kg 136.9 kg 145.5 kg      Telemetry    SR, frequent PVCs, 80-100s - Personally Reviewed  ECG    No new tracings - Personally Reviewed  Physical Exam   GEN: No acute distress.  Joined by his wife Neck: JVD difficult to assess due to body habitus Cardiac: RRR, no murmurs, rubs, or gallops.  Respiratory: trace bibasilar crackles. GI: Improved from earlier  distention/firmness, yet still somewhat firm MS: Bilateral moderate to 1+ LEE; No deformity. Neuro:  Nonfocal  Psych: Normal affect   Labs    High Sensitivity Troponin:   Recent Labs  Lab 01/18/21 1654 01/19/21 1238  TROPONINIHS 20* 22*       Chemistry Recent Labs  Lab 01/24/21 0526 01/25/21 0543 01/26/21 0524 01/27/21 0447  NA 133*  --  126* 126*  K 4.1 4.1 3.7 3.5  CL 89*  --  82* 84*  CO2 34*  --  33* 32  GLUCOSE 92  --  112* 102*  BUN 29*  --  37* 53*  CREATININE 0.82 0.92 1.15 1.24  CALCIUM 8.9  --  8.3* 7.9*  GFRNONAA >60 >60 >60 >60  ANIONGAP 10  --  11 10      Hematology Recent Labs  Lab 01/27/21 0447  WBC 6.9  RBC 4.22  HGB 12.8*  HCT 37.0*  MCV 87.7  MCH 30.3  MCHC 34.6  RDW 14.5  PLT 92*    BNP Recent Labs  Lab 01/27/21 0447  BNP 681.0*      DDimer No results for input(s): DDIMER in the last 168 hours.   Radiology    No results found.  Cardiac Studies   Echo 01/19/2021  1. Left ventricular ejection fraction, by estimation, is 25 to 30%. The  left ventricle has severely decreased function. Left ventricular  endocardial border not optimally defined to evaluate regional wall motion.  Left ventricular diastolic parameters are   indeterminate.   2. Right ventricular systolic function was not well visualized. The right  ventricular size is normal. Tricuspid regurgitation signal is inadequate  for assessing PA pressure.   3. The mitral valve was not well visualized. No evidence of mitral valve  regurgitation. No evidence of mitral stenosis.   4. The aortic valve was not well visualized. Aortic valve regurgitation  is not visualized. No aortic stenosis is present.   5. Technically difficult study due to poor echo windows.      Very limited study even with contrast agent. Consider alternative  testing.    Echo 04/18/2020  1. Left ventricular ejection fraction, by estimation, is 50 to 55%. The  left ventricle has low normal  function. The left ventricle grossly has no  regional wall motion abnormalities. Left ventricular diastolic parameters  are consistent with Grade II  diastolic dysfunction (pseudonormalization).   2. Right ventricular systolic function is normal. The right ventricular  size is normal. Tricuspid regurgitation signal is inadequate for assessing  PA pressure.   3. Left atrial size was moderately dilated.   4. Challenging images    MPI 03/2020 Pharmacological myocardial perfusion imaging study with no significant ischemia Small region of apical thinning, likely secondary to attenuation artifact Global hypokinesis, EF estimated at 25% No EKG changes concerning for ischemia at peak stress or in recovery. PVCs noted postinfusion CT attenuation correction images with mild aortic atherosclerosis in the arch, coronary calcification Moderate risk scan, consider confirmation of ejection fraction with echocardiogram  Patient Profile     75 y.o. male with history of newly reduced EF 25 to 30%, hypertension, obesity, hyperlipidemia, prior pneumonia in 2013, and who is being seen today for the evaluation of exacerbation of heart failure with echo showing EF reduced from previous 50 to 55% to 01/19/2021 EF 25 to 30% and recommendation for right left heart catheterization for further ischemic work-up and better understanding of hemodynamics.  Assessment & Plan    Acute on chronic HFrEF, newly reduced EF --Improved breathing status from admit date. Still volume up on exam. Echo EF 50 to 55%  EF 25-30%.  Renal function bumped overnight with Cr 1.15  1.24 and BUN 37  53], despite holding IV lasix yesterday.  R/LHC initially scheduled for this morning but postponed to later this afternoon given bump in renal function.  He has been started gentle hydration with plan for repeat BMET at 12 PM today.  If renal function improves after hydration, we will proceed with [facility] later this afternoon.   Hold IV lasix   Gentle IV hydration at 75cc/hr Recheck BMET at 12PM today Monitor I/os, daily weights.  Output -11.1 for admission /cumulative net -5.6.  Wt 136.9kg  141.8kg. Suspect wts may not be accurate. Remain NPO for possible R/LHC later today, pending Cr. Possible transition from Losartan to [facility] before discharge if Cr allows  Continue Coreg 6.25 mg BID/ Future addition of spironolactone / SGLT2i in the OP setting if Cr allows   Essential hypertension BP labile. Continue Coreg, losartan.  Possible transition from losartan to [facility] before discharge if renal function allows.     PVCs Frequent PVCs.  Asymptomatic.  Continue Coreg.  HLD LDL 51.  Continue statin.  Obesity Lifestyle changes and weight loss advised.  Abnormal TSH Recheck thyroid panel given PVCs and labile pressure.   For questions or updates, please contact [place] Please consult [email or web address] for contact info under        Signed, Arvil Chaco, PA-C  01/27/2021, 9:11 AM

## 2021-01-27 NOTE — Progress Notes (Signed)
Progress Note  Patient Name: Anthony Weber Date of Encounter: 01/27/2021  Primary Cardiologist:Dr. Rockey Situ  Subjective   No CP. Continues to report that he is breathing well. Denies any abdominal distention or firmness. States he is eager for catheterization.  He did not sleep well last night.  Inpatient Medications    Scheduled Meds:  aspirin  81 mg Oral Daily   carvedilol  6.25 mg Oral BID WC   diphenhydrAMINE  25 mg Oral QHS   enoxaparin (LOVENOX) injection  0.5 mg/kg Subcutaneous Q24H   levothyroxine  50 mcg Oral Q0600   losartan  50 mg Oral Daily   melatonin  5 mg Oral QHS   pravastatin  40 mg Oral Daily   sodium chloride flush  3 mL Intravenous Q12H   Continuous Infusions:  sodium chloride     sodium chloride 75 mL/hr at 01/27/21 0750   PRN Meds: sodium chloride, acetaminophen **OR** acetaminophen, magnesium hydroxide, ondansetron **OR** ondansetron (ZOFRAN) IV, sodium chloride flush, traZODone   Vital Signs    Vitals:   01/27/21 0004 01/27/21 0327 01/27/21 0658 01/27/21 0736  BP: (!) 86/61 (!) 127/100  (!) 108/59  Pulse: 82 79  75  Resp: 16 16  16   Temp: 98.2 F (36.8 C) 97.8 F (36.6 C)  98.5 F (36.9 C)  TempSrc: Oral Oral  Oral  SpO2: 97% 100%  (!) 86%  Weight:   (!) 141.8 kg   Height:        Intake/Output Summary (Last 24 hours) at 01/27/2021 0911 Last data filed at 01/27/2021 5188 Gross per 24 hour  Intake 540 ml  Output 1200 ml  Net -660 ml    Last 3 Weights 01/27/2021 01/26/2021 01/26/2021  Weight (lbs) 312 lb 9.6 oz 301 lb 13 oz 320 lb 12.3 oz  Weight (kg) 141.794 kg 136.9 kg 145.5 kg      Telemetry    SR, frequent PVCs, 80-100s - Personally Reviewed  ECG    No new tracings - Personally Reviewed  Physical Exam   GEN: No acute distress.  Joined by his wife Neck: JVD difficult to assess due to body habitus Cardiac: RRR, no murmurs, rubs, or gallops.  Respiratory: trace bibasilar crackles. GI: Improved from earlier  distention/firmness, yet still somewhat firm MS: Bilateral moderate to 1+ LEE; No deformity. Neuro:  Nonfocal  Psych: Normal affect   Labs    High Sensitivity Troponin:   Recent Labs  Lab 01/18/21 1654 01/19/21 1238  TROPONINIHS 20* 22*       Chemistry Recent Labs  Lab 01/24/21 0526 01/25/21 0543 01/26/21 0524 01/27/21 0447  NA 133*  --  126* 126*  K 4.1 4.1 3.7 3.5  CL 89*  --  82* 84*  CO2 34*  --  33* 32  GLUCOSE 92  --  112* 102*  BUN 29*  --  37* 53*  CREATININE 0.82 0.92 1.15 1.24  CALCIUM 8.9  --  8.3* 7.9*  GFRNONAA >60 >60 >60 >60  ANIONGAP 10  --  11 10      Hematology Recent Labs  Lab 01/27/21 0447  WBC 6.9  RBC 4.22  HGB 12.8*  HCT 37.0*  MCV 87.7  MCH 30.3  MCHC 34.6  RDW 14.5  PLT 92*    BNP Recent Labs  Lab 01/27/21 0447  BNP 681.0*      DDimer No results for input(s): DDIMER in the last 168 hours.   Radiology    No results found.  Cardiac Studies   Echo 01/19/2021  1. Left ventricular ejection fraction, by estimation, is 25 to 30%. The  left ventricle has severely decreased function. Left ventricular  endocardial border not optimally defined to evaluate regional wall motion.  Left ventricular diastolic parameters are   indeterminate.   2. Right ventricular systolic function was not well visualized. The right  ventricular size is normal. Tricuspid regurgitation signal is inadequate  for assessing PA pressure.   3. The mitral valve was not well visualized. No evidence of mitral valve  regurgitation. No evidence of mitral stenosis.   4. The aortic valve was not well visualized. Aortic valve regurgitation  is not visualized. No aortic stenosis is present.   5. Technically difficult study due to poor echo windows.      Very limited study even with contrast agent. Consider alternative  testing.    Echo 04/18/2020  1. Left ventricular ejection fraction, by estimation, is 50 to 55%. The  left ventricle has low normal  function. The left ventricle grossly has no  regional wall motion abnormalities. Left ventricular diastolic parameters  are consistent with Grade II  diastolic dysfunction (pseudonormalization).   2. Right ventricular systolic function is normal. The right ventricular  size is normal. Tricuspid regurgitation signal is inadequate for assessing  PA pressure.   3. Left atrial size was moderately dilated.   4. Challenging images    MPI 03/2020 Pharmacological myocardial perfusion imaging study with no significant ischemia Small region of apical thinning, likely secondary to attenuation artifact Global hypokinesis, EF estimated at 25% No EKG changes concerning for ischemia at peak stress or in recovery. PVCs noted postinfusion CT attenuation correction images with mild aortic atherosclerosis in the arch, coronary calcification Moderate risk scan, consider confirmation of ejection fraction with echocardiogram  Patient Profile     75 y.o. male with history of newly reduced EF 25 to 30%, hypertension, obesity, hyperlipidemia, prior pneumonia in 2013, and who is being seen today for the evaluation of exacerbation of heart failure with echo showing EF reduced from previous 50 to 55% to 01/19/2021 EF 25 to 30% and recommendation for right left heart catheterization for further ischemic work-up and better understanding of hemodynamics.  Assessment & Plan    Acute on chronic HFrEF, newly reduced EF --Improved breathing status from admit date. Still volume up on exam. Echo EF 50 to 55%  EF 25-30%.  Renal function bumped overnight with Cr 1.15  1.24 and BUN 37  53], despite holding IV lasix yesterday.  R/LHC initially scheduled for this morning but postponed to later this afternoon given bump in renal function.  He has been started gentle hydration with plan for repeat BMET at 12 PM today.  If renal function improves after hydration, we will proceed with [facility] later this afternoon.   Hold IV lasix   Gentle IV hydration at 75cc/hr Recheck BMET at 12PM today Monitor I/os, daily weights.  Output -11.1 for admission /cumulative net -5.6.  Wt 136.9kg  141.8kg. Suspect wts may not be accurate. Remain NPO for possible R/LHC later today, pending Cr. Possible transition from Losartan to [facility] before discharge if Cr allows  Continue Coreg 6.25 mg BID/ Future addition of spironolactone / SGLT2i in the OP setting if Cr allows   Essential hypertension BP labile. Continue Coreg, losartan.  Possible transition from losartan to [facility] before discharge if renal function allows.     PVCs Frequent PVCs.  Asymptomatic.  Continue Coreg.  HLD LDL 51.  Continue statin.  Obesity Lifestyle changes and weight loss advised.  Abnormal TSH Recheck thyroid panel given PVCs and labile pressure.   For questions or updates, please contact [place] Please consult [email or web address] for contact info under        Signed, Arvil Chaco, PA-C  01/27/2021, 9:11 AM

## 2021-01-27 NOTE — Progress Notes (Signed)
OT Cancellation Note  Patient Details Name: Anthony Weber MRN: [id number] DOB: [birth year]   Cancelled Treatment:    Reason Eval/Treat Not Completed: Patient at procedure or test/ unavailable. Pt at heart cath today. Will resume when medically ready.  Nino Glow, OTS   Nino Glow 01/27/2021, 12:59 PM

## 2021-01-27 NOTE — Progress Notes (Signed)
PICC consent obtained from spouse. Able to cannulate L cephalic easily. PICC threaded easily to center of chest, but turned toward R brachiocephalic repeatedly. Unable to use R arm as the R radial artery was used for the catherization earlier today and no tourniquet can be used on that arm presently. Nurse Practioner now in room to attempt CVC.

## 2021-01-27 NOTE — Progress Notes (Signed)
Spoke with Lilia Argue, RN concerning PICC placement this evening. Patient with 2 PIVs and Milrinone infusing. Plan to place PICC this evening after 7 pm. Bedside nurse agreeable with plan. Will continue to monitor.

## 2021-01-27 NOTE — Progress Notes (Signed)
PT Cancellation Note  Patient Details Name: Anthony Weber MRN: [id number] DOB: 07-25-1945   Cancelled Treatment:    Reason Eval/Treat Not Completed: Patient at procedure or test/unavailable.  Pt undergoing heart cath today.  Will hold until medically cleared for therapy.    Gwenlyn Saran, PT, DPT 01/27/21, 12:53 PM

## 2021-01-28 ENCOUNTER — Ambulatory Visit: Payer: Medicare HMO | Admitting: Family

## 2021-01-28 ENCOUNTER — Encounter: Payer: Self-pay | Admitting: Internal Medicine

## 2021-01-28 DIAGNOSIS — I5033 Acute on chronic diastolic (congestive) heart failure: Secondary | ICD-10-CM | POA: Diagnosis not present

## 2021-01-28 LAB — BASIC METABOLIC PANEL
Anion gap: 11 (ref 5–15)
BUN: 69 mg/dL — ABNORMAL HIGH (ref 8–23)
CO2: 34 mmol/L — ABNORMAL HIGH (ref 22–32)
Calcium: 8.1 mg/dL — ABNORMAL LOW (ref 8.9–10.3)
Chloride: 81 mmol/L — ABNORMAL LOW (ref 98–111)
Creatinine, Ser: 1.48 mg/dL — ABNORMAL HIGH (ref 0.61–1.24)
GFR, Estimated: 49 mL/min — ABNORMAL LOW (ref >60)
Glucose, Bld: 172 mg/dL — ABNORMAL HIGH (ref 70–99)
Potassium: 3.8 mmol/L (ref 3.5–5.1)
Sodium: 126 mmol/L — ABNORMAL LOW (ref 135–145)

## 2021-01-28 LAB — BLOOD GAS, VENOUS
Acid-Base Excess: 9.4 mmol/L — ABNORMAL HIGH (ref 0.0–2.0)
Acid-Base Excess: 12.2 mmol/L — ABNORMAL HIGH (ref 0.0–2.0)
Bicarbonate: 35.7 mmol/L — ABNORMAL HIGH (ref 20.0–28.0)
Bicarbonate: 37.8 mmol/L — ABNORMAL HIGH (ref 20.0–28.0)
FIO2: 32
O2 Saturation: 95.1 %
O2 Saturation: 86.8 %
Patient temperature: 37
Patient temperature: 37
pCO2, Ven: 55 mmHg (ref 44.0–60.0)
pCO2, Ven: 52 mmHg (ref 44.0–60.0)
pH, Ven: 7.42 (ref 7.250–7.430)
pH, Ven: 7.47 — ABNORMAL HIGH (ref 7.250–7.430)
pO2, Ven: 75 mmHg — ABNORMAL HIGH (ref 32.0–45.0)
pO2, Ven: 49 mmHg — ABNORMAL HIGH (ref 32.0–45.0)

## 2021-01-28 LAB — THYROID PANEL WITH TSH
Free Thyroxine Index: 1.8 (ref 1.2–4.9)
T3 Uptake Ratio: 33 % (ref 24–39)
T4, Total: 5.5 ug/dL (ref 4.5–12.0)
TSH: 4.61 u[IU]/mL — ABNORMAL HIGH (ref 0.450–4.500)

## 2021-01-28 LAB — MAGNESIUM: Magnesium: 2.3 mg/dL (ref 1.7–2.4)

## 2021-01-28 LAB — LACTIC ACID, PLASMA: Lactic Acid, Venous: 1.3 mmol/L (ref 0.5–1.9)

## 2021-01-28 LAB — COOXEMETRY PANEL
Carboxyhemoglobin: 1.6 % — ABNORMAL HIGH (ref 0.5–1.5)
Methemoglobin: 1 % (ref 0.0–1.5)
O2 Saturation: 83.4 %
Total oxygen content: 76.6 mL/dL

## 2021-01-28 LAB — CORTISOL-AM, BLOOD: Cortisol - AM: 60.4 ug/dL — ABNORMAL HIGH (ref 6.7–22.6)

## 2021-01-28 LAB — SODIUM: Sodium: 124 mmol/L — ABNORMAL LOW (ref 135–145)

## 2021-01-28 LAB — OSMOLALITY: Osmolality: 291 mOsm/kg (ref 275–295)

## 2021-01-28 MED ORDER — AMIODARONE HCL IN DEXTROSE 360-4.14 MG/200ML-% IV SOLN
60.0000 mg/h | INTRAVENOUS | Status: AC
  Administered 2021-01-28: 60 mg/h via INTRAVENOUS
  Filled 2021-01-28: qty 200

## 2021-01-28 MED ORDER — NOREPINEPHRINE 4 MG/250ML-% IV SOLN
0.0000 ug/min | INTRAVENOUS | Status: DC
  Administered 2021-01-28: 6 ug/min via INTRAVENOUS
  Administered 2021-01-28: 2 ug/min via INTRAVENOUS
  Administered 2021-01-29 (×2): 8 ug/min via INTRAVENOUS
  Filled 2021-01-28 (×4): qty 250

## 2021-01-28 MED ORDER — AMIODARONE HCL IN DEXTROSE 360-4.14 MG/200ML-% IV SOLN: 30.0000 mg/h | INTRAVENOUS | Status: DC

## 2021-01-28 MED ORDER — LEVOTHYROXINE SODIUM 50 MCG PO TABS
75.0000 ug | ORAL_TABLET | Freq: Every day | ORAL | Status: DC
  Administered 2021-01-28 – 2021-01-29 (×2): 75 ug via ORAL
  Filled 2021-01-28 (×2): qty 2

## 2021-01-28 NOTE — Progress Notes (Signed)
OT Cancellation Note  Patient Details Name: Anthony Weber MRN: [id number] DOB: 11/30/1945   Cancelled Treatment:    Reason Eval/Treat Not Completed: Medical issues which prohibited therapy. Pt with change in medical status and transferred to CCU. Per therapy protocol, OT to complete orders and await new orders when pt is able to medically participate in therapeutic intervention.   Darleen Crocker, MS, OTR/L , CBIS ascom [phone]  01/28/21, 8:03 AM

## 2021-01-28 NOTE — Progress Notes (Signed)
Pt having lots of PVCs and runs of vtach overnight. EKG done. Provider notified. Labs drawn to check electrolytes. No replacements needed. Blood pressure low with SBP in 80s and map less than 65, Levophed started. Also started Amiodarone for frequent vtach. In AM, pt began to drop HR into mid 40s, notified provider. Amiodarone rate decreases to 30mg /hr. Pt is arousable, alert and oriented x3 at this time. Pt disoriented to place. Pt still have frequent ectopy on monitor. If bradycardia persist, provider wants amiodarone discontinued. Will continue to monitor  pt's condition closely

## 2021-01-28 NOTE — Progress Notes (Signed)
Pt HR down to mid 30s, non sustaining, amiodarone stopped.

## 2021-01-28 NOTE — Progress Notes (Addendum)
Progress Note  Patient Name: Anthony Weber Date of Encounter: 01/28/2021  [facility] Cardiologist: Dr. Rockey Situ  Subjective   No acute events overnight, milrinone decreased due to significant ectopy.  Amiodarone stopped due to low blood pressures.  Currently on Levophed and milrinone.  Wife at bedside.  Patient extremely somnolent.  Inpatient Medications    Scheduled Meds:  aspirin  81 mg Oral Daily   Chlorhexidine Gluconate Cloth  6 each Topical Daily   diphenhydrAMINE  25 mg Oral QHS   enoxaparin (LOVENOX) injection  0.5 mg/kg Subcutaneous Q24H   levothyroxine  75 mcg Oral Q0600   melatonin  5 mg Oral QHS   pravastatin  40 mg Oral Daily   sodium chloride flush  3 mL Intravenous Q12H   sodium chloride flush  3 mL Intravenous Q12H   Continuous Infusions:  sodium chloride     amiodarone Stopped (01/28/21 0459)   milrinone 0.125 mcg/kg/min (01/28/21 0700)   norepinephrine (LEVOPHED) Adult infusion 6 mcg/min (01/28/21 1418)   PRN Meds: sodium chloride, acetaminophen **OR** acetaminophen, magnesium hydroxide, ondansetron **OR** ondansetron (ZOFRAN) IV, sodium chloride flush, traZODone   Vital Signs    Vitals:   01/28/21 1400 01/28/21 1415 01/28/21 1430 01/28/21 1445  BP: (!) 107/42 (!) 90/48 (!) 89/43 (!) 103/55  Pulse: 95 79 63 (!) 54  Resp: (!) 22 19 17  (!) 22  Temp:      TempSrc:      SpO2: 92% 94% 95% 96%  Weight:      Height:        Intake/Output Summary (Last 24 hours) at 01/28/2021 1619 Last data filed at 01/28/2021 0930 Gross per 24 hour  Intake 1006.63 ml  Output 450 ml  Net 556.63 ml   Last 3 Weights 01/27/2021 01/26/2021 01/26/2021  Weight (lbs) 312 lb 9.6 oz 301 lb 13 oz 320 lb 12.3 oz  Weight (kg) 141.794 kg 136.9 kg 145.5 kg      Telemetry    Sinus rhythm, PVCs- Personally Reviewed  ECG     - Personally Reviewed  Physical Exam   GEN: Somnolent Neck: No JVD Cardiac: RRR, no murmurs, rubs, or gallops.  Respiratory:  Diminished breath sounds at bases GI: Soft, nontender, distended  MS: 2+ edema; feet appear warm Neuro:  Nonfocal  Psych: Normal affect   Labs    High Sensitivity Troponin:   Recent Labs  Lab 01/18/21 1654 01/19/21 1238  TROPONINIHS 20* 22*     Chemistry Recent Labs  Lab 01/27/21 0447 01/27/21 1205 01/27/21 2242 01/28/21 0416  NA 126* 126* 127* 126*  K 3.5 4.0 4.0 3.8  CL 84* 82* 81* 81*  CO2 32 32 35* 34*  GLUCOSE 102* 104* 142* 172*  BUN 53* 59* 66* 69*  CREATININE 1.24 1.49* 1.38* 1.48*  CALCIUM 7.9* 8.0* 8.1* 8.1*  MG 2.2  --  2.5* 2.3  GFRNONAA >60 48* 53* 49*  ANIONGAP 10 12 11 11     Lipids No results for input(s): CHOL, TRIG, HDL, LABVLDL, LDLCALC, CHOLHDL in the last 168 hours.  Hematology Recent Labs  Lab 01/27/21 0447  WBC 6.9  RBC 4.22  HGB 12.8*  HCT 37.0*  MCV 87.7  MCH 30.3  MCHC 34.6  RDW 14.5  PLT 92*   Thyroid  Recent Labs  Lab 01/27/21 1205  TSH 4.610*    BNP Recent Labs  Lab 01/27/21 0447  BNP 681.0*    DDimer No results for input(s): DDIMER in the last 168 hours.  Radiology    DG Chest 1 View  Result Date: 01/27/2021 CLINICAL DATA:  Central line placement EXAM: CHEST  1 VIEW COMPARISON:  01/18/2021 FINDINGS: Interval placement of right IJ central venous catheter with tip over the SVC. No visible right pneumothorax. Cardiomegaly with vascular congestion. Probable small left effusion with left basilar airspace disease. IMPRESSION: 1. Right IJ central venous catheter tip over the SVC. No pneumothorax 2. Cardiomegaly with mild central congestion 3. Possible small left effusion with mild airspace disease at left base. Electronically Signed   By: Donavan Foil M.D.   On: 01/27/2021 22:00   CARDIAC CATHETERIZATION  Result Date: 01/27/2021 Conclusions: No angiographically significant coronary artery disease. Moderately elevated left heart and pulmonary artery pressures. Severely elevated right heart filling pressure. Severely  reduced Fick cardiac output/index. Recommendations: Transfer to stepdown for initiation of milrinone infusion in the setting of low output heart failure. Decrease carvedilol and losartan with initiation of milrinone and borderline hypotension. Diurese as renal function tolerates following initiation of milrinone. Primary prevention of coronary artery disease. Nelva Bush, MD [facility]  [place] EKG SITE RITE  Result Date: 01/27/2021 If Site Rite image not attached, placement could not be confirmed due to current cardiac rhythm.   Cardiac Studies   Echo 01/19/2021  1. Left ventricular ejection fraction, by estimation, is 25 to 30%. The  left ventricle has severely decreased function. Left ventricular  endocardial border not optimally defined to evaluate regional wall motion.  Left ventricular diastolic parameters are   indeterminate.   2. Right ventricular systolic function was not well visualized. The right  ventricular size is normal. Tricuspid regurgitation signal is inadequate  for assessing PA pressure.   3. The mitral valve was not well visualized. No evidence of mitral valve  regurgitation. No evidence of mitral stenosis.   4. The aortic valve was not well visualized. Aortic valve regurgitation  is not visualized. No aortic stenosis is present.   5. Technically difficult study due to poor echo windows.      Very limited study even with contrast agent. Consider alternative  testing.  Patient Profile     75 y.o. male with history of hypertension, hyperlipidemia, obesity presenting with shortness of breath.  Diagnosed with severely reduced ejection fraction, left heart cath with no obstructive CAD.  Being seen for acute heart failure requiring pressors.  Assessment & Plan    NICM, EF 25 to 30% -Volume overload, low BP preventing adequate diuresis -Requiring Levophed and milrinone for BP support -Titrate pressors as needed to keep MAP greater than 60 -Low threshold for  transfer to [facility]/heart failure service.  2.  Hyperlipidemia -Pravachol   Prognosis remains guarded  Total encounter time 35 minutes  Greater than 50% was spent in counseling and coordination of care with the patient     Signed, Kate Sable, MD  01/28/2021, 4:19 PM

## 2021-01-28 NOTE — Progress Notes (Signed)
Pt remains on Levo and Milirinone gtt's. No changes made to rates.   Pt remains on 2L .   Frequent PVCs and occasional runs of Vtach (Primarily in AM).   Pt resting this evening. Eating adequately.

## 2021-01-28 NOTE — Progress Notes (Signed)
PT Cancellation Note  Patient Details Name: Deshawn Witty MRN: [id number] DOB: 06-08-1945   Cancelled Treatment:    Reason Eval/Treat Not Completed: Medical issues which prohibited therapy.  Pt transferred to higher level of care, will sign off.  Please reconsult when pt is medically appropriate for therapy.   Gwenlyn Saran, PT, DPT 01/28/21, 12:57 PM

## 2021-01-28 NOTE — Progress Notes (Signed)
CHIEF COMPLAINT:   Acute sCHF   Subjective  Severe decompensated sCHF Cardiogenic shock On pressors and milrinone Lethargic High risk for aspiration and intubation Mor[place]ildy obese  EVENTS 10/18 ICU admission for advanced CHF care  REVIEW OF SYSTEMS  PATIENT IS UNABLE TO PROVIDE COMPLETE REVIEW OF SYSTEMS DUE TO TOXIC METABOLIC ENCEPHALOPATHY     Objective    PHYSICAL EXAMINATION:  GENERAL:critically ill appearing, +resp distress EYES: Pupils equal, round, reactive to light.  No scleral icterus.  MOUTH: Moist mucosal membrane.  NECK: Supple.  PULMONARY: +rhonchi,  CARDIOVASCULAR: S1 and S2.  No murmurs  GASTROINTESTINAL: Soft, nontender, -distended. Positive bowel sounds.  MUSCULOSKELETAL: +edema.  NEUROLOGIC: lethargic SKIN:intact,warm,dry    VITALS:  height is 5\' 10"  (1.778 m) and weight is 141.8 kg (abnormal). His oral temperature is 98.4 F (36.9 C). His blood pressure is 95/46 (abnormal) and his pulse is 74. His respiration is 19 and oxygen saturation is 96%.   I personally reviewed Labs under Results section.  Radiology Reports DG Chest 1 View  Result Date: 01/27/2021 CLINICAL DATA:  Central line placement EXAM: CHEST  1 VIEW COMPARISON:  01/18/2021 FINDINGS: Interval placement of right IJ central venous catheter with tip over the SVC. No visible right pneumothorax. Cardiomegaly with vascular congestion. Probable small left effusion with left basilar airspace disease. IMPRESSION: 1. Right IJ central venous catheter tip over the SVC. No pneumothorax 2. Cardiomegaly with mild central congestion 3. Possible small left effusion with mild airspace disease at left base. Electronically Signed   By: Donavan Foil M.D.   On: 01/27/2021 22:00   CARDIAC CATHETERIZATION  Result Date: 01/27/2021 Conclusions: No angiographically significant coronary artery disease. Moderately elevated left heart and pulmonary artery pressures. Severely elevated right heart filling  pressure. Severely reduced Fick cardiac output/index. Recommendations: Transfer to stepdown for initiation of milrinone infusion in the setting of low output heart failure. Decrease carvedilol and losartan with initiation of milrinone and borderline hypotension. Diurese as renal function tolerates following initiation of milrinone. Primary prevention of coronary artery disease. Nelva Bush, MD [facility]  [place] EKG SITE RITE  Result Date: 01/27/2021 If Site Rite image not attached, placement could not be confirmed due to current cardiac rhythm.      Assessment/Plan:   SEVERE DECOMPENSATED SYSTOLIC HEART FAILURE WITH CARDIOGENIC SHOCK WITH PROGRESSIVE CARDIORENAL FAILURE  ACUTE SYSTOLIC CARDIAC FAILURE- EF 20% -oxygen as needed -follow up cardiology recs On pressors and milrinone infusion Check mixed VBG  ACUTE KIDNEY INJURY/Renal Failure -continue Foley Catheter-assess need -Avoid nephrotoxic agents -Follow urine output, BMP -Ensure adequate renal perfusion, optimize oxygenation -Renal dose medications   Intake/Output Summary (Last 24 hours) at 01/28/2021 0735 Last data filed at 01/28/2021 0500 Gross per 24 hour  Intake 483 ml  Output 450 ml  Net 33 ml    ELECTROLYTES -follow labs as needed -replace as needed -pharmacy consultation and following   NEUROLOGY ACUTE TOXIC METABOLIC ENCEPHALOPATHY Avoid sedatives      DVT/GI PRX  assessed I Assessed the need for Labs I Assessed the need for Foley I Assessed the need for Central Venous Line Family Discussion when available I Assessed the need for Mobilization I made an Assessment of medications to be adjusted accordingly Safety Risk assessment completed  CASE DISCUSSED IN MULTIDISCIPLINARY ROUNDS WITH ICU TEAM     Critical Care Time devoted to patient care services described in this note is 45  minutes.  Critical care was necessary to treat /prevent imminent and life-threatening deterioration. Overall,  patient is critically ill, prognosis is guarded.  Patient with Multiorgan failure and at high risk for cardiac arrest and death.    Corrin Parker, M.D.  [facility]  Medical Director [place] Director [facility] Cardio-Pulmonary Department

## 2021-01-29 ENCOUNTER — Inpatient Hospital Stay (HOSPITAL_COMMUNITY)
Admission: AD | Admit: 2021-01-29 | Discharge: 2021-02-11 | DRG: 291 | Disposition: A | Discharge status: Home / Self Care | Payer: Medicare HMO | Source: Other Acute Inpatient Hospital | Attending: Internal Medicine | Admitting: Internal Medicine

## 2021-01-29 ENCOUNTER — Inpatient Hospital Stay: Payer: Medicare HMO

## 2021-01-29 DIAGNOSIS — J159 Unspecified bacterial pneumonia: Secondary | ICD-10-CM | POA: Diagnosis present

## 2021-01-29 DIAGNOSIS — R7881 Bacteremia: Secondary | ICD-10-CM | POA: Diagnosis not present

## 2021-01-29 DIAGNOSIS — E871 Hypo-osmolality and hyponatremia: Secondary | ICD-10-CM

## 2021-01-29 DIAGNOSIS — F32A Depression, unspecified: Secondary | ICD-10-CM | POA: Diagnosis present

## 2021-01-29 DIAGNOSIS — N390 Urinary tract infection, site not specified: Secondary | ICD-10-CM | POA: Diagnosis present

## 2021-01-29 DIAGNOSIS — I5033 Acute on chronic diastolic (congestive) heart failure: Secondary | ICD-10-CM | POA: Diagnosis not present

## 2021-01-29 DIAGNOSIS — I340 Nonrheumatic mitral (valve) insufficiency: Secondary | ICD-10-CM | POA: Diagnosis not present

## 2021-01-29 DIAGNOSIS — G9340 Encephalopathy, unspecified: Secondary | ICD-10-CM | POA: Diagnosis not present

## 2021-01-29 DIAGNOSIS — A4101 Sepsis due to Methicillin susceptible Staphylococcus aureus: Secondary | ICD-10-CM | POA: Diagnosis present

## 2021-01-29 DIAGNOSIS — B9561 Methicillin susceptible Staphylococcus aureus infection as the cause of diseases classified elsewhere: Secondary | ICD-10-CM | POA: Diagnosis not present

## 2021-01-29 DIAGNOSIS — I472 Ventricular tachycardia, unspecified: Secondary | ICD-10-CM | POA: Diagnosis present

## 2021-01-29 DIAGNOSIS — G9341 Metabolic encephalopathy: Secondary | ICD-10-CM | POA: Diagnosis present

## 2021-01-29 DIAGNOSIS — I429 Cardiomyopathy, unspecified: Secondary | ICD-10-CM | POA: Diagnosis present

## 2021-01-29 DIAGNOSIS — I5082 Biventricular heart failure: Secondary | ICD-10-CM | POA: Diagnosis present

## 2021-01-29 DIAGNOSIS — E662 Morbid (severe) obesity with alveolar hypoventilation: Secondary | ICD-10-CM | POA: Diagnosis present

## 2021-01-29 DIAGNOSIS — Y848 Other medical procedures as the cause of abnormal reaction of the patient, or of later complication, without mention of misadventure at the time of the procedure: Secondary | ICD-10-CM | POA: Diagnosis present

## 2021-01-29 DIAGNOSIS — I5043 Acute on chronic combined systolic (congestive) and diastolic (congestive) heart failure: Secondary | ICD-10-CM

## 2021-01-29 DIAGNOSIS — J189 Pneumonia, unspecified organism: Secondary | ICD-10-CM | POA: Diagnosis not present

## 2021-01-29 DIAGNOSIS — J9621 Acute and chronic respiratory failure with hypoxia: Secondary | ICD-10-CM

## 2021-01-29 DIAGNOSIS — E861 Hypovolemia: Secondary | ICD-10-CM | POA: Diagnosis present

## 2021-01-29 DIAGNOSIS — E039 Hypothyroidism, unspecified: Secondary | ICD-10-CM | POA: Diagnosis present

## 2021-01-29 DIAGNOSIS — E785 Hyperlipidemia, unspecified: Secondary | ICD-10-CM | POA: Diagnosis present

## 2021-01-29 DIAGNOSIS — I420 Dilated cardiomyopathy: Secondary | ICD-10-CM | POA: Diagnosis not present

## 2021-01-29 DIAGNOSIS — E874 Mixed disorder of acid-base balance: Secondary | ICD-10-CM | POA: Diagnosis present

## 2021-01-29 DIAGNOSIS — J9622 Acute and chronic respiratory failure with hypercapnia: Secondary | ICD-10-CM | POA: Diagnosis present

## 2021-01-29 DIAGNOSIS — Z79899 Other long term (current) drug therapy: Secondary | ICD-10-CM

## 2021-01-29 DIAGNOSIS — I493 Ventricular premature depolarization: Secondary | ICD-10-CM | POA: Diagnosis not present

## 2021-01-29 DIAGNOSIS — D6959 Other secondary thrombocytopenia: Secondary | ICD-10-CM | POA: Diagnosis not present

## 2021-01-29 DIAGNOSIS — Z6841 Body Mass Index (BMI) 40.0 and over, adult: Secondary | ICD-10-CM

## 2021-01-29 DIAGNOSIS — R570 Cardiogenic shock: Principal | ICD-10-CM | POA: Diagnosis present

## 2021-01-29 DIAGNOSIS — A419 Sepsis, unspecified organism: Secondary | ICD-10-CM | POA: Diagnosis not present

## 2021-01-29 DIAGNOSIS — Z452 Encounter for adjustment and management of vascular access device: Secondary | ICD-10-CM

## 2021-01-29 DIAGNOSIS — T80219D Unspecified infection due to central venous catheter, subsequent encounter: Secondary | ICD-10-CM | POA: Diagnosis not present

## 2021-01-29 DIAGNOSIS — I5021 Acute systolic (congestive) heart failure: Secondary | ICD-10-CM | POA: Diagnosis not present

## 2021-01-29 DIAGNOSIS — T80219A Unspecified infection due to central venous catheter, initial encounter: Secondary | ICD-10-CM

## 2021-01-29 DIAGNOSIS — I5020 Unspecified systolic (congestive) heart failure: Secondary | ICD-10-CM

## 2021-01-29 DIAGNOSIS — I110 Hypertensive heart disease with heart failure: Secondary | ICD-10-CM | POA: Diagnosis present

## 2021-01-29 DIAGNOSIS — R600 Localized edema: Secondary | ICD-10-CM | POA: Diagnosis not present

## 2021-01-29 DIAGNOSIS — J15211 Pneumonia due to Methicillin susceptible Staphylococcus aureus: Secondary | ICD-10-CM | POA: Diagnosis not present

## 2021-01-29 DIAGNOSIS — R6521 Severe sepsis with septic shock: Secondary | ICD-10-CM | POA: Diagnosis present

## 2021-01-29 DIAGNOSIS — Z20822 Contact with and (suspected) exposure to covid-19: Secondary | ICD-10-CM | POA: Diagnosis present

## 2021-01-29 DIAGNOSIS — T80211A Bloodstream infection due to central venous catheter, initial encounter: Secondary | ICD-10-CM | POA: Diagnosis present

## 2021-01-29 DIAGNOSIS — Z79890 Hormone replacement therapy (postmenopausal): Secondary | ICD-10-CM

## 2021-01-29 DIAGNOSIS — E876 Hypokalemia: Secondary | ICD-10-CM | POA: Diagnosis present

## 2021-01-29 DIAGNOSIS — Z7189 Other specified counseling: Secondary | ICD-10-CM

## 2021-01-29 DIAGNOSIS — N179 Acute kidney failure, unspecified: Secondary | ICD-10-CM | POA: Diagnosis present

## 2021-01-29 DIAGNOSIS — R0603 Acute respiratory distress: Secondary | ICD-10-CM | POA: Diagnosis not present

## 2021-01-29 DIAGNOSIS — J9601 Acute respiratory failure with hypoxia: Secondary | ICD-10-CM | POA: Diagnosis not present

## 2021-01-29 DIAGNOSIS — R7303 Prediabetes: Secondary | ICD-10-CM | POA: Diagnosis present

## 2021-01-29 DIAGNOSIS — R739 Hyperglycemia, unspecified: Secondary | ICD-10-CM | POA: Diagnosis not present

## 2021-01-29 DIAGNOSIS — I509 Heart failure, unspecified: Secondary | ICD-10-CM | POA: Diagnosis not present

## 2021-01-29 DIAGNOSIS — J9602 Acute respiratory failure with hypercapnia: Secondary | ICD-10-CM | POA: Diagnosis not present

## 2021-01-29 DIAGNOSIS — F17220 Nicotine dependence, chewing tobacco, uncomplicated: Secondary | ICD-10-CM | POA: Diagnosis present

## 2021-01-29 HISTORY — DX: Cardiogenic shock: R57.0

## 2021-01-29 LAB — URINALYSIS, COMPLETE (UACMP) WITH MICROSCOPIC
Bilirubin Urine: NEGATIVE
Glucose, UA: NEGATIVE mg/dL
Ketones, ur: NEGATIVE mg/dL
Nitrite: NEGATIVE
Protein, ur: NEGATIVE mg/dL
RBC / HPF: >50 RBC/hpf — ABNORMAL HIGH (ref 0–5)
Specific Gravity, Urine: 1.019 (ref 1.005–1.030)
WBC, UA: >50 WBC/hpf — ABNORMAL HIGH (ref 0–5)
pH: 5 (ref 5.0–8.0)

## 2021-01-29 LAB — CBC WITH DIFFERENTIAL/PLATELET
Abs Immature Granulocytes: 0.06 10*3/uL (ref 0.00–0.07)
Basophils Absolute: 0 10*3/uL (ref 0.0–0.1)
Basophils Relative: 0 %
Eosinophils Absolute: 0 10*3/uL (ref 0.0–0.5)
Eosinophils Relative: 0 %
HCT: 35.1 % — ABNORMAL LOW (ref 39.0–52.0)
Hemoglobin: 12.1 g/dL — ABNORMAL LOW (ref 13.0–17.0)
Immature Granulocytes: 1 %
Lymphocytes Relative: 2 %
Lymphs Abs: 0.2 10*3/uL — ABNORMAL LOW (ref 0.7–4.0)
MCH: 29.7 pg (ref 26.0–34.0)
MCHC: 34.5 g/dL (ref 30.0–36.0)
MCV: 86 fL (ref 80.0–100.0)
Monocytes Absolute: 1 10*3/uL (ref 0.1–1.0)
Monocytes Relative: 10 %
Neutro Abs: 8.7 10*3/uL — ABNORMAL HIGH (ref 1.7–7.7)
Neutrophils Relative %: 87 %
Platelets: 104 10*3/uL — ABNORMAL LOW (ref 150–400)
RBC: 4.08 MIL/uL — ABNORMAL LOW (ref 4.22–5.81)
RDW: 13.7 % (ref 11.5–15.5)
WBC: 10 10*3/uL (ref 4.0–10.5)
nRBC: 0 % (ref 0.0–0.2)

## 2021-01-29 LAB — BASIC METABOLIC PANEL
Anion gap: 10 (ref 5–15)
BUN: 83 mg/dL — ABNORMAL HIGH (ref 8–23)
CO2: 33 mmol/L — ABNORMAL HIGH (ref 22–32)
Calcium: 8.1 mg/dL — ABNORMAL LOW (ref 8.9–10.3)
Chloride: 81 mmol/L — ABNORMAL LOW (ref 98–111)
Creatinine, Ser: 1.89 mg/dL — ABNORMAL HIGH (ref 0.61–1.24)
GFR, Estimated: 36 mL/min — ABNORMAL LOW (ref >60)
Glucose, Bld: 134 mg/dL — ABNORMAL HIGH (ref 70–99)
Potassium: 3.7 mmol/L (ref 3.5–5.1)
Sodium: 124 mmol/L — ABNORMAL LOW (ref 135–145)

## 2021-01-29 LAB — PROCALCITONIN
Procalcitonin: 0.74 ng/mL
Procalcitonin: 0.8 ng/mL

## 2021-01-29 LAB — SODIUM
Sodium: 122 mmol/L — ABNORMAL LOW (ref 135–145)
Sodium: 124 mmol/L — ABNORMAL LOW (ref 135–145)

## 2021-01-29 LAB — COOXEMETRY PANEL
Carboxyhemoglobin: 2.1 % — ABNORMAL HIGH (ref 0.5–1.5)
Carboxyhemoglobin: 1.3 % (ref 0.5–1.5)
Methemoglobin: 0.4 % (ref 0.0–1.5)
Methemoglobin: 0.8 % (ref 0.0–1.5)
O2 Saturation: 83.1 %
O2 Saturation: 72.3 %
Total hemoglobin: 12.4 g/dL (ref 12.0–16.0)
Total hemoglobin: 12.5 g/dL (ref 12.0–16.0)
Total oxygen content: 81 mL/dL

## 2021-01-29 LAB — OSMOLALITY, URINE
Osmolality, Ur: 311 mOsm/kg (ref 300–900)
Osmolality, Ur: 375 mOsm/kg (ref 300–900)

## 2021-01-29 LAB — OSMOLALITY: Osmolality: 286 mOsm/kg (ref 275–295)

## 2021-01-29 LAB — MAGNESIUM: Magnesium: 2.3 mg/dL (ref 1.7–2.4)

## 2021-01-29 LAB — PHOSPHORUS: Phosphorus: 5 mg/dL — ABNORMAL HIGH (ref 2.5–4.6)

## 2021-01-29 LAB — LACTIC ACID, PLASMA: Lactic Acid, Venous: 1.2 mmol/L (ref 0.5–1.9)

## 2021-01-29 LAB — GLUCOSE, CAPILLARY: Glucose-Capillary: 170 mg/dL — ABNORMAL HIGH (ref 70–99)

## 2021-01-29 LAB — BRAIN NATRIURETIC PEPTIDE: B Natriuretic Peptide: 333.2 pg/mL — ABNORMAL HIGH (ref 0.0–100.0)

## 2021-01-29 LAB — SODIUM, URINE, RANDOM: Sodium, Ur: <10 mmol/L

## 2021-01-29 MED ORDER — ONDANSETRON HCL 4 MG/2ML IJ SOLN: 4.0000 mg | Freq: Four times a day (QID) | INTRAMUSCULAR | Status: DC | PRN

## 2021-01-29 MED ORDER — AMIODARONE HCL IN DEXTROSE 360-4.14 MG/200ML-% IV SOLN: 60.0000 mg/h | INTRAVENOUS | Status: DC

## 2021-01-29 MED ORDER — AMIODARONE HCL IN DEXTROSE 360-4.14 MG/200ML-% IV SOLN
30.0000 mg/h | INTRAVENOUS | Status: DC
  Administered 2021-01-30 – 2021-02-03 (×8): 30 mg/h via INTRAVENOUS
  Filled 2021-01-29 (×10): qty 200

## 2021-01-29 MED ORDER — AMIODARONE HCL IN DEXTROSE 360-4.14 MG/200ML-% IV SOLN
30.0000 mg/h | INTRAVENOUS | Status: DC
  Administered 2021-01-29: 30 mg/h via INTRAVENOUS
  Filled 2021-01-29: qty 200

## 2021-01-29 MED ORDER — MILRINONE LACTATE IN DEXTROSE 20-5 MG/100ML-% IV SOLN: 0.1250 ug/kg/min | INTRAVENOUS | Status: DC

## 2021-01-29 MED ORDER — SODIUM CHLORIDE 0.9 % IV SOLN
2.0000 g | Freq: Two times a day (BID) | INTRAVENOUS | Status: DC
  Administered 2021-01-29 – 2021-01-31 (×5): 2 g via INTRAVENOUS
  Filled 2021-01-29 (×5): qty 2

## 2021-01-29 MED ORDER — SENNA 8.6 MG PO TABS
1.0000 | ORAL_TABLET | Freq: Every day | ORAL | Status: DC
  Administered 2021-01-29: 8.6 mg via ORAL
  Filled 2021-01-29: qty 1

## 2021-01-29 MED ORDER — POLYETHYLENE GLYCOL 3350 17 G PO PACK
17.0000 g | PACK | Freq: Every day | ORAL | Status: DC | PRN
  Filled 2021-01-29: qty 1

## 2021-01-29 MED ORDER — ACETAMINOPHEN 325 MG PO TABS: 650.0000 mg | ORAL_TABLET | ORAL | Status: DC | PRN

## 2021-01-29 MED ORDER — MILRINONE LACTATE IN DEXTROSE 20-5 MG/100ML-% IV SOLN
0.1250 ug/kg/min | INTRAVENOUS | Status: DC
  Administered 2021-01-29 – 2021-01-31 (×4): 0.25 ug/kg/min via INTRAVENOUS
  Administered 2021-02-01 – 2021-02-03 (×3): 0.125 ug/kg/min via INTRAVENOUS
  Filled 2021-01-29 (×10): qty 100

## 2021-01-29 MED ORDER — NOREPINEPHRINE 4 MG/250ML-% IV SOLN
INTRAVENOUS | Status: AC
  Administered 2021-01-29: 9 ug/min via INTRAVENOUS
  Filled 2021-01-29: qty 250

## 2021-01-29 MED ORDER — DOCUSATE SODIUM 100 MG PO CAPS: 100.0000 mg | ORAL_CAPSULE | Freq: Two times a day (BID) | ORAL | Status: DC | PRN

## 2021-01-29 MED ORDER — NOREPINEPHRINE 4 MG/250ML-% IV SOLN
0.0000 ug/min | INTRAVENOUS | Status: DC
  Administered 2021-01-30: 14 ug/min via INTRAVENOUS
  Filled 2021-01-29 (×2): qty 250

## 2021-01-29 MED ORDER — POTASSIUM CHLORIDE CRYS ER 20 MEQ PO TBCR
40.0000 meq | EXTENDED_RELEASE_TABLET | Freq: Once | ORAL | Status: AC
  Administered 2021-01-29: 40 meq via ORAL
  Filled 2021-01-29: qty 2

## 2021-01-29 MED ORDER — CHLORHEXIDINE GLUCONATE CLOTH 2 % EX PADS: 6.0000 | MEDICATED_PAD | Freq: Every day | CUTANEOUS | Status: DC

## 2021-01-29 MED ORDER — AMIODARONE HCL IN DEXTROSE 360-4.14 MG/200ML-% IV SOLN
60.0000 mg/h | INTRAVENOUS | Status: AC
  Administered 2021-01-29: 60 mg/h via INTRAVENOUS
  Filled 2021-01-29: qty 200

## 2021-01-29 MED ORDER — VANCOMYCIN HCL 1250 MG/250ML IV SOLN
1250.0000 mg | INTRAVENOUS | Status: DC
  Administered 2021-01-29 – 2021-01-30 (×2): 1250 mg via INTRAVENOUS
  Filled 2021-01-29 (×3): qty 250

## 2021-01-29 MED ORDER — AMIODARONE LOAD VIA INFUSION
150.0000 mg | Freq: Once | INTRAVENOUS | Status: AC
  Administered 2021-01-29: 150 mg via INTRAVENOUS
  Filled 2021-01-29: qty 83.34

## 2021-01-29 MED ORDER — HEPARIN SODIUM (PORCINE) 5000 UNIT/ML IJ SOLN
5000.0000 [IU] | Freq: Three times a day (TID) | INTRAMUSCULAR | Status: DC
  Administered 2021-01-29 – 2021-02-11 (×39): 5000 [IU] via SUBCUTANEOUS
  Filled 2021-01-29 (×37): qty 1

## 2021-01-29 MED ORDER — DOCUSATE SODIUM 100 MG PO CAPS
100.0000 mg | ORAL_CAPSULE | Freq: Every day | ORAL | Status: DC
  Administered 2021-01-29: 100 mg via ORAL
  Filled 2021-01-29: qty 1

## 2021-01-29 MED ORDER — SODIUM CHLORIDE 0.9 % IV SOLN
3.0000 g | Freq: Four times a day (QID) | INTRAVENOUS | Status: DC
  Administered 2021-01-29: 3 g via INTRAVENOUS
  Filled 2021-01-29 (×5): qty 8

## 2021-01-29 MED ORDER — AMIODARONE HCL IN DEXTROSE 360-4.14 MG/200ML-% IV SOLN
INTRAVENOUS | Status: AC
  Administered 2021-01-29: 30 mg/h via INTRAVENOUS
  Filled 2021-01-29: qty 200

## 2021-01-29 MED ORDER — FUROSEMIDE 10 MG/ML IJ SOLN
4.0000 mg/h | INTRAVENOUS | Status: DC
  Administered 2021-01-29: 4 mg/h via INTRAVENOUS
  Filled 2021-01-29: qty 20

## 2021-01-29 MED ORDER — NOREPINEPHRINE 4 MG/250ML-% IV SOLN: 0.0000 ug/min | INTRAVENOUS | Status: DC

## 2021-01-29 MED ORDER — AMIODARONE HCL IN DEXTROSE 360-4.14 MG/200ML-% IV SOLN
30.0000 mg/h | INTRAVENOUS | Status: DC
Start: 2021-01-29 — End: 2021-02-11

## 2021-01-29 NOTE — Progress Notes (Signed)
    S: Pt arrived from [facility] to 2H15.  Son @ bedside.  Pt resting w/ eyes closed - easily arousable.  Denies c/p or dyspnea.   O:  97.7, 67, 20, 112/67, SpO2 96% 2lpm. Pleasant.  Sl more arousable than when I saw him @ [facility] this AM.  AAOx3.  Mouth breathing.  Neck obese - difficult to gauge JVP.  Lungs diminished w/ intermittent insp/exp wheezing, and coughing followed by coarse rhonchi.  Abd obese, semi-firm, nontender, bs+x4. Ext w/ 2+ bilat LE edema.  Lab Results  Component Value Date   CREATININE 1.89 (H) 01/29/2021   BUN 83 (H) 01/29/2021   NA 122 (L) 01/29/2021   K 3.7 01/29/2021   CL 81 (L) 01/29/2021   CO2 33 (H) 01/29/2021    A/P:    1. Acute HFrEF/NICM/CGS:  See note from this AM.  EF 25-30%. Relatively nl cors on cath w/ elevated filling pressures 10/18  Milrinone  Hypotension and NSVT  Milrinone reduced and levo + amio started.  Lasix gtt started this AM in the setting of progressive volume overload and worsening dyspnea.  UO has been good today - 120 ml just since arrival @ [facility] an hour ago.  Remains volume overloaded w/ compromised resp status/mouth breathing.  Cont lasix gtt.  BMET in AM.  Cont milrinone @ 0.125 mcg - ongoing ectopy but less than what we saw this AM. Cont amio.  Levo for BP support - currently stable.  CHF team to see.  2.  NSVT:  improved since this AM, though still freq PVCs and bigeminy.  Cont amio.  F/u bmet in AM.  3.  AKI:  f/u bmet in AM following initiation of lasix gtt today.  4.  Hypotension/Shock:  see #1.  Abx added this AM out of concern for possilbe developing PNA.   Currently BP stable on levo.  Murray Hodgkins, NP

## 2021-01-29 NOTE — Progress Notes (Addendum)
Pharmacy Antibiotic Note  Anthony Weber is a 75 y.o. male admitted on 01/29/2021 with cardiogenic shock. Pharmacy has been consulted for cefepime and vancomycin dosing for possible UTI/sepsis.  Plan: Cefepime 2g IV q12h Vancomycin 1250mg  IV q24h - est AUC 523    Temp (24hrs), Avg:98.3 F (36.8 C), Min:98.1 F (36.7 C), Max:98.8 F (37.1 C)  Recent Labs  Lab 01/27/21 0447 01/27/21 1205 01/27/21 2242 01/28/21 0416 01/28/21 1118 01/29/21 0602  WBC 6.9  --   --   --   --  10.0  CREATININE 1.24 1.49* 1.38* 1.48*  --  1.89*  LATICACIDVEN  --   --   --   --  1.3  --     Estimated Creatinine Clearance: 48.2 mL/min (A) (by C-G formula based on SCr of 1.89 mg/dL (H)).    No Known Allergies  Arrie Senate, PharmD, BCPS, [facility] Clinical Pharmacist [phone] Please check AMION for all [place] numbers 01/29/2021

## 2021-01-29 NOTE — Discharge Summary (Signed)
Physician Discharge Summary  Patient ID: Anthony Weber MRN: [id number] DOB/AGE: [birth year] 75 y.o.  Admit date: 01/18/2021 Discharge date: 01/29/2021  Admission Diagnoses: NON-ISCHEMIC CARDIOMYOPATHY AND CARDIOGENIC SHOCK  Discharge Diagnoses:  Active Problems:   Acute CHF (congestive heart failure) (HCC)   Acute HFrEF (heart failure with reduced ejection fraction) ([place])   Primary hypertension   Discharged Condition: CRITICAL  Hospital Course:   75 y.o with significant PMH of morbid obesity, hypertension, hypothyroidism, hyperlipidemia, congestive heart failure anxiety and depression who presented to the ED with chief complaints of progressive SOB, dyspnea, abdominal distention, weight gain of 30 pounds, orthopnea, PND, and bilateral lower extremity edema x2 weeks   ED Course: On arrival to the ED, he was afebrile with blood pressure 137/76 mm Hg and pulse rate 91 beats/min. Sats 97% on 2L. There were no focal neurological deficits; he was alert and oriented x4, but with significant dyspnea with minimal exertion. Labs/Diagnostics CMP remarkable for albumin of 3.4 with total protein 6.2.  BNP was 951 and high-sensitivity troponin I was 20.  CBC was unremarkable.  Influenza antigens and COVID-19 PCR came back negative. EKG:  showed normal sinus rhythm with a rate of 86 with poor R wave progression and low voltage QRS. Imaging: Chest x-ray showed borderline cardiomegaly and vascular congestion with left lower lobe atelectasis or infiltrates with small left pleural effusion. Due to significant volume overload patient was started on IV Lasix 40 mg twice daily and admitted to PCU under hospitalist service with cardiology consultation.   Hospital Course: During the course of his admission patient had an echo which showed newly reduced EF of 25 to 30%.  Volume status continued to improve with IV diuresis, he was started on lisinopril with plan to transition to [facility], +/- addition of  spironolactone pending renal function improvement. He was also continued on Coreg and Lasix increased to 40 mg Q6 with addition of metolazone for optimization.  Patient underwent right and left heart catheterization on 10/18 which showed no significant CAD.  Was noted with moderately elevated left heart and PA pressures as well as severely elevated right heart pressures.  Telemetry showed normal sinus rhythm with frequent PVCs and brief episodes of atrial runs.  Patient was transferred to the ICU for medical optimization with inotropes, vasopressors and antiarrhythmic as needed.  Upon arrival, pt was on Milrinone gtt and noted with borderline SBP low 80s & sometimes in upper 70s. PCCM consulted to assist with management of vasoactive medication.   Past Medical History  Hypertension Hypothyroidism Hyperlipidemia Congestive heart failure Anxiety and depression Morbid obesity   Significant Hospital Events   10/09: Admitted to PCU with acute on chronic diastolic CHF exacerbation 10/10: Cardiology consulted 10/18: Patient underwent right and left heart cath.  Transferred to stepdown for medical optimization.  PCCM consulted  Consults:  Cardiology PCCM   Procedures:  10/18: Left and right heart catheterization 10/18: Right IJ central line placement   Significant Diagnostic Tests:  01/18/2021: Chest Xray>Borderline cardiomegaly, vascular congestion. Left lower lobe atelectasis or infiltrate.  Small left effusion   Micro Data:  10/9: SARS-CoV-2 PCR> negative 10/9: Influenza PCR> negative     Treatments:   Acute Hypoxic Respiratory Failure secondary to Acute Decompensated HFrEF  Severe cardiogenic shock NON-ISCHEMIC CARDIOMYOPATHY  -Supplemental O2 as needed to maintain O2 saturations 88 to 92% -High risk for intubation -Follow intermittent ABG and chest x-ray as needed -IV Lasix as blood pressure and renal function permits; currently on Lasix 40 mg IV  -  As needed bronchodilators    Acute on chronic HFrEF, newly reduced EF of 25-30% by Echo on 01/19/21 Cardiogenic Shock (Volume Overload, with Low-Output) Anasarca Hx: Hypertension, HLD status post Right and left heart catheterization with no significant CAD -Continuous cardiac monitoring -Diuretics: Furosemide 40 mg IV diureses >1L negative per day until approach euvolemia / worsening renal function. -Inotropic Support: Continue milrinone for acute cardiogenic shock / low-output failure -Start Levophed with Goal MAP >65 mm Hg -Hold BP meds -Low salt diet  -Strict I&Os -Cardiology following, appreciate input   Frequent PVCs with episodes of Atrial runs -Start Amiodarone -Monitor electrolytes and replace as indicated -Serial EKG -TSH elevated  9.059, Normal T3 and slightly elevated T4, will treat underlying thyroid dysfunction   AKI -Monitor I&O's / urinary output -Follow BMP -Ensure adequate renal perfusion -Avoid nephrotoxic agents as able -Replace electrolytes as indicated   Hypotonic Hyponatremia Hypervolemic likely in the setting of volume overload as above -Check cortisol, urinary sodium, serum osmolality, sodium osmolality -Monitor worsening Na while on Diuretics -Water restriction -Check serial sodium -Strict I&Os   Hypothyroidism Known hx of hypothyroidism on synthroid -TSH elevated  9.059, Normal T3 and slightly elevated T4 -increase Synthroid to 75 mcg and recheck     Discharge Exam: Blood pressure (!) 111/46, pulse 78, temperature 98.2 F (36.8 C), temperature source Oral, resp. rate (!) 21, height 5\' 10"  (1.778 m), weight (!) 146.4 kg, SpO2 92 %.  PHYSICAL EXAMINATION:  GENERAL:critically ill appearing, +resp distress EYES: Pupils equal, round, reactive to light.  No scleral icterus.  MOUTH: Moist mucosal membrane. INTUBATED NECK: Supple.  PULMONARY: +rhonchi, CARDIOVASCULAR: S1 and S2.  No murmurs  GASTROINTESTINAL: Soft, nontender, -distended. Positive bowel sounds.   MUSCULOSKELETAL: No swelling, clubbing, or edema.  NEUROLOGIC: lethargic but arousable SKIN:intact,warm,dry    The Risks and Benefits of TRANSFER TO [place] WAS EXPLAINED TO FAMILY  increased chance of Respiratory Failure and Cardiac Arrest, as well as increased Stroke and Death.   The Family understand the risks and benefits and have agreed to proceed with Transfer    Allergies as of 01/29/2021   No Known Allergies      Medication List     STOP taking these medications    aspirin 81 MG chewable tablet   cholecalciferol 10 MCG (400 UNIT) Tabs tablet Commonly known as: VITAMIN D3   folic acid 1 MG tablet Commonly known as: FOLVITE   furosemide 20 MG tablet Commonly known as: LASIX   ketoconazole 2 % shampoo Commonly known as: NIZORAL   levothyroxine 50 MCG tablet Commonly known as: SYNTHROID   lisinopril 40 MG tablet Commonly known as: ZESTRIL   mirtazapine 30 MG tablet Commonly known as: REMERON   polyethylene glycol powder 17 GM/SCOOP powder Commonly known as: GLYCOLAX/MIRALAX   pravastatin 40 MG tablet Commonly known as: PRAVACHOL   traZODone 100 MG tablet Commonly known as: DESYREL   triamcinolone cream 0.1 % Commonly known as: KENALOG   VITAMIN B 12 PO   vitamin C 100 MG tablet       TAKE these medications    amiodarone 360-4.14 MG/200ML-% Soln Commonly known as: NEXTERONE PREMIX Inject 60 mg/hr into the vein continuous.   amiodarone 360-4.14 MG/200ML-% Soln Commonly known as: NEXTERONE PREMIX Inject 30 mg/hr into the vein continuous.   milrinone 20 MG/100 ML Soln infusion Commonly known as: PRIMACOR Inject 0.0177 mg/min into the vein continuous.   norepinephrine 4-5 MG/250ML-% Soln Commonly known as: LEVOPHED Inject 0-40 mcg/min into the vein  continuous.               Durable Medical Equipment  (From admission, onward)           Start     Ordered   01/22/21 1219  For home use only DME Walker rolling  Once        Comments: Needs bariatric rolling walker  Question Answer Comment  Walker: With 5 Inch Wheels   Patient needs a walker to treat with the following condition CHF (congestive heart failure) ([place])      01/22/21 1219             Signed: Maretta Bees Khalel Alms 01/29/2021, 3:51 PM

## 2021-01-29 NOTE — Progress Notes (Signed)
Pharmacy Antibiotic Note  Anthony Weber is a 75 y.o. male admitted on 01/18/2021. Patient with ECHO showing newly reduced EF 25-30%. Patient underwent cardiac catheterization which showed no significant CAD. Patient was transferred to the ICU for milrinone and norepinephrine. He has now been started on amiodarone for frequent runs of ventricular tachycardia and PVCs. Concern for sepsis/aspiration pneumonia. Pharmacy has been consulted for Unasyn dosing.  Plan: Unasyn 3 g IV q6h  Height: 5\' 10"  (177.8 cm) Weight: (!) 146.4 kg (322 lb 12.1 oz) IBW/kg (Calculated) : 73  Temp (24hrs), Avg:98.5 F (36.9 C), Min:98.2 F (36.8 C), Max:98.8 F (37.1 C)  Recent Labs  Lab 01/27/21 0447 01/27/21 1205 01/27/21 2242 01/28/21 0416 01/28/21 1118 01/29/21 0602  WBC 6.9  --   --   --   --  10.0  CREATININE 1.24 1.49* 1.38* 1.48*  --  1.89*  LATICACIDVEN  --   --   --   --  1.3  --     Estimated Creatinine Clearance: 48.2 mL/min (A) (by C-G formula based on SCr of 1.89 mg/dL (H)).    No Known Allergies  Antimicrobials this admission: Unasyn 10/20 >>  Microbiology results: N/A  Thank you for allowing pharmacy to be a part of this patient's care.  Tawnya Crook, PharmD, BCPS Clinical Pharmacist 01/29/2021 2:05 PM

## 2021-01-29 NOTE — Progress Notes (Signed)
eLink Physician-Brief Progress Note Patient Name: Anthony Weber DOB: 1945-07-29 MRN: [id number]   Date of Service  01/29/2021  HPI/Events of Note  Patient transferred from [facility] ICU with cardiogenic shock secondary to non-ischemic cardiomyopathy.  eICU Interventions  New Patient Evaluation completed.        Frederik Pear 01/29/2021, 8:44 PM

## 2021-01-29 NOTE — Consult Note (Addendum)
Advanced Heart Failure Team Consult Note   Primary Physician: Albina Billet, MD PCP-Cardiologist:  Ida Rogue, MD  Reason for Consultation: cardiogenic shock   HPI:    Anthony Weber is seen today for evaluation of cardiogenic shock at the request of Dr. Rockey Situ, Cardiology.   75 y/o old male, transferred from [facility] for cardiogenic shock.    Anthony Weber is a 75 year old with history includes diastolic HF, HTN, HLD and obesity. Had Echo 04/18/20 w/ normal LVEF 60-65% w/ G2DD and normal RV.  Over the last 6 months he has had functional decline. Slowly accumulating lower extremity edema.   Presented to [facility] 10/9 w/ complaint of several week h/o worsening SOB, orthopnea, LEE and ~30 lb wt gain. ED w/u confirmed  A/C CHF. Echo w/ reduced EF 25-30% from previous EF 60-65%. visualized. Hs trop 20>>22. R/LHC w/ no CAD, moderately elevated left heart and pulmonary artery pressures, severely elevated right heart filling pressures, severely reduced Fick cardiac output/index. CI 1.5. Started on lasix drip. Placed on Milrinone + NE. Amio gtt started for frequent ectopy w/ PVCs and NSVT. Despite pressors had persistent shock.    Transferred to [facility] last night for shock management. Remains on norepi. Sluggish diuresis noted.   Current on norepi 9. CO-OX 79.5%  + amio 30 mg per hour. SVR < 500.   Blood CX-2/2 Gram + Cocci ---> MSSA   Cardiac Testing  Echo 01/19/21: LVEF 25-30% RV not well visualized.  Echo 04/2020 EF 50-55% Grade II DD  R/LHC 01/27/21 RA 20  PCWP 25  PA 48/29 (35)  CO 3.7 CI 1.5   Review of Systems: [y] = yes, [ ]  = no  Obtained from chart and wife.   General: Weight gain [ ] ; Weight loss [ ] ; Anorexia [ ] ; Fatigue [ Y]; Fever [ ] ; Chills [ ] ; Weakness [ Y]  Cardiac: Chest pain/pressure [ ] ; Resting SOB [ ] ; Exertional SOB [ ] ; Orthopnea [ ] ; Pedal Edema [ ] ; Palpitations [ ] ; Syncope [ ] ; Presyncope [ ] ; Paroxysmal nocturnal dyspnea[ ]   Pulmonary: Cough [ ] ;  Wheezing[ ] ; Hemoptysis[ ] ; Sputum [ ] ; Snoring [ ]   GI: Vomiting[ ] ; Dysphagia[ ] ; Melena[ ] ; Hematochezia [ ] ; Heartburn[ ] ; Abdominal pain [ ] ; Constipation [ ] ; Diarrhea [ ] ; BRBPR [ ]   GU: Hematuria[ ] ; Dysuria [ ] ; Nocturia[ ]   Vascular: Pain in legs with walking [ ] ; Pain in feet with lying flat [ ] ; Non-healing sores [ ] ; Stroke [ ] ; TIA [ ] ; Slurred speech [ ] ;  Neuro: Headaches[ ] ; Vertigo[ ] ; Seizures[ ] ; Paresthesias[ ] ;Blurred vision [ ] ; Diplopia [ ] ; Vision changes [ ]   Ortho/Skin: Arthritis [ ] ; Joint pain [ Y]; Muscle pain [ ] ; Joint swelling [ ] ; Back Pain [ Y]; Rash [ ]   Psych: Depression[ ] ; Anxiety[ ]   Heme: Bleeding problems [ ] ; Clotting disorders [ ] ; Anemia [ ]   Endocrine: Diabetes [ ] ; Thyroid dysfunction[ ]   Home Medications Prior to Admission medications   Medication Sig Start Date End Date Taking? Authorizing Provider  amiodarone (NEXTERONE PREMIX) 360-4.14 MG/200ML-% SOLN Inject 60 mg/hr into the vein continuous. 01/29/21   Flora Lipps, MD  amiodarone (NEXTERONE PREMIX) 360-4.14 MG/200ML-% SOLN Inject 30 mg/hr into the vein continuous. 01/29/21   Flora Lipps, MD  Ascorbic Acid (VITAMIN C) 100 MG tablet Take 100 mg by mouth daily.    [provider]  aspirin 81 MG chewable tablet Chew by mouth.  [provider]  cholecalciferol (VITAMIN D) 400 UNITS TABS tablet Take 400 Units by mouth.    [provider]  Cyanocobalamin (VITAMIN B 12 PO) Take by mouth.    [provider]  folic acid (FOLVITE) 1 MG tablet Take 1 mg by mouth daily.    [provider]  furosemide (LASIX) 20 MG tablet Take 20 mg by mouth daily.    [provider]  ketoconazole (NIZORAL) 2 % shampoo Apply topically. 03/18/20   [provider]  levothyroxine (SYNTHROID, LEVOTHROID) 50 MCG tablet Take 50 mcg by mouth daily before breakfast.    [provider]  lisinopril (PRINIVIL,ZESTRIL) 40 MG tablet Take 40 mg by mouth daily.     [provider]  milrinone (PRIMACOR) 20 MG/100 ML SOLN infusion Inject 0.0177 mg/min into the vein continuous. 01/29/21   Flora Lipps, MD  mirtazapine (REMERON) 30 MG tablet Take 30 mg by mouth at bedtime. 01/15/21   [provider]  norepinephrine (LEVOPHED) 4-5 MG/250ML-% SOLN Inject 0-40 mcg/min into the vein continuous. 01/29/21   Flora Lipps, MD  polyethylene glycol powder (GLYCOLAX/MIRALAX) 17 GM/SCOOP powder Take as directed for colonoscopy prep. 01/14/15   [provider]  pravastatin (PRAVACHOL) 40 MG tablet Take 40 mg by mouth daily.    [provider]  traZODone (DESYREL) 100 MG tablet Take 1 tablet by mouth at bedtime. 01/02/21   [provider]  triamcinolone (KENALOG) 0.1 % Apply topically. 01/08/20   [provider]    Past Medical History: Past Medical History:  Diagnosis Date   Depression    Hypertension    Hypothyroidism     Past Surgical History: Past Surgical History:  Procedure Laterality Date   COLONOSCOPY WITH PROPOFOL N/A 03/03/2015   Procedure: COLONOSCOPY WITH PROPOFOL;  Surgeon: Manya Silvas, MD;  Location: [facility];  Service: Endoscopy;  Laterality: N/A;   EYE SURGERY     RIGHT/LEFT HEART CATH AND CORONARY ANGIOGRAPHY N/A 01/27/2021   Procedure: RIGHT/LEFT HEART CATH AND CORONARY ANGIOGRAPHY;  Surgeon: Nelva Bush, MD;  Location: [facility];  Service: Cardiovascular;  Laterality: N/A;    Family History: No family history on file.  Social History: Social History   Socioeconomic History   Marital status: Married    Spouse name: Amery Minasyan   Number of children: Not on file   Years of education: Not on file   Highest education level: Not on file  Occupational History   Not on file  Tobacco Use   Smoking status: Former   Smokeless tobacco: Former    Types: Nurse, children's Use: Unknown  Substance and Sexual Activity   Alcohol use: No   Drug use: Not on  file   Sexual activity: Not on file  Other Topics Concern   Not on file  Social History Narrative   Not on file   Social Determinants of Health   Financial Resource Strain: Not on file  Food Insecurity: Not on file  Transportation Needs: Not on file  Physical Activity: Not on file  Stress: Not on file  Social Connections: Not on file    Allergies:  No Known Allergies  Objective:    Vital Signs:   Temp:  [96.7 F (35.9 C)-98.2 F (36.8 C)] 96.7 F (35.9 C) (10/21 0700) Pulse Rate:  [25-141] 69 (10/21 0600) Resp:  [17-27] 19 (10/21 0600) BP: (78-132)/(34-112) 101/49 (10/21 0600) SpO2:  [89 %-99 %] 97 % (10/21 0600) Weight:  [  146.4 kg-147.8 kg] 147.8 kg (10/21 0500) Last BM Date: 01/25/21  Weight change: Filed Weights   01/30/21 0500  Weight: (!) 147.8 kg    Intake/Output:   Intake/Output Summary (Last 24 hours) at 01/30/2021 0759 Last data filed at 01/30/2021 0700 Gross per 24 hour  Intake 675.5 ml  Output 790 ml  Net -114.5 ml      Physical Exam   CVP 13 General:  Drowsy  HEENT: normal Neck: supple. JVP difficult to assess due to body habitus . Carotids 2+ bilat; no bruits. No lymphadenopathy or thyromegaly appreciated. Cor: PMI nondisplaced. Irregular rate & rhythm. No rubs, gallops or murmurs. Lungs: Decreased  Abdomen: soft, nontender, nondistended. No hepatosplenomegaly. No bruits or masses. Good bowel sounds. Extremities: no cyanosis, clubbing, rash, R and LLE 3+ edema. RUE PICC  Neuro: Drowsy  Telemetry  SR with NSVT and PVCs. 60-70s   EKG    SR low volts 86 bpmSR   Labs   Basic Metabolic Panel: Recent Labs  Lab 01/27/21 0447 01/27/21 1205 01/27/21 2242 01/28/21 0416 01/28/21 1900 01/29/21 0029 01/29/21 0602 01/29/21 1124 01/30/21 0526  NA 126* 126* 127* 126* 124* 124* 124* 122* 122*  K 3.5 4.0 4.0 3.8  --   --  3.7  --  4.0  CL 84* 82* 81* 81*  --   --  81*  --  80*  CO2 32 32 35* 34*  --   --  33*  --  32  GLUCOSE 102* 104*  142* 172*  --   --  134*  --  149*  BUN 53* 59* 66* 69*  --   --  83*  --  85*  CREATININE 1.24 1.49* 1.38* 1.48*  --   --  1.89*  --  2.08*  CALCIUM 7.9* 8.0* 8.1* 8.1*  --   --  8.1*  --  8.0*  MG 2.2  --  2.5* 2.3  --   --  2.3  --  2.4  PHOS  --   --   --   --   --   --  5.0*  --  4.6    Liver Function Tests: Recent Labs  Lab 01/30/21 0526  AST 30  ALT 31  ALKPHOS 66  BILITOT 1.1  PROT 5.8*  ALBUMIN 2.4*   No results for input(s): LIPASE, AMYLASE in the last 168 hours. No results for input(s): AMMONIA in the last 168 hours.  CBC: Recent Labs  Lab 01/27/21 0447 01/29/21 0602  WBC 6.9 10.0  NEUTROABS  --  8.7*  HGB 12.8* 12.1*  HCT 37.0* 35.1*  MCV 87.7 86.0  PLT 92* 104*    Cardiac Enzymes: No results for input(s): CKTOTAL, CKMB, CKMBINDEX, TROPONINI in the last 168 hours.  BNP: BNP (last 3 results) Recent Labs    01/19/21 1237 01/27/21 0447 01/29/21 2020  BNP 942.4* 681.0* 333.2*    ProBNP (last 3 results) No results for input(s): PROBNP in the last 8760 hours.   CBG: Recent Labs  Lab 01/29/21 1931  GLUCAP 170*    Coagulation Studies: No results for input(s): LABPROT, INR in the last 72 hours.   Imaging   DG Chest Port 1 View  Result Date: 01/29/2021 CLINICAL DATA:  Dyspnea EXAM: PORTABLE CHEST 1 VIEW COMPARISON:  01/27/2021 FINDINGS: Right jugular central venous catheter tip near the cavoatrial junction unchanged. No pneumothorax Bibasilar airspace disease left greater than right unchanged. No significant effusion. IMPRESSION: Central line in the cavoatrial junction Bibasilar  airspace disease left greater than right unchanged. Electronically Signed   By: Franchot Gallo M.D.   On: 01/29/2021 14:54     Medications:     Current Medications:   Infusions:     Patient Profile  Anthony Weber is a 75 year old with history includes diastolic HF, HTN, HLD and obesity. Had Echo 04/18/20 w/ normal LVEF 60-65% w/ G2DD and normal  RV.  Shock--cardiogenic/septic    Assessment/Plan   Shock  Cardiogenic/Septic  Procalcitonin 0.8  Lactic Acid 1.2 . RHC cardiac index 1.5  -Bld Cultures 2/2 MSSA- narrow antibiotics. Eventually will need TEE -Urine Culture- pending  -Sputum pending  - On Norepi mcg with soft BP. CO-OX elevated SVR  480 --> sepsis.  - Place A line   2. Acute Systolic Heart Failure  Echo EF now down from 55-->20-25%. Cath with no CAD, elevated filling pressures and low output heart failure.  - High PVC burden. 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 On amio to suppress burden  - Marked volume overload. Will give 80 mg IV lasix now and start lasix drip 15 mg per hour.  - No room for GDMT with hypotension.   3. Acute Hypoxic Respiratory Failure  -On nasal cannula with stable sats. Drowsy difficult to arouse. Check ABG now.  -May need Bipap   4. NSVT, PVC  -High PVC burden On amio drip. Continue to suppress PVC/NSVT  5. AKI Stage  -Admit creatinine 1.4>1.5>1.9>2.1  - Check BMET daily   6. Hyponatremia -Sodium 122 would benefit from tolvaptan but want to make sure he can protect airway.    7. Suspected Sleep Apnea -Check ABG . May need Bipap   8. Hypothyroidism  -TSH 9, T3 2.2 T4 1.2   9. Morbid Obesity   10.  AMS Check ammonia/ABG.     Length of Stay: [place], NP  01/30/2021, 7:59 AM  Advanced Heart Failure Team Pager [phone] (M-F; [place])  Please contact [facility] for night-coverage after hours (4p -7a ) and weekends on [email or web address]   Agree with above.   On NE and milrinone. EF 25% Remains sluggish. Markedly volume overloaded. Thick sputum. Bcl + for MSSA  Co-ox 78  General:  Obese male lethargic but arousable  HEENT: normal Neck: supple. JVP to ear Carotids 2+ bilat; no bruits. No lymphadenopathy or thryomegaly appreciated. Cor: PMI nondisplaced. Irregular Lungs: coarse Abdomen: obese  soft, nontender, + distended. No hepatosplenomegaly. No bruits or masses. Good  bowel sounds. 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 Initial presentation consistent with cardiogenic shock but now with septic overlay with PNA and MSSA bactermia  - continue NE/milrinone - start lasix gtt - UNNA boots - broad spectrum abx - place arterial line to follow bp and check for CO2 retention - eventual TEE to look for endocarditis associated with MSSA  CRITICAL CARE Performed by: Glori Bickers  Total critical care time: 55 minutes  Critical care time was exclusive of separately billable procedures and treating other patients.  Critical care was necessary to treat or prevent imminent or life-threatening deterioration.  Critical care was time spent personally by me (independent of midlevel providers or residents) on the following activities: development of treatment plan with patient and/or surrogate as well as nursing, discussions with consultants, evaluation of patient's response to treatment, examination of patient, obtaining history from  patient or surrogate, ordering and performing treatments and interventions, ordering and review of laboratory studies, ordering and review of radiographic studies, pulse oximetry and re-evaluation of patient's condition. Glori Bickers, MD  10:58 AM

## 2021-01-29 NOTE — Progress Notes (Signed)
NAME:  Anthony Weber, MRN:  [id number], DOB:  08-30-1945, LOS: 7 ADMISSION DATE:  01/18/2021, CONSULTATION DATE:  01/27/2021  HPI  75 y.o with significant PMH of morbid obesity, hypertension, hypothyroidism, hyperlipidemia, congestive heart failure anxiety and depression who presented to the ED with chief complaints of progressive SOB, dyspnea, abdominal distention, weight gain of 30 pounds, orthopnea, PND, and bilateral lower extremity edema x2 weeks  ED Course: On arrival to the ED, he was afebrile with blood pressure 137/76 mm Hg and pulse rate 91 beats/min. Sats 97% on 2L. There were no focal neurological deficits; he was alert and oriented x4, but with significant dyspnea with minimal exertion. Labs/Diagnostics CMP remarkable for albumin of 3.4 with total protein 6.2.  BNP was 951 and high-sensitivity troponin I was 20.  CBC was unremarkable.  Influenza antigens and COVID-19 PCR came back negative. EKG:  showed normal sinus rhythm with a rate of 86 with poor R wave progression and low voltage QRS. Imaging: Chest x-ray showed borderline cardiomegaly and vascular congestion with left lower lobe atelectasis or infiltrates with small left pleural effusion. Due to significant volume overload patient was started on IV Lasix 40 mg twice daily and admitted to PCU under hospitalist service with cardiology consultation.  Hospital Course: During the course of his admission patient had an echo which showed newly reduced EF of 25 to 30%.  Volume status continued to improve with IV diuresis, he was started on lisinopril with plan to transition to [facility], +/- addition of spironolactone pending renal function improvement. He was also continued on Coreg and Lasix increased to 40 mg Q6 with addition of metolazone for optimization.  Patient underwent right and left heart catheterization on 10/18 which showed no significant CAD.  Was noted with moderately elevated left heart and PA pressures as well as  severely elevated right heart pressures.  Telemetry showed normal sinus rhythm with frequent PVCs and brief episodes of atrial runs.  Patient was transferred to the ICU for medical optimization with inotropes, vasopressors and antiarrhythmic as needed.  Upon arrival, pt was on Milrinone gtt and noted with borderline SBP low 80s & sometimes in upper 70s. PCCM consulted to assist with management of vasoactive medication.  Past Medical History  Hypertension Hypothyroidism Hyperlipidemia Congestive heart failure Anxiety and depression Morbid obesity  Significant Hospital Events   10/09: Admitted to PCU with acute on chronic diastolic CHF exacerbation 10/10: Cardiology consulted 10/18: Patient underwent right and left heart cath.  Transferred to stepdown for medical optimization.  PCCM consulted 10/20: Pt with increased work of breathing overnight along with frequent runs of Vtach and PVC's and required levophed gtt to be titrated up to 8 mcg/min due to hypotension; pt now with frequent ectopy, however respiratory status has improved  Consults:  Cardiology PCCM  Procedures:  10/18: Left and right heart catheterization 10/18: Right IJ central line placement  Significant Diagnostic Tests:  10/09: Chest Xray Borderline cardiomegaly, vascular congestion. Left lower lobe atelectasis or infiltrate.  Small left effusion 10/10: Echo revealed EF 25 to 30%; left ventricle has severely decreased function. Left ventricular endocardial border not optimally defined to evaluate regional wall motion 10/18: Cardiac Cath revealed No angiographically significant coronary artery disease. Moderately elevated left heart and pulmonary artery pressures. Severely elevated right heart filling pressure. Severely reduced Fick cardiac output/index.  Micro Data:  10/9: SARS-CoV-2 PCR> negative 10/9: Influenza PCR> negative  Antimicrobials:  None  OBJECTIVE  Blood pressure (!) 79/49, pulse 84, temperature 98.8 F  (37.1  C), temperature source Oral, resp. rate 18, height 5\' 10"  (1.778 m), weight (!) 141.8 kg, SpO2 92 %. CVP:  [3 mmHg-18 mmHg] 7 mmHg      Intake/Output Summary (Last 24 hours) at 01/29/2021 0721 Last data filed at 01/29/2021 0030 Gross per 24 hour  Intake 246 ml  Output 1150 ml  Net -904 ml    Filed Weights   01/26/21 0628 01/26/21 1030 01/27/21 0658  Weight: (!) 145.5 kg (!) 136.9 kg (!) 141.8 kg   Physical Examination  GENERAL: Chronically ill appearing obese male, NAD  HEENT: Supple, unable to assess for JVD due to large neck LUNGS: Crackles throughout, even, non labored  CARDIOVASCULAR: S1S2, frequent runs of vtach and PVC's, systolic murmur present, no R/G, 2+ radial/1+ distal pulses present, 2+ bilateral lower extremity edema  ABDOMEN: Soft, nontender, distended/anasarca. Bowel sounds present. No organomegaly or mass.  EXTREMITIES: Moves all extremities, chronic vascular discoloration of bilateral lower extremities   NEUROLOGIC: Alert disoriented to situation at times, follows commands, PERRLA  SKIN: Scattered ecchymosis   Labs/imaging that I havepersonally reviewed  (right click and "Reselect all SmartList Selections" daily)  Labs reviewed 01/29/21   Labs   CBC: Recent Labs  Lab 01/27/21 0447 01/29/21 0602  WBC 6.9 10.0  NEUTROABS  --  8.7*  HGB 12.8* 12.1*  HCT 37.0* 35.1*  MCV 87.7 86.0  PLT 92* 104*     Basic Metabolic Panel: Recent Labs  Lab 01/26/21 0524 01/27/21 0447 01/27/21 1205 01/27/21 2242 01/28/21 0416 01/28/21 1900 01/29/21 0029 01/29/21 0602  NA 126* 126* 126* 127* 126* 124* 124* 124*  K 3.7 3.5 4.0 4.0 3.8  --   --  3.7  CL 82* 84* 82* 81* 81*  --   --  81*  CO2 33* 32 32 35* 34*  --   --  33*  GLUCOSE 112* 102* 104* 142* 172*  --   --  134*  BUN 37* 53* 59* 66* 69*  --   --  83*  CREATININE 1.15 1.24 1.49* 1.38* 1.48*  --   --  1.89*  CALCIUM 8.3* 7.9* 8.0* 8.1* 8.1*  --   --  8.1*  MG 2.0 2.2  --  2.5* 2.3  --   --  2.3   PHOS  --   --   --   --   --   --   --  5.0*    GFR: Estimated Creatinine Clearance: 47.3 mL/min (A) (by C-G formula based on SCr of 1.89 mg/dL (H)). Recent Labs  Lab 01/27/21 0447 01/28/21 1118 01/29/21 0602  WBC 6.9  --  10.0  LATICACIDVEN  --  1.3  --      Liver Function Tests: No results for input(s): AST, ALT, ALKPHOS, BILITOT, PROT, ALBUMIN in the last 168 hours. No results for input(s): LIPASE, AMYLASE in the last 168 hours. No results for input(s): AMMONIA in the last 168 hours.  ABG    Component Value Date/Time   HCO3 37.8 (H) 01/28/2021 2220   O2SAT 83.1 01/29/2021 0500     Coagulation Profile: No results for input(s): INR, PROTIME in the last 168 hours.  Cardiac Enzymes: No results for input(s): CKTOTAL, CKMB, CKMBINDEX, TROPONINI in the last 168 hours.  HbA1C: No results found for: HGBA1C  CBG: No results for input(s): GLUCAP in the last 168 hours.  Review of Systems:     Past Medical History  He,  has a past medical history of Depression, Hypertension, and  Hypothyroidism.   Surgical History    Past Surgical History:  Procedure Laterality Date   COLONOSCOPY WITH PROPOFOL N/A 03/03/2015   Procedure: COLONOSCOPY WITH PROPOFOL;  Surgeon: Manya Silvas, MD;  Location: [facility] ENDOSCOPY;  Service: Endoscopy;  Laterality: N/A;   EYE SURGERY     RIGHT/LEFT HEART CATH AND CORONARY ANGIOGRAPHY N/A 01/27/2021   Procedure: RIGHT/LEFT HEART CATH AND CORONARY ANGIOGRAPHY;  Surgeon: Nelva Bush, MD;  Location: [place] [facility] LAB;  Service: Cardiovascular;  Laterality: N/A;     Social History   reports that he has quit smoking. He has quit using smokeless tobacco.  His smokeless tobacco use included chew. He reports that he does not drink alcohol.   Family History   His family history is not on file.   Allergies No Known Allergies   Home Medications  Prior to Admission medications   Medication Sig Start Date End Date Taking? Authorizing  Provider  ketoconazole (NIZORAL) 2 % shampoo Apply topically. 03/18/20  Yes [provider]  mirtazapine (REMERON) 30 MG tablet Take 30 mg by mouth at bedtime. 01/15/21  Yes [provider]  polyethylene glycol powder (GLYCOLAX/MIRALAX) 17 GM/SCOOP powder Take as directed for colonoscopy prep. 01/14/15  Yes [provider]  traZODone (DESYREL) 100 MG tablet Take 1 tablet by mouth at bedtime. 01/02/21  Yes [provider]  triamcinolone (KENALOG) 0.1 % Apply topically. 01/08/20  Yes [provider]  Ascorbic Acid (VITAMIN C) 100 MG tablet Take 100 mg by mouth daily.    [provider]  aspirin 81 MG chewable tablet Chew by mouth.    [provider]  cholecalciferol (VITAMIN D) 400 UNITS TABS tablet Take 400 Units by mouth.    [provider]  Cyanocobalamin (VITAMIN B 12 PO) Take by mouth.    [provider]  folic acid (FOLVITE) 1 MG tablet Take 1 mg by mouth daily.    [provider]  furosemide (LASIX) 20 MG tablet Take 20 mg by mouth daily.    [provider]  levothyroxine (SYNTHROID, LEVOTHROID) 50 MCG tablet Take 50 mcg by mouth daily before breakfast.    [provider]  lisinopril (PRINIVIL,ZESTRIL) 40 MG tablet Take 40 mg by mouth daily.    [provider]  pravastatin (PRAVACHOL) 40 MG tablet Take 40 mg by mouth daily.    [provider]  Scheduled Meds:  aspirin  81 mg Oral Daily   Chlorhexidine Gluconate Cloth  6 each Topical Daily   diphenhydrAMINE  25 mg Oral QHS   enoxaparin (LOVENOX) injection  0.5 mg/kg Subcutaneous Q24H   levothyroxine  75 mcg Oral Q0600   melatonin  5 mg Oral QHS   pravastatin  40 mg Oral Daily   sodium chloride flush  3 mL Intravenous Q12H   sodium chloride flush  3 mL Intravenous Q12H   Continuous Infusions:  sodium chloride     amiodarone Stopped (01/28/21 0459)   milrinone 0.125 mcg/kg/min (01/28/21 2358)   norepinephrine  (LEVOPHED) Adult infusion 8 mcg/min (01/29/21 9381)   PRN Meds:.sodium chloride, acetaminophen **OR** acetaminophen, magnesium hydroxide, ondansetron **OR** ondansetron (ZOFRAN) IV, sodium chloride flush, traZODone  Active Hospital Problem list   Acute hypoxic respiratory failure Acute on chronic diastolic CHF AKI Hyponatremia Hypothyroidism Hyperlipidemia  Assessment & Plan:  Acute Hypoxic Respiratory Failure secondary to Acute Decompensated HFrEF  -Supplemental O2 as needed to maintain O2 saturations 88 to 92% -High risk for intubation -Follow intermittent ABG and chest x-ray as  needed -As needed bronchodilators  Acute on chronic HFrEF, newly reduced EF of 25-30% by Echo on 01/19/21 Frequent runs of ventricular tachycardia and PVC's possibly secondary to milrinone gtt  Cardiogenic Shock  Anasarca Hx: Hypertension, HLD status post Right and left heart catheterization with no significant CAD -Continuous cardiac monitoring -Continue aspirin and pravastatin  -Cardiology consulted appreciate input~per Dr. Donivan Scull recommendations on10/20 continue milrinone gtt @0 .125 mcg/kg/min, start amiodarone gtt at 60 mg/hr initially for 6 hrs due to frequent ectopy, however if pt becomes bradycardic decrease amiodarone gtt to 30 mg/hr  -Hold outpatient antihypertensives   -Strict I&Os   AKI Hypotonic Hyponatremia Hypervolemic likely in the setting of volume overload as above -Monitor I&O's / urinary output -Follow BMP -Ensure adequate renal perfusion -Avoid nephrotoxic agents as able -Replace electrolytes as indicated  Hypothyroidism -TSH elevated  9.059, Normal T3 and slightly elevated T4 -Continue synthroid   Best practice:  Diet:  Oral Pain/Anxiety/Delirium protocol (if indicated): No VAP protocol (if indicated): Not indicated DVT prophylaxis: LMWH GI prophylaxis: H2B Glucose control:  SSI No Central venous access:  Yes, and it is still needed Arterial line:  N/A Foley: Yes  and still needed  Mobility:  bed rest  PT consulted: N/A Last date of multidisciplinary goals of care discussion [10/20] Code Status:  full code Disposition: ICU   Updated pts son [place] and pt along with pts son's girlfriend regarding pt prognosis and current plan of care.  All questions were answered.    Critical care time: 40 minutes     Rosilyn Mings, [place] Pager [phone] (please enter 7 digits) PCCM Consult Pager [phone] (please enter 7 digits)  .

## 2021-01-29 NOTE — H&P (Signed)
NAME:  Anthony Weber, MRN:  [id number], DOB:  Mar 17, 1946, LOS: 0 ADMISSION DATE:  01/29/2021, CONSULTATION DATE:  01/29/21 REFERRING MD:  Mortimer Fries -[facility] CCM, CHIEF COMPLAINT:  Cardiogenic shock   History of Present Illness:  75 yo M PMH morbid obesity, HTN, HLD, hypothyroidism, HFrEF who presented to [facility] ED 10/9 with CC BLE swelling. He reportedly gained 15lb over 1wk prior to presentation, had incr DOE. He was admitted to hospitalist service for acute heart failure exacerbation. Cardiology was consulted 10/10 and recommended diuresis, lisonopril and repeat ECHO which revealed LVEF 25-30% with indeterminate LV diastolic parameters and poorly visualized RV systolic function.  He received ongoing medication optimization and went for [facility] 10/18 which showed no significant CAD, moderately elevated L heart and PA pressures, severely elevated R Heart pressures. He was transferred to the ICU 10/18 and started on a milrinone infusion for cardiogenic shock.   On 10/20 the pt was started on a lasix gtt for hypoxia in setting of cardiogenic shock and a plan was made to transfer the patient to [place] for further management and care.   Labs on admission to [facility]: BNP 951 hsTrop 20  Albumin 3.4 Protein 6.2   Labs on admission to [facility]:  Na 124 K 3.7 Cl 81 CO2 33 Cr 1.89  CBC pretty normal except plt 104  PCT 0.74 VBG 7.47 / 52/49  Pertinent  Medical History  HTN HFrEF HLD Morbid obesity Hypothyroidism   Significant Hospital Events: Including procedures, antibiotic start and stop dates in addition to other pertinent events   10/9 admitted to [facility] with likely HF exacerbation. Diuresed  10/10 cards consulted for HF recs  10/18 R/LHC without significant CAD, but had incr L heart, PA, and R heart pressures. Transferred to ICU, started on milronone 10/20 Transferred to [facility]  Interim History / Subjective:  Pt arrived to [facility], no acute events  Objective   There were no vitals  taken for this visit. CVP:  [3 mmHg-19 mmHg] 17 mmHg     No intake or output data in the 24 hours ending 01/29/21 1955  There were no vitals filed for this visit.   Exam per attending note below.   Resolved Hospital Problem list     Assessment & Plan:   Acute respiratory failure with hypoxia -suspect in setting of cardiogenic shock, AKI, possible developing bibasilar airspace disease  P -on lasix gtt  -O2 support as needed -may need bipap -started Cefepime, check procal and respiratory culture  Cardiogenic shock  Acute on chronic HFrEF  -coox 83 on 9 NE, 0.125 milrinone  -? Possible other source of shock contributing  -severe RH failure on echo P -milronone, NE, amiodarone -hold lasix gtt overnight until HF evaluation -trend -consult adv HF, were notified of patient's transfer earlier  NSVT P -mag goal 2 K goal 4  -ICU monitoring  -conitnue amio gtt   AKI  P -trend renal indices, output on lasix gtt  -avoid nephrotoxins  Hyponatremia -suspect hypervolemic  P -trend metabolic panel -urine and serum osms    Urinary Tract Infection No leukocytosis and afebrile P: -start Cefepime   Best Practice (right click and "Reselect all SmartList Selections" daily)   Diet/type: NPO DVT prophylaxis: prophylactic heparin  GI prophylaxis: N/A Lines: Central line and yes and it is still needed Foley:  Yes, and it is still needed Code Status:  full code Last date of multidisciplinary goals of care discussion [10/20, met with palliative care]  Labs   CBC: Recent Labs  Lab 01/27/21 0447 01/29/21 0602  WBC 6.9 10.0  NEUTROABS  --  8.7*  HGB 12.8* 12.1*  HCT 37.0* 35.1*  MCV 87.7 86.0  PLT 92* 104*     Basic Metabolic Panel: Recent Labs  Lab 01/26/21 0524 01/27/21 0447 01/27/21 1205 01/27/21 2242 01/28/21 0416 01/28/21 1900 01/29/21 0029 01/29/21 0602 01/29/21 1124  NA 126* 126* 126* 127* 126* 124* 124* 124* 122*  K 3.7 3.5 4.0 4.0 3.8  --   --   3.7  --   CL 82* 84* 82* 81* 81*  --   --  81*  --   CO2 33* 32 32 35* 34*  --   --  33*  --   GLUCOSE 112* 102* 104* 142* 172*  --   --  134*  --   BUN 37* 53* 59* 66* 69*  --   --  83*  --   CREATININE 1.15 1.24 1.49* 1.38* 1.48*  --   --  1.89*  --   CALCIUM 8.3* 7.9* 8.0* 8.1* 8.1*  --   --  8.1*  --   MG 2.0 2.2  --  2.5* 2.3  --   --  2.3  --   PHOS  --   --   --   --   --   --   --  5.0*  --     GFR: Estimated Creatinine Clearance: 48.2 mL/min (A) (by C-G formula based on SCr of 1.89 mg/dL (H)). Recent Labs  Lab 01/27/21 0447 01/28/21 1118 01/29/21 0602 01/29/21 1124  PROCALCITON  --   --   --  0.74  WBC 6.9  --  10.0  --   LATICACIDVEN  --  1.3  --   --      Liver Function Tests: No results for input(s): AST, ALT, ALKPHOS, BILITOT, PROT, ALBUMIN in the last 168 hours. No results for input(s): LIPASE, AMYLASE in the last 168 hours. No results for input(s): AMMONIA in the last 168 hours.  ABG    Component Value Date/Time   HCO3 37.8 (H) 01/28/2021 2220   O2SAT 83.1 01/29/2021 0500      Coagulation Profile: No results for input(s): INR, PROTIME in the last 168 hours.  Cardiac Enzymes: No results for input(s): CKTOTAL, CKMB, CKMBINDEX, TROPONINI in the last 168 hours.  HbA1C: No results found for: HGBA1C  CBG: Recent Labs  Lab 01/29/21 1931  GLUCAP 170*    Review of Systems:    Review of Systems  Constitutional:  Positive for malaise/fatigue. Negative for chills, fever and weight loss.  Eyes: Negative.   Respiratory: Negative.    Cardiovascular:  Positive for orthopnea and leg swelling. Negative for chest pain and palpitations.  Gastrointestinal: Negative.    Past Medical History:  He,  has a past medical history of Depression, Hypertension, and Hypothyroidism.   Surgical History:   Past Surgical History:  Procedure Laterality Date   COLONOSCOPY WITH PROPOFOL N/A 03/03/2015   Procedure: COLONOSCOPY WITH PROPOFOL;  Surgeon: Manya Silvas,  MD;  Location: [facility] ENDOSCOPY;  Service: Endoscopy;  Laterality: N/A;   EYE SURGERY     RIGHT/LEFT HEART CATH AND CORONARY ANGIOGRAPHY N/A 01/27/2021   Procedure: RIGHT/LEFT HEART CATH AND CORONARY ANGIOGRAPHY;  Surgeon: Nelva Bush, MD;  Location: [place] CV LAB;  Service: Cardiovascular;  Laterality: N/A;     Social History:   reports that he has quit smoking. He has quit using  smokeless tobacco.  His smokeless tobacco use included chew. He reports that he does not drink alcohol.   Family History:  His family history is not on file.   Allergies No Known Allergies   Home Medications  Prior to Admission medications   Medication Sig Start Date End Date Taking? Authorizing Provider  ketoconazole (NIZORAL) 2 % shampoo Apply topically. 03/18/20  Yes [provider]  mirtazapine (REMERON) 30 MG tablet Take 30 mg by mouth at bedtime. 01/15/21  Yes [provider]  polyethylene glycol powder (GLYCOLAX/MIRALAX) 17 GM/SCOOP powder Take as directed for colonoscopy prep. 01/14/15  Yes [provider]  traZODone (DESYREL) 100 MG tablet Take 1 tablet by mouth at bedtime. 01/02/21  Yes [provider]  triamcinolone (KENALOG) 0.1 % Apply topically. 01/08/20  Yes [provider]  amiodarone (NEXTERONE PREMIX) 360-4.14 MG/200ML-% SOLN Inject 60 mg/hr into the vein continuous. 01/29/21   Flora Lipps, MD  amiodarone (NEXTERONE PREMIX) 360-4.14 MG/200ML-% SOLN Inject 30 mg/hr into the vein continuous. 01/29/21   Flora Lipps, MD  Ascorbic Acid (VITAMIN C) 100 MG tablet Take 100 mg by mouth daily.    [provider]  aspirin 81 MG chewable tablet Chew by mouth.    [provider]  cholecalciferol (VITAMIN D) 400 UNITS TABS tablet Take 400 Units by mouth.    [provider]  Cyanocobalamin (VITAMIN B 12 PO) Take by mouth.    [provider]  folic acid (FOLVITE) 1 MG tablet Take 1 mg by mouth daily.    [provider]  furosemide (LASIX) 20 MG tablet Take 20 mg by mouth daily.    [provider]  levothyroxine (SYNTHROID, LEVOTHROID) 50 MCG tablet Take 50 mcg by mouth daily before breakfast.    [provider]  lisinopril (PRINIVIL,ZESTRIL) 40 MG tablet Take 40 mg by mouth daily.    [provider]  milrinone (PRIMACOR) 20 MG/100 ML SOLN infusion Inject 0.0177 mg/min into the vein continuous. 01/29/21   Flora Lipps, MD  norepinephrine (LEVOPHED) 4-5 MG/250ML-% SOLN Inject 0-40 mcg/min into the vein continuous. 01/29/21   Flora Lipps, MD  pravastatin (PRAVACHOL) 40 MG tablet Take 40 mg by mouth daily.    [provider]     Critical care time: 32 minutes    Attending addendum Patient seen for ongoing shock, worsening multiorgan dysfunction.  Cath reviewed; patient states feeling no better since admission 01/18/21 although he has diuresed 10L.  He has worsening cough with green sputum.  No abdominal pain, nausea, vomiting.  On exam, obese, marked anasarca, his lungs are diminished, has rattling upper airway sounds.  His foley has pus coming from around it.  He is deconditioned and chronically ill appearing.  There are scattered bruises.  Labs show worsening white count, +UA, and higher SvO2 than previous.  BUN/Cr also up question intravascular depletion vs. Septic ATN.  CXR with L basilar infiltrate although he has had this in past unsure if this is a true pneumonia or chronic issue.  Overall seems to have sepsis (urine vs. Resp) on top of cardiogenic shock, would start abx, check cultures, change out foley.  Continue inotropes, monitor CVP/SvO2, hold further diuresis, work on pulmonary toileting.  CHF consult in AM.  Patient affirmed full code for now.   35 min cc time  Erskine Emery MD PCCM

## 2021-01-29 NOTE — Progress Notes (Signed)
Progress Note  Patient Name: Anthony Weber Date of Encounter: 01/29/2021  Primary Cardiologist: Ida Rogue, MD  Subjective   Sleepy - easily arousable.  Mouth breathing with upper airway secretions/rhonchi.  Denies chest pain.   Inpatient Medications    Scheduled Meds:  amiodarone  150 mg Intravenous Once   aspirin  81 mg Oral Daily   [START ON 01/30/2021] Chlorhexidine Gluconate Cloth  6 each Topical Q0600   diphenhydrAMINE  25 mg Oral QHS   docusate sodium  100 mg Oral Daily   enoxaparin (LOVENOX) injection  0.5 mg/kg Subcutaneous Q24H   levothyroxine  75 mcg Oral Q0600   melatonin  5 mg Oral QHS   pravastatin  40 mg Oral Daily   senna  1 tablet Oral Daily   sodium chloride flush  3 mL Intravenous Q12H   sodium chloride flush  3 mL Intravenous Q12H   Continuous Infusions:  sodium chloride     amiodarone 60 mg/hr (01/29/21 1100)   amiodarone     ampicillin-sulbactam (UNASYN) IV     furosemide (LASIX) 200 mg in dextrose 5% 100 mL (2mg /mL) infusion     milrinone 0.125 mcg/kg/min (01/29/21 1100)   norepinephrine (LEVOPHED) Adult infusion 10 mcg/min (01/29/21 1100)   PRN Meds: sodium chloride, acetaminophen **OR** acetaminophen, magnesium hydroxide, ondansetron **OR** ondansetron (ZOFRAN) IV, sodium chloride flush, traZODone   Vital Signs    Vitals:   01/29/21 1100 01/29/21 1115 01/29/21 1130 01/29/21 1145  BP: 105/60 102/69 (!) 93/41 (!) 91/47  Pulse: (!) 31 (!) 25 (!) 40 77  Resp: 20 (!) 23 (!) 22 19  Temp:      TempSrc:      SpO2: 94% 95% 93% 94%  Weight:   (!) 146.4 kg   Height:        Intake/Output Summary (Last 24 hours) at 01/29/2021 1200 Last data filed at 01/29/2021 1100 Gross per 24 hour  Intake 1041.47 ml  Output 1390 ml  Net -348.53 ml   Filed Weights   01/26/21 1030 01/27/21 0658 01/29/21 1130  Weight: (!) 136.9 kg (!) 141.8 kg (!) 146.4 kg    Physical Exam   GEN: morbidly obese, in no acute distress.  HEENT: Grossly normal.   Neck: Supple, obese, difficult to gauge JVP, no carotid bruits, or masses. Cardiac: Irreg, distant, freq ectopy, no murmurs, rubs.  No clubbing, cyanosis.  2+ bilat LE edema to mid-calves.  Radials 2+, DP/PT 2+ and equal bilaterally.  Respiratory:  Respirations regular and unlabored, coarse breath sounds throughout. GI: Obese, semi-firm, nontender, BS + x 4. MS: no deformity or atrophy. Skin: warm and dry, no rash. Neuro:  Somewhat somnolent but arouses easily.  Strength and sensation are intact. Psych: AAOx3.  Normal affect.  Labs    Chemistry Recent Labs  Lab 01/27/21 2242 01/28/21 0416 01/28/21 1900 01/29/21 0029 01/29/21 0602 01/29/21 1124  NA 127* 126*   < > 124* 124* 122*  K 4.0 3.8  --   --  3.7  --   CL 81* 81*  --   --  81*  --   CO2 35* 34*  --   --  33*  --   GLUCOSE 142* 172*  --   --  134*  --   BUN 66* 69*  --   --  83*  --   CREATININE 1.38* 1.48*  --   --  1.89*  --   CALCIUM 8.1* 8.1*  --   --  8.1*  --  GFRNONAA 53* 49*  --   --  36*  --   ANIONGAP 11 11  --   --  10  --    < > = values in this interval not displayed.     Hematology Recent Labs  Lab 01/27/21 0447 01/29/21 0602  WBC 6.9 10.0  RBC 4.22 4.08*  HGB 12.8* 12.1*  HCT 37.0* 35.1*  MCV 87.7 86.0  MCH 30.3 29.7  MCHC 34.6 34.5  RDW 14.5 13.7  PLT 92* 104*    Cardiac Enzymes  Recent Labs  Lab 01/18/21 1654 01/19/21 1238  TROPONINIHS 20* 22*      BNP Recent Labs  Lab 01/27/21 0447  BNP 681.0*     Lipids  Lab Results  Component Value Date   CHOL 101 01/19/2021   HDL 40 (L) 01/19/2021   LDLCALC 51 01/19/2021   TRIG 49 01/19/2021   CHOLHDL 2.5 01/19/2021   Lab Results  Component Value Date   TSH 4.610 (H) 01/27/2021     Radiology    DG Chest 1 View  Result Date: 01/27/2021 CLINICAL DATA:  Central line placement EXAM: CHEST  1 VIEW COMPARISON:  01/18/2021 FINDINGS: Interval placement of right IJ central venous catheter with tip over the SVC. No visible right  pneumothorax. Cardiomegaly with vascular congestion. Probable small left effusion with left basilar airspace disease. IMPRESSION: 1. Right IJ central venous catheter tip over the SVC. No pneumothorax 2. Cardiomegaly with mild central congestion 3. Possible small left effusion with mild airspace disease at left base. Electronically Signed   By: Donavan Foil M.D.   On: 01/27/2021 22:00   Telemetry    Sinus w/ freq PVCs, brief runs of NSVT, PACs, and brief runs of atrial tachycardia - Personally Reviewed  Cardiac Studies   Echo 01/19/2021  1. Left ventricular ejection fraction, by estimation, is 25 to 30%. The  left ventricle has severely decreased function. Left ventricular  endocardial border not optimally defined to evaluate regional wall motion.  Left ventricular diastolic parameters are   indeterminate.   2. Right ventricular systolic function was not well visualized. The right  ventricular size is normal. Tricuspid regurgitation signal is inadequate  for assessing PA pressure.   3. The mitral valve was not well visualized. No evidence of mitral valve  regurgitation. No evidence of mitral stenosis.   4. The aortic valve was not well visualized. Aortic valve regurgitation  is not visualized. No aortic stenosis is present.   5. Technically difficult study due to poor echo windows.      Very limited study even with contrast agent. Consider alternative  testing.  _____________   Cardiac Catheterization  10.18.2022  Diagnostic Dominance: Right   Right Heart Pressures RA (mean): 20 mmHg RV (S/EDP): 50/23 mmHg PA (S/D, mean): 48/29 (35) mmHg PCWP (mean): 25 mmHg  Ao sat: 99% PA sat: 56%  Fick CO: 3.7 L/min Fick CI: 1.5 L/min/m^2  _____________   Patient Profile     75 y.o. male w/ a h/o HTN, HL, and obesity, who was admitted 10/9 w/ progressive dyspnea and edema, and found to have an EF of 25-30%.  RHC w/ elevated filling pressures and minimal nonobs CAD.  Course complicated  by freq ventricular ect0py - limiting milrinone dose and req amio, hypotension req levophed, and worsening renal fxn.  Assessment & Plan    1.  Acute HFrEF/NICM/Cardiogenic shock:  Admitted 10/9 w/ volume overload.  EF 25-30% by echo.  [place] on 10/18 w/ nonobs  CAD and elevated filling pressures (RA 20, PA 48/29, PCWP 25) and low output (CO/CI 3.7/1.5).  He was placed on milrinone, initially @ 0.25 mcg but dose reduced to 0.125 mcg due to hypotension and more freq ventricular ectopy.   Remains on milrinone @ 0.125 mcg/kg/min.  Ongoing freq atrial and ventricular ectopy req amio.  Co-ox 81.  CVP 17-18.  Remains volume overloaded on exam and wt up ~ 5 kg over past 3 days.  Only minus 133ml yesterday.  Last dose of lasix 10/17 (on hold for hypotension, rising BUN/creat - 83/1.89 this AM).  F/u CXR this AM in the setting of increase upper airway noise w/ L>R bibasilar airspace dzs, which is minimally changed from 10/18 CXR.  Discussed w/ Drs. Gollan and Bensimhon.  Ideally would like to have him eval by Dr. Haroldine Laws and advanced CHF team @ [facility] - ? Potential benefit of impella/IABP. If CCM to CCM transfer not feasible, advanced CHF team will take Mr. Tobin Chad on their service.  No ? blocker 2/2 hypotension and low-output.  No acei/arb/arni/mra 2/2 hypotension.    2.  NSVT:  Ongoing and freq runs of NSVT and more prolonged episodes of asymptomatic VT on tele.  Re-bolused w/ amio this AM.  Dose has been limited to 30mg /hr due to bradycardia previously.  Mg 2.3. K 3.7 - will supplement.  No ? blocker in the setting of low output and hypotension.  3.  AKI:  BUN/Creat rising in setting of above.  Likely multifactorial in the setting of low output, hypotension, and perhaps CIN (though only 17 mls of contrast used @ time of cath).  Will rechallenge w/ lasix via continuous infusion given volume overload.  Follow closely w/ low threshold to involve nephrology.  4.  Hypotension/Shock:  remains on levophed.  Abx  added to regimen this AM by CCM out of concern for possible developing PNA.  5.  Hypokalemia:  Low-nl @ 3.7.  W/ ectopy will supp.  6.  HL:  cont statin rx.  7.  Hypothyroidism:  on replacement.  TSH wnl.  Signed, Murray Hodgkins, NP  01/29/2021, 12:00 PM    For questions or updates, please contact   Please consult [email or web address] for contact info under Cardiology/STEMI.

## 2021-01-29 NOTE — Consult Note (Addendum)
Consultation Note Date: 01/29/2021   Patient Name: Anthony Weber  DOB: Sep 02, 1945  MRN: [id number]  Age / Sex: 75 y.o., male  PCP: Albina Billet, MD Referring Physician: Flora Lipps, MD  Reason for Consultation: Establishing goals of care  HPI/Patient Profile: Anthony Weber is a 75 y.o. Caucasian male with medical history significant for depression, hypertension, diastolic CHF and hypothyroidism, presented to the emergency room with acute onset of worsening lower extremity edema with associated abdominal distention, recent dyspnea at rest and on exertion.  He admitted to orthopnea and paroxysmal nocturnal dyspnea.  He gained 15 pounds over the last couple of weeks.  No fever or chills.  He denies any cough or wheezing.  No dysuria, oliguria or hematuria or flank pain.  No chest pain or palpitations.  No bleeding diathesis.  Clinical Assessment and Goals of Care: Patient is resting in bed. Wet cough noted. His wife is at bedside. 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 She states he had been gaining weight and had a large amount of edema.   We discussed his diagnoses, and how they affect each other. She states she understands his status, and states he has continued to decline since beginning aggressive diuresis. She has been well updated. She states cardiology is looking to transfer her to [facility] and she is amenable to this plan.    CCM entered conversation to confirm plans to send him to [facility]. She states she is willing to take the risk of death in transport for him to be moved there for care. She is very grateful for this information and states at this time, she wants all care possible. She is amenable for PMT to follow there to continue conversations. She understands his poor prognosis if he does not improve.    SUMMARY OF RECOMMENDATIONS   Recommend PMT  to follow on [facility].        Primary Diagnoses: Present on Admission: **None**   I have reviewed the medical record, interviewed the patient and family, and examined the patient. The following aspects are pertinent.  Past Medical History:  Diagnosis Date   Depression    Hypertension    Hypothyroidism    Social History   Socioeconomic History   Marital status: Married    Spouse name: Ariz Terrones   Number of children: Not on file   Years of education: Not on file   Highest education level: Not on file  Occupational History   Not on file  Tobacco Use   Smoking status: Former   Smokeless tobacco: Former    Types: Nurse, children's Use: Unknown  Substance and Sexual Activity   Alcohol use: No   Drug use: Not on file   Sexual activity: Not on file  Other Topics Concern   Not on file  Social History Narrative   Not on file   Social Determinants of Health   Financial Resource Strain: Not on file  Food Insecurity: Not on file  Transportation Needs: Not on file  Physical Activity: Not on file  Stress: Not on file  Social Connections: Not on file   History reviewed. No pertinent family history. Scheduled Meds:  aspirin  81 mg Oral Daily   [START ON 01/30/2021] Chlorhexidine Gluconate Cloth  6 each Topical Q0600   diphenhydrAMINE  25 mg Oral QHS   docusate sodium  100 mg Oral Daily   enoxaparin (LOVENOX) injection  0.5 mg/kg Subcutaneous Q24H   levothyroxine  75 mcg Oral Q0600   melatonin  5 mg Oral QHS   pravastatin  40 mg Oral Daily   senna  1 tablet Oral Daily   sodium chloride flush  3 mL Intravenous Q12H   sodium chloride flush  3 mL Intravenous Q12H   Continuous Infusions:  sodium chloride     amiodarone 30 mg/hr (01/29/21 1445)   ampicillin-sulbactam (UNASYN) IV     furosemide (LASIX) 200 mg in dextrose 5% 100 mL (2mg /mL) infusion 4 mg/hr (01/29/21 1500)   milrinone 0.125 mcg/kg/min (01/29/21 1500)   norepinephrine (LEVOPHED) Adult  infusion 9 mcg/min (01/29/21 1500)   PRN Meds:.sodium chloride, acetaminophen **OR** acetaminophen, magnesium hydroxide, ondansetron **OR** ondansetron (ZOFRAN) IV, sodium chloride flush, traZODone Medications Prior to Admission:  Prior to Admission medications   Medication Sig Start Date End Date Taking? Authorizing Provider  ketoconazole (NIZORAL) 2 % shampoo Apply topically. 03/18/20  Yes [provider]  mirtazapine (REMERON) 30 MG tablet Take 30 mg by mouth at bedtime. 01/15/21  Yes [provider]  polyethylene glycol powder (GLYCOLAX/MIRALAX) 17 GM/SCOOP powder Take as directed for colonoscopy prep. 01/14/15  Yes [provider]  traZODone (DESYREL) 100 MG tablet Take 1 tablet by mouth at bedtime. 01/02/21  Yes [provider]  triamcinolone (KENALOG) 0.1 % Apply topically. 01/08/20  Yes [provider]  amiodarone (NEXTERONE PREMIX) 360-4.14 MG/200ML-% SOLN Inject 60 mg/hr into the vein continuous. 01/29/21   Flora Lipps, MD  amiodarone (NEXTERONE PREMIX) 360-4.14 MG/200ML-% SOLN Inject 30 mg/hr into the vein continuous. 01/29/21   Flora Lipps, MD  Ascorbic Acid (VITAMIN C) 100 MG tablet Take 100 mg by mouth daily.    [provider]  aspirin 81 MG chewable tablet Chew by mouth.    [provider]  cholecalciferol (VITAMIN D) 400 UNITS TABS tablet Take 400 Units by mouth.    [provider]  Cyanocobalamin (VITAMIN B 12 PO) Take by mouth.    [provider]  folic acid (FOLVITE) 1 MG tablet Take 1 mg by mouth daily.    [provider]  furosemide (LASIX) 20 MG tablet Take 20 mg by mouth daily.    [provider]  levothyroxine (SYNTHROID, LEVOTHROID) 50 MCG tablet Take 50 mcg by mouth daily before breakfast.    [provider]  lisinopril (PRINIVIL,ZESTRIL) 40 MG tablet Take 40 mg by mouth daily.    [provider]  milrinone (PRIMACOR) 20 MG/100 ML SOLN infusion Inject  0.0177 mg/min into the vein continuous. 01/29/21   Flora Lipps, MD  norepinephrine (LEVOPHED) 4-5 MG/250ML-% SOLN Inject 0-40 mcg/min into the vein continuous. 01/29/21   Flora Lipps, MD  pravastatin (PRAVACHOL) 40 MG tablet Take 40 mg by mouth daily.    [provider]   No Known Allergies Review of Systems  Unable to perform ROS  Physical Exam Pulmonary:     Comments: Wet cough.  Neurological:     Mental Status: He is alert.  Vital Signs: BP (!) 111/46   Pulse 78   Temp 98.2 F (36.8 C) (Oral)   Resp (!) 21   Ht 5\' 10"  (1.778 m)   Wt (!) 146.4 kg   SpO2 92%   BMI 46.31 kg/m  Pain Scale: 0-10 POSS *See Group Information*: 1-Acceptable,Awake and alert Pain Score: 0-No pain   SpO2: SpO2: 92 % O2 Device:SpO2: 92 % O2 Flow Rate: .O2 Flow Rate (L/min): 3 L/min  IO: Intake/output summary:  Intake/Output Summary (Last 24 hours) at 01/29/2021 1558 Last data filed at 01/29/2021 1500 Gross per 24 hour  Intake 1352.64 ml  Output 1400 ml  Net -47.36 ml    LBM: Last BM Date: 01/25/21 Baseline Weight: Weight: (!) 154.2 kg Most recent weight: Weight: (!) 146.4 kg      Time In: 3:30 Time Out: 4:00 Time Total: 30 min Greater than 50%  of this time was spent counseling and coordinating care related to the above assessment and plan.  Signed by: Asencion Gowda, NP   Please contact Palliative Medicine Team phone at [phone] for questions and concerns.  For individual provider: See Shea Evans

## 2021-01-30 DIAGNOSIS — G9341 Metabolic encephalopathy: Secondary | ICD-10-CM

## 2021-01-30 DIAGNOSIS — J9621 Acute and chronic respiratory failure with hypoxia: Secondary | ICD-10-CM

## 2021-01-30 DIAGNOSIS — J9602 Acute respiratory failure with hypercapnia: Secondary | ICD-10-CM | POA: Diagnosis not present

## 2021-01-30 DIAGNOSIS — A419 Sepsis, unspecified organism: Secondary | ICD-10-CM

## 2021-01-30 DIAGNOSIS — R6521 Severe sepsis with septic shock: Secondary | ICD-10-CM | POA: Diagnosis not present

## 2021-01-30 DIAGNOSIS — J9601 Acute respiratory failure with hypoxia: Secondary | ICD-10-CM

## 2021-01-30 DIAGNOSIS — J9622 Acute and chronic respiratory failure with hypercapnia: Secondary | ICD-10-CM

## 2021-01-30 DIAGNOSIS — R7881 Bacteremia: Secondary | ICD-10-CM | POA: Diagnosis not present

## 2021-01-30 LAB — BLOOD CULTURE ID PANEL (REFLEXED) - BCID2

## 2021-01-30 LAB — POCT I-STAT 7, (LYTES, BLD GAS, ICA,H+H)
Acid-Base Excess: 6 mmol/L — ABNORMAL HIGH (ref 0.0–2.0)
Bicarbonate: 34.4 mmol/L — ABNORMAL HIGH (ref 20.0–28.0)
Calcium, Ion: 1.11 mmol/L — ABNORMAL LOW (ref 1.15–1.40)
HCT: 39 % (ref 39.0–52.0)
Hemoglobin: 13.3 g/dL (ref 13.0–17.0)
O2 Saturation: 97 %
Patient temperature: 97.5
Potassium: 3.9 mmol/L (ref 3.5–5.1)
Sodium: 121 mmol/L — ABNORMAL LOW (ref 135–145)
TCO2: 36 mmol/L — ABNORMAL HIGH (ref 22–32)
pCO2 arterial: 67.6 mmHg (ref 32.0–48.0)
pH, Arterial: 7.312 — ABNORMAL LOW (ref 7.350–7.450)
pO2, Arterial: 99 mmHg (ref 83.0–108.0)

## 2021-01-30 LAB — EXPECTORATED SPUTUM ASSESSMENT W GRAM STAIN, RFLX TO RESP C

## 2021-01-30 LAB — COMPREHENSIVE METABOLIC PANEL
ALT: 31 U/L (ref 0–44)
AST: 30 U/L (ref 15–41)
Albumin: 2.4 g/dL — ABNORMAL LOW (ref 3.5–5.0)
Alkaline Phosphatase: 66 U/L (ref 38–126)
Anion gap: 10 (ref 5–15)
BUN: 85 mg/dL — ABNORMAL HIGH (ref 8–23)
CO2: 32 mmol/L (ref 22–32)
Calcium: 8 mg/dL — ABNORMAL LOW (ref 8.9–10.3)
Chloride: 80 mmol/L — ABNORMAL LOW (ref 98–111)
Creatinine, Ser: 2.08 mg/dL — ABNORMAL HIGH (ref 0.61–1.24)
GFR, Estimated: 32 mL/min — ABNORMAL LOW (ref >60)
Glucose, Bld: 149 mg/dL — ABNORMAL HIGH (ref 70–99)
Potassium: 4 mmol/L (ref 3.5–5.1)
Sodium: 122 mmol/L — ABNORMAL LOW (ref 135–145)
Total Bilirubin: 1.1 mg/dL (ref 0.3–1.2)
Total Protein: 5.8 g/dL — ABNORMAL LOW (ref 6.5–8.1)

## 2021-01-30 LAB — HEMOGLOBIN A1C
Hgb A1c MFr Bld: 6.1 % — ABNORMAL HIGH (ref 4.8–5.6)
Mean Plasma Glucose: 128.37 mg/dL

## 2021-01-30 LAB — BASIC METABOLIC PANEL
Anion gap: 13 (ref 5–15)
BUN: 82 mg/dL — ABNORMAL HIGH (ref 8–23)
CO2: 29 mmol/L (ref 22–32)
Calcium: 8.1 mg/dL — ABNORMAL LOW (ref 8.9–10.3)
Chloride: 80 mmol/L — ABNORMAL LOW (ref 98–111)
Creatinine, Ser: 1.79 mg/dL — ABNORMAL HIGH (ref 0.61–1.24)
GFR, Estimated: 39 mL/min — ABNORMAL LOW (ref >60)
Glucose, Bld: 139 mg/dL — ABNORMAL HIGH (ref 70–99)
Potassium: 4.2 mmol/L (ref 3.5–5.1)
Sodium: 122 mmol/L — ABNORMAL LOW (ref 135–145)

## 2021-01-30 LAB — CBC
HCT: 36.9 % — ABNORMAL LOW (ref 39.0–52.0)
Hemoglobin: 12.4 g/dL — ABNORMAL LOW (ref 13.0–17.0)
MCH: 29.2 pg (ref 26.0–34.0)
MCHC: 33.6 g/dL (ref 30.0–36.0)
MCV: 87 fL (ref 80.0–100.0)
Platelets: 106 10*3/uL — ABNORMAL LOW (ref 150–400)
RBC: 4.24 MIL/uL (ref 4.22–5.81)
RDW: 14 % (ref 11.5–15.5)
WBC: 12.2 10*3/uL — ABNORMAL HIGH (ref 4.0–10.5)
nRBC: 0 % (ref 0.0–0.2)

## 2021-01-30 LAB — GLUCOSE, CAPILLARY
Glucose-Capillary: 128 mg/dL — ABNORMAL HIGH (ref 70–99)
Glucose-Capillary: 142 mg/dL — ABNORMAL HIGH (ref 70–99)
Glucose-Capillary: 139 mg/dL — ABNORMAL HIGH (ref 70–99)

## 2021-01-30 LAB — MAGNESIUM
Magnesium: 2.4 mg/dL (ref 1.7–2.4)
Magnesium: 2.4 mg/dL (ref 1.7–2.4)

## 2021-01-30 LAB — PHOSPHORUS: Phosphorus: 4.6 mg/dL (ref 2.5–4.6)

## 2021-01-30 LAB — COOXEMETRY PANEL
Carboxyhemoglobin: 1 % (ref 0.5–1.5)
Methemoglobin: 0.7 % (ref 0.0–1.5)
O2 Saturation: 79.5 %
Total hemoglobin: 12.4 g/dL (ref 12.0–16.0)

## 2021-01-30 LAB — MRSA NEXT GEN BY PCR, NASAL: MRSA by PCR Next Gen: NOT DETECTED

## 2021-01-30 MED ORDER — LIDOCAINE HCL (PF) 1 % IJ SOLN
5.0000 mL | Freq: Once | INTRAMUSCULAR | Status: AC
  Administered 2021-01-30: 5 mL via INTRADERMAL
  Filled 2021-01-30: qty 5

## 2021-01-30 MED ORDER — CEFAZOLIN SODIUM-DEXTROSE 2-4 GM/100ML-% IV SOLN
2.0000 g | Freq: Three times a day (TID) | INTRAVENOUS | Status: DC
  Filled 2021-01-30: qty 100

## 2021-01-30 MED ORDER — INSULIN ASPART 100 UNIT/ML IJ SOLN
0.0000 [IU] | Freq: Three times a day (TID) | INTRAMUSCULAR | Status: DC
  Administered 2021-01-30: 2 [IU] via SUBCUTANEOUS
  Administered 2021-01-31: 3 [IU] via SUBCUTANEOUS
  Administered 2021-01-31 – 2021-02-01 (×3): 2 [IU] via SUBCUTANEOUS
  Administered 2021-02-01 – 2021-02-02 (×2): 3 [IU] via SUBCUTANEOUS
  Administered 2021-02-02 – 2021-02-05 (×4): 2 [IU] via SUBCUTANEOUS
  Administered 2021-02-06: 5 [IU] via SUBCUTANEOUS
  Administered 2021-02-07: 3 [IU] via SUBCUTANEOUS
  Administered 2021-02-07 – 2021-02-09 (×3): 2 [IU] via SUBCUTANEOUS
  Administered 2021-02-09: 1 [IU] via SUBCUTANEOUS
  Administered 2021-02-10: 2 [IU] via SUBCUTANEOUS

## 2021-01-30 MED ORDER — CHLORHEXIDINE GLUCONATE CLOTH 2 % EX PADS
6.0000 | MEDICATED_PAD | Freq: Every day | CUTANEOUS | Status: DC
  Administered 2021-01-30 – 2021-02-11 (×12): 6 via TOPICAL

## 2021-01-30 MED ORDER — FUROSEMIDE 10 MG/ML IJ SOLN
5.0000 mg/h | INTRAVENOUS | Status: DC
  Administered 2021-01-30 – 2021-01-31 (×2): 15 mg/h via INTRAVENOUS
  Filled 2021-01-30 (×6): qty 20

## 2021-01-30 MED ORDER — POTASSIUM CHLORIDE 10 MEQ/50ML IV SOLN
10.0000 meq | INTRAVENOUS | Status: AC
  Administered 2021-01-30 (×4): 10 meq via INTRAVENOUS
  Filled 2021-01-30 (×4): qty 50

## 2021-01-30 MED ORDER — FUROSEMIDE 10 MG/ML IJ SOLN
80.0000 mg | Freq: Once | INTRAMUSCULAR | Status: AC
  Administered 2021-01-30: 80 mg via INTRAVENOUS
  Filled 2021-01-30: qty 8

## 2021-01-30 MED ORDER — NOREPINEPHRINE 16 MG/250ML-% IV SOLN
0.0000 ug/min | INTRAVENOUS | Status: DC
  Administered 2021-01-30 – 2021-01-31 (×2): 8 ug/min via INTRAVENOUS
  Filled 2021-01-30 (×2): qty 250

## 2021-01-30 NOTE — Progress Notes (Signed)
Orthopedic Tech Progress Note Patient Details:  Anthony Weber 1946-02-26 [id number]  Ortho Devices Type of Ortho Device: Louretta Parma boot Ortho Device/Splint Location: Bilateral Ortho Device/Splint Interventions: Ordered, Application   Post Interventions Patient Tolerated: Well  Anthony Weber A Jammal Sarr 01/30/2021, 3:49 PM

## 2021-01-30 NOTE — Consult Note (Addendum)
I have seen and examined the patient. I have personally reviewed the clinical findings, laboratory findings, microbiological data and imaging studies. The assessment and treatment plan was discussed with the Resident Delene Ruffini. I agree with her/his recommendations except following additions/corrections.  75 yo M PMH morbid obesity, HTN, HLD, hypothyroidism, HFrEF who presented to [facility] ED 10/9 with complaints of BLE swelling/DOE and weight gain for several weeks. Admitted for acute heart failure exacerbation and was managed with IV diuretics. Underwent [facility] 10/18 which showed no significant CAD, moderately elevated L heart and PA pressures, severely elevated R Heart pressures. He was transferred to the ICU 10/18 and started on a milrinone infusion for cardiogenic shock. On 10/20 the pt was started on a lasix gtt for hypoxia in setting of cardiogenic shock and transferred to [facility] for higher level of care.  Patient has been afebrile since admission except for a lowgrade temp of 100 on 10/16, no significant leukocytosis. BNP elevated to 900s. CVC placed on 10/18 Blood cx 10/20 2/2 sets MSSA in both sets ( 12 th day of hospitalization) TTE 10/10 ( poor study) with no evidence of vegetations or endocarditis, newly reduced EF of 25-30%  Imp: Cardiogenic shock ( primarily) with septic shock in the setting of MSSA bacteremia ( most likely this is Hospital acquired ( CLABSI) in the setting of CVC placed on 10/18)  Plan -On Vancomycin and cefepime currently. ICU team also concerned with PNA.and prefers to keep broad spectrum abtx for now pending respiratory cx/MRSA PCR.  Likely CLABSI 2/2 CVC with volume overload 2/2 cardiogenic shock.  Consider de-escalation to cefazolin  -Repeat 2 sets of blood cx 10/21 (ordered) -TEE when feasible -CVC Line holiday given staph bacteremia when feasible -Monitor CBC and BMP -Discussed with PCCM Dr Carlis Abbott  -Fluid/elctrolyte/resp status management per cardiology  and PCCM  Dr Juleen China is on call this weekend with questions. Otherwise, new ID team will follow on Monday.   [name], MD Infectious Disease Physician [facility] [address] [place], [place] [address] Phone: [phone]  Fax: [place] [facility] Infect[facility]    Date of Admission:  01/29/2021   Total days of antibiotics 2        Day 2 Cefepime        Day 2 Vancomycin               Reason for Consult: MSSA bacteremia    Referring Provider: Noemi Chapel, DO Primary Care Provider: Benita Stabile, MD  Assessment: # MSSA bacteremia  - Blood cultures 2/2 for MSSA.  - Currently receiving Vancomycin and Cefepime - Left and right heart cath and IJ central line placement 10/18.  Central line is likely source of infection as patient was not initially bacteremic on admission and may be serving as a nidus for infection.   - denies HA, CP, abd pain, no joint pain. No evidence of new murmer, no joint swelling or tenderness. No permanent prosthesis reported. No evidence of metastatic infection at this time. Prior TTE 01/19/21 showing no signs of endocarditis at that time. Given how recent TTE was performed, this is unlikely to show any changes if repeated, therefore, patient will need TEE to evaluate for signs of IE. - Thought content normal, no reported headache, moves extremities appropriately. Likely no need for CNS penetration with abx.  - He has been hypothermic, bradycardic, and hypotensive requiring blood pressure support. Afebrile otherwise, slight increae  in WBC from yesterday. He is alert and oriented at this time..   Plan: MSSA+ Bcx, can consider switching Cefepime to Cefazolin 2g Q8hr. Additionally, can discontinue Vanc if MRSA PCR negative.  Follow up culture sensitivities Follow up repeat blood cultures to ensure clearance Recommend TEE to evaluate for IE.  Recommend exchange of central line if possible  as it is likely serving as a nidus of infection.   Active Problems:   Cardiogenic shock (HCC)   Scheduled Meds:  Chlorhexidine Gluconate Cloth  6 each Topical Daily   furosemide  80 mg Intravenous Once   heparin  5,000 Units Subcutaneous Q8H   lidocaine (PF)  5 mL Intradermal Once   Continuous Infusions:  amiodarone 30 mg/hr (01/30/21 1000)   ceFEPime (MAXIPIME) IV 200 mL/hr at 01/30/21 1000   furosemide (LASIX) 200 mg in dextrose 5% 100 mL (2mg /mL) infusion     milrinone 0.121 mcg/kg/min (01/30/21 0700)   norepinephrine (LEVOPHED) Adult infusion 9 mcg/min (01/30/21 1000)   vancomycin 1,250 mg (01/29/21 2200)   PRN Meds:.acetaminophen, docusate sodium, ondansetron (ZOFRAN) IV, polyethylene glycol  HPI: Anthony Weber is a 75 y.o. male with a hx of HTN, HLD, hypothyroidism, HFrEF wh [facility]ed to [facility] ED 10/9 with BLE swelling and 15lb weight gain over the prior week. He was admitted for acute heart failure exacerbation. He underwnet right and left heart cath 10/18 and had IJ central line placed 10/18. Tranfserred to ICU 10/18 for milrinone. On 10/20, he was started on lasix gtt in the setting of cardiogenic shock. Blood cultures were obtained and positive for MSSA.    Review of Systems: Review of Systems  Constitutional:  Negative for chills and fever.  Respiratory:  Negative for cough.   Cardiovascular:  Negative for chest pain.  Gastrointestinal:  Negative for abdominal pain, diarrhea, nausea and vomiting.  Genitourinary:  Negative for dysuria and flank pain.  Musculoskeletal:  Negative for back pain, joint pain and neck pain.  Skin:  Negative for itching and rash.  Neurological: Negative.   Psychiatric/Behavioral: Negative.     Past Medical History:  Diagnosis Date   Depression    Hypertension    Hypothyroidism    Past Surgical History:  Procedure Laterality Date   COLONOSCOPY WITH PROPOFOL N/A 03/03/2015   Procedure: COLONOSCOPY WITH PROPOFOL;  Surgeon: Manya Silvas, MD;  Location: [place];  Service: Endoscopy;  Laterality: N/A;   EYE SURGERY     RIGHT/LEFT HEART CATH AND CORONARY ANGIOGRAPHY N/A 01/27/2021   Procedure: RIGHT/LEFT HEART CATH AND CORONARY ANGIOGRAPHY;  Surgeon: Nelva Bush, MD;  Location: [facility];  Service: Cardiovascular;  Laterality: N/A;    Social History   Tobacco Use   Smoking status: Former   Smokeless tobacco: Former    Types: Nurse, children's Use: Unknown  Substance Use Topics   Alcohol use: No    No family history on file. No Known Allergies  OBJECTIVE: Blood pressure 116/63, pulse 60, temperature (!) 96.7 F (35.9 C), resp. rate 19, weight (!) 147.8 kg, SpO2 96 %.  Physical Exam Constitutional:      Appearance: He is obese.  HENT:     Head: Normocephalic and atraumatic.  Neck:     Comments: Right IJ line without surrounding edema, erythema.  Cardiovascular:     Rate and Rhythm: Normal rate and regular rhythm.  Abdominal:     General: There is distension.     Palpations: Abdomen is soft. There  is no mass.     Tenderness: There is no abdominal tenderness. There is no right CVA tenderness, left CVA tenderness, guarding or rebound.  Musculoskeletal:        General: No swelling, tenderness or deformity. Normal range of motion.     Cervical back: Normal range of motion and neck supple.  Skin:    General: Skin is dry.  Neurological:     General: No focal deficit present.     Mental Status: He is alert and oriented to person, place, and time. Mental status is at baseline.  Psychiatric:        Behavior: Behavior normal.        Thought Content: Thought content normal.    Lab Results Lab Results  Component Value Date   WBC 12.2 (H) 01/30/2021   HGB 12.4 (L) 01/30/2021   HCT 36.9 (L) 01/30/2021   MCV 87.0 01/30/2021   PLT 106 (L) 01/30/2021    Lab Results  Component Value Date   CREATININE 2.08 (H) 01/30/2021   BUN 85 (H) 01/30/2021   NA 122 (L) 01/30/2021    K 4.0 01/30/2021   CL 80 (L) 01/30/2021   CO2 32 01/30/2021    Lab Results  Component Value Date   ALT 31 01/30/2021   AST 30 01/30/2021   ALKPHOS 66 01/30/2021   BILITOT 1.1 01/30/2021     Microbiology: Recent Results (from the past 240 hour(s))  CULTURE, BLOOD (ROUTINE X 2) w Reflex to ID Panel     Status: None (Preliminary result)   Collection Time: 01/29/21  2:00 PM   Specimen: BLOOD  Result Value Ref Range Status   Specimen Description BLOOD LEFT ANTECUBITAL  Final   Special Requests   Final    BOTTLES DRAWN AEROBIC AND ANAEROBIC Blood Culture adequate volume   Culture  Setup Time   Final    Organism ID to follow IN BOTH AEROBIC AND ANAEROBIC BOTTLES GRAM POSITIVE COCCI CRITICAL RESULT CALLED TO, READ BACK BY AND VERIFIED WITH: GREGG ABBOTT @0345  ON 01/30/21 SKL Performed at [facility], [address] [place], [place] [address]    Culture GRAM POSITIVE COCCI  Final   Report Status PENDING  Incomplete  Blood Culture ID Panel (Reflexed)     Status: Abnormal   Collection Time: 01/29/21  2:00 PM  Result Value Ref Range Status   Enterococcus faecalis NOT DETECTED NOT DETECTED Final   Enterococcus Faecium NOT DETECTED NOT DETECTED Final   Listeria monocytogenes NOT DETECTED NOT DETECTED Final   Staphylococcus species DETECTED (A) NOT DETECTED Final    Comment: CRITICAL RESULT CALLED TO, READ BACK BY AND VERIFIED WITH: GREGG ABBOTT @0345  ON 01/30/21 SKL    Staphylococcus aureus (BCID) DETECTED (A) NOT DETECTED Final    Comment: CRITICAL RESULT CALLED TO, READ BACK BY AND VERIFIED WITH: GREGG ABBOTT @0345  ON 01/30/21 SKL    Staphylococcus epidermidis NOT DETECTED NOT DETECTED Final   Staphylococcus lugdunensis NOT DETECTED NOT DETECTED Final   Streptococcus species NOT DETECTED NOT DETECTED Final   Streptococcus agalactiae NOT DETECTED NOT DETECTED Final   Streptococcus pneumoniae NOT DETECTED NOT DETECTED Final   Streptococcus pyogenes NOT DETECTED NOT  DETECTED Final   A.calcoaceticus-baumannii NOT DETECTED NOT DETECTED Final   Bacteroides fragilis NOT DETECTED NOT DETECTED Final   Enterobacterales NOT DETECTED NOT DETECTED Final   Enterobacter cloacae complex NOT DETECTED NOT DETECTED Final   Escherichia coli NOT DETECTED NOT DETECTED Final   Klebsiella aerogenes NOT DETECTED  NOT DETECTED Final   Klebsiella oxytoca NOT DETECTED NOT DETECTED Final   Klebsiella pneumoniae NOT DETECTED NOT DETECTED Final   Proteus species NOT DETECTED NOT DETECTED Final   Salmonella species NOT DETECTED NOT DETECTED Final   Serratia marcescens NOT DETECTED NOT DETECTED Final   Haemophilus influenzae NOT DETECTED NOT DETECTED Final   Neisseria meningitidis NOT DETECTED NOT DETECTED Final   Pseudomonas aeruginosa NOT DETECTED NOT DETECTED Final   Stenotrophomonas maltophilia NOT DETECTED NOT DETECTED Final   Candida albicans NOT DETECTED NOT DETECTED Final   Candida auris NOT DETECTED NOT DETECTED Final   Candida glabrata NOT DETECTED NOT DETECTED Final   Candida krusei NOT DETECTED NOT DETECTED Final   Candida parapsilosis NOT DETECTED NOT DETECTED Final   Candida tropicalis NOT DETECTED NOT DETECTED Final   Cryptococcus neoformans/gattii NOT DETECTED NOT DETECTED Final   Meth resistant mecA/C and MREJ NOT DETECTED NOT DETECTED Final    Comment: Performed at [facility], [place], [place] [address]  CULTURE, BLOOD (ROUTINE X 2) w Reflex to ID Panel     Status: None (Preliminary result)   Collection Time: 01/29/21  2:03 PM   Specimen: BLOOD  Result Value Ref Range Status   Specimen Description BLOOD BLOOD LEFT HAND  Final   Special Requests   Final    BOTTLES DRAWN AEROBIC AND ANAEROBIC Blood Culture adequate volume   Culture  Setup Time   Final    IN BOTH AEROBIC AND ANAEROBIC BOTTLES GRAM POSITIVE COCCI CRITICAL VALUE NOTED.  VALUE IS CONSISTENT WITH PREVIOUSLY REPORTED AND CALLED VALUE. Performed at [facility],  [address] [place], [place] [address]    Culture [facility] POSITIVE COCCI  Final   Report Status PENDING  Incomplete  Expectorated Sputum Assessment w Gram Stain, Rflx to Resp Cult     Status: None (Preliminary result)   Collection Time: 01/29/21  8:38 PM   Specimen: Sputum  Result Value Ref Range Status   Specimen Description SPUTUM  Final   Special Requests NONE  Final   Sputum evaluation   Final    Sputum specimen not acceptable for testing.  Please recollect.   RESULT CALLED TO, READ BACK BY AND VERIFIED WITH: RN KOBE 01/30/21@00 :36 Performed at [facility], [place] [address], [place], [place] [address]    Report Status PENDING  Incomplete   Pertinent Imaging DG Chest 1 View  Result Date: 01/27/2021 CLINICAL DATA:  Central line placement EXAM: CHEST  1 VIEW COMPARISON:  01/18/2021 FINDINGS: Interval placement of right IJ central venous catheter with tip over the SVC. No visible right pneumothorax. Cardiomegaly with vascular congestion. Probable small left effusion with left basilar airspace disease. IMPRESSION: 1. Right IJ central venous catheter tip over the SVC. No pneumothorax 2. Cardiomegaly with mild central congestion 3. Possible small left effusion with mild airspace disease at left base. Electronically Signed   By: Donavan Foil M.D.   On: 01/27/2021 22:00   CARDIAC CATHETERIZATION  Result Date: 01/27/2021 Conclusions: No angiographically significant coronary artery disease. Moderately elevated left heart and pulmonary artery pressures. Severely elevated right heart filling pressure. Severely reduced Fick cardiac output/index. Recommendations: Transfer to stepdown for initiation of milrinone infusion in the setting of low output heart failure. Decrease carvedilol and losartan with initiation of milrinone and borderline hypotension. Diurese as renal function tolerates following initiation of milrinone. Primary prevention of coronary artery disease. Nelva Bush, MD [facility] [address]  Result Date: 01/29/2021 CLINICAL DATA:  Dyspnea EXAM: PORTABLE CHEST 1 VIEW COMPARISON:  01/27/2021 FINDINGS: Right jugular central venous catheter tip near the cavoatrial junction unchanged. No pneumothorax Bibasilar airspace disease left greater than right unchanged. No significant effusion. IMPRESSION: Central line in the cavoatrial junction Bibasilar airspace disease left greater than right unchanged. Electronically Signed   By: Franchot Gallo M.D.   On: 01/29/2021 14:54   [place] EKG SITE RITE  Result Date: 01/27/2021 If Site Rite image not attached, placement could not be confirmed due to current cardiac rhythm.     Delene Ruffini, [place]  [phone] 01/30/2021, 11:01 AM

## 2021-01-30 NOTE — Procedures (Signed)
Arterial Catheter Insertion Procedure Note  Anthony Weber  [id number]  1946-02-08  Date:01/30/21  Time:10:53 AM    Provider Performing: Julian Hy    Procedure: Insertion of Arterial Line [id number]) with US guidance ([id number])   Indication(s) Blood pressure monitoring and/or need for frequent ABGs  Consent Risks of the procedure as well as the alternatives and risks of each were explained to the patient and/or caregiver.  Consent for the procedure was obtained and is signed in the bedside chart  Anesthesia None   Time Out Verified patient identification, verified procedure, site/side was marked, verified correct patient position, special equipment/implants available, medications/allergies/relevant history reviewed, required imaging and test results available.   Sterile Technique Maximal sterile technique including full sterile barrier drape, hand hygiene, sterile gown, sterile gloves, mask, hair covering, sterile ultrasound probe cover (if used).   Procedure Description Area of catheter insertion was cleaned with chlorhexidine and draped in sterile fashion. With real-time ultrasound guidance an arterial catheter was placed into the left radial artery.  Appropriate arterial tracings confirmed on monitor.     Complications/Tolerance None; patient tolerated the procedure well.   EBL Minimal   Specimen(s) None  Julian Hy, DO 01/30/21 10:53 AM [facility]

## 2021-01-30 NOTE — Progress Notes (Signed)
 NAME:  Anthony Weber, MRN:  [id number], DOB:  11/05/1945, LOS: 1 ADMISSION DATE:  01/29/2021, CONSULTATION DATE:  01/29/21 REFERRING MD:  [place] -[facility] CCM, CHIEF COMPLAINT:  Cardiogenic shock   History of Present Illness:  75 yo M PMH morbid obesity, HTN, HLD, hypothyroidism, HFrEF who presented to [facility] ED 10/9 with CC BLE swelling. He reportedly gained 15lb over 1wk prior to presentation, had incr DOE. He was admitted to hospitalist service for acute heart failure exacerbation. Cardiology was consulted 10/10 and recommended diuresis, lisonopril and repeat ECHO which revealed LVEF 25-30% with indeterminate LV diastolic parameters and poorly visualized RV systolic function.  He received ongoing medication optimization and went for R/LHC 10/18 which showed no significant CAD, moderately elevated L heart and PA pressures, severely elevated R Heart pressures. He was transferred to the ICU 10/18 and started on a milrinone infusion for cardiogenic shock.   On 10/20 the pt was started on a lasix gtt for hypoxia in setting of cardiogenic shock and a plan was made to transfer the patient to [place] [facility] for further management and care.   Labs on admission to [facility]: BNP 951 hsTrop 20  Albumin 3.4 Protein 6.2   Labs on admission to [place]:  Na 124 K 3.7 Cl 81 CO2 33 Cr 1.89  CBC pretty normal except plt 104  PCT 0.74 VBG 7.47 / 52/49  Pertinent  Medical History  HTN HFrEF HLD Morbid obesity Hypothyroidism   Significant Hospital Events: Including procedures, antibiotic start and stop dates in addition to other pertinent events   10/9 admitted to [facility] with likely HF exacerbation. Diuresed  10/10 cards consulted for HF recs  10/18 R/LHC without significant CAD, but had incr L heart, PA, and R heart pressures. Transferred to ICU, started on milronone> developed hypotension and NSVT> milrinone reduced and started norepi and amiodarone.  10/20 Transferred to [facility] 10/21 MSSA in blood  cultures   Interim History / Subjective:  Coughing up green sputum. Has worse leg edema and confusion compared to 2 weeks ago.  Objective   Blood pressure (!) 102/59, pulse (!) 39, temperature (!) 96.7 F (35.9 C), resp. rate 17, weight (!) 147.8 kg, SpO2 96 %. CVP:  [15 mmHg-23 mmHg] 15 mmHg      Intake/Output Summary (Last 24 hours) at 01/30/2021 0909 Last data filed at 01/30/2021 0700 Gross per 24 hour  Intake 675.5 ml  Output 790 ml  Net -114.5 ml    Filed Weights   01/30/21 0500  Weight: (!) 147.8 kg   General: chronically ill appearing man sitting up in bed in NAD HEENT: [place]/AT, eyes anicteric Cardio: S1S2, frequent PVCs, irreg rhyhm Resp: tachypnea, no accessory muscle use, rhonchi improved with suctioning Abd:obese, soft, NT Extremities:++ LE edema, no cyanosis Derm: warm, dry, no rashes Neuro: confused but able to answer questions, moving all extremities, globally weak  CXR personally reviewed> retrocardiac structures silhouetted, cardiomegaly, possibly opacity  Blood cultures> 4/4 MSSA Sputum culture> pending Sodium 122 BUN 85 Creatinine 2.08 BNP 333 Procalcitonin 0.8 WBC 12.2 H/H 12.4/36.9 Platelets 106 Coox  80%  Resolved Hospital Problem list     Assessment & Plan:   Acute respiratory failure with hypoxia and hypercapnea due to pneumonia, heart failure likely also contributing -Continue inotrope and vasopressor support -Agree with trial BiPAP - Needs arterial line - Broad-spectrum antibiotics for pneumonia  Cardiogenic shock  Acute on chronic HFrEF  NSVT -Continue milrinone and norepinephrine -Continue amiodarone -Continue Lasix -Appreciate heart failure management -  amio gtt  Acute metabolic encephalopathy due to sepsis and hypercapnia - Antibiotics - Supportive care - Frequent reorientation  MSSA bacteremia - Appreciate ID input - We will need TEE eventually - Redraw blood cultures today - Continue antibiotics  AKI ;  creatinine 0.8 -Strict I's/O - Renally dose meds and avoid nephrotoxic meds - Continue to monitor  Hyponatremia, probably hypervolemic hyponatremia - Diuresis - Continue to monitor  Urinary Tract Infection -Continue cefepime  Wife and son updated at bedside, continue aggressive care  Best Practice (right click and "Reselect all SmartList Selections" daily)   Diet/type: NPO DVT prophylaxis: prophylactic heparin  GI prophylaxis: N/A Lines: Central line and yes and it is still needed Foley:  Yes, and it is still needed Code Status:  full code Last date of multidisciplinary goals of care discussion [10/20, met with palliative care]  Labs   CBC: Recent Labs  Lab 01/27/21 0447 01/29/21 0602 01/30/21 0526  WBC 6.9 10.0 12.2*  NEUTROABS  --  8.7*  --   HGB 12.8* 12.1* 12.4*  HCT 37.0* 35.1* 36.9*  MCV 87.7 86.0 87.0  PLT 92* 104* 106*     Basic Metabolic Panel: Recent Labs  Lab 01/27/21 0447 01/27/21 1205 01/27/21 2242 01/28/21 0416 01/28/21 1900 01/29/21 0029 01/29/21 0602 01/29/21 1124 01/30/21 0526  NA 126* 126* 127* 126* 124* 124* 124* 122* 122*  K 3.5 4.0 4.0 3.8  --   --  3.7  --  4.0  CL 84* 82* 81* 81*  --   --  81*  --  80*  CO2 32 32 35* 34*  --   --  33*  --  32  GLUCOSE 102* 104* 142* 172*  --   --  134*  --  149*  BUN 53* 59* 66* 69*  --   --  83*  --  85*  CREATININE 1.24 1.49* 1.38* 1.48*  --   --  1.89*  --  2.08*  CALCIUM 7.9* 8.0* 8.1* 8.1*  --   --  8.1*  --  8.0*  MG 2.2  --  2.5* 2.3  --   --  2.3  --  2.4  PHOS  --   --   --   --   --   --  5.0*  --  4.6    GFR: Estimated Creatinine Clearance: 44 mL/min (A) (by C-G formula based on SCr of 2.08 mg/dL (H)). Recent Labs  Lab 01/27/21 0447 01/28/21 1118 01/29/21 0602 01/29/21 1124 01/29/21 2020 01/30/21 0526  PROCALCITON  --   --   --  0.74 0.80  --   WBC 6.9  --  10.0  --   --  12.2*  LATICACIDVEN  --  1.3  --   --  1.2  --      Liver Function Tests: Recent Labs  Lab  01/30/21 0526  AST 30  ALT 31  ALKPHOS 66  BILITOT 1.1  PROT 5.8*  ALBUMIN 2.4*   No results for input(s): LIPASE, AMYLASE in the last 168 hours. No results for input(s): AMMONIA in the last 168 hours.  ABG    Component Value Date/Time   HCO3 37.8 (H) 01/28/2021 2220   O2SAT 79.5 01/30/2021 0526      This patient is critically ill with multiple organ system failure which requires frequent high complexity decision making, assessment, support, evaluation, and titration of therapies. This was completed through the application of advanced monitoring technologies and extensive interpretation of multiple   databases. During this encounter critical care time was devoted to patient care services described in this note for 40 minutes.  Laura P Clark, DO 01/30/21 10:07 AM [facility]   

## 2021-01-30 NOTE — Progress Notes (Signed)
PHARMACY - PHYSICIAN COMMUNICATION CRITICAL VALUE ALERT - BLOOD CULTURE IDENTIFICATION (BCID)  Anthony Weber is an 75 y.o. male transferred to [facility] on 01/29/2021 with AKI, respiratory failure and cardiogenic shock  Assessment:   2/2 blood cultures growing MSSA  Name of physician (or Provider) Contacted:  Dr. Lucile Shutters  Current antibiotics:  Vancomycin and Cefepime   Changes to prescribed antibiotics recommended:  Consider narrowing to Ancef 2 g IV q12h when appropriate  Results for orders placed or performed during the hospital encounter of 01/18/21  Blood Culture ID Panel (Reflexed) (Collected: 01/29/2021  2:00 PM)  Result Value Ref Range   Enterococcus faecalis NOT DETECTED NOT DETECTED   Enterococcus Faecium NOT DETECTED NOT DETECTED   Listeria monocytogenes NOT DETECTED NOT DETECTED   Staphylococcus species DETECTED (A) NOT DETECTED   Staphylococcus aureus (BCID) DETECTED (A) NOT DETECTED   Staphylococcus epidermidis NOT DETECTED NOT DETECTED   Staphylococcus lugdunensis NOT DETECTED NOT DETECTED   Streptococcus species NOT DETECTED NOT DETECTED   Streptococcus agalactiae NOT DETECTED NOT DETECTED   Streptococcus pneumoniae NOT DETECTED NOT DETECTED   Streptococcus pyogenes NOT DETECTED NOT DETECTED   A.calcoaceticus-baumannii NOT DETECTED NOT DETECTED   Bacteroides fragilis NOT DETECTED NOT DETECTED   Enterobacterales NOT DETECTED NOT DETECTED   Enterobacter cloacae complex NOT DETECTED NOT DETECTED   Escherichia coli NOT DETECTED NOT DETECTED   Klebsiella aerogenes NOT DETECTED NOT DETECTED   Klebsiella oxytoca NOT DETECTED NOT DETECTED   Klebsiella pneumoniae NOT DETECTED NOT DETECTED   Proteus species NOT DETECTED NOT DETECTED   Salmonella species NOT DETECTED NOT DETECTED   Serratia marcescens NOT DETECTED NOT DETECTED   Haemophilus influenzae NOT DETECTED NOT DETECTED   Neisseria meningitidis NOT DETECTED NOT DETECTED   Pseudomonas aeruginosa NOT  DETECTED NOT DETECTED   Stenotrophomonas maltophilia NOT DETECTED NOT DETECTED   Candida albicans NOT DETECTED NOT DETECTED   Candida auris NOT DETECTED NOT DETECTED   Candida glabrata NOT DETECTED NOT DETECTED   Candida krusei NOT DETECTED NOT DETECTED   Candida parapsilosis NOT DETECTED NOT DETECTED   Candida tropicalis NOT DETECTED NOT DETECTED   Cryptococcus neoformans/gattii NOT DETECTED NOT DETECTED   Meth resistant mecA/C and MREJ NOT DETECTED NOT DETECTED    [name] 01/30/2021  4:02 AM

## 2021-01-31 DIAGNOSIS — A419 Sepsis, unspecified organism: Secondary | ICD-10-CM | POA: Diagnosis not present

## 2021-01-31 DIAGNOSIS — J189 Pneumonia, unspecified organism: Secondary | ICD-10-CM

## 2021-01-31 DIAGNOSIS — R7881 Bacteremia: Secondary | ICD-10-CM | POA: Diagnosis not present

## 2021-01-31 DIAGNOSIS — I493 Ventricular premature depolarization: Secondary | ICD-10-CM

## 2021-01-31 DIAGNOSIS — J9601 Acute respiratory failure with hypoxia: Secondary | ICD-10-CM | POA: Diagnosis not present

## 2021-01-31 LAB — AMMONIA: Ammonia: 19 umol/L (ref 9–35)

## 2021-01-31 LAB — COMPREHENSIVE METABOLIC PANEL
ALT: 28 U/L (ref 0–44)
AST: 26 U/L (ref 15–41)
Albumin: 2.3 g/dL — ABNORMAL LOW (ref 3.5–5.0)
Alkaline Phosphatase: 73 U/L (ref 38–126)
Anion gap: 10 (ref 5–15)
BUN: 76 mg/dL — ABNORMAL HIGH (ref 8–23)
CO2: 33 mmol/L — ABNORMAL HIGH (ref 22–32)
Calcium: 7.9 mg/dL — ABNORMAL LOW (ref 8.9–10.3)
Chloride: 81 mmol/L — ABNORMAL LOW (ref 98–111)
Creatinine, Ser: 1.65 mg/dL — ABNORMAL HIGH (ref 0.61–1.24)
GFR, Estimated: 43 mL/min — ABNORMAL LOW (ref >60)
Glucose, Bld: 119 mg/dL — ABNORMAL HIGH (ref 70–99)
Potassium: 3.8 mmol/L (ref 3.5–5.1)
Sodium: 124 mmol/L — ABNORMAL LOW (ref 135–145)
Total Bilirubin: 1 mg/dL (ref 0.3–1.2)
Total Protein: 5.9 g/dL — ABNORMAL LOW (ref 6.5–8.1)

## 2021-01-31 LAB — GLUCOSE, CAPILLARY
Glucose-Capillary: 126 mg/dL — ABNORMAL HIGH (ref 70–99)
Glucose-Capillary: 137 mg/dL — ABNORMAL HIGH (ref 70–99)
Glucose-Capillary: 153 mg/dL — ABNORMAL HIGH (ref 70–99)
Glucose-Capillary: 136 mg/dL — ABNORMAL HIGH (ref 70–99)

## 2021-01-31 LAB — URINE CULTURE: Culture: NO GROWTH

## 2021-01-31 LAB — BASIC METABOLIC PANEL
Anion gap: 9 (ref 5–15)
BUN: 76 mg/dL — ABNORMAL HIGH (ref 8–23)
CO2: 36 mmol/L — ABNORMAL HIGH (ref 22–32)
Calcium: 8.2 mg/dL — ABNORMAL LOW (ref 8.9–10.3)
Chloride: 81 mmol/L — ABNORMAL LOW (ref 98–111)
Creatinine, Ser: 1.43 mg/dL — ABNORMAL HIGH (ref 0.61–1.24)
GFR, Estimated: 51 mL/min — ABNORMAL LOW (ref >60)
Glucose, Bld: 162 mg/dL — ABNORMAL HIGH (ref 70–99)
Potassium: 3.9 mmol/L (ref 3.5–5.1)
Sodium: 126 mmol/L — ABNORMAL LOW (ref 135–145)

## 2021-01-31 LAB — CBC
HCT: 38.6 % — ABNORMAL LOW (ref 39.0–52.0)
Hemoglobin: 13.1 g/dL (ref 13.0–17.0)
MCH: 28.7 pg (ref 26.0–34.0)
MCHC: 33.9 g/dL (ref 30.0–36.0)
MCV: 84.6 fL (ref 80.0–100.0)
Platelets: 132 10*3/uL — ABNORMAL LOW (ref 150–400)
RBC: 4.56 MIL/uL (ref 4.22–5.81)
RDW: 14.3 % (ref 11.5–15.5)
WBC: 13.7 10*3/uL — ABNORMAL HIGH (ref 4.0–10.5)
nRBC: 0 % (ref 0.0–0.2)

## 2021-01-31 LAB — EXPECTORATED SPUTUM ASSESSMENT W GRAM STAIN, RFLX TO RESP C

## 2021-01-31 LAB — MAGNESIUM: Magnesium: 2.2 mg/dL (ref 1.7–2.4)

## 2021-01-31 LAB — COOXEMETRY PANEL
Carboxyhemoglobin: 1.1 % (ref 0.5–1.5)
Methemoglobin: 0.8 % (ref 0.0–1.5)
O2 Saturation: 77.5 %
Total hemoglobin: 13.4 g/dL (ref 12.0–16.0)

## 2021-01-31 LAB — PHOSPHORUS: Phosphorus: 3.2 mg/dL (ref 2.5–4.6)

## 2021-01-31 MED ORDER — TOLVAPTAN 15 MG PO TABS
15.0000 mg | ORAL_TABLET | Freq: Once | ORAL | Status: AC
  Administered 2021-01-31: 15 mg via ORAL
  Filled 2021-01-31: qty 1

## 2021-01-31 MED ORDER — POTASSIUM CHLORIDE CRYS ER 20 MEQ PO TBCR
40.0000 meq | EXTENDED_RELEASE_TABLET | Freq: Once | ORAL | Status: AC
  Administered 2021-01-31: 40 meq via ORAL
  Filled 2021-01-31: qty 2

## 2021-01-31 MED ORDER — SENNOSIDES-DOCUSATE SODIUM 8.6-50 MG PO TABS
1.0000 | ORAL_TABLET | Freq: Two times a day (BID) | ORAL | Status: DC
  Administered 2021-01-31 – 2021-02-10 (×13): 1 via ORAL
  Filled 2021-01-31 (×17): qty 1

## 2021-01-31 MED ORDER — POLYETHYLENE GLYCOL 3350 17 G PO PACK
17.0000 g | PACK | Freq: Two times a day (BID) | ORAL | Status: DC
  Administered 2021-01-31 – 2021-02-10 (×11): 17 g via ORAL
  Filled 2021-01-31 (×16): qty 1

## 2021-01-31 NOTE — Progress Notes (Addendum)
Advanced Heart Failure Rounding Note   Subjective:    More alert today. Denies SOB. Diuresing well. Co-ox 78% Scr 1.8 -> 1.6 CVP 12  Remains on NE 13 on milrinone 0.25. Lasix 15  Na 124   Objective:   Weight Range:  Vital Signs:   Temp:  [97.6 F (36.4 C)-98.2 F (36.8 C)] 98.2 F (36.8 C) (10/22 0400) Pulse Rate:  [39-99] 64 (10/22 0600) Resp:  [15-26] 15 (10/22 0600) BP: (95-138)/(49-65) 128/60 (10/22 0600) SpO2:  [92 %-100 %] 98 % (10/22 0600) Arterial Line BP: (83-156)/(43-79) 83/79 (10/21 2000) FiO2 (%):  [40 %] 40 % (10/22 0600) Weight:  [142.9 kg] 142.9 kg (10/22 0500) Last BM Date: 01/25/21  Weight change: Filed Weights   01/30/21 0500 01/31/21 0500  Weight: (!) 147.8 kg (!) 142.9 kg    Intake/Output:   Intake/Output Summary (Last 24 hours) at 01/31/2021 0714 Last data filed at 01/31/2021 0700 Gross per 24 hour  Intake 2696.72 ml  Output 4635 ml  Net -1938.28 ml     Physical Exam: General:  Obese male lying in bed No resp difficulty HEENT: normal Neck: supple. JVP  to jaw. Carotids 2+ bilat; no bruits. No lymphadenopathy or thryomegaly appreciated. Cor: PMI nondisplaced. Irregular rate & rhythm. No rubs, gallops or murmurs. Lungs: coarse Abdomen: obese soft, nontender, + distended. No hepatosplenomegaly. No bruits or masses. Good bowel sounds. Extremities: no cyanosis, clubbing, rash, 2-3+ edema Neuro: alert & orientedx3, cranial nerves grossly intact. moves all 4 extremities w/o difficulty. Affect pleasant  Telemetry: Sinus with frequent PVCs Personally reviewed   Labs: Basic Metabolic Panel: Recent Labs  Lab 01/28/21 0416 01/28/21 1900 01/29/21 0602 01/29/21 1124 01/30/21 0526 01/30/21 1051 01/30/21 1715 01/31/21 0500  NA 126*   < > 124* 122* 122* 121* 122* 124*  K 3.8  --  3.7  --  4.0 3.9 4.2 3.8  CL 81*  --  81*  --  80*  --  80* 81*  CO2 34*  --  33*  --  32  --  29 33*  GLUCOSE 172*  --  134*  --  149*  --  139* 119*  BUN  69*  --  83*  --  85*  --  82* 76*  CREATININE 1.48*  --  1.89*  --  2.08*  --  1.79* 1.65*  CALCIUM 8.1*  --  8.1*  --  8.0*  --  8.1* 7.9*  MG 2.3  --  2.3  --  2.4  --  2.4 2.2  PHOS  --   --  5.0*  --  4.6  --   --  3.2   < > = values in this interval not displayed.    Liver Function Tests: Recent Labs  Lab 01/30/21 0526 01/31/21 0500  AST 30 26  ALT 31 28  ALKPHOS 66 73  BILITOT 1.1 1.0  PROT 5.8* 5.9*  ALBUMIN 2.4* 2.3*   No results for input(s): LIPASE, AMYLASE in the last 168 hours. No results for input(s): AMMONIA in the last 168 hours.  CBC: Recent Labs  Lab 01/27/21 0447 01/29/21 0602 01/30/21 0526 01/30/21 1051 01/31/21 0500  WBC 6.9 10.0 12.2*  --  13.7*  NEUTROABS  --  8.7*  --   --   --   HGB 12.8* 12.1* 12.4* 13.3 13.1  HCT 37.0* 35.1* 36.9* 39.0 38.6*  MCV 87.7 86.0 87.0  --  84.6  PLT 92* 104* 106*  --  132*    Cardiac Enzymes: No results for input(s): CKTOTAL, CKMB, CKMBINDEX, TROPONINI in the last 168 hours.  BNP: BNP (last 3 results) Recent Labs    01/19/21 1237 01/27/21 0447 01/29/21 2020  BNP 942.4* 681.0* 333.2*    ProBNP (last 3 results) No results for input(s): PROBNP in the last 8760 hours.    Other results:  Imaging: DG Chest Port 1 View  Result Date: 01/29/2021 CLINICAL DATA:  Dyspnea EXAM: PORTABLE CHEST 1 VIEW COMPARISON:  01/27/2021 FINDINGS: Right jugular central venous catheter tip near the cavoatrial junction unchanged. No pneumothorax Bibasilar airspace disease left greater than right unchanged. No significant effusion. IMPRESSION: Central line in the cavoatrial junction Bibasilar airspace disease left greater than right unchanged. Electronically Signed   By: Franchot Gallo M.D.   On: 01/29/2021 14:54     Medications:     Scheduled Medications:  Chlorhexidine Gluconate Cloth  6 each Topical Daily   heparin  5,000 Units Subcutaneous Q8H   insulin aspart  0-15 Units Subcutaneous TID WC    Infusions:   amiodarone 30 mg/hr (01/31/21 0700)   ceFEPime (MAXIPIME) IV Stopped (01/30/21 2204)   furosemide (LASIX) 200 mg in dextrose 5% 100 mL (2mg /mL) infusion 15 mg/hr (01/31/21 0700)   milrinone 0.25 mcg/kg/min (01/31/21 0700)   norepinephrine (LEVOPHED) Adult infusion 13 mcg/min (01/31/21 0700)   vancomycin Stopped (01/30/21 2341)    PRN Medications: acetaminophen, docusate sodium, ondansetron (ZOFRAN) IV, polyethylene glycol   Assessment/Plan:   1. Shock - Cardiogenic/Septic  - Initial presentation consistent with cardiogenic shock but now with septic overlay with PNA and MSSA bacteremia - Procalcitonin 0.8  Lactic Acid 1.2 . RHC cardiac index 1.5  - BCx 2/2 MSSA - Urine Culture- pending  - Sputum pending  - Co-ox and BP improved on NE and milrinone    2. Acute Systolic Heart Failure  - Echo EF now down from 55-->20-25%. Cath with no CAD, elevated filling pressures and low output heart failure.  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  - High PVC burden. Susepct PVC induced cardiomyopathy. On amio to suppress burden  - Remains markedly volume overload. Continue lasix drip 15 mg per hour.  - No room for GDMT with hypotension.    3. Acute Hypoxic/Hypercarbia Respiratory Failure  - Improved with volume removal   4. NSVT, PVC  -High PVC burden On amio drip. Still with frequent PVCs. Continue amio   5. MSSA bacteremia - likely respiratory source - abx per CCM - will need TEE prior to d/c   6. AKI  -Admit creatinine 1.4>1.5>1.9>2.1 > 1.65 - Check BMET daily    7. Hyponatremia -Sodium 122-> 124 - give tolvaptan  8. Morbid Obesity    9.  AMS - ABC with CO2 retention   10. Suspected Sleep Apnea -will need outpatient sleep study    CRITICAL CARE Performed by: Glori Bickers  Total critical care time: 45 minutes  Critical care time was exclusive of separately billable procedures and  treating other patients.  Critical care was necessary to treat or prevent imminent or life-threatening deterioration.  Critical care was time spent personally by me (independent of midlevel providers or residents) on the following activities: development of treatment plan with patient and/or surrogate as well as nursing, discussions with consultants, evaluation of patient's response to treatment, examination of patient, obtaining history from patient or surrogate, ordering and performing treatments and interventions, ordering and review  of laboratory studies, ordering and review of radiographic studies, pulse oximetry and re-evaluation of patient's condition.   Length of Stay: 2   Glori Bickers MD 01/31/2021, 7:14 AM  Advanced Heart Failure Team Pager [phone] (M-F; [place])  Please contact [facility] for night-coverage after hours (4p -7a ) and weekends on [email or web address]

## 2021-01-31 NOTE — Progress Notes (Signed)
NAME:  Anthony Weber, MRN:  [id number], DOB:  Sep 08, 1945, LOS: 2 ADMISSION DATE:  01/29/2021, CONSULTATION DATE:  01/29/21 REFERRING MD:  Mortimer Fries -[facility] CCM, CHIEF COMPLAINT:  Cardiogenic shock   History of Present Illness:  75 yo M PMH morbid obesity, HTN, HLD, hypothyroidism, HFrEF who presented to [facility] ED 10/9 with CC BLE swelling. He reportedly gained 15lb over 1wk prior to presentation, had incr DOE. He was admitted to hospitalist service for acute heart failure exacerbation. Cardiology was consulted 10/10 and recommended diuresis, lisonopril and repeat ECHO which revealed LVEF 25-30% with indeterminate LV diastolic parameters and poorly visualized RV systolic function.  He received ongoing medication optimization and went for [facility] 10/18 which showed no significant CAD, moderately elevated L heart and PA pressures, severely elevated R Heart pressures. He was transferred to the ICU 10/18 and started on a milrinone infusion for cardiogenic shock.   On 10/20 the pt was started on a lasix gtt for hypoxia in setting of cardiogenic shock and a plan was made to transfer the patient to [place] for further management and care.   Labs on admission to [facility]: BNP 951 hsTrop 20  Albumin 3.4 Protein 6.2   Labs on admission to [facility]:  Na 124 K 3.7 Cl 81 CO2 33 Cr 1.89  CBC pretty normal except plt 104  PCT 0.74 VBG 7.47 / 52/49  Pertinent  Medical History  HTN HFrEF HLD Morbid obesity Hypothyroidism   Significant Hospital Events: Including procedures, antibiotic start and stop dates in addition to other pertinent events   10/9 admitted to [facility] with likely HF exacerbation. Diuresed  10/10 cards consulted for HF recs  10/18 R/LHC without significant CAD, but had incr L heart, PA, and R heart pressures. Transferred to ICU, started on milronone> developed hypotension and NSVT> milrinone reduced and started norepi and amiodarone.  10/20 Transferred to [facility] 10/21 MSSA in blood  cultures, aline placed and removed overnight   Interim History / Subjective:  Aline stopped working last night and was removed. No new issues. He denies complaints.  Objective   Blood pressure 134/61, pulse (!) 54, temperature 97.8 F (36.6 C), temperature source Oral, resp. rate 18, weight (!) 142.9 kg, SpO2 99 %. CVP:  [9 mmHg-22 mmHg] 14 mmHg  Vent Mode: BIPAP;PSV FiO2 (%):  [40 %] 40 % PEEP:  [5 cmH20] 5 cmH20 Pressure Support:  [8 cmH20] 8 cmH20   Intake/Output Summary (Last 24 hours) at 01/31/2021 1138 Last data filed at 01/31/2021 0954 Gross per 24 hour  Intake 2395.26 ml  Output 5735 ml  Net -3339.74 ml    Filed Weights   01/30/21 0500 01/31/21 0500  Weight: (!) 147.8 kg (!) 142.9 kg   General: chronically ill appearing man lying in bed in NAD HEENT: [place]/AT, eyes anicteric Cardio: S1S2, irreg rhythm, frequ PVCs Resp: breathing comfortably on [place], rhales, decreased basilar breath sounds Abd: obese, soft, NT Extremities: LE edema, no cyanosis. Good left hand perfusion distal to previous A-line. Derm: warm, dry, no rashes Neuro:  confused but redirectable, sleepy but arousable, moving all extremities   Blood cultures 10/20> 4/4 MSSA Blood cultures 10/21> NG x 1 day Blood cultures 10/22> 1 site drawn so far Sputum culture> few GPCs Sodium 124 BUN 76 Creatinine 1.65 WBC 13.7 H/H 13.1/38.6 Platelets 132 Coox  77.5%  Resolved Hospital Problem list     Assessment & Plan:   Acute respiratory failure with hypoxia and hypercapnea due to pneumonia, heart failure likely  also contributing -Con't vasopressors- requirements coming down. Aline correlated initially with BP cuff before losing waveform. Ok to titrate off cuff today as pressors are coming off. Trying to work towards a line holiday for bacteremia. -BiPAP PRN - Broad-spectrum antibiotics for pneumonia; with negative MRSA nares ok to drop vanc.  Cardiogenic shock; coox stable Acute on chronic HFrEF  NSVT,  frequent PVCs -Continue milrinone -titrate down NE to maintain MAP >65 as long as coox remains preserved -Continue amiodarone gtt to control ventricular ectopy. -Continue Lasix gtt -Appreciate heart failure's management -needs CVC for pressors and hemodynamic monitoring, but eventually needs a line holiday  Acute metabolic encephalopathy due to sepsis and hypercapnia - con't broad antibiotics for pneumonia; also covers MSSA - Supportive care - Frequent reorientation, day night orientation, family visitation  MSSA bacteremia; ID concerned for CLABSI - Appreciate ID input -repeat cultures pending - We will need TEE when more stable -needs line holiday when more stable - Redraw blood cultures today - Continue antibiotics  AKI ; baseline creatinine 0.8- likely due to sepsis and heart failure -strict I/O -renally dose meds, avoid nephrotoxic meds -diuresis -con't monitoring  Hyponatremia, probably hypervolemic hyponatremia - diuresis - daily monitoring  Concern for Urinary Tract Infection> may have been pyuria from bacteremia? -Continue cefepime -follow culture until finalized  Acute thrombocytopenia due to sepsis, possibly antibiotics -monitor  No family at bedside currently. Will update later.  Best Practice (right click and "Reselect all SmartList Selections" daily)   Diet/type: NPO DVT prophylaxis: prophylactic heparin  GI prophylaxis: N/A Lines: Central line and yes and it is still needed Foley:  Yes, and it is still needed Code Status:  full code Last date of multidisciplinary goals of care discussion [10/20, met with palliative care]  Labs   CBC: Recent Labs  Lab 01/27/21 0447 01/29/21 0602 01/30/21 0526 01/30/21 1051 01/31/21 0500  WBC 6.9 10.0 12.2*  --  13.7*  NEUTROABS  --  8.7*  --   --   --   HGB 12.8* 12.1* 12.4* 13.3 13.1  HCT 37.0* 35.1* 36.9* 39.0 38.6*  MCV 87.7 86.0 87.0  --  84.6  PLT 92* 104* 106*  --  132*     Basic Metabolic  Panel: Recent Labs  Lab 01/28/21 0416 01/28/21 1900 01/29/21 0602 01/29/21 1124 01/30/21 0526 01/30/21 1051 01/30/21 1715 01/31/21 0500  NA 126*   < > 124* 122* 122* 121* 122* 124*  K 3.8  --  3.7  --  4.0 3.9 4.2 3.8  CL 81*  --  81*  --  80*  --  80* 81*  CO2 34*  --  33*  --  32  --  29 33*  GLUCOSE 172*  --  134*  --  149*  --  139* 119*  BUN 69*  --  83*  --  85*  --  82* 76*  CREATININE 1.48*  --  1.89*  --  2.08*  --  1.79* 1.65*  CALCIUM 8.1*  --  8.1*  --  8.0*  --  8.1* 7.9*  MG 2.3  --  2.3  --  2.4  --  2.4 2.2  PHOS  --   --  5.0*  --  4.6  --   --  3.2   < > = values in this interval not displayed.    GFR: Estimated Creatinine Clearance: 54.4 mL/min (A) (by C-G formula based on SCr of 1.65 mg/dL (H)). Recent Labs  Lab 01/27/21 [phone]  01/28/21 1118 01/29/21 0602 01/29/21 1124 01/29/21 2020 01/30/21 0526 01/31/21 0500  PROCALCITON  --   --   --  0.74 0.80  --   --   WBC 6.9  --  10.0  --   --  12.2* 13.7*  LATICACIDVEN  --  1.3  --   --  1.2  --   --      Liver Function Tests: Recent Labs  Lab 01/30/21 0526 01/31/21 0500  AST 30 26  ALT 31 28  ALKPHOS 66 73  BILITOT 1.1 1.0  PROT 5.8* 5.9*  ALBUMIN 2.4* 2.3*    No results for input(s): LIPASE, AMYLASE in the last 168 hours. No results for input(s): AMMONIA in the last 168 hours.  ABG    Component Value Date/Time   PHART 7.312 (L) 01/30/2021 1051   PCO2ART 67.6 (HH) 01/30/2021 1051   PO2ART 99 01/30/2021 1051   HCO3 34.4 (H) 01/30/2021 1051   TCO2 36 (H) 01/30/2021 1051   O2SAT 77.5 01/31/2021 0529      This patient is critically ill with multiple organ system failure which requires frequent high complexity decision making, assessment, support, evaluation, and titration of therapies. This was completed through the application of advanced monitoring technologies and extensive interpretation of multiple databases. During this encounter critical care time was devoted to patient care services  described in this note for 36 minutes.  Julian Hy, DO 01/31/21 11:57 AM [facility]

## 2021-02-01 DIAGNOSIS — J189 Pneumonia, unspecified organism: Secondary | ICD-10-CM | POA: Diagnosis not present

## 2021-02-01 DIAGNOSIS — G9340 Encephalopathy, unspecified: Secondary | ICD-10-CM | POA: Diagnosis not present

## 2021-02-01 DIAGNOSIS — N179 Acute kidney failure, unspecified: Secondary | ICD-10-CM | POA: Diagnosis not present

## 2021-02-01 DIAGNOSIS — A419 Sepsis, unspecified organism: Secondary | ICD-10-CM | POA: Diagnosis not present

## 2021-02-01 LAB — COMPREHENSIVE METABOLIC PANEL
ALT: 27 U/L (ref 0–44)
AST: 24 U/L (ref 15–41)
Albumin: 2.3 g/dL — ABNORMAL LOW (ref 3.5–5.0)
Alkaline Phosphatase: 75 U/L (ref 38–126)
Anion gap: 10 (ref 5–15)
BUN: 71 mg/dL — ABNORMAL HIGH (ref 8–23)
CO2: 37 mmol/L — ABNORMAL HIGH (ref 22–32)
Calcium: 8.5 mg/dL — ABNORMAL LOW (ref 8.9–10.3)
Chloride: 82 mmol/L — ABNORMAL LOW (ref 98–111)
Creatinine, Ser: 1.19 mg/dL (ref 0.61–1.24)
GFR, Estimated: >60 mL/min (ref >60)
Glucose, Bld: 123 mg/dL — ABNORMAL HIGH (ref 70–99)
Potassium: 4.1 mmol/L (ref 3.5–5.1)
Sodium: 129 mmol/L — ABNORMAL LOW (ref 135–145)
Total Bilirubin: 1.4 mg/dL — ABNORMAL HIGH (ref 0.3–1.2)
Total Protein: 5.8 g/dL — ABNORMAL LOW (ref 6.5–8.1)

## 2021-02-01 LAB — POCT I-STAT EG7
Acid-Base Excess: 11 mmol/L — ABNORMAL HIGH (ref 0.0–2.0)
Bicarbonate: 40.2 mmol/L — ABNORMAL HIGH (ref 20.0–28.0)
Calcium, Ion: 1.12 mmol/L — ABNORMAL LOW (ref 1.15–1.40)
HCT: 42 % (ref 39.0–52.0)
Hemoglobin: 14.3 g/dL (ref 13.0–17.0)
O2 Saturation: 65 %
Patient temperature: 97.7
Potassium: 3.8 mmol/L (ref 3.5–5.1)
Sodium: 126 mmol/L — ABNORMAL LOW (ref 135–145)
TCO2: 42 mmol/L — ABNORMAL HIGH (ref 22–32)
pCO2, Ven: 69 mmHg — ABNORMAL HIGH (ref 44.0–60.0)
pH, Ven: 7.371 (ref 7.250–7.430)
pO2, Ven: 35 mmHg (ref 32.0–45.0)

## 2021-02-01 LAB — CBC
HCT: 39.3 % (ref 39.0–52.0)
Hemoglobin: 13.1 g/dL (ref 13.0–17.0)
MCH: 28.4 pg (ref 26.0–34.0)
MCHC: 33.3 g/dL (ref 30.0–36.0)
MCV: 85.1 fL (ref 80.0–100.0)
Platelets: 153 10*3/uL (ref 150–400)
RBC: 4.62 MIL/uL (ref 4.22–5.81)
RDW: 14.4 % (ref 11.5–15.5)
WBC: 12.5 10*3/uL — ABNORMAL HIGH (ref 4.0–10.5)
nRBC: 0 % (ref 0.0–0.2)

## 2021-02-01 LAB — COOXEMETRY PANEL
Carboxyhemoglobin: 1.1 % (ref 0.5–1.5)
Carboxyhemoglobin: 1.3 % (ref 0.5–1.5)
Methemoglobin: 0.8 % (ref 0.0–1.5)
Methemoglobin: 0.7 % (ref 0.0–1.5)
O2 Saturation: 75.3 %
O2 Saturation: 74.6 %
Total hemoglobin: 12.9 g/dL (ref 12.0–16.0)
Total hemoglobin: 13.1 g/dL (ref 12.0–16.0)

## 2021-02-01 LAB — CULTURE, BLOOD (ROUTINE X 2)
Special Requests: ADEQUATE
Special Requests: ADEQUATE

## 2021-02-01 LAB — PHOSPHORUS: Phosphorus: 2.6 mg/dL (ref 2.5–4.6)

## 2021-02-01 LAB — GLUCOSE, CAPILLARY
Glucose-Capillary: 124 mg/dL — ABNORMAL HIGH (ref 70–99)
Glucose-Capillary: 190 mg/dL — ABNORMAL HIGH (ref 70–99)
Glucose-Capillary: 117 mg/dL — ABNORMAL HIGH (ref 70–99)
Glucose-Capillary: 99 mg/dL (ref 70–99)

## 2021-02-01 LAB — MAGNESIUM: Magnesium: 2.1 mg/dL (ref 1.7–2.4)

## 2021-02-01 MED ORDER — MIDODRINE HCL 5 MG PO TABS
5.0000 mg | ORAL_TABLET | Freq: Three times a day (TID) | ORAL | Status: DC
  Administered 2021-02-01 – 2021-02-02 (×3): 5 mg via ORAL
  Filled 2021-02-01 (×3): qty 1

## 2021-02-01 MED ORDER — POTASSIUM CHLORIDE CRYS ER 20 MEQ PO TBCR
30.0000 meq | EXTENDED_RELEASE_TABLET | Freq: Two times a day (BID) | ORAL | Status: AC
  Administered 2021-02-01 (×2): 30 meq via ORAL
  Filled 2021-02-01 (×2): qty 1

## 2021-02-01 MED ORDER — ORAL CARE MOUTH RINSE
15.0000 mL | Freq: Two times a day (BID) | OROMUCOSAL | Status: DC
  Administered 2021-02-01 – 2021-02-04 (×7): 15 mL via OROMUCOSAL

## 2021-02-01 MED ORDER — SORBITOL 70 % SOLN
30.0000 mL | Freq: Once | Status: AC
  Administered 2021-02-01: 30 mL via ORAL
  Filled 2021-02-01: qty 30

## 2021-02-01 MED ORDER — SODIUM CHLORIDE 0.9 % IV SOLN
2.0000 g | Freq: Three times a day (TID) | INTRAVENOUS | Status: DC
  Administered 2021-02-01 – 2021-02-02 (×4): 2 g via INTRAVENOUS
  Filled 2021-02-01 (×4): qty 2

## 2021-02-01 NOTE — Progress Notes (Addendum)
Advanced Heart Failure Rounding Note   Subjective:     On NE 13 -> 5. milrinone 0.25. Lasix 15/hr. Excellent diuresis. Almost 6L out. Weight down 9 pounds overnight. (19 pounds total) CVP 4-5  Na 124 -> 129   On cefepime for MSSA bacteremia.   Denies CP or SOB.   Still with frequent PVCs   Objective:   Weight Range:  Vital Signs:   Temp:  [97.7 F (36.5 C)-98.1 F (36.7 C)] 97.7 F (36.5 C) (10/23 0732) Pulse Rate:  [49-93] 74 (10/23 0930) Resp:  [13-24] 13 (10/23 0930) BP: (98-128)/(43-109) 112/63 (10/23 0930) SpO2:  [89 %-99 %] 93 % (10/23 0930) FiO2 (%):  [40 %] 40 % (10/23 0500) Weight:  [139.1 kg] 139.1 kg (10/23 0500) Last BM Date: 01/25/21  Weight change: Filed Weights   01/30/21 0500 01/31/21 0500 02/01/21 0500  Weight: (!) 147.8 kg (!) 142.9 kg (!) 139.1 kg    Intake/Output:   Intake/Output Summary (Last 24 hours) at 02/01/2021 1021 Last data filed at 02/01/2021 0930 Gross per 24 hour  Intake 1368.74 ml  Output 5015 ml  Net -3646.26 ml      Physical Exam: General:  Obese male lying in bed No resp difficulty HEENT: normal Neck: supple. No JVP   Carotids 2+ bilat; no bruits. No lymphadenopathy or thryomegaly appreciated. Cor: PMI nondisplaced. Irregular rate & rhythm. No rubs, gallops or murmurs. Lungs: clear Abdomen: obese soft, nontender, nondistended. No hepatosplenomegaly. No bruits or masses. Good bowel sounds. Extremities: no cyanosis, clubbing, rash, 1+ edema with UNNA Neuro: alert & orientedx3, cranial nerves grossly intact. moves all 4 extremities w/o difficulty. Affect pleasant   Telemetry: Sinus with very frequent PVCs Personally reviewed   Labs: Basic Metabolic Panel: Recent Labs  Lab 01/29/21 0602 01/29/21 1124 01/30/21 0526 01/30/21 1051 01/30/21 1715 01/31/21 0500 01/31/21 1745 02/01/21 0515  NA 124*   < > 122* 121* 122* 124* 126* 129*  K 3.7  --  4.0 3.9 4.2 3.8 3.9 4.1  CL 81*  --  80*  --  80* 81* 81* 82*   CO2 33*  --  32  --  29 33* 36* 37*  GLUCOSE 134*  --  149*  --  139* 119* 162* 123*  BUN 83*  --  85*  --  82* 76* 76* 71*  CREATININE 1.89*  --  2.08*  --  1.79* 1.65* 1.43* 1.19  CALCIUM 8.1*  --  8.0*  --  8.1* 7.9* 8.2* 8.5*  MG 2.3  --  2.4  --  2.4 2.2  --  2.1  PHOS 5.0*  --  4.6  --   --  3.2  --  2.6   < > = values in this interval not displayed.     Liver Function Tests: Recent Labs  Lab 01/30/21 0526 01/31/21 0500 02/01/21 0515  AST 30 26 24   ALT 31 28 27   ALKPHOS 66 73 75  BILITOT 1.1 1.0 1.4*  PROT 5.8* 5.9* 5.8*  ALBUMIN 2.4* 2.3* 2.3*    No results for input(s): LIPASE, AMYLASE in the last 168 hours. Recent Labs  Lab 01/31/21 1106  AMMONIA 19    CBC: Recent Labs  Lab 01/27/21 0447 01/29/21 0602 01/30/21 0526 01/30/21 1051 01/31/21 0500 02/01/21 0515  WBC 6.9 10.0 12.2*  --  13.7* 12.5*  NEUTROABS  --  8.7*  --   --   --   --   HGB 12.8* 12.1* 12.4* 13.3  13.1 13.1  HCT 37.0* 35.1* 36.9* 39.0 38.6* 39.3  MCV 87.7 86.0 87.0  --  84.6 85.1  PLT 92* 104* 106*  --  132* 153     Cardiac Enzymes: No results for input(s): CKTOTAL, CKMB, CKMBINDEX, TROPONINI in the last 168 hours.  BNP: BNP (last 3 results) Recent Labs    01/19/21 1237 01/27/21 0447 01/29/21 2020  BNP 942.4* 681.0* 333.2*     ProBNP (last 3 results) No results for input(s): PROBNP in the last 8760 hours.    Other results:  Imaging: No results found.   Medications:     Scheduled Medications:  Chlorhexidine Gluconate Cloth  6 each Topical Daily   heparin  5,000 Units Subcutaneous Q8H   insulin aspart  0-15 Units Subcutaneous TID WC   mouth rinse  15 mL Mouth Rinse BID   midodrine  5 mg Oral TID WC   polyethylene glycol  17 g Oral BID   potassium chloride  30 mEq Oral BID   senna-docusate  1 tablet Oral BID    Infusions:  amiodarone 30 mg/hr (02/01/21 0900)   ceFEPime (MAXIPIME) IV 2 g (02/01/21 0915)   furosemide (LASIX) 200 mg in dextrose 5% 100 mL  (2mg /mL) infusion 15 mg/hr (02/01/21 0900)   milrinone 0.25 mcg/kg/min (02/01/21 0900)   norepinephrine (LEVOPHED) Adult infusion 5 mcg/min (02/01/21 0900)    PRN Medications: acetaminophen, docusate sodium, ondansetron (ZOFRAN) IV, polyethylene glycol   Assessment/Plan:   1. Shock - Cardiogenic/Septic  - Initial presentation consistent with cardiogenic shock but now with septic overlay with PNA and MSSA bacteremia - Procalcitonin 0.8  Lactic Acid 1.2 . RHC cardiac index 1.5  - BCx 2/2 MSSA. Urine Cx negative - Co-ox and BP improved on NE and milrinone. Will add midodrine 5 tid to help wean NE. Wean milrinone to 0.125   2. Acute Systolic Heart Failure  - Echo EF now down from 55-->20-25%. Cath with no CAD, elevated filling pressures and low output heart failure.  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  - Continues with high PVC burden despite IV amio. Susepct PVC induced cardiomyopathy. Continue amio to suppress burden may need to add mexilitene  - Diuresing well. Weight down 19 pounds. Volume status improving. Decrease lasix to 5/hr - No room for GDMT with hypotension.    3. Acute Hypoxic/Hypercarbia Respiratory Failure  - Improved with volume removal. Continue to support   4. NSVT, PVC  -High PVC burden On amio drip. Still with frequent PVCs. Continue amio> may need to add mexilitene - Keep K> 4.0 Mg>2.0 - outpatient sleep study  5. MSSA bacteremia - likely respiratory source - abx per CCM - will need TEE prior to d/c   6. AKI  - Admit creatinine 1.4>1.5>1.9>2.1 > 1.65 >1.19 - Follow BMET daily    7. Hyponatremia - Sodium 122-> 124 -> 129 - Can re-dose tolvaptan  8. Morbid Obesity    9.  AMS - ABC with CO2 retention   10. Suspected Sleep Apnea -will need outpatient sleep study    CRITICAL CARE Performed by: Glori Bickers  Total critical care time: 35 minutes  Critical  care time was exclusive of separately billable procedures and treating other patients.  Critical care was necessary to treat or prevent imminent or life-threatening deterioration.  Critical care was time spent personally by me (independent of midlevel providers or residents) on the following activities: development of treatment plan  with patient and/or surrogate as well as nursing, discussions with consultants, evaluation of patient's response to treatment, examination of patient, obtaining history from patient or surrogate, ordering and performing treatments and interventions, ordering and review of laboratory studies, ordering and review of radiographic studies, pulse oximetry and re-evaluation of patient's condition.   Length of Stay: 3   Glori Bickers MD 02/01/2021, 10:21 AM  Advanced Heart Failure Team Pager [phone] (M-F; [place])  Please contact [facility] for night-coverage after hours (4p -7a ) and weekends on [email or web address]

## 2021-02-01 NOTE — Evaluation (Signed)
Physical Therapy Evaluation Patient Details Name: Anthony Weber MRN: [id number] DOB: 1945/08/28 Today's Date: 02/01/2021  History of Present Illness  Pt adm to [facility] 10/9 with acute heart failure exacerbation. Pt developed cardiogenic shock, acute respiratory failure and PNA. Transferred to [facility] on 10/20. Pt also with MSSA bacteremia and encephalopathy.  PMH - chf, HTN, morbid obesity  Clinical Impression  Pt presents to PT with decr mobility due to weakness, decr balance and poor activity tolerance after 2 week hospitalization. Pt with sedentary lifestyle prior to admission which will likely make progress slow. Currently recommend SNF. If pt can make steady progress and get to ambulatory level with min assist or less may be able to return home with family.      Recommendations for follow up therapy are one component of a multi-disciplinary discharge planning process, led by the attending physician.  Recommendations may be updated based on patient status, additional functional criteria and insurance authorization.  Follow Up Recommendations SNF    Equipment Recommendations  Rolling walker with 5" wheels    Recommendations for Other Services       Precautions / Restrictions Precautions Precautions: Fall Restrictions Weight Bearing Restrictions: No      Mobility  Bed Mobility Overal bed mobility: Needs Assistance Bed Mobility: Supine to Sit     Supine to sit: +2 for physical assistance;Max assist;HOB elevated     General bed mobility comments: Assist to bring legs off of bed, elevate trunk into sitting and bring hips to EOB.    Transfers Overall transfer level: Needs assistance Equipment used: Rolling walker (2 wheeled) Transfers: Sit to/from [facility] Sit to Stand: +2 physical assistance;Mod assist;From elevated surface Stand pivot transfers: +2 physical assistance;Min assist       General transfer comment: Assist to bring hips up. Verbal cues  for hand placement. Bed to chair with pivotal steps with walker  Ambulation/Gait             General Gait Details: Pivotal steps to chair.  Stairs            Wheelchair Mobility    Modified Rankin (Stroke Patients Only)       Balance Overall balance assessment: Needs assistance Sitting-balance support: No upper extremity supported;Feet supported Sitting balance-Leahy Scale: Fair     Standing balance support: Bilateral upper extremity supported;During functional activity Standing balance-Leahy Scale: Poor Standing balance comment: walker and +2 min assist for static standing                             Pertinent Vitals/Pain Pain Assessment: No/denies pain    Home Living Family/patient expects to be discharged to:: Private residence Living Arrangements: Spouse/significant other Available Help at Discharge: Family;Available 24 hours/day Type of Home: House Home Access: Stairs to enter Entrance Stairs-Rails: Left Entrance Stairs-Number of Steps: 2 Home Layout: One level Home Equipment: Cane - single point      Prior Function Level of Independence: Independent         Comments: Indep w/ short household distances w/ support from walls, furniture.     Hand Dominance   Dominant Hand: Right    Extremity/Trunk Assessment   Upper Extremity Assessment Upper Extremity Assessment: Defer to OT evaluation    Lower Extremity Assessment Lower Extremity Assessment: Generalized weakness       Communication   Communication: No difficulties  Cognition Arousal/Alertness: Awake/alert Behavior During Therapy: WFL for tasks assessed/performed Overall Cognitive Status: Within  Functional Limits for tasks assessed                                        General Comments General comments (skin integrity, edema, etc.): VSS on 4L    Exercises     Assessment/Plan    PT Assessment Patient needs continued PT services  PT Problem List  Decreased strength;Decreased balance;Decreased mobility;Decreased activity tolerance;Obesity       PT Treatment Interventions DME instruction;Functional mobility training;Balance training;Patient/family education;Gait training;Therapeutic exercise;Therapeutic activities;Stair training    PT Goals (Current goals can be found in the Care Plan section)  Acute Rehab PT Goals Patient Stated Goal: to return home PT Goal Formulation: With patient Time For Goal Achievement: 02/15/21 Potential to Achieve Goals: Fair    Frequency Min 3X/week   Barriers to discharge Inaccessible home environment stairs to enter    Co-evaluation               AM-PAC PT "6 Clicks" Mobility  Outcome Measure Help needed turning from your back to your side while in a flat bed without using bedrails?: Total Help needed moving from lying on your back to sitting on the side of a flat bed without using bedrails?: Total Help needed moving to and from a bed to a chair (including a wheelchair)?: Total Help needed standing up from a chair using your arms (e.g., wheelchair or bedside chair)?: Total Help needed to walk in hospital room?: Total Help needed climbing 3-5 steps with a railing? : Total 6 Click Score: 6    End of Session Equipment Utilized During Treatment: Gait belt;Oxygen Activity Tolerance: Patient limited by fatigue Patient left: in chair;with call bell/phone within reach;with chair alarm set;with family/visitor present Nurse Communication: Mobility status PT Visit Diagnosis: Other abnormalities of gait and mobility (R26.89);Muscle weakness (generalized) (M62.81)    Time: 0388-8280 PT Time Calculation (min) (ACUTE ONLY): 26 min   Charges:   PT Evaluation $PT Eval Moderate Complexity: 1 Mod PT Treatments $Gait Training: 8-22 mins        [place] Pager [phone] Office [place] 02/01/2021, 11:26 AM

## 2021-02-01 NOTE — Progress Notes (Signed)
NAME:  Anthony Weber, MRN:  [id number], DOB:  04/23/1945, LOS: 3 ADMISSION DATE:  01/29/2021, CONSULTATION DATE:  01/29/21 REFERRING MD:  Mortimer Fries -[facility] CCM, CHIEF COMPLAINT:  Cardiogenic shock   History of Present Illness:  75 yo M PMH morbid obesity, HTN, HLD, hypothyroidism, HFrEF who presented to [facility] ED 10/9 with CC BLE swelling. He reportedly gained 15lb over 1wk prior to presentation, had incr DOE. He was admitted to hospitalist service for acute heart failure exacerbation. Cardiology was consulted 10/10 and recommended diuresis, lisonopril and repeat ECHO which revealed LVEF 25-30% with indeterminate LV diastolic parameters and poorly visualized RV systolic function.  He received ongoing medication optimization and went for [facility] 10/18 which showed no significant CAD, moderately elevated L heart and PA pressure, severely elevated R Heart pressures. He was transferred to the ICU 10/18 and started on a milrinone infusion for cardiogenic shock.   On 10/20 the pt was started on a lasix gtt for hypoxia in setting of cardiogenic shock and a plan was made to transfer the patient to [employer] [facility] for further management and care.   Labs on admission to [facility]: BNP 951 hsTrop 20  Albumin 3.4 Protein 6.2   Labs on admission to [facility]:  Na 124 K 3.7 Cl 81 CO2 33 Cr 1.89  CBC pretty normal except plt 104  PCT 0.74 VBG 7.47 / 52/49  Pertinent  Medical History  HTN HFrEF HLD Morbid obesity Hypothyroidism   Significant Hospital Events: Including procedures, antibiotic start and stop dates in addition to other pertinent events   10/9 admitted to [facility] with likely HF exacerbation. Diuresed  10/10 cards consulted for HF recs  10/18 R/LHC without significant CAD, but had incr L heart, PA, and R heart pressures. Transferred to ICU, started on milronone> developed hypotension and NSVT> milrinone reduced and started norepi and amiodarone.  10/20 Transferred to [facility] 10/21 MSSA in blood  cultures, A-line placed and removed overnight 10/23 still on pressors  Interim History / Subjective:  Slept well with bipap, still tired today. Has not been OOB yet. Still on low dose norepinephrine.  Objective   Blood pressure (!) 112/55, pulse 71, temperature 98.1 F (36.7 C), temperature source Axillary, resp. rate (!) 24, weight (!) 139.1 kg, SpO2 91 %. CVP:  [2 mmHg-20 mmHg] 7 mmHg  Vent Mode: BIPAP;PCV FiO2 (%):  [40 %] 40 % Set Rate:  [18 bmp] 18 bmp PEEP:  [5 cmH20] 5 cmH20   Intake/Output Summary (Last 24 hours) at 02/01/2021 0710 Last data filed at 02/01/2021 0400 Gross per 24 hour  Intake 1453.33 ml  Output 5595 ml  Net -4141.67 ml    Filed Weights   01/30/21 0500 01/31/21 0500 02/01/21 0500  Weight: (!) 147.8 kg (!) 142.9 kg (!) 139.1 kg   General: chronically ill appearing man lying in bed in NAD HEENT: [place]/AT, eyes anicteric Cardio: S1S2, irreg rhythm, frequent PVCs Resp: breathing comfortably on [place], no accessory muscle use, no conversational dyspnea. Reduced basilar breath sounds, faint rhales. Abd: obese, soft, NT Extremities: legs wrapped, ongoing edema Derm: warm,  Neuro:  fatigued, arousable to stimulation but falls back asleep, moving all extremities, answers questions appropriately, but limited history   Blood cultures 10/20> 4/4 MSSA Blood cultures 10/21> NG x 2 days Blood cultures 10/22> NGTD Sputum culture> few GPCs Urine cx> NGTD Sodium 129 BUN 71 Creatinine 1.19 WBC 12.5 H/H 13.1/39.3 Platelets 153 Coox  75.3%  Resolved Hospital Problem list     Assessment &  Plan:   Acute respiratory failure with hypoxia and hypercapnea due to pneumonia, heart failure likely also contributing -Con't vasopressors- requirements coming down. Aline correlated initially with BP cuff before losing waveform. Ok to titrate off cuff today as pressors are coming off. Trying to work towards a line holiday for bacteremia. -nocturnal bipap and PRN with  sleeping -Cont' broad GNR coverage for pneumonia, will also cover MSSA  Cardiogenic shock; coox stable Acute on chronic HFrEF  NSVT, frequent PVCs -Stopping milrinone per cardiology. Repeat coox this afternoon. -titrate NE to maintain MAP >65; needs CVC given edema and concern for pressor extravasation  -Con't amiodarone -Continue Lasix gtt -appreciate AHF management -con't CVC until off pressors, but needs line holiday  Acute metabolic encephalopathy due to sepsis and hypercapnia - con't broad spectrum antibiotics; follow cultures - OOB mobility, day-night orientation, try to keep him awake during the day to allow sleep overnight  MSSA bacteremia; ID concerned for CLABSI - Appreciate ID team's management -needs TEE eventually -repeat cultures pending -needs line holiday -con't antibiotics  AKI ; baseline creatinine 0.8- likely due to sepsis and heart failure -strict I/Os -renally dose meds, avoid nephrotoxic meds -con't diuresis  Hyponatremia, probably hypervolemic hyponatremia - diuresis - daily monitoring  Concern for Urinary Tract Infection> may have been pyuria from bacteremia? -Continue cefepime -follow culture until finalized  Acute thrombocytopenia due to sepsis, possibly antibiotics -monitor  Hyperglycemia, prediabetes. Minimal insulin requirements. -SSI PRN -goal BG <180  Son updated at bedside.  Best Practice (right click and "Reselect all SmartList Selections" daily)   Diet/type: Regular consistency (see orders) DVT prophylaxis: prophylactic heparin  GI prophylaxis: N/A Lines: Central line and yes and it is still needed- can come out when off pressors Foley:  Yes, and it is still needed Code Status:  full code Last date of multidisciplinary goals of care discussion [10/20, met with palliative care]  Labs   CBC: Recent Labs  Lab 01/27/21 0447 01/29/21 0602 01/30/21 0526 01/30/21 1051 01/31/21 0500 02/01/21 0515  WBC 6.9 10.0 12.2*  --   13.7* 12.5*  NEUTROABS  --  8.7*  --   --   --   --   HGB 12.8* 12.1* 12.4* 13.3 13.1 13.1  HCT 37.0* 35.1* 36.9* 39.0 38.6* 39.3  MCV 87.7 86.0 87.0  --  84.6 85.1  PLT 92* 104* 106*  --  132* 153     Basic Metabolic Panel: Recent Labs  Lab 01/29/21 0602 01/29/21 1124 01/30/21 0526 01/30/21 1051 01/30/21 1715 01/31/21 0500 01/31/21 1745 02/01/21 0515  NA 124*   < > 122* 121* 122* 124* 126* 129*  K 3.7  --  4.0 3.9 4.2 3.8 3.9 4.1  CL 81*  --  80*  --  80* 81* 81* 82*  CO2 33*  --  32  --  29 33* 36* 37*  GLUCOSE 134*  --  149*  --  139* 119* 162* 123*  BUN 83*  --  85*  --  82* 76* 76* 71*  CREATININE 1.89*  --  2.08*  --  1.79* 1.65* 1.43* 1.19  CALCIUM 8.1*  --  8.0*  --  8.1* 7.9* 8.2* 8.5*  MG 2.3  --  2.4  --  2.4 2.2  --  2.1  PHOS 5.0*  --  4.6  --   --  3.2  --  2.6   < > = values in this interval not displayed.    GFR: Estimated Creatinine Clearance: 74.2 mL/min (by  C-G formula based on SCr of 1.19 mg/dL). Recent Labs  Lab 01/28/21 1118 01/29/21 0602 01/29/21 1124 01/29/21 2020 01/30/21 0526 01/31/21 0500 02/01/21 0515  PROCALCITON  --   --  0.74 0.80  --   --   --   WBC  --  10.0  --   --  12.2* 13.7* 12.5*  LATICACIDVEN 1.3  --   --  1.2  --   --   --      Liver Function Tests: Recent Labs  Lab 01/30/21 0526 01/31/21 0500 02/01/21 0515  AST _0 ALT _1 ALKPHOS 66 73 75  BILITOT 1.1 1.0 1.4*  PROT 5.8* 5.9* 5.8*  ALBUMIN 2.4* 2.3* 2.3*    No results for input(s): LIPASE, AMYLASE in the last 168 hours. Recent Labs  Lab 01/31/21 1106  AMMONIA 19    ABG    Component Value Date/Time   PHART 7.312 (L) 01/30/2021 1051   PCO2ART 67.6 (HH) 01/30/2021 1051   PO2ART 99 01/30/2021 1051   HCO3 34.4 (H) 01/30/2021 1051   TCO2 36 (H) 01/30/2021 1051   O2SAT 75.3 02/01/2021 0515      This patient is critically ill with multiple organ system failure which requires frequent high complexity decision making, assessment, support,  evaluation, and titration of therapies. This was completed through the application of advanced monitoring technologies and extensive interpretation of multiple databases. During this encounter critical care time was devoted to patient care services described in this note for 40 minutes.  Julian Hy, DO 02/01/21 10:34 AM [facility]

## 2021-02-02 ENCOUNTER — Ambulatory Visit: Payer: Medicare HMO | Admitting: Family

## 2021-02-02 DIAGNOSIS — I509 Heart failure, unspecified: Secondary | ICD-10-CM

## 2021-02-02 DIAGNOSIS — B9561 Methicillin susceptible Staphylococcus aureus infection as the cause of diseases classified elsewhere: Secondary | ICD-10-CM

## 2021-02-02 DIAGNOSIS — J15211 Pneumonia due to Methicillin susceptible Staphylococcus aureus: Secondary | ICD-10-CM

## 2021-02-02 DIAGNOSIS — T80219D Unspecified infection due to central venous catheter, subsequent encounter: Secondary | ICD-10-CM

## 2021-02-02 LAB — COMPREHENSIVE METABOLIC PANEL
ALT: 26 U/L (ref 0–44)
AST: 29 U/L (ref 15–41)
Albumin: 2.2 g/dL — ABNORMAL LOW (ref 3.5–5.0)
Alkaline Phosphatase: 71 U/L (ref 38–126)
Anion gap: 8 (ref 5–15)
BUN: 63 mg/dL — ABNORMAL HIGH (ref 8–23)
CO2: 39 mmol/L — ABNORMAL HIGH (ref 22–32)
Calcium: 8.4 mg/dL — ABNORMAL LOW (ref 8.9–10.3)
Chloride: 83 mmol/L — ABNORMAL LOW (ref 98–111)
Creatinine, Ser: 1.06 mg/dL (ref 0.61–1.24)
GFR, Estimated: >60 mL/min (ref >60)
Glucose, Bld: 114 mg/dL — ABNORMAL HIGH (ref 70–99)
Potassium: 4.5 mmol/L (ref 3.5–5.1)
Sodium: 130 mmol/L — ABNORMAL LOW (ref 135–145)
Total Bilirubin: 1 mg/dL (ref 0.3–1.2)
Total Protein: 5.8 g/dL — ABNORMAL LOW (ref 6.5–8.1)

## 2021-02-02 LAB — COOXEMETRY PANEL
Carboxyhemoglobin: 1.3 % (ref 0.5–1.5)
Methemoglobin: 0.7 % (ref 0.0–1.5)
O2 Saturation: 78.1 %
Total hemoglobin: 13.4 g/dL (ref 12.0–16.0)

## 2021-02-02 LAB — GLUCOSE, CAPILLARY
Glucose-Capillary: 112 mg/dL — ABNORMAL HIGH (ref 70–99)
Glucose-Capillary: 159 mg/dL — ABNORMAL HIGH (ref 70–99)
Glucose-Capillary: 141 mg/dL — ABNORMAL HIGH (ref 70–99)
Glucose-Capillary: 118 mg/dL — ABNORMAL HIGH (ref 70–99)

## 2021-02-02 LAB — CBC
HCT: 39.5 % (ref 39.0–52.0)
Hemoglobin: 13.4 g/dL (ref 13.0–17.0)
MCH: 29.1 pg (ref 26.0–34.0)
MCHC: 33.9 g/dL (ref 30.0–36.0)
MCV: 85.7 fL (ref 80.0–100.0)
Platelets: 177 10*3/uL (ref 150–400)
RBC: 4.61 MIL/uL (ref 4.22–5.81)
RDW: 14.8 % (ref 11.5–15.5)
WBC: 11.5 10*3/uL — ABNORMAL HIGH (ref 4.0–10.5)
nRBC: 0 % (ref 0.0–0.2)

## 2021-02-02 LAB — CULTURE, RESPIRATORY W GRAM STAIN: Culture: NORMAL

## 2021-02-02 LAB — PHOSPHORUS: Phosphorus: 2.8 mg/dL (ref 2.5–4.6)

## 2021-02-02 LAB — MAGNESIUM: Magnesium: 2.2 mg/dL (ref 1.7–2.4)

## 2021-02-02 MED ORDER — ACETAZOLAMIDE 250 MG PO TABS
250.0000 mg | ORAL_TABLET | Freq: Two times a day (BID) | ORAL | Status: DC
  Administered 2021-02-02 – 2021-02-03 (×4): 250 mg via ORAL
  Filled 2021-02-02 (×5): qty 1

## 2021-02-02 MED ORDER — SORBITOL 70 % SOLN
30.0000 mL | Freq: Once | Status: AC
  Administered 2021-02-02: 30 mL via ORAL
  Filled 2021-02-02: qty 30

## 2021-02-02 MED ORDER — MIDODRINE HCL 5 MG PO TABS
10.0000 mg | ORAL_TABLET | Freq: Three times a day (TID) | ORAL | Status: DC
  Administered 2021-02-02 – 2021-02-11 (×28): 10 mg via ORAL
  Filled 2021-02-02 (×28): qty 2

## 2021-02-02 MED ORDER — CEFAZOLIN SODIUM-DEXTROSE 2-4 GM/100ML-% IV SOLN
2.0000 g | Freq: Three times a day (TID) | INTRAVENOUS | Status: DC
  Administered 2021-02-02 – 2021-02-11 (×28): 2 g via INTRAVENOUS
  Filled 2021-02-02 (×28): qty 100

## 2021-02-02 MED ORDER — PRAVASTATIN SODIUM 10 MG PO TABS
10.0000 mg | ORAL_TABLET | Freq: Every day | ORAL | Status: DC
  Administered 2021-02-02 – 2021-02-10 (×9): 10 mg via ORAL
  Filled 2021-02-02 (×9): qty 1

## 2021-02-02 MED ORDER — LEVOTHYROXINE SODIUM 50 MCG PO TABS
50.0000 ug | ORAL_TABLET | Freq: Every day | ORAL | Status: DC
  Administered 2021-02-02 – 2021-02-11 (×10): 50 ug via ORAL
  Filled 2021-02-02 (×10): qty 1

## 2021-02-02 NOTE — Progress Notes (Addendum)
Advanced Heart Failure Rounding Note   Subjective:    Admit weight 325 pounds --> 305 pounds.   On NE 2. milrinone 0.125., lasix drip 5 mg per hour. CO-OX 78%   Na 124 -> 129 ->130   On cefepime for MSSA bacteremia.   Did not wear CPAP last night. Denies SOB. No BMs   Objective:   Weight Range:  Vital Signs:   Temp:  [97.6 F (36.4 C)-97.9 F (36.6 C)] 97.9 F (36.6 C) (10/24 0400) Pulse Rate:  [34-97] 70 (10/24 0500) Resp:  [12-39] 12 (10/24 0500) BP: (76-132)/(41-109) 94/61 (10/24 0500) SpO2:  [89 %-99 %] 94 % (10/24 0500) Weight:  [138.6 kg] 138.6 kg (10/24 0500) Last BM Date: 01/25/21  Weight change: Filed Weights   01/31/21 0500 02/01/21 0500 02/02/21 0500  Weight: (!) 142.9 kg (!) 139.1 kg (!) 138.6 kg    Intake/Output:   Intake/Output Summary (Last 24 hours) at 02/02/2021 0750 Last data filed at 02/02/2021 0500 Gross per 24 hour  Intake 952.75 ml  Output 3130 ml  Net -2177.25 ml    CVP 8 Physical Exam: General: In bed.  No resp difficulty HEENT: normal Neck: supple. JVP 7-8 . Carotids 2+ bilat; no bruits. No lymphadenopathy or thryomegaly appreciated. Cor: PMI nondisplaced. Regular rate & rhythm. No rubs, gallops or murmurs. Lungs: clear decreased in the bases.  Abdomen: obese, soft, nontender, nondistended. No hepatosplenomegaly. No bruits or masses. Good bowel sounds. Extremities: no cyanosis, clubbing, rash, R and LLE unna boots 1+ edema Neuro: alert & orientedx3, cranial nerves grossly intact. moves all 4 extremities w/o difficulty. Affect pleasant   Telemetry: SR with brief episode tachycardia.   Labs: Basic Metabolic Panel: Recent Labs  Lab 01/29/21 0602 01/29/21 1124 01/30/21 0526 01/30/21 1051 01/30/21 1715 01/31/21 0500 01/31/21 1745 02/01/21 0515 02/02/21 0510  NA 124*   < > 122*   < > 122* 124* 126*  126* 129* 130*  K 3.7  --  4.0   < > 4.2 3.8 3.8  3.9 4.1 4.5  CL 81*  --  80*  --  80* 81* 81* 82* 83*  CO2 33*  --   32  --  29 33* 36* 37* 39*  GLUCOSE 134*  --  149*  --  139* 119* 162* 123* 114*  BUN 83*  --  85*  --  82* 76* 76* 71* 63*  CREATININE 1.89*  --  2.08*  --  1.79* 1.65* 1.43* 1.19 1.06  CALCIUM 8.1*  --  8.0*  --  8.1* 7.9* 8.2* 8.5* 8.4*  MG 2.3  --  2.4  --  2.4 2.2  --  2.1 2.2  PHOS 5.0*  --  4.6  --   --  3.2  --  2.6 2.8   < > = values in this interval not displayed.    Liver Function Tests: Recent Labs  Lab 01/30/21 0526 01/31/21 0500 02/01/21 0515 02/02/21 0510  AST 30 26 24 29   ALT 31 28 27 26   ALKPHOS 66 73 75 71  BILITOT 1.1 1.0 1.4* 1.0  PROT 5.8* 5.9* 5.8* 5.8*  ALBUMIN 2.4* 2.3* 2.3* 2.2*   No results for input(s): LIPASE, AMYLASE in the last 168 hours. Recent Labs  Lab 01/31/21 1106  AMMONIA 19    CBC: Recent Labs  Lab 01/29/21 0602 01/30/21 0526 01/30/21 1051 01/31/21 0500 01/31/21 1745 02/01/21 0515 02/02/21 0510  WBC 10.0 12.2*  --  13.7*  --  12.5*  11.5*  NEUTROABS 8.7*  --   --   --   --   --   --   HGB 12.1* 12.4* 13.3 13.1 14.3 13.1 13.4  HCT 35.1* 36.9* 39.0 38.6* 42.0 39.3 39.5  MCV 86.0 87.0  --  84.6  --  85.1 85.7  PLT 104* 106*  --  132*  --  153 177    Cardiac Enzymes: No results for input(s): CKTOTAL, CKMB, CKMBINDEX, TROPONINI in the last 168 hours.  BNP: BNP (last 3 results) Recent Labs    01/19/21 1237 01/27/21 0447 01/29/21 2020  BNP 942.4* 681.0* 333.2*    ProBNP (last 3 results) No results for input(s): PROBNP in the last 8760 hours.    Other results:  Imaging: No results found.   Medications:     Scheduled Medications:  Chlorhexidine Gluconate Cloth  6 each Topical Daily   heparin  5,000 Units Subcutaneous Q8H   insulin aspart  0-15 Units Subcutaneous TID WC   mouth rinse  15 mL Mouth Rinse BID   midodrine  5 mg Oral TID WC   polyethylene glycol  17 g Oral BID   senna-docusate  1 tablet Oral BID    Infusions:  amiodarone 30 mg/hr (02/02/21 0500)   ceFEPime (MAXIPIME) IV Stopped (02/02/21  0134)   furosemide (LASIX) 200 mg in dextrose 5% 100 mL (2mg /mL) infusion 5 mg/hr (02/02/21 0500)   milrinone 0.125 mcg/kg/min (02/02/21 0500)   norepinephrine (LEVOPHED) Adult infusion 2 mcg/min (02/02/21 0500)    PRN Medications: acetaminophen, docusate sodium, ondansetron (ZOFRAN) IV, polyethylene glycol   Assessment/Plan:   1. Shock - Cardiogenic/Septic  - Initial presentation consistent with cardiogenic shock but now with septic overlay with PNA and MSSA bacteremia - Procalcitonin 0.8  Lactic Acid 1.2 . RHC cardiac index 1.5  - BCx 2/2 MSSA. Urine Cx negative -  Continue milrinone to 0.125. Increase midodrine 10 mg tid. Wean off Norepi.  - Set up TEE Wed?    2. Acute Systolic Heart Failure  - Echo EF now down from 55-->20-25%. Cath with no CAD, elevated filling pressures and low output heart failure.  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  - Continues with high PVC burden despite IV amio. Susepct PVC induced cardiomyopathy. Continue amio to suppress burden - Increase midodrine 10 mg tid. Wean norepi today.  - Diuresing well. Weight down 20 pounds.  - Continue lasix drip.  - Add diamox 250 mg twice a day.  - No room for GDMT with hypotension.    3. Acute Hypoxic/Hypercarbia Respiratory Failure  - Improved with volume removal. Continue to wean oxygen as needed.    4. NSVT, PVC  -High PVC burden On amio drip. PVCs burden reduced. Continue amio.   Keep K> 4.0 Mg>2.0 - outpatient sleep study  5. MSSA bacteremia - likely respiratory source - abx per CCM - will need TEE prior to d/c   6. AKI  - Creatinine peaked at 2.1 , now normalized.     7. Hyponatremia - Sodium 130   8. Morbid Obesity   Body mass index is 43.84 kg/m.  9.  AMS - ABC with CO2 retention   10. Suspected Sleep Apnea -will need outpatient sleep study  11. Hypothyroidism  Check TSH Start home dose of  levothyroixine.     PT recommending SNF.    Length of Stay: 4   [name]  02/02/2021, 7:50 AM  Advanced Heart Failure Team Pager [phone] (M-F; [place])  Please contact [facility] for night-coverage after hours (4p -7a ) and weekends on [email or web address]  Agree with above   Remains on NE and milrinone. Midodrine added to help wean NE. On Lasix gtt at 5. Continues to diurese. On amio gtt. Still with frequent PVCs but seem somewhat suppressed.   More alert. Very weak. Denies CP or SOB. Remains on cefepime.   General:  Obese male lying in bed  No resp difficulty HEENT: normal Neck: supple. JVP 8 Carotids 2+ bilat; no bruits. No lymphadenopathy or thryomegaly appreciated. Cor: PMI nondisplaced. Irregular rate & rhythm. No rubs, gallops or murmurs. Lungs: clear Abdomen: obese  soft, nontender, nondistended. No hepatosplenomegaly. No bruits or masses. Good bowel sounds. Extremities: no cyanosis, clubbing, rash, 1-2+ edema Neuro: alert & orientedx3, cranial nerves grossly intact. moves all 4 extremities w/o difficulty. Affect pleasant  He remains on NE/milrinone. Increase midodrine to facilitate wean. Continue IV diuresis. D/w ID agree with narrowing abx now that he is hemodynamically more stable - appreciate their input. Will plan TEE on Wednesday.   CRITICAL CARE Performed by: Glori Bickers  Total critical care time: 35 minutes  Critical care time was exclusive of separately billable procedures and treating other patients.  Critical care was necessary to treat or prevent imminent or life-threatening deterioration.  Critical care was time spent personally by me (independent of midlevel providers or residents) on the following activities: development of treatment plan with patient and/or surrogate as well as nursing, discussions with consultants, evaluation of patient's response to treatment, examination of patient, obtaining history from patient or surrogate, ordering and  performing treatments and interventions, ordering and review of laboratory studies, ordering and review of radiographic studies, pulse oximetry and re-evaluation of patient's condition.  Glori Bickers, MD  9:14 PM

## 2021-02-02 NOTE — Evaluation (Signed)
Occupational Therapy Evaluation Patient Details Name: Anthony Weber MRN: [id number] DOB: 21-Mar-1946 Today's Date: 02/02/2021   History of Present Illness Pt adm to [facility] 10/9 with acute heart failure exacerbation. Pt developed cardiogenic shock, acute respiratory failure and PNA. Transferred to [facility] on 10/20. Pt also with MSSA bacteremia and encephalopathy.  PMH - chf, HTN, morbid obesity   Clinical Impression   PTA, pt lives with spouse and reports Independence with ADLs and mobility without AD. Pt presents now with deficits in cardiopulmonary tolerance, standing balance, strength and edema. Pt able to progress bed mobility to Mod A x 1-2 and stand pivots with Min A x 2 though reliant on BUE support of RW. Pt overall Setup for UB ADLs and Mod A for LB ADLs at this time. Encouraged elevation of BUE and AROM exercises to decrease swelling. Pt's family present and pleased with progress today. Recommend SNF rehab based on current presentation. Will continue to follow acutely and update recs as appropriate.       Recommendations for follow up therapy are one component of a multi-disciplinary discharge planning process, led by the attending physician.  Recommendations may be updated based on patient status, additional functional criteria and insurance authorization.   Follow Up Recommendations  Skilled nursing-short term rehab (<3 hours/day)    Assistance Recommended at Discharge Intermittent Supervision/Assistance  Functional Status Assessment  Patient has had a recent decline in their functional status and demonstrates the ability to make significant improvements in function in a reasonable and predictable amount of time.  Equipment Recommendations  Other (comment) (bariatric RW if does not already have)    Recommendations for Other Services       Precautions / Restrictions Precautions Precautions: Fall Precaution Comments: monitor O2 Restrictions Weight Bearing Restrictions: No       Mobility Bed Mobility Overal bed mobility: Needs Assistance Bed Mobility: Supine to Sit     Supine to sit: Mod assist;+2 for safety/equipment;HOB elevated     General bed mobility comments: Mod A x 2, assist to bring LEs off of bed and rehab tech assisting with trunk. Pt likely able to complete with 1 person assist via strength noted with handheld assist to bring trunk forward    Transfers Overall transfer level: Needs assistance Equipment used: Rolling walker (2 wheels) Transfers: Sit to/from Stand;Stand Pivot Transfers Sit to Stand: Min assist;+2 physical assistance;+2 safety/equipment;From elevated surface Stand pivot transfers: Min assist;+2 physical assistance;+2 safety/equipment;From elevated surface         General transfer comment: Min A x 2 for standing and pivotal steps to recliner via RW (would benefit from [place] RW). Minor assist to power up, manuver RW and manage lines      Balance Overall balance assessment: Needs assistance Sitting-balance support: No upper extremity supported;Feet supported Sitting balance-Leahy Scale: Fair     Standing balance support: Bilateral upper extremity supported;During functional activity Standing balance-Leahy Scale: Poor Standing balance comment: reliant on UE support in standing                           ADL either performed or assessed with clinical judgement   ADL Overall ADL's : Needs assistance/impaired Eating/Feeding: Independent   Grooming: Set up;Sitting   Upper Body Bathing: Set up;Sitting;With adaptive equipment   Lower Body Bathing: Moderate assistance;Sit to/from stand   Upper Body Dressing : Set up;Sitting   Lower Body Dressing: Moderate assistance;Sit to/from stand   Toilet Transfer: Stand-pivot;Minimal assistance;+2 for physical assistance;+2  for safety/equipment;Rolling walker (2 wheels) Toilet Transfer Details (indicate cue type and reason): simulated to recliner Toileting- Clothing  Manipulation and Hygiene: Moderate assistance;Sit to/from stand         General ADL Comments: Pt limited by edema, weakness and balance deficits requiring increased assist for LB ADLs and mobility.     Vision Baseline Vision/History: 0 No visual deficits Ability to See in Adequate Light: 0 Adequate Patient Visual Report: No change from baseline Vision Assessment?: No apparent visual deficits     Perception     Praxis      Pertinent Vitals/Pain Pain Assessment: Faces Faces Pain Scale: Hurts a little bit Pain Location: generalized with movement Pain Descriptors / Indicators: Grimacing;Guarding Pain Intervention(s): Limited activity within patient's tolerance;Monitored during session     Hand Dominance Right   Extremity/Trunk Assessment Upper Extremity Assessment Upper Extremity Assessment: RUE deficits/detail;LUE deficits/detail RUE Deficits / Details: noted B UE swelling, L > R LUE Deficits / Details: noted B UE swelling, L > R   Lower Extremity Assessment Lower Extremity Assessment: Defer to PT evaluation   Cervical / Trunk Assessment Cervical / Trunk Assessment: Kyphotic   Communication Communication Communication: No difficulties   Cognition Arousal/Alertness: Awake/alert Behavior During Therapy: WFL for tasks assessed/performed Overall Cognitive Status: Within Functional Limits for tasks assessed                                       General Comments  SpO2 briefly 88% on 2 L O2 (unreliable pleth). BP soft but WFL. HR WFL. Son present and wife entering at end of session    Exercises     Shoulder Instructions      Home Living Family/patient expects to be discharged to:: Private residence Living Arrangements: Spouse/significant other Available Help at Discharge: Family;Available 24 hours/day Type of Home: House Home Access: Stairs to enter [employer] of Steps: 2 Entrance Stairs-Rails: Left Home Layout: One level      Bathroom Shower/Tub: Occupational psychologist: Standard     Home Equipment: Cane - single point;Shower Land (2 wheels)          Prior Functioning/Environment Prior Level of Function : Independent/Modified Independent             Mobility Comments: no use of AD for mobility typically ADLs Comments: Reports Independent for ADLs, stands for showers. reports his wife completes IADLs        OT Problem List: Decreased strength;Decreased activity tolerance;Impaired balance (sitting and/or standing);Decreased safety awareness;Decreased knowledge of use of DME or AE;Cardiopulmonary status limiting activity;Increased edema      OT Treatment/Interventions: Self-care/ADL training;Therapeutic exercise;Therapeutic activities;Energy conservation;Patient/family education;DME and/or AE instruction;Balance training;Manual therapy    OT Goals(Current goals can be found in the care plan section) Acute Rehab OT Goals Patient Stated Goal: go home soon OT Goal Formulation: With patient Time For Goal Achievement: 02/16/21 Potential to Achieve Goals: Good  OT Frequency: Min 2X/week   Barriers to D/C:            Co-evaluation              AM-PAC OT "6 Clicks" Daily Activity     Outcome Measure Help from another person eating meals?: None Help from another person taking care of personal grooming?: A Little Help from another person toileting, which includes using toliet, bedpan, or urinal?: A Lot Help from another person bathing (including  washing, rinsing, drying)?: A Lot Help from another person to put on and taking off regular upper body clothing?: A Little Help from another person to put on and taking off regular lower body clothing?: A Lot 6 Click Score: 16   End of Session Equipment Utilized During Treatment: Gait belt;Rolling walker (2 wheels);Oxygen Nurse Communication: Mobility status  Activity Tolerance: Patient tolerated treatment well Patient  left: in bed;with call bell/phone within reach;with chair alarm set;with family/visitor present (chair pad connected. nurse call cord not attached to alarm box on entry)  OT Visit Diagnosis: Unsteadiness on feet (R26.81);Muscle weakness (generalized) (M62.81);Other abnormalities of gait and mobility (R26.89)                Time: 1410-3013 OT Time Calculation (min): 28 min Charges:  OT General Charges $OT Visit: 1 Visit OT Evaluation $OT Eval Moderate Complexity: 1 Mod OT Treatments $Therapeutic Activity: 8-22 mins  Malachy Chamber, OTR/L Acute Rehab Services Office: [phone]   Layla Maw 02/02/2021, 1:14 PM

## 2021-02-02 NOTE — Progress Notes (Signed)
NAME:  Anthony Weber, MRN:  [id number], DOB:  11-30-1945, LOS: 4 ADMISSION DATE:  01/29/2021, CONSULTATION DATE:  01/29/21 REFERRING MD:  Mortimer Fries -[facility] CCM, CHIEF COMPLAINT:  Cardiogenic shock   History of Present Illness:  75 yo M PMH morbid obesity, HTN, HLD, hypothyroidism, HFrEF who presented to [facility] ED 10/9 with CC BLE swelling. He reportedly gained 15lb over 1wk prior to presentation, had incr DOE. He was admitted to hospitalist service for acute heart failure exacerbation. Cardiology was consulted 10/10 and recommended diuresis, lisonopril and repeat ECHO which revealed LVEF 25-30% with indeterminate LV diastolic parameters and poorly visualized RV systolic function.  He received ongoing medication optimization and went for [facility] 10/18 which showed no significant CAD, moderately elevated L heart and PA pressure, severely elevated R Heart pressures. He was transferred to the ICU 10/18 and started on a milrinone infusion for cardiogenic shock.   On 10/20 the pt was started on a lasix gtt for hypoxia in setting of cardiogenic shock and a plan was made to transfer the patient to [place] for further management and care.   Labs on admission to [facility]: BNP 951 hsTrop 20  Albumin 3.4 Protein 6.2   Labs on admission to [facility]:  Na 124 K 3.7 Cl 81 CO2 33 Cr 1.89  CBC pretty normal except plt 104  PCT 0.74 VBG 7.47 / 52/49  Pertinent  Medical History  HTN HFrEF HLD Morbid obesity Hypothyroidism   Significant Hospital Events: Including procedures, antibiotic start and stop dates in addition to other pertinent events   10/9 admitted to [facility] with likely HF exacerbation. Diuresed  10/10 cards consulted for HF recs  10/18 R/LHC without significant CAD, but had incr L heart, PA, and R heart pressures. Transferred to ICU, started on milronone> developed hypotension and NSVT> milrinone reduced and started norepi and amiodarone.  10/20 Transferred to [facility] 10/21 MSSA in blood  cultures, A-line placed and removed overnight 10/23 still on pressors  Interim History / Subjective:  Improving dyspnea.  No voiced complaints  Objective   Blood pressure (!) 113/50, pulse 79, temperature 97.6 F (36.4 C), temperature source Oral, resp. rate (!) 23, weight (!) 138.6 kg, SpO2 93 %. CVP:  [1 mmHg-27 mmHg] 2 mmHg      Intake/Output Summary (Last 24 hours) at 02/02/2021 0954 Last data filed at 02/02/2021 0500 Gross per 24 hour  Intake 871.6 ml  Output 2430 ml  Net -1558.4 ml    Filed Weights   01/31/21 0500 02/01/21 0500 02/02/21 0500  Weight: (!) 142.9 kg (!) 139.1 kg (!) 138.6 kg   General: chronically ill appearing man lying in bed in NAD HEENT: [place]/AT, eyes anicteric Cardio: S1S2, irreg rhythm, frequent PVCs Resp: breathing comfortably on [place], no accessory muscle use, no conversational dyspnea.  Dullness right base Abd: obese, soft, NT Extremities: legs wrapped, ongoing edema mainly in dependent area Derm: warm,  Neuro: Awake follows commands.  Resolved Hospital Problem list     Assessment & Plan:   Critically ill due to acute respiratory failure with hypoxia and hypercapnea due to pneumonia and decompensated biventricular failure requiring noninvasive ventilation Bacterial pneumonia suspect MSSA MSSA bacteremia due to above Critically ill due to cardiogenic shock requiring titration of norepinephrine and milrinone Acute on chronic HFrEF  NSVT, frequent PVCs requiring milrinone Acute metabolic encephalopathy due to sepsis and hypercapnia Hypervolemic hyponatremia  Plan:  -Continue nocturnal BiPAP and daytime BiPAP as needed -Wean FiO2 as tolerated.  Repeat chest x-ray. -  Continue antibiotics for MSSA bacteremia.  ID is following.  Patient still needs TEE -Continue milrinone at current dose.  Titrate norepinephrine to keep MAP greater than 65 -Continue furosemide infusion at 5 mg/h residual edema may take time to clear. -Continue amiodarone.  Best  Practice (right click and "Reselect all SmartList Selections" daily)   Diet/type: Regular consistency (see orders) DVT prophylaxis: prophylactic heparin  GI prophylaxis: N/A Lines: Central line and yes and it is still needed- can come out when off pressors Foley:  Yes, and it is still needed Code Status:  full code Last date of multidisciplinary goals of care discussion [10/20, met with palliative care]  Labs   CBC: Recent Labs  Lab 01/29/21 0602 01/30/21 0526 01/30/21 1051 01/31/21 0500 01/31/21 1745 02/01/21 0515 02/02/21 0510  WBC 10.0 12.2*  --  13.7*  --  12.5* 11.5*  NEUTROABS 8.7*  --   --   --   --   --   --   HGB 12.1* 12.4* 13.3 13.1 14.3 13.1 13.4  HCT 35.1* 36.9* 39.0 38.6* 42.0 39.3 39.5  MCV 86.0 87.0  --  84.6  --  85.1 85.7  PLT 104* 106*  --  132*  --  153 177     Basic Metabolic Panel: Recent Labs  Lab 01/29/21 0602 01/29/21 1124 01/30/21 0526 01/30/21 1051 01/30/21 1715 01/31/21 0500 01/31/21 1745 02/01/21 0515 02/02/21 0510  NA 124*   < > 122*   < > 122* 124* 126*  126* 129* 130*  K 3.7  --  4.0   < > 4.2 3.8 3.8  3.9 4.1 4.5  CL 81*  --  80*  --  80* 81* 81* 82* 83*  CO2 33*  --  32  --  29 33* 36* 37* 39*  GLUCOSE 134*  --  149*  --  139* 119* 162* 123* 114*  BUN 83*  --  85*  --  82* 76* 76* 71* 63*  CREATININE 1.89*  --  2.08*  --  1.79* 1.65* 1.43* 1.19 1.06  CALCIUM 8.1*  --  8.0*  --  8.1* 7.9* 8.2* 8.5* 8.4*  MG 2.3  --  2.4  --  2.4 2.2  --  2.1 2.2  PHOS 5.0*  --  4.6  --   --  3.2  --  2.6 2.8   < > = values in this interval not displayed.    GFR: Estimated Creatinine Clearance: 83.2 mL/min (by C-G formula based on SCr of 1.06 mg/dL). Recent Labs  Lab 01/28/21 1118 01/29/21 0602 01/29/21 1124 01/29/21 2020 01/30/21 0526 01/31/21 0500 02/01/21 0515 02/02/21 0510  PROCALCITON  --   --  0.74 0.80  --   --   --   --   WBC  --    < >  --   --  12.2* 13.7* 12.5* 11.5*  LATICACIDVEN 1.3  --   --  1.2  --   --   --   --     < > = values in this interval not displayed.     Liver Function Tests: Recent Labs  Lab 01/30/21 0526 01/31/21 0500 02/01/21 0515 02/02/21 0510  AST 30 26 24 29   ALT 31 28 27 26   ALKPHOS 66 73 75 71  BILITOT 1.1 1.0 1.4* 1.0  PROT 5.8* 5.9* 5.8* 5.8*  ALBUMIN 2.4* 2.3* 2.3* 2.2*    No results for input(s): LIPASE, AMYLASE in the last 168 hours. Recent Labs  Lab 01/31/21 1106  AMMONIA 19     ABG    Component Value Date/Time   PHART 7.312 (L) 01/30/2021 1051   PCO2ART 67.6 (HH) 01/30/2021 1051   PO2ART 99 01/30/2021 1051   HCO3 40.2 (H) 01/31/2021 1745   TCO2 42 (H) 01/31/2021 1745   O2SAT 78.1 02/02/2021 0510     CRITICAL CARE Performed by: Kipp Brood   Total critical care time: 35 minutes  Critical care time was exclusive of separately billable procedures and treating other patients.  Critical care was necessary to treat or prevent imminent or life-threatening deterioration.  Critical care was time spent personally by me on the following activities: development of treatment plan with patient and/or surrogate as well as nursing, discussions with consultants, evaluation of patient's response to treatment, examination of patient, obtaining history from patient or surrogate, ordering and performing treatments and interventions, ordering and review of laboratory studies, ordering and review of radiographic studies, pulse oximetry, re-evaluation of patient's condition and participation in multidisciplinary rounds.  Kipp Brood, MD [facility] ICU Physician [place]  Pager: [phone] Mobile: [phone] After hours: [phone].

## 2021-02-02 NOTE — Progress Notes (Signed)
[facility]  Date of Admission:  01/29/2021     Total days of antibiotics 5         ASSESSMENT:  Mr. Gueye blood cultures from 10/21 and 10/22 have remained without growth to date. Continues to require vasopressor support. Will need line holiday/exchange when appropriate per CCM given line has been in place when he was bacteremic. Will also need TEE to check for endocarditis. Recommend narrowing antibiotics to Cefazolin as he has had 5 days of treatment for pneumonia with cefepime. Will need to continue Cefazolin pending TEE results to determine duration of therapy. Monitor cultures for continued clearance of bacteremia. Remaining medical and supportive care per primary team.   PLAN:  Recommend narrowing antibiotics to Cefazolin for MSSA Line holiday/exchange per CCM in setting of concern for line infection.  TEE when appropriate per Cardiology Monitor cultures for continued clearance of bacteremia. Remaining medical and supportive care per primary team.   Active Problems:   Cardiogenic shock (HCC)    acetaZOLAMIDE  250 mg Oral BID   Chlorhexidine Gluconate Cloth  6 each Topical Daily   heparin  5,000 Units Subcutaneous Q8H   insulin aspart  0-15 Units Subcutaneous TID WC   levothyroxine  50 mcg Oral Q0600   mouth rinse  15 mL Mouth Rinse BID   midodrine  10 mg Oral TID WC   polyethylene glycol  17 g Oral BID   pravastatin  10 mg Oral q1800   senna-docusate  1 tablet Oral BID    SUBJECTIVE:   Afebrile overnight with no acute events. Feeling better. Son at bedside.  No Known Allergies   Review of Systems: Review of Systems  Constitutional:  Negative for chills, fever and weight loss.  Respiratory:  Negative for cough, shortness of breath and wheezing.   Cardiovascular:  Negative for chest pain and leg swelling.  Gastrointestinal:  Negative for abdominal pain, constipation, diarrhea, nausea and vomiting.  Skin:  Negative for rash.      OBJECTIVE: Vitals:   02/02/21 1030 02/02/21 1100 02/02/21 1130 02/02/21 1200  BP: (!) 99/56 108/61 93/64 (!) 87/59  Pulse: 72 77 73 75  Resp: (!) 23 17 (!) 23 16  Temp:  98 F (36.7 C)    TempSrc:      SpO2: 93% 91% 94% 94%  Weight:       Body mass index is 43.84 kg/m.  Physical Exam Constitutional:      General: He is not in acute distress.    Appearance: He is well-developed. He is obese.     Interventions: Nasal cannula in place.  Cardiovascular:     Rate and Rhythm: Normal rate. Rhythm irregular.     Heart sounds: Normal heart sounds.  Pulmonary:     Effort: Pulmonary effort is normal. No accessory muscle usage or respiratory distress.     Breath sounds: Normal breath sounds.  Skin:    General: Skin is warm and dry.  Neurological:     Mental Status: He is alert and oriented to person, place, and time.  Psychiatric:        Behavior: Behavior normal.        Thought Content: Thought content normal.        Judgment: Judgment normal.    Lab Results Lab Results  Component Value Date   WBC 11.5 (H) 02/02/2021   HGB 13.4 02/02/2021   HCT 39.5 02/02/2021   MCV 85.7 02/02/2021   PLT 177 02/02/2021  Lab Results  Component Value Date   CREATININE 1.06 02/02/2021   BUN 63 (H) 02/02/2021   NA 130 (L) 02/02/2021   K 4.5 02/02/2021   CL 83 (L) 02/02/2021   CO2 39 (H) 02/02/2021    Lab Results  Component Value Date   ALT 26 02/02/2021   AST 29 02/02/2021   ALKPHOS 71 02/02/2021   BILITOT 1.0 02/02/2021     Microbiology: Recent Results (from the past 240 hour(s))  CULTURE, BLOOD (ROUTINE X 2) w Reflex to ID Panel     Status: Abnormal   Collection Time: 01/29/21  2:00 PM   Specimen: BLOOD  Result Value Ref Range Status   Specimen Description   Final    BLOOD LEFT ANTECUBITAL Performed at [facility], [address], [place], [place] [address]    Special Requests   Final    BOTTLES DRAWN AEROBIC AND ANAEROBIC Blood Culture adequate  volume Performed at [facility], [address] [place], [place] [address]    Culture  Setup Time   Final    IN BOTH AEROBIC AND ANAEROBIC BOTTLES GRAM POSITIVE COCCI CRITICAL RESULT CALLED TO, READ BACK BY AND VERIFIED WITH: GREGG ABBOTT @0345  ON 01/30/21 SKL Performed at [facility], [place] [address], [place], [place] [address]    Culture STAPHYLOCOCCUS AUREUS (A)  Final   Report Status 02/01/2021 FINAL  Final   Organism ID, Bacteria STAPHYLOCOCCUS AUREUS  Final      Susceptibility   Staphylococcus aureus - MIC*    CIPROFLOXACIN <=0.5 SENSITIVE Sensitive     ERYTHROMYCIN >=8 RESISTANT Resistant     GENTAMICIN <=0.5 SENSITIVE Sensitive     OXACILLIN <=0.25 SENSITIVE Sensitive     TETRACYCLINE <=1 SENSITIVE Sensitive     VANCOMYCIN <=0.5 SENSITIVE Sensitive     TRIMETH/SULFA <=10 SENSITIVE Sensitive     CLINDAMYCIN RESISTANT Resistant     RIFAMPIN <=0.5 SENSITIVE Sensitive     Inducible Clindamycin POSITIVE Resistant     * STAPHYLOCOCCUS AUREUS  Blood Culture ID Panel (Reflexed)     Status: Abnormal   Collection Time: 01/29/21  2:00 PM  Result Value Ref Range Status   Enterococcus faecalis NOT DETECTED NOT DETECTED Final   Enterococcus Faecium NOT DETECTED NOT DETECTED Final   Listeria monocytogenes NOT DETECTED NOT DETECTED Final   Staphylococcus species DETECTED (A) NOT DETECTED Final    Comment: CRITICAL RESULT CALLED TO, READ BACK BY AND VERIFIED WITH: GREGG ABBOTT @0345  ON 01/30/21 SKL    Staphylococcus aureus (BCID) DETECTED (A) NOT DETECTED Final    Comment: CRITICAL RESULT CALLED TO, READ BACK BY AND VERIFIED WITH: GREGG ABBOTT @0345  ON 01/30/21 SKL    Staphylococcus epidermidis NOT DETECTED NOT DETECTED Final   Staphylococcus lugdunensis NOT DETECTED NOT DETECTED Final   Streptococcus species NOT DETECTED NOT DETECTED Final   Streptococcus agalactiae NOT DETECTED NOT DETECTED Final   Streptococcus pneumoniae NOT DETECTED NOT DETECTED Final    Streptococcus pyogenes NOT DETECTED NOT DETECTED Final   A.calcoaceticus-baumannii NOT DETECTED NOT DETECTED Final   Bacteroides fragilis NOT DETECTED NOT DETECTED Final   Enterobacterales NOT DETECTED NOT DETECTED Final   Enterobacter cloacae complex NOT DETECTED NOT DETECTED Final   Escherichia coli NOT DETECTED NOT DETECTED Final   Klebsiella aerogenes NOT DETECTED NOT DETECTED Final   Klebsiella oxytoca NOT DETECTED NOT DETECTED Final   Klebsiella pneumoniae NOT DETECTED NOT DETECTED Final   Proteus species NOT DETECTED NOT DETECTED Final   Salmonella  species NOT DETECTED NOT DETECTED Final   Serratia marcescens NOT DETECTED NOT DETECTED Final   Haemophilus influenzae NOT DETECTED NOT DETECTED Final   Neisseria meningitidis NOT DETECTED NOT DETECTED Final   Pseudomonas aeruginosa NOT DETECTED NOT DETECTED Final   Stenotrophomonas maltophilia NOT DETECTED NOT DETECTED Final   Candida albicans NOT DETECTED NOT DETECTED Final   Candida auris NOT DETECTED NOT DETECTED Final   Candida glabrata NOT DETECTED NOT DETECTED Final   Candida krusei NOT DETECTED NOT DETECTED Final   Candida parapsilosis NOT DETECTED NOT DETECTED Final   Candida tropicalis NOT DETECTED NOT DETECTED Final   Cryptococcus neoformans/gattii NOT DETECTED NOT DETECTED Final   Meth resistant mecA/C and MREJ NOT DETECTED NOT DETECTED Final    Comment: Performed at [facility], [address] [place], [place] [address]  CULTURE, BLOOD (ROUTINE X 2) w Reflex to ID Panel     Status: Abnormal   Collection Time: 01/29/21  2:03 PM   Specimen: BLOOD  Result Value Ref Range Status   Specimen Description   Final    BLOOD BLOOD LEFT HAND Performed at [facility], [address], [place], [place] [address]    Special Requests   Final    BOTTLES DRAWN AEROBIC AND ANAEROBIC Blood Culture adequate volume Performed at [facility], [address] [place], [place] [address]    Culture  Setup Time    Final    IN BOTH AEROBIC AND ANAEROBIC BOTTLES GRAM POSITIVE COCCI CRITICAL VALUE NOTED.  VALUE IS CONSISTENT WITH PREVIOUSLY REPORTED AND CALLED VALUE. Performed at [facility], [address] [place], [place] [address]    Culture (A)  Final    STAPHYLOCOCCUS AUREUS SUSCEPTIBILITIES PERFORMED ON PREVIOUS CULTURE WITHIN THE LAST 5 DAYS. Performed at [facility], [place] [address] [place], [place] [address]    Report Status 02/01/2021 FINAL  Final  Urine Culture     Status: None   Collection Time: 01/29/21  8:38 PM   Specimen: Urine, Catheterized  Result Value Ref Range Status   Specimen Description URINE, CATHETERIZED  Final   Special Requests NONE  Final   Culture   Final    NO GROWTH Performed at [facility], [place] [address] [place], [place] [address]    Report Status 01/31/2021 FINAL  Final  Expectorated Sputum Assessment w Gram Stain, Rflx to Resp Cult     Status: None   Collection Time: 01/29/21  8:38 PM   Specimen: Sputum  Result Value Ref Range Status   Specimen Description SPUTUM  Final   Special Requests NONE  Final   Sputum evaluation   Final    Sputum specimen not acceptable for testing.  Please recollect.   RESULT CALLED TO, READ BACK BY AND VERIFIED WITH: RN KOBE 01/30/21@00 :36 Performed at [facility], [place] [address], [place], [place] [address]    Report Status 01/31/2021 FINAL  Final  Expectorated Sputum Assessment w Gram Stain, Rflx to Resp Cult     Status: None   Collection Time: 01/30/21  9:04 AM   Specimen: Sputum  Result Value Ref Range Status   Specimen Description SPUTUM  Final   Special Requests  EXPECTORATED  Final   Sputum evaluation   Final    THIS SPECIMEN IS ACCEPTABLE FOR SPUTUM CULTURE Performed at [facility], [place] [address], [place],  [address]    Report Status 01/30/2021 FINAL  Final  Culture, Respiratory w  Gram Stain     Status: None   Collection Time: 01/30/21  9:04 AM   Specimen: SPU   Result Value Ref Range Status   Specimen Description SPUTUM  Final   Special Requests  EXPECTORATED Reflexed from [id number]  Final   Gram Stain   Final    FEW SQUAMOUS EPITHELIAL CELLS PRESENT FEW WBC SEEN FEW GRAM POSITIVE COCCI    Culture   Final    FEW Consistent with normal respiratory flora. No Pseudomonas species isolated Performed at [place] [address] [place], [place] [address]    Report Status 02/02/2021 FINAL  Final  Culture, blood (routine x 2)     Status: None (Preliminary result)   Collection Time: 01/30/21 10:08 AM   Specimen: BLOOD RIGHT HAND  Result Value Ref Range Status   Specimen Description BLOOD RIGHT HAND  Final   Special Requests   Final    BOTTLES DRAWN AEROBIC AND ANAEROBIC Blood Culture adequate volume   Culture   Final    NO GROWTH 3 DAYS Performed at [facility], [place] [address] [place], [place] [address]    Report Status PENDING  Incomplete  Culture, blood (routine x 2)     Status: None (Preliminary result)   Collection Time: 01/30/21 10:09 AM   Specimen: BLOOD LEFT HAND  Result Value Ref Range Status   Specimen Description BLOOD LEFT HAND  Final   Special Requests   Final    BOTTLES DRAWN AEROBIC AND ANAEROBIC Blood Culture adequate volume   Culture   Final    NO GROWTH 3 DAYS Performed at [facility], [place] [address] [place], [place] [address]    Report Status PENDING  Incomplete  MRSA Next Gen by PCR, Nasal     Status: None   Collection Time: 01/30/21 11:01 AM   Specimen: Nasal Mucosa; Nasal Swab  Result Value Ref Range Status   MRSA by PCR Next Gen NOT DETECTED NOT DETECTED Final    Comment: (NOTE) The GeneXpert MRSA Assay (FDA approved for NASAL specimens only), is one component of a comprehensive MRSA colonization surveillance program. It is not intended to diagnose MRSA infection nor to guide or monitor treatment for MRSA infections. Test performance is not FDA approved in patients less than 62  years old. Performed at [facility], [place] [address], [place], [place] [address]   Culture, blood (routine x 2)     Status: None (Preliminary result)   Collection Time: 01/31/21  6:08 AM   Specimen: BLOOD RIGHT HAND  Result Value Ref Range Status   Specimen Description BLOOD RIGHT HAND  Final   Special Requests   Final    AEROBIC BOTTLE ONLY Blood Culture results may not be optimal due to an inadequate volume of blood received in culture bottles   Culture   Final    NO GROWTH 2 DAYS Performed at [facility], [place] [address] [place], [place] [address]    Report Status PENDING  Incomplete  Culture, blood (routine x 2)     Status: None (Preliminary result)   Collection Time: 01/31/21  3:06 PM   Specimen: BLOOD  Result Value Ref Range Status   Specimen Description BLOOD SITE NOT SPECIFIED  Final   Special Requests   Final    BOTTLES DRAWN AEROBIC ONLY Blood Culture results may not be optimal due to an inadequate volume of blood received in culture bottles   Culture  Final    NO GROWTH 2 DAYS Performed at [facility], [place] [address], [place],  [address]    Report Status PENDING  Incomplete     Terri Piedra, [place] [facility]  02/02/2021  1:15 PM

## 2021-02-03 ENCOUNTER — Inpatient Hospital Stay (HOSPITAL_COMMUNITY): Payer: Medicare HMO

## 2021-02-03 DIAGNOSIS — B9561 Methicillin susceptible Staphylococcus aureus infection as the cause of diseases classified elsewhere: Secondary | ICD-10-CM

## 2021-02-03 DIAGNOSIS — J15211 Pneumonia due to Methicillin susceptible Staphylococcus aureus: Secondary | ICD-10-CM | POA: Diagnosis not present

## 2021-02-03 DIAGNOSIS — R7881 Bacteremia: Secondary | ICD-10-CM

## 2021-02-03 DIAGNOSIS — T80219A Unspecified infection due to central venous catheter, initial encounter: Secondary | ICD-10-CM

## 2021-02-03 HISTORY — DX: Methicillin susceptible Staphylococcus aureus infection as the cause of diseases classified elsewhere: B95.61

## 2021-02-03 LAB — CBC
HCT: 38.5 % — ABNORMAL LOW (ref 39.0–52.0)
Hemoglobin: 13 g/dL (ref 13.0–17.0)
MCH: 28.7 pg (ref 26.0–34.0)
MCHC: 33.8 g/dL (ref 30.0–36.0)
MCV: 85 fL (ref 80.0–100.0)
Platelets: 209 10*3/uL (ref 150–400)
RBC: 4.53 MIL/uL (ref 4.22–5.81)
RDW: 15.1 % (ref 11.5–15.5)
WBC: 13.5 10*3/uL — ABNORMAL HIGH (ref 4.0–10.5)
nRBC: 0 % (ref 0.0–0.2)

## 2021-02-03 LAB — GLUCOSE, CAPILLARY
Glucose-Capillary: 137 mg/dL — ABNORMAL HIGH (ref 70–99)
Glucose-Capillary: 118 mg/dL — ABNORMAL HIGH (ref 70–99)
Glucose-Capillary: 98 mg/dL (ref 70–99)
Glucose-Capillary: 127 mg/dL — ABNORMAL HIGH (ref 70–99)
Glucose-Capillary: 118 mg/dL — ABNORMAL HIGH (ref 70–99)

## 2021-02-03 LAB — COMPREHENSIVE METABOLIC PANEL
ALT: 25 U/L (ref 0–44)
AST: 28 U/L (ref 15–41)
Albumin: 2.1 g/dL — ABNORMAL LOW (ref 3.5–5.0)
Alkaline Phosphatase: 83 U/L (ref 38–126)
Anion gap: 8 (ref 5–15)
BUN: 54 mg/dL — ABNORMAL HIGH (ref 8–23)
CO2: 37 mmol/L — ABNORMAL HIGH (ref 22–32)
Calcium: 8.3 mg/dL — ABNORMAL LOW (ref 8.9–10.3)
Chloride: 82 mmol/L — ABNORMAL LOW (ref 98–111)
Creatinine, Ser: 1.06 mg/dL (ref 0.61–1.24)
GFR, Estimated: >60 mL/min (ref >60)
Glucose, Bld: 127 mg/dL — ABNORMAL HIGH (ref 70–99)
Potassium: 3.7 mmol/L (ref 3.5–5.1)
Sodium: 127 mmol/L — ABNORMAL LOW (ref 135–145)
Total Bilirubin: 1 mg/dL (ref 0.3–1.2)
Total Protein: 5.7 g/dL — ABNORMAL LOW (ref 6.5–8.1)

## 2021-02-03 LAB — COOXEMETRY PANEL
Carboxyhemoglobin: 1 % (ref 0.5–1.5)
Methemoglobin: 0.8 % (ref 0.0–1.5)
O2 Saturation: 68 %
Total hemoglobin: 13.2 g/dL (ref 12.0–16.0)

## 2021-02-03 LAB — MAGNESIUM: Magnesium: 2 mg/dL (ref 1.7–2.4)

## 2021-02-03 LAB — PHOSPHORUS: Phosphorus: 3.2 mg/dL (ref 2.5–4.6)

## 2021-02-03 MED ORDER — SORBITOL 70 % SOLN
30.0000 mL | Freq: Once | Status: AC
  Administered 2021-02-03: 30 mL via ORAL
  Filled 2021-02-03: qty 30

## 2021-02-03 MED ORDER — POTASSIUM CHLORIDE CRYS ER 20 MEQ PO TBCR
40.0000 meq | EXTENDED_RELEASE_TABLET | Freq: Once | ORAL | Status: AC
  Administered 2021-02-03: 40 meq via ORAL
  Filled 2021-02-03: qty 2

## 2021-02-03 MED ORDER — SODIUM CHLORIDE 0.9 % IV SOLN: INTRAVENOUS | Status: DC

## 2021-02-03 MED ORDER — FUROSEMIDE 10 MG/ML IJ SOLN
40.0000 mg | Freq: Two times a day (BID) | INTRAMUSCULAR | Status: DC
  Administered 2021-02-03 – 2021-02-04 (×3): 40 mg via INTRAVENOUS
  Filled 2021-02-03 (×3): qty 4

## 2021-02-03 NOTE — Progress Notes (Signed)
While changing central line dressing, RN noted line was 50 cm out and not sutured in. Dr. Lynetta Mare notified and Chest Xray ordered.

## 2021-02-03 NOTE — Progress Notes (Addendum)
Advanced Heart Failure Rounding Note   Subjective:    Admit weight 325 pounds --> 302 pounds.   NE weaned off yesterday with support of midodrine. Remains on milrinone 0.125., lasix drip 5 mg per hour. Weight down 3 more pounds. CO-OX 68%  JVP 9-10  On cefazolin for MSSA bacteremia. Afebrile  No BM yet.   Breathing better, denies CP, orthopnea or PND. Feels weak. PT recommending SNF   Objective:   Weight Range:  Vital Signs:   Temp:  [97.6 F (36.4 C)-98 F (36.7 C)] 97.6 F (36.4 C) (10/25 0400) Pulse Rate:  [34-88] 82 (10/25 0500) Resp:  [14-23] 17 (10/25 0500) BP: (86-124)/(39-92) 107/39 (10/25 0500) SpO2:  [89 %-96 %] 95 % (10/25 0500) Weight:  [366 kg] 137 kg (10/25 0500) Last BM Date: 01/25/21  Weight change: Filed Weights   02/01/21 0500 02/02/21 0500 02/03/21 0500  Weight: (!) 139.1 kg (!) 138.6 kg (!) 137 kg    Intake/Output:   Intake/Output Summary (Last 24 hours) at 02/03/2021 0654 Last data filed at 02/03/2021 0500 Gross per 24 hour  Intake 1137.56 ml  Output 3710 ml  Net -2572.44 ml      Physical Exam: General:  Obese male lying in bed  No resp difficulty HEENT: normal Neck: supple. JVP 9-10  Carotids 2+ bilat; no bruits. No lymphadenopathy or thryomegaly appreciated. Cor: PMI nondisplaced. Irregular rate & rhythm. No rubs, gallops or murmurs. Lungs: clear Abdomen: soft, nontender, nondistended. No hepatosplenomegaly. No bruits or masses. Good bowel sounds. Extremities: no cyanosis, clubbing, rash, 1+ edema Neuro: alert & orientedx3, cranial nerves grossly intact. moves all 4 extremities w/o difficulty. Affect pleasant   Telemetry: Sinus with frequent multifocal PVCs Personally reviewed   Labs: Basic Metabolic Panel: Recent Labs  Lab 01/30/21 0526 01/30/21 1051 01/30/21 1715 01/31/21 0500 01/31/21 1745 02/01/21 0515 02/02/21 0510 02/03/21 0517  NA 122*   < > 122* 124* 126*  126* 129* 130* 127*  K 4.0   < > 4.2 3.8 3.8  3.9  4.1 4.5 3.7  CL 80*  --  80* 81* 81* 82* 83* 82*  CO2 32  --  29 33* 36* 37* 39* 37*  GLUCOSE 149*  --  139* 119* 162* 123* 114* 127*  BUN 85*  --  82* 76* 76* 71* 63* 54*  CREATININE 2.08*  --  1.79* 1.65* 1.43* 1.19 1.06 1.06  CALCIUM 8.0*  --  8.1* 7.9* 8.2* 8.5* 8.4* 8.3*  MG 2.4  --  2.4 2.2  --  2.1 2.2 2.0  PHOS 4.6  --   --  3.2  --  2.6 2.8 3.2   < > = values in this interval not displayed.     Liver Function Tests: Recent Labs  Lab 01/30/21 0526 01/31/21 0500 02/01/21 0515 02/02/21 0510 02/03/21 0517  AST 30 26 24 29 28   ALT 31 28 27 26 25   ALKPHOS 66 73 75 71 83  BILITOT 1.1 1.0 1.4* 1.0 1.0  PROT 5.8* 5.9* 5.8* 5.8* 5.7*  ALBUMIN 2.4* 2.3* 2.3* 2.2* 2.1*    No results for input(s): LIPASE, AMYLASE in the last 168 hours. Recent Labs  Lab 01/31/21 1106  AMMONIA 19     CBC: Recent Labs  Lab 01/29/21 0602 01/30/21 0526 01/30/21 1051 01/31/21 0500 01/31/21 1745 02/01/21 0515 02/02/21 0510 02/03/21 0517  WBC 10.0 12.2*  --  13.7*  --  12.5* 11.5* 13.5*  NEUTROABS 8.7*  --   --   --   --   --   --   --  HGB 12.1* 12.4*   < > 13.1 14.3 13.1 13.4 13.0  HCT 35.1* 36.9*   < > 38.6* 42.0 39.3 39.5 38.5*  MCV 86.0 87.0  --  84.6  --  85.1 85.7 85.0  PLT 104* 106*  --  132*  --  153 177 209   < > = values in this interval not displayed.     Cardiac Enzymes: No results for input(s): CKTOTAL, CKMB, CKMBINDEX, TROPONINI in the last 168 hours.  BNP: BNP (last 3 results) Recent Labs    01/19/21 1237 01/27/21 0447 01/29/21 2020  BNP 942.4* 681.0* 333.2*     ProBNP (last 3 results) No results for input(s): PROBNP in the last 8760 hours.    Other results:  Imaging: No results found.   Medications:     Scheduled Medications:  acetaZOLAMIDE  250 mg Oral BID   Chlorhexidine Gluconate Cloth  6 each Topical Daily   heparin  5,000 Units Subcutaneous Q8H   insulin aspart  0-15 Units Subcutaneous TID WC   levothyroxine  50 mcg Oral Q0600    mouth rinse  15 mL Mouth Rinse BID   midodrine  10 mg Oral TID WC   polyethylene glycol  17 g Oral BID   potassium chloride  40 mEq Oral Once   pravastatin  10 mg Oral q1800   senna-docusate  1 tablet Oral BID    Infusions:  amiodarone 30 mg/hr (02/03/21 0500)    ceFAZolin (ANCEF) IV 2 g (02/03/21 0519)   furosemide (LASIX) 200 mg in dextrose 5% 100 mL (2mg /mL) infusion 5 mg/hr (02/03/21 0500)   milrinone 0.125 mcg/kg/min (02/03/21 0500)   norepinephrine (LEVOPHED) Adult infusion Stopped (02/02/21 0931)    PRN Medications: acetaminophen, docusate sodium, ondansetron (ZOFRAN) IV, polyethylene glycol   Assessment/Plan:   1. Shock - Cardiogenic/Septic  - Initial presentation consistent with cardiogenic shock but now with septic overlay with PNA and MSSA bacteremia - Procalcitonin 0.8  Lactic Acid 1.2 . RHC cardiac index 1.5  - BCx 2/2 MSSA. Urine Cx negative - Now on Ancef.  - Off NE. Remains on milrinone and midodrine. Co-ox ok.    2. Acute Systolic Heart Failure  - Echo EF now down from 55-->20-25%. Cath with no CAD, elevated filling pressures and low output heart failure.  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  - Continues with high PVC burden despite IV amio. Susepct PVC induced cardiomyopathy. Continue amio to suppress burden - Co-ox 68% on milrinone 0.125. Continue today - Diuresing well. Weight down 23 pounds.  - Continue lasix drip + diamox at least one more day - No room for GDMT with hypotension.    3. Acute Hypoxic/Hypercarbia Respiratory Failure  - Improved with volume removal. Continue to wean oxygen as needed.    4. NSVT, PVC  -High PVC burden On amio drip. PVCs burden reduced. Continue amio. May need mexilitene  Keep K> 4.0 Mg>2.0 - outpatient sleep study  5. MSSA bacteremia - likely respiratory source - now on ancef. ID following. Will need line holiday before placing Home  IV access - TEE scheduled Thursday at 1130 based on endo availability   6. AKI  - Creatinine peaked at 2.1 , now normalized at 1.06   7. Hyponatremia - Sodium 130 -> 127 - Restrict free water  8. Morbid Obesity   Body mass index is 43.34 kg/m.  9.  AMS - ABC with CO2 retention  10. Suspected Sleep Apnea -will need outpatient sleep study  11. Hypothyroidism  Check TSH Start home dose of levothyroixine.    12. Hypokalemia - supp   PT recommending SNF.   Likely can go to stepdown.  Will d/w CCM    Length of Stay: 5   Glori Bickers MD 02/03/2021, 6:54 AM  Advanced Heart Failure Team Pager [phone] (M-F; [place])  Please contact [facility] for night-coverage after hours (4p -7a ) and weekends on [email or web address]

## 2021-02-03 NOTE — Progress Notes (Signed)
NAME:  Anthony Weber, MRN:  [id number], DOB:  18-Mar-1946, LOS: 5 ADMISSION DATE:  01/29/2021, CONSULTATION DATE:  01/29/21 REFERRING MD:  Mortimer Fries -[facility] CCM, CHIEF COMPLAINT:  Cardiogenic shock   History of Present Illness:  75 yo M PMH morbid obesity, HTN, HLD, hypothyroidism, HFrEF who presented to [facility] ED 10/9 with CC BLE swelling. He reportedly gained 15lb over 1wk prior to presentation, had incr DOE. He was admitted to hospitalist service for acute heart failure exacerbation. Cardiology was consulted 10/10 and recommended diuresis, lisonopril and repeat ECHO which revealed LVEF 25-30% with indeterminate LV diastolic parameters and poorly visualized RV systolic function.  He received ongoing medication optimization and went for [facility] 10/18 which showed no significant CAD, moderately elevated L heart and PA pressure, severely elevated R Heart pressures. He was transferred to the ICU 10/18 and started on a milrinone infusion for cardiogenic shock.   On 10/20 the pt was started on a lasix gtt for hypoxia in setting of cardiogenic shock and a plan was made to transfer the patient to [place] for further management and care.   Pertinent  Medical History  HTN HFrEF HLD Morbid obesity Hypothyroidism   Significant Hospital Events: Including procedures, antibiotic start and stop dates in addition to other pertinent events   10/9 admitted to [facility] with likely HF exacerbation. Diuresed  10/10 cards consulted for HF recs  10/18 R/LHC without significant CAD, but had incr L heart, PA, and R heart pressures. Transferred to ICU, started on milronone> developed hypotension and NSVT> milrinone reduced and started norepi and amiodarone.  10/20 Transferred to [facility] 10/21 MSSA in blood cultures, A-line placed and removed overnight 10/24 pressors weaned off.   Interim History / Subjective:  Tolerating BiPAP at night. Weaned off vasopressors.   Objective   Blood pressure 109/81, pulse 81,  temperature 97.6 F (36.4 C), temperature source Oral, resp. rate (!) 22, weight (!) 137 kg, SpO2 96 %. CVP:  [0 mmHg-15 mmHg] 7 mmHg      Intake/Output Summary (Last 24 hours) at 02/03/2021 0900 Last data filed at 02/03/2021 0500 Gross per 24 hour  Intake 1084.44 ml  Output 3710 ml  Net -2625.56 ml    Filed Weights   02/01/21 0500 02/02/21 0500 02/03/21 0500  Weight: (!) 139.1 kg (!) 138.6 kg (!) 137 kg   General: obese man, in no distress.  HEENT: [place]/AT, eyes anicteric Cardio: S1S2, irreg rhythm, frequent PVCs Resp: breathing comfortably on [place], no accessory muscle use, no conversational dyspnea.  Clear at both bases.  Abd: obese, soft, NT Extremities: legs wrapped, ongoing edema mainly in dependent area Derm: warm,  Neuro: Awake follows commands. No focal deficits.   Resolved Hospital Problem list     Assessment & Plan:   Critically ill due to acute respiratory failure with hypoxia and hypercapnea due to pneumonia and decompensated biventricular failure requiring noninvasive ventilation Bacterial pneumonia suspect MSSA MSSA bacteremia due to above Critically ill due to cardiogenic shock requiring titration of norepinephrine and milrinone Acute on chronic HFrEF  NSVT, frequent PVCs requiring milrinone Acute metabolic encephalopathy due to sepsis and hypercapnia Contraction alkalosis Now hypovolemic hyponatremia  Plan:  -Continue nocturnal BiPAP and daytime BiPAP as needed -Wean FiO2 as tolerated.   -Continue antibiotics for MSSA bacteremia.  ID is following.  TEE 10/27 -Continue milrinone at current dose.  Continue midodrine.  -Holding GDHFT due to hypotension. Would need to come off both midodrine and milrinone prior to re-initiating -Given signs of intravascular  volume contraction, will slow rate of net diuresis and allow more gradual clearance of remaining peripheral edema. Switched to furosemide 40 bid and acetazolamide with goal to remove 1.5L/d -Continue  amiodarone IV. Consider introducing beta-blocker.   Best Practice (right click and "Reselect all SmartList Selections" daily)   Diet/type: Regular consistency (see orders) DVT prophylaxis: prophylactic heparin  GI prophylaxis: N/A Lines: Central line and yes and it is still needed- can come out when off pressors [place]:  Yes, and it is still needed Code Status:  full code Last date of multidisciplinary goals of care discussion [10/20, met with palliative care]  Labs   CBC: Recent Labs  Lab 01/29/21 0602 01/30/21 0526 01/30/21 1051 01/31/21 0500 01/31/21 1745 02/01/21 0515 02/02/21 0510 02/03/21 0517  WBC 10.0 12.2*  --  13.7*  --  12.5* 11.5* 13.5*  NEUTROABS 8.7*  --   --   --   --   --   --   --   HGB 12.1* 12.4*   < > 13.1 14.3 13.1 13.4 13.0  HCT 35.1* 36.9*   < > 38.6* 42.0 39.3 39.5 38.5*  MCV 86.0 87.0  --  84.6  --  85.1 85.7 85.0  PLT 104* 106*  --  132*  --  153 177 209   < > = values in this interval not displayed.     Basic Metabolic Panel: Recent Labs  Lab 01/30/21 0526 01/30/21 1051 01/30/21 1715 01/31/21 0500 01/31/21 1745 02/01/21 0515 02/02/21 0510 02/03/21 0517  NA 122*   < > 122* 124* 126*  126* 129* 130* 127*  K 4.0   < > 4.2 3.8 3.8  3.9 4.1 4.5 3.7  CL 80*  --  80* 81* 81* 82* 83* 82*  CO2 32  --  29 33* 36* 37* 39* 37*  GLUCOSE 149*  --  139* 119* 162* 123* 114* 127*  BUN 85*  --  82* 76* 76* 71* 63* 54*  CREATININE 2.08*  --  1.79* 1.65* 1.43* 1.19 1.06 1.06  CALCIUM 8.0*  --  8.1* 7.9* 8.2* 8.5* 8.4* 8.3*  MG 2.4  --  2.4 2.2  --  2.1 2.2 2.0  PHOS 4.6  --   --  3.2  --  2.6 2.8 3.2   < > = values in this interval not displayed.    GFR: Estimated Creatinine Clearance: 82.7 mL/min (by C-G formula based on SCr of 1.06 mg/dL). Recent Labs  Lab 01/28/21 1118 01/29/21 0602 01/29/21 1124 01/29/21 2020 01/30/21 0526 01/31/21 0500 02/01/21 0515 02/02/21 0510 02/03/21 0517  PROCALCITON  --   --  0.74 0.80  --   --   --   --   --    WBC  --    < >  --   --    < > 13.7* 12.5* 11.5* 13.5*  LATICACIDVEN 1.3  --   --  1.2  --   --   --   --   --    < > = values in this interval not displayed.     Liver Function Tests: Recent Labs  Lab 01/30/21 0526 01/31/21 0500 02/01/21 0515 02/02/21 0510 02/03/21 0517  AST _0 ALT _1 ALKPHOS 66 73 75 71 83  BILITOT 1.1 1.0 1.4* 1.0 1.0  PROT 5.8* 5.9* 5.8* 5.8* 5.7*  ALBUMIN 2.4* 2.3* 2.3* 2.2* 2.1*    No results for input(s): LIPASE, AMYLASE  in the last 168 hours. Recent Labs  Lab 01/31/21 1106  AMMONIA 19     ABG    Component Value Date/Time   PHART 7.312 (L) 01/30/2021 1051   PCO2ART 67.6 (HH) 01/30/2021 1051   PO2ART 99 01/30/2021 1051   HCO3 40.2 (H) 01/31/2021 1745   TCO2 42 (H) 01/31/2021 1745   O2SAT 68.0 02/03/2021 0517     CRITICAL CARE Performed by: Kipp Brood   Total critical care time: 40 minutes  Critical care time was exclusive of separately billable procedures and treating other patients.  Critical care was necessary to treat or prevent imminent or life-threatening deterioration.  Critical care was time spent personally by me on the following activities: development of treatment plan with patient and/or surrogate as well as nursing, discussions with consultants, evaluation of patient's response to treatment, examination of patient, obtaining history from patient or surrogate, ordering and performing treatments and interventions, ordering and review of laboratory studies, ordering and review of radiographic studies, pulse oximetry, re-evaluation of patient's condition and participation in multidisciplinary rounds.  Kipp Brood, MD [facility] ICU Physician [place]  Pager: [phone] Mobile: [phone] After hours: [phone].

## 2021-02-03 NOTE — Progress Notes (Signed)
[facility] for Infectious Disease  Date of Admission:  01/29/2021     Total days of antibiotics 6         ASSESSMENT:  Anthony Weber blood cultures from 01/31/21 have remained clear. Narrowed to Cefazolin for MSSA bacteremia. Potential TEE planned for Friday per nursing. Will need central line holiday/exchange when appropriate per CCM and Cardiology. Suspected source remains the central line catheter. He will require at least 4 weeks of antibiotics. Continue current dose of Cefazolin. Remaining medical and supportive care per primary team.   PLAN:  Continue current dose of Cefazolin. Monitor blood cultures for clearance of bacteremia.  Await TEE results. Central line holiday/exchange per CCM/Cardiology. Remaining medical and supportive care per Primary Team and Cardiology.   Active Problems:   Cardiogenic shock (HCC)   MSSA bacteremia    acetaZOLAMIDE  250 mg Oral BID   Chlorhexidine Gluconate Cloth  6 each Topical Daily   furosemide  40 mg Intravenous BID   heparin  5,000 Units Subcutaneous Q8H   insulin aspart  0-15 Units Subcutaneous TID WC   levothyroxine  50 mcg Oral Q0600   mouth rinse  15 mL Mouth Rinse BID   midodrine  10 mg Oral TID WC   polyethylene glycol  17 g Oral BID   pravastatin  10 mg Oral q1800   senna-docusate  1 tablet Oral BID    SUBJECTIVE:  Afebrile overnight with no acute events. No new concerns/complaints. Family updated at bedside.   No Known Allergies   Review of Systems: Review of Systems  Constitutional:  Negative for chills, fever and weight loss.  Respiratory:  Negative for cough, shortness of breath and wheezing.   Cardiovascular:  Negative for chest pain and leg swelling.  Gastrointestinal:  Negative for abdominal pain, constipation, diarrhea, nausea and vomiting.  Skin:  Negative for rash.     OBJECTIVE: Vitals:   02/03/21 0900 02/03/21 1000 02/03/21 1100 02/03/21 1200  BP: (!) 105/47 117/75 (!) 101/52   Pulse: (!)  37 (!) 40 69 80  Resp: (!) 38 14 15 (!) 25  Temp:   97.8 F (36.6 C)   TempSrc:      SpO2: 95% 96% 94% 94%  Weight:       Body mass index is 43.34 kg/m.  Physical Exam Constitutional:      General: He is not in acute distress.    Appearance: He is well-developed.  Cardiovascular:     Rate and Rhythm: Normal rate. Rhythm irregular.     Heart sounds: Normal heart sounds.  Pulmonary:     Effort: Pulmonary effort is normal.     Breath sounds: Normal breath sounds.  Skin:    General: Skin is warm and dry.  Neurological:     Mental Status: He is alert and oriented to person, place, and time.  Psychiatric:        Behavior: Behavior normal.        Thought Content: Thought content normal.        Judgment: Judgment normal.    Lab Results Lab Results  Component Value Date   WBC 13.5 (H) 02/03/2021   HGB 13.0 02/03/2021   HCT 38.5 (L) 02/03/2021   MCV 85.0 02/03/2021   PLT 209 02/03/2021    Lab Results  Component Value Date   CREATININE 1.06 02/03/2021   BUN 54 (H) 02/03/2021   NA 127 (L) 02/03/2021   K 3.7 02/03/2021   CL 82 (L) 02/03/2021  CO2 37 (H) 02/03/2021    Lab Results  Component Value Date   ALT 25 02/03/2021   AST 28 02/03/2021   ALKPHOS 83 02/03/2021   BILITOT 1.0 02/03/2021     Microbiology: Recent Results (from the past 240 hour(s))  CULTURE, BLOOD (ROUTINE X 2) w Reflex to ID Panel     Status: Abnormal   Collection Time: 01/29/21  2:00 PM   Specimen: BLOOD  Result Value Ref Range Status   Specimen Description   Final    BLOOD LEFT ANTECUBITAL Performed at [facility], [address] [place], [place] [address]    Special Requests   Final    BOTTLES DRAWN AEROBIC AND ANAEROBIC Blood Culture adequate volume Performed at [facility], [address] [place], [place] [address]    Culture  Setup Time   Final    IN BOTH AEROBIC AND ANAEROBIC BOTTLES GRAM POSITIVE COCCI CRITICAL RESULT CALLED TO, READ BACK BY AND VERIFIED  WITH: GREGG ABBOTT @0345  ON 01/30/21 SKL Performed at [facility], [place] [address], [place], [place] [address]    Culture STAPHYLOCOCCUS AUREUS (A)  Final   Report Status 02/01/2021 FINAL  Final   Organism ID, Bacteria STAPHYLOCOCCUS AUREUS  Final      Susceptibility   Staphylococcus aureus - MIC*    CIPROFLOXACIN <=0.5 SENSITIVE Sensitive     ERYTHROMYCIN >=8 RESISTANT Resistant     GENTAMICIN <=0.5 SENSITIVE Sensitive     OXACILLIN <=0.25 SENSITIVE Sensitive     TETRACYCLINE <=1 SENSITIVE Sensitive     VANCOMYCIN <=0.5 SENSITIVE Sensitive     TRIMETH/SULFA <=10 SENSITIVE Sensitive     CLINDAMYCIN RESISTANT Resistant     RIFAMPIN <=0.5 SENSITIVE Sensitive     Inducible Clindamycin POSITIVE Resistant     * STAPHYLOCOCCUS AUREUS  Blood Culture ID Panel (Reflexed)     Status: Abnormal   Collection Time: 01/29/21  2:00 PM  Result Value Ref Range Status   Enterococcus faecalis NOT DETECTED NOT DETECTED Final   Enterococcus Faecium NOT DETECTED NOT DETECTED Final   Listeria monocytogenes NOT DETECTED NOT DETECTED Final   Staphylococcus species DETECTED (A) NOT DETECTED Final    Comment: CRITICAL RESULT CALLED TO, READ BACK BY AND VERIFIED WITH: GREGG ABBOTT @0345  ON 01/30/21 SKL    Staphylococcus aureus (BCID) DETECTED (A) NOT DETECTED Final    Comment: CRITICAL RESULT CALLED TO, READ BACK BY AND VERIFIED WITH: GREGG ABBOTT @0345  ON 01/30/21 SKL    Staphylococcus epidermidis NOT DETECTED NOT DETECTED Final   Staphylococcus lugdunensis NOT DETECTED NOT DETECTED Final   Streptococcus species NOT DETECTED NOT DETECTED Final   Streptococcus agalactiae NOT DETECTED NOT DETECTED Final   Streptococcus pneumoniae NOT DETECTED NOT DETECTED Final   Streptococcus pyogenes NOT DETECTED NOT DETECTED Final   A.calcoaceticus-baumannii NOT DETECTED NOT DETECTED Final   Bacteroides fragilis NOT DETECTED NOT DETECTED Final   Enterobacterales NOT DETECTED NOT DETECTED Final   Enterobacter  cloacae complex NOT DETECTED NOT DETECTED Final   Escherichia coli NOT DETECTED NOT DETECTED Final   Klebsiella aerogenes NOT DETECTED NOT DETECTED Final   Klebsiella oxytoca NOT DETECTED NOT DETECTED Final   Klebsiella pneumoniae NOT DETECTED NOT DETECTED Final   Proteus species NOT DETECTED NOT DETECTED Final   Salmonella species NOT DETECTED NOT DETECTED Final   Serratia marcescens NOT DETECTED NOT DETECTED Final   Haemophilus influenzae NOT DETECTED NOT DETECTED Final   Neisseria meningitidis NOT DETECTED NOT DETECTED Final   Pseudomonas  aeruginosa NOT DETECTED NOT DETECTED Final   Stenotrophomonas maltophilia NOT DETECTED NOT DETECTED Final   Candida albicans NOT DETECTED NOT DETECTED Final   Candida auris NOT DETECTED NOT DETECTED Final   Candida glabrata NOT DETECTED NOT DETECTED Final   Candida krusei NOT DETECTED NOT DETECTED Final   Candida parapsilosis NOT DETECTED NOT DETECTED Final   Candida tropicalis NOT DETECTED NOT DETECTED Final   Cryptococcus neoformans/gattii NOT DETECTED NOT DETECTED Final   Meth resistant mecA/C and MREJ NOT DETECTED NOT DETECTED Final    Comment: Performed at [facility], [address] [place], [place] [address]  CULTURE, BLOOD (ROUTINE X 2) w Reflex to ID Panel     Status: Abnormal   Collection Time: 01/29/21  2:03 PM   Specimen: BLOOD  Result Value Ref Range Status   Specimen Description   Final    BLOOD BLOOD LEFT HAND Performed at [facility], [address], [place], [place] [address]    Special Requests   Final    BOTTLES DRAWN AEROBIC AND ANAEROBIC Blood Culture adequate volume Performed at [facility], [address] [place], [place] [address]    Culture  Setup Time   Final    IN BOTH AEROBIC AND ANAEROBIC BOTTLES GRAM POSITIVE COCCI CRITICAL VALUE NOTED.  VALUE IS CONSISTENT WITH PREVIOUSLY REPORTED AND CALLED VALUE. Performed at [facility], [address] [place], [place] [address]     Culture (A)  Final    STAPHYLOCOCCUS AUREUS SUSCEPTIBILITIES PERFORMED ON PREVIOUS CULTURE WITHIN THE LAST 5 DAYS. Performed at [facility], [place] [address], [place], [place] [address]    Report Status 02/01/2021 FINAL  Final  Urine Culture     Status: None   Collection Time: 01/29/21  8:38 PM   Specimen: Urine, Catheterized  Result Value Ref Range Status   Specimen Description URINE, CATHETERIZED  Final   Special Requests NONE  Final   Culture   Final    NO GROWTH Performed at [facility], [place] [address], [place], [place] [address]    Report Status 01/31/2021 FINAL  Final  Expectorated Sputum Assessment w Gram Stain, Rflx to Resp Cult     Status: None   Collection Time: 01/29/21  8:38 PM   Specimen: Sputum  Result Value Ref Range Status   Specimen Description SPUTUM  Final   Special Requests NONE  Final   Sputum evaluation   Final    Sputum specimen not acceptable for testing.  Please recollect.   RESULT CALLED TO, READ BACK BY AND VERIFIED WITH: RN KOBE 01/30/21@00 :36 Performed at [facility], [place] [address] [place], [place] [address]    Report Status 01/31/2021 FINAL  Final  Expectorated Sputum Assessment w Gram Stain, Rflx to Resp Cult     Status: None   Collection Time: 01/30/21  9:04 AM   Specimen: Sputum  Result Value Ref Range Status   Specimen Description SPUTUM  Final   Special Requests  EXPECTORATED  Final   Sputum evaluation   Final    THIS SPECIMEN IS ACCEPTABLE FOR SPUTUM CULTURE Performed at [facility], [place] [address], [place], [place] [address]    Report Status 01/30/2021 FINAL  Final  Culture, Respiratory w Gram Stain     Status: None   Collection Time: 01/30/21  9:04 AM   Specimen: SPU  Result Value Ref Range Status   Specimen Description SPUTUM  Final   Special  Requests  EXPECTORATED Reflexed from [id number]  Final   Gram Stain   Final    FEW SQUAMOUS EPITHELIAL CELLS PRESENT FEW WBC SEEN FEW GRAM POSITIVE COCCI     Culture   Final    FEW Consistent with normal respiratory flora. No Pseudomonas species isolated Performed at [place] [address], [place], [place] [address]    Report Status 02/02/2021 FINAL  Final  Culture, blood (routine x 2)     Status: None (Preliminary result)   Collection Time: 01/30/21 10:08 AM   Specimen: BLOOD RIGHT HAND  Result Value Ref Range Status   Specimen Description BLOOD RIGHT HAND  Final   Special Requests   Final    BOTTLES DRAWN AEROBIC AND ANAEROBIC Blood Culture adequate volume   Culture   Final    NO GROWTH 4 DAYS Performed at [facility], [place] [address] [place], [place] [address]    Report Status PENDING  Incomplete  Culture, blood (routine x 2)     Status: None (Preliminary result)   Collection Time: 01/30/21 10:09 AM   Specimen: BLOOD LEFT HAND  Result Value Ref Range Status   Specimen Description BLOOD LEFT HAND  Final   Special Requests   Final    BOTTLES DRAWN AEROBIC AND ANAEROBIC Blood Culture adequate volume   Culture   Final    NO GROWTH 4 DAYS Performed at [facility], [place] [address], [place], [place] [address]    Report Status PENDING  Incomplete  MRSA Next Gen by PCR, Nasal     Status: None   Collection Time: 01/30/21 11:01 AM   Specimen: Nasal Mucosa; Nasal Swab  Result Value Ref Range Status   MRSA by PCR Next Gen NOT DETECTED NOT DETECTED Final    Comment: (NOTE) The GeneXpert MRSA Assay (FDA approved for NASAL specimens only), is one component of a comprehensive MRSA colonization surveillance program. It is not intended to diagnose MRSA infection nor to guide or monitor treatment for MRSA infections. Test performance is not FDA approved in patients less than 54 years old. Performed at [facility], [place] [address] [place], [place] [address]   Culture, blood (routine x 2)     Status: None (Preliminary result)   Collection Time: 01/31/21  6:08 AM   Specimen: BLOOD RIGHT HAND  Result Value Ref  Range Status   Specimen Description BLOOD RIGHT HAND  Final   Special Requests   Final    AEROBIC BOTTLE ONLY Blood Culture results may not be optimal due to an inadequate volume of blood received in culture bottles   Culture   Final    NO GROWTH 3 DAYS Performed at [facility], [place] [address] [place], [place] [address]    Report Status PENDING  Incomplete  Culture, blood (routine x 2)     Status: None (Preliminary result)   Collection Time: 01/31/21  3:06 PM   Specimen: BLOOD  Result Value Ref Range Status   Specimen Description BLOOD SITE NOT SPECIFIED  Final   Special Requests   Final    BOTTLES DRAWN AEROBIC ONLY Blood Culture results may not be optimal due to an inadequate volume of blood received in culture bottles   Culture   Final    NO GROWTH 3 DAYS Performed at [facility], [place] [address], [place], [place] [address]    Report Status PENDING  Incomplete     Terri Piedra,  NP [facility]  02/03/2021  12:49 PM

## 2021-02-03 NOTE — Progress Notes (Signed)
RT note. Patient currently on 4L  sat 95% with no labored breathing noted. Patient currently not requiring bipap at this time. RT will continue to monitor.

## 2021-02-03 NOTE — Progress Notes (Signed)
Physical Therapy Treatment Patient Details Name: Anthony Weber MRN: [id number] DOB: [birth year] Today's Date: 02/03/2021   History of Present Illness Pt is a 75 y.o. male admitted to [facility] on 01/18/21 with acute heart failure exacerbation; pt developed cardiogenic shock, acute respiratory failure, PNA with transfer to [facility] on 10/20. Course complicated by MSSA bacteremia, encephalopathy. PMH includes CHF, HTN, morbid obesity.   PT Comments    Pt progressing with mobility. Today's session focused on transfer and gait training with RW, pt requiring up to modA to stand; dependent for standing ADL task with nursing prior to session. Pt remains limited by generalized weakness, decreased activity tolerance, and impaired balance strategies/postural reactions. Continue to recommend SNF-level therapies to maximize functional mobility and independence prior to return home; pt's wife present and in agreement.  SpO2 88-94% on 4L O2 with activity BP 104/53 post-mobility    Recommendations for follow up therapy are one component of a multi-disciplinary discharge planning process, led by the attending physician.  Recommendations may be updated based on patient status, additional functional criteria and insurance authorization.  Follow Up Recommendations  Skilled nursing-short term rehab (<3 hours/day)     Assistance Recommended at Discharge Frequent or constant Supervision/Assistance  Equipment Recommendations  None recommended by PT    Recommendations for Other Services       Precautions / Restrictions Precautions Precautions: Fall Restrictions Weight Bearing Restrictions: No     Mobility  Bed Mobility               General bed mobility comments: Received sitting on BSC with nursing    Transfers Overall transfer level: Needs assistance Equipment used: Rolling walker (2 wheels) Transfers: Sit to/from Stand Sit to Stand: Min assist;Mod assist           General transfer  comment: Initial minA for trunk elevation standing from [facility] to RW, cues for hand placement; modA for trnk elevation standing from lower recliner height, heavy reliance on UE support to push into standing, repeated cues for hand placement    Ambulation/Gait Ambulation/Gait assistance: Min assist;+2 safety/equipment Gait Distance (Feet): 10 Feet (+ 30') Assistive device: Rolling walker (2 wheels) Gait Pattern/deviations: Step-through pattern;Decreased stride length;Trunk flexed Gait velocity: Decreased   General Gait Details: Slow, labored gait with RW and minA for stability; initial frequent standing rest breaks due to fatigue and SOB, cues for pursed lip breathing, cues for upright posture and sequencing with RW; 1x prolonged seated rest, then additional 30' with improved step length and pt self-correcting to improve upright posture; additional seated rest, further distance limited by fatigue   Stairs             Wheelchair Mobility    Modified Rankin (Stroke Patients Only)       Balance Overall balance assessment: Needs assistance Sitting-balance support: No upper extremity supported;Feet supported Sitting balance-Leahy Scale: Fair     Standing balance support: Bilateral upper extremity supported;During functional activity Standing balance-Leahy Scale: Poor Standing balance comment: Heavy reilance on UE support'                            Cognition Arousal/Alertness: Awake/alert Behavior During Therapy: Flat affect Overall Cognitive Status: Within Functional Limits for tasks assessed                                 General Comments: WFL for simple tasks; requires some  repetition, suspect HOH, but may also have some slowed processing        Exercises      General Comments General comments (skin integrity, edema, etc.): Pt's wife present and supportive; pt and wife planning for post-acute rehab at SNF ([facility]). SpO2 94% on 4L O2 [place];  post-mobility BP 104/53      Pertinent Vitals/Pain Pain Assessment: No/denies pain Pain Intervention(s): Monitored during session    Home Living                          Prior Function            PT Goals (current goals can now be found in the care plan section) Progress towards PT goals: Progressing toward goals    Frequency    Min 2X/week      PT Plan Frequency needs to be updated    Co-evaluation              AM-PAC PT "6 Clicks" Mobility   Outcome Measure  Help needed turning from your back to your side while in a flat bed without using bedrails?: A Lot Help needed moving from lying on your back to sitting on the side of a flat bed without using bedrails?: A Lot Help needed moving to and from a bed to a chair (including a wheelchair)?: A Lot Help needed standing up from a chair using your arms (e.g., wheelchair or bedside chair)?: A Lot Help needed to walk in hospital room?: A Little Help needed climbing 3-5 steps with a railing? : A Lot 6 Click Score: 13    End of Session Equipment Utilized During Treatment: Gait belt;Oxygen Activity Tolerance: Patient tolerated treatment well Patient left: in chair;with call bell/phone within reach;with family/visitor present;with nursing/sitter in room Nurse Communication: Mobility status PT Visit Diagnosis: Other abnormalities of gait and mobility (R26.89);Muscle weakness (generalized) (M62.81)     Time: 5465-0354 PT Time Calculation (min) (ACUTE ONLY): 27 min  Charges:  $Gait Training: 8-22 mins $Therapeutic Activity: 8-22 mins                     Mabeline Caras, PT, DPT Acute Rehabilitation Services  Pager [phone] Office [place] 02/03/2021, 1:59 PM

## 2021-02-04 DIAGNOSIS — I5043 Acute on chronic combined systolic (congestive) and diastolic (congestive) heart failure: Secondary | ICD-10-CM

## 2021-02-04 DIAGNOSIS — J9622 Acute and chronic respiratory failure with hypercapnia: Secondary | ICD-10-CM

## 2021-02-04 DIAGNOSIS — E871 Hypo-osmolality and hyponatremia: Secondary | ICD-10-CM

## 2021-02-04 DIAGNOSIS — J9621 Acute and chronic respiratory failure with hypoxia: Secondary | ICD-10-CM

## 2021-02-04 LAB — CULTURE, BLOOD (ROUTINE X 2)
Culture: NO GROWTH
Culture: NO GROWTH
Special Requests: ADEQUATE
Special Requests: ADEQUATE

## 2021-02-04 LAB — COMPREHENSIVE METABOLIC PANEL
ALT: 20 U/L (ref 0–44)
AST: 29 U/L (ref 15–41)
Albumin: 2.1 g/dL — ABNORMAL LOW (ref 3.5–5.0)
Alkaline Phosphatase: 84 U/L (ref 38–126)
Anion gap: 9 (ref 5–15)
BUN: 47 mg/dL — ABNORMAL HIGH (ref 8–23)
CO2: 33 mmol/L — ABNORMAL HIGH (ref 22–32)
Calcium: 8.2 mg/dL — ABNORMAL LOW (ref 8.9–10.3)
Chloride: 85 mmol/L — ABNORMAL LOW (ref 98–111)
Creatinine, Ser: 1.01 mg/dL (ref 0.61–1.24)
GFR, Estimated: >60 mL/min (ref >60)
Glucose, Bld: 100 mg/dL — ABNORMAL HIGH (ref 70–99)
Potassium: 4.4 mmol/L (ref 3.5–5.1)
Sodium: 127 mmol/L — ABNORMAL LOW (ref 135–145)
Total Bilirubin: 0.9 mg/dL (ref 0.3–1.2)
Total Protein: 5.7 g/dL — ABNORMAL LOW (ref 6.5–8.1)

## 2021-02-04 LAB — GLUCOSE, CAPILLARY
Glucose-Capillary: 103 mg/dL — ABNORMAL HIGH (ref 70–99)
Glucose-Capillary: 114 mg/dL — ABNORMAL HIGH (ref 70–99)
Glucose-Capillary: 135 mg/dL — ABNORMAL HIGH (ref 70–99)
Glucose-Capillary: 95 mg/dL (ref 70–99)

## 2021-02-04 LAB — COOXEMETRY PANEL
Carboxyhemoglobin: 0.9 % (ref 0.5–1.5)
Carboxyhemoglobin: 1.1 % (ref 0.5–1.5)
Methemoglobin: 0.8 % (ref 0.0–1.5)
Methemoglobin: 0.7 % (ref 0.0–1.5)
O2 Saturation: 80.7 %
O2 Saturation: 73.3 %
Total hemoglobin: 13.2 g/dL (ref 12.0–16.0)
Total hemoglobin: 12.7 g/dL (ref 12.0–16.0)

## 2021-02-04 MED ORDER — SODIUM CHLORIDE 0.9% FLUSH: 10.0000 mL | INTRAVENOUS | Status: DC | PRN

## 2021-02-04 MED ORDER — ACETAZOLAMIDE 250 MG PO TABS
250.0000 mg | ORAL_TABLET | Freq: Two times a day (BID) | ORAL | Status: AC
  Administered 2021-02-04 (×2): 250 mg via ORAL
  Filled 2021-02-04 (×2): qty 1

## 2021-02-04 MED ORDER — SODIUM CHLORIDE 0.9% FLUSH
10.0000 mL | Freq: Two times a day (BID) | INTRAVENOUS | Status: DC
  Administered 2021-02-04: 10 mL

## 2021-02-04 MED ORDER — FUROSEMIDE 10 MG/ML IJ SOLN
80.0000 mg | Freq: Once | INTRAMUSCULAR | Status: AC
  Administered 2021-02-04: 80 mg via INTRAVENOUS
  Filled 2021-02-04: qty 8

## 2021-02-04 MED ORDER — AMIODARONE HCL 200 MG PO TABS: 200.0000 mg | ORAL_TABLET | Freq: Two times a day (BID) | ORAL | Status: DC

## 2021-02-04 MED ORDER — MEXILETINE HCL 150 MG PO CAPS
150.0000 mg | ORAL_CAPSULE | Freq: Two times a day (BID) | ORAL | Status: DC
  Administered 2021-02-04 – 2021-02-11 (×15): 150 mg via ORAL
  Filled 2021-02-04 (×18): qty 1

## 2021-02-04 MED ORDER — AMIODARONE HCL 200 MG PO TABS
200.0000 mg | ORAL_TABLET | Freq: Two times a day (BID) | ORAL | Status: DC
  Administered 2021-02-04 – 2021-02-11 (×15): 200 mg via ORAL
  Filled 2021-02-04 (×15): qty 1

## 2021-02-04 MED ORDER — TORSEMIDE 20 MG PO TABS
20.0000 mg | ORAL_TABLET | Freq: Every day | ORAL | Status: DC
  Administered 2021-02-05 – 2021-02-06 (×2): 20 mg via ORAL
  Filled 2021-02-04 (×2): qty 1

## 2021-02-04 NOTE — Progress Notes (Signed)
[place] for Infectious Disease  Date of Admission:  01/29/2021     Total days of antibiotics 7         ASSESSMENT:  Mr. [facility] blood cultures from 01/31/21 have remained without growth and central line was removed earlier today. Will repeat blood cultures to ensure clearance of bacteremia following catheter removal. TEE scheduled for tomorrow. Suspect source control achieved with removal of central line. Will continue current dose of Cefazolin with duration of treatment pending TEE results. Will need PICC placement following line holiday for continued treatment with Cefazolin. Remaining medical and supportive care per CCM and Cardiology.  PLAN:  Continue current dose of Cefazolin. Repeat blood cultures.  Await TEE results for final antibiotic recommendations. Will need PICC following line holiday for home health antibiotic administration Remaining medical care per CCM and Cardiology.   Active Problems:   Cardiogenic shock (HCC)   MSSA bacteremia   Central line infection   Pneumonia of both lower lobes due to methicillin susceptible Staphylococcus aureus (MSSA) (HCC)   Hyponatremia   Acute on chronic combined systolic and diastolic congestive heart failure (HCC)   Acute on chronic respiratory failure with hypoxia and hypercapnia (HCC)    acetaZOLAMIDE  250 mg Oral BID   amiodarone  200 mg Oral BID   Chlorhexidine Gluconate Cloth  6 each Topical Daily   heparin  5,000 Units Subcutaneous Q8H   insulin aspart  0-15 Units Subcutaneous TID WC   levothyroxine  50 mcg Oral Q0600   mouth rinse  15 mL Mouth Rinse BID   mexiletine  150 mg Oral Q12H   midodrine  10 mg Oral TID WC   polyethylene glycol  17 g Oral BID   pravastatin  10 mg Oral q1800   senna-docusate  1 tablet Oral BID   sodium chloride flush  10-40 mL Intracatheter Q12H   [START ON 02/05/2021] torsemide  20 mg Oral Daily    SUBJECTIVE:  Afebrile overnight with no acute events. Central line removed this  morning. TEE planned for tomorrow. Overall feeling okay with no shortness of breath or fevers.   No Known Allergies   Review of Systems: Review of Systems  Constitutional:  Negative for chills, fever and weight loss.  Respiratory:  Negative for cough, shortness of breath and wheezing.   Cardiovascular:  Negative for chest pain and leg swelling.  Gastrointestinal:  Negative for abdominal pain, constipation, diarrhea, nausea and vomiting.  Skin:  Negative for rash.     OBJECTIVE: Vitals:   02/04/21 0800 02/04/21 0900 02/04/21 1000 02/04/21 1116  BP: (!) 114/56 (!) 109/47 108/67   Pulse: 71 70 (!) 44   Resp: (!) 25 18 18    Temp:    97.6 F (36.4 C)  TempSrc:    Oral  SpO2: 96% 95% 92%   Weight:       Body mass index is 43.27 kg/m.  Physical Exam Constitutional:      General: He is not in acute distress.    Appearance: He is well-developed. He is obese.     Comments: Lying in bed with head of bed elevated; pleasant.   Cardiovascular:     Rate and Rhythm: Normal rate and regular rhythm.     Heart sounds: Normal heart sounds.  Pulmonary:     Effort: Pulmonary effort is normal.     Breath sounds: Normal breath sounds.  Skin:    General: Skin is warm and dry.  Neurological:  Mental Status: He is alert and oriented to person, place, and time.  Psychiatric:        Mood and Affect: Mood normal.    Lab Results Lab Results  Component Value Date   WBC 13.5 (H) 02/03/2021   HGB 13.0 02/03/2021   HCT 38.5 (L) 02/03/2021   MCV 85.0 02/03/2021   PLT 209 02/03/2021    Lab Results  Component Value Date   CREATININE 1.01 02/04/2021   BUN 47 (H) 02/04/2021   NA 127 (L) 02/04/2021   K 4.4 02/04/2021   CL 85 (L) 02/04/2021   CO2 33 (H) 02/04/2021    Lab Results  Component Value Date   ALT 20 02/04/2021   AST 29 02/04/2021   ALKPHOS 84 02/04/2021   BILITOT 0.9 02/04/2021     Microbiology: Recent Results (from the past 240 hour(s))  CULTURE, BLOOD (ROUTINE X 2) w  Reflex to ID Panel     Status: Abnormal   Collection Time: 01/29/21  2:00 PM   Specimen: BLOOD  Result Value Ref Range Status   Specimen Description   Final    BLOOD LEFT ANTECUBITAL Performed at [facility], [address] [place], [place] [address]    Special Requests   Final    BOTTLES DRAWN AEROBIC AND ANAEROBIC Blood Culture adequate volume Performed at [facility], [address] [place], [place] [address]    Culture  Setup Time   Final    IN BOTH AEROBIC AND ANAEROBIC BOTTLES GRAM POSITIVE COCCI CRITICAL RESULT CALLED TO, READ BACK BY AND VERIFIED WITH: GREGG ABBOTT @0345  ON 01/30/21 SKL Performed at [facility], [place] [address], [place], [place] [address]    Culture STAPHYLOCOCCUS AUREUS (A)  Final   Report Status 02/01/2021 FINAL  Final   Organism ID, Bacteria STAPHYLOCOCCUS AUREUS  Final      Susceptibility   Staphylococcus aureus - MIC*    CIPROFLOXACIN <=0.5 SENSITIVE Sensitive     ERYTHROMYCIN >=8 RESISTANT Resistant     GENTAMICIN <=0.5 SENSITIVE Sensitive     OXACILLIN <=0.25 SENSITIVE Sensitive     TETRACYCLINE <=1 SENSITIVE Sensitive     VANCOMYCIN <=0.5 SENSITIVE Sensitive     TRIMETH/SULFA <=10 SENSITIVE Sensitive     CLINDAMYCIN RESISTANT Resistant     RIFAMPIN <=0.5 SENSITIVE Sensitive     Inducible Clindamycin POSITIVE Resistant     * STAPHYLOCOCCUS AUREUS  Blood Culture ID Panel (Reflexed)     Status: Abnormal   Collection Time: 01/29/21  2:00 PM  Result Value Ref Range Status   Enterococcus faecalis NOT DETECTED NOT DETECTED Final   Enterococcus Faecium NOT DETECTED NOT DETECTED Final   Listeria monocytogenes NOT DETECTED NOT DETECTED Final   Staphylococcus species DETECTED (A) NOT DETECTED Final    Comment: CRITICAL RESULT CALLED TO, READ BACK BY AND VERIFIED WITH: GREGG ABBOTT @0345  ON 01/30/21 SKL    Staphylococcus aureus (BCID) DETECTED (A) NOT DETECTED Final    Comment: CRITICAL RESULT CALLED TO, READ BACK BY AND  VERIFIED WITH: GREGG ABBOTT @0345  ON 01/30/21 SKL    Staphylococcus epidermidis NOT DETECTED NOT DETECTED Final   Staphylococcus lugdunensis NOT DETECTED NOT DETECTED Final   Streptococcus species NOT DETECTED NOT DETECTED Final   Streptococcus agalactiae NOT DETECTED NOT DETECTED Final   Streptococcus pneumoniae NOT DETECTED NOT DETECTED Final   Streptococcus pyogenes NOT DETECTED NOT DETECTED Final   A.calcoaceticus-baumannii NOT DETECTED NOT DETECTED Final   Bacteroides fragilis NOT DETECTED NOT DETECTED  Final   Enterobacterales NOT DETECTED NOT DETECTED Final   Enterobacter cloacae complex NOT DETECTED NOT DETECTED Final   Escherichia coli NOT DETECTED NOT DETECTED Final   Klebsiella aerogenes NOT DETECTED NOT DETECTED Final   Klebsiella oxytoca NOT DETECTED NOT DETECTED Final   Klebsiella pneumoniae NOT DETECTED NOT DETECTED Final   Proteus species NOT DETECTED NOT DETECTED Final   Salmonella species NOT DETECTED NOT DETECTED Final   Serratia marcescens NOT DETECTED NOT DETECTED Final   Haemophilus influenzae NOT DETECTED NOT DETECTED Final   Neisseria meningitidis NOT DETECTED NOT DETECTED Final   Pseudomonas aeruginosa NOT DETECTED NOT DETECTED Final   Stenotrophomonas maltophilia NOT DETECTED NOT DETECTED Final   Candida albicans NOT DETECTED NOT DETECTED Final   Candida auris NOT DETECTED NOT DETECTED Final   Candida glabrata NOT DETECTED NOT DETECTED Final   Candida krusei NOT DETECTED NOT DETECTED Final   Candida parapsilosis NOT DETECTED NOT DETECTED Final   Candida tropicalis NOT DETECTED NOT DETECTED Final   Cryptococcus neoformans/gattii NOT DETECTED NOT DETECTED Final   Meth resistant mecA/C and MREJ NOT DETECTED NOT DETECTED Final    Comment: Performed at [facility] [address] [place], [place] [address]  CULTURE, BLOOD (ROUTINE X 2) w Reflex to ID Panel     Status: Abnormal   Collection Time: 01/29/21  2:03 PM   Specimen: BLOOD  Result Value Ref  Range Status   Specimen Description   Final    BLOOD BLOOD LEFT HAND Performed at [facility], [address] [place], [place] [address]    Special Requests   Final    BOTTLES DRAWN AEROBIC AND ANAEROBIC Blood Culture adequate volume Performed at [facility], [address] [place], [place] [address]    Culture  Setup Time   Final    IN BOTH AEROBIC AND ANAEROBIC BOTTLES GRAM POSITIVE COCCI CRITICAL VALUE NOTED.  VALUE IS CONSISTENT WITH PREVIOUSLY REPORTED AND CALLED VALUE. Performed at [facility], [place] [address] [place], [place] [address]    Culture (A)  Final    STAPHYLOCOCCUS AUREUS SUSCEPTIBILITIES PERFORMED ON PREVIOUS CULTURE WITHIN THE LAST 5 DAYS. Performed at [facility], [place] [address], [place], [place] [address]    Report Status 02/01/2021 FINAL  Final  Urine Culture     Status: None   Collection Time: 01/29/21  8:38 PM   Specimen: Urine, Catheterized  Result Value Ref Range Status   Specimen Description URINE, CATHETERIZED  Final   Special Requests NONE  Final   Culture   Final    NO GROWTH Performed at [facility], [place] [address] [place], [place] [address]    Report Status 01/31/2021 FINAL  Final  Expectorated Sputum Assessment w Gram Stain, Rflx to Resp Cult     Status: None   Collection Time: 01/29/21  8:38 PM   Specimen: Sputum  Result Value Ref Range Status   Specimen Description SPUTUM  Final   Special Requests NONE  Final   Sputum evaluation   Final    Sputum specimen not acceptable for testing.  Please recollect.   RESULT CALLED TO, READ BACK BY AND VERIFIED WITH: RN KOBE 01/30/21@00 :36 Performed at [facility], [place] [address] [place], [place] [address]    Report Status 01/31/2021 FINAL  Final  Expectorated Sputum Assessment w Gram Stain, Rflx to Resp Cult     Status: None   Collection Time: 01/30/21  9:04 AM  Specimen: Sputum  Result Value Ref Range Status   Specimen Description SPUTUM   Final   Special Requests  EXPECTORATED  Final   Sputum evaluation   Final    THIS SPECIMEN IS ACCEPTABLE FOR SPUTUM CULTURE Performed at [facility], [place] [address], [place], [place] [address]    Report Status 01/30/2021 FINAL  Final  Culture, Respiratory w Gram Stain     Status: None   Collection Time: 01/30/21  9:04 AM   Specimen: SPU  Result Value Ref Range Status   Specimen Description SPUTUM  Final   Special Requests  EXPECTORATED Reflexed from [id number]  Final   Gram Stain   Final    FEW SQUAMOUS EPITHELIAL CELLS PRESENT FEW WBC SEEN FEW GRAM POSITIVE COCCI    Culture   Final    FEW Consistent with normal respiratory flora. No Pseudomonas species isolated Performed at [place] [address] [place], [place] [address]    Report Status 02/02/2021 FINAL  Final  Culture, blood (routine x 2)     Status: None   Collection Time: 01/30/21 10:08 AM   Specimen: BLOOD RIGHT HAND  Result Value Ref Range Status   Specimen Description BLOOD RIGHT HAND  Final   Special Requests   Final    BOTTLES DRAWN AEROBIC AND ANAEROBIC Blood Culture adequate volume   Culture   Final    NO GROWTH 5 DAYS Performed at [facility], [place] [address] [place] [address], [place], [place] [address]    Report Status 02/04/2021 FINAL  Final  Culture, blood (routine x 2)     Status: None   Collection Time: 01/30/21 10:09 AM   Specimen: BLOOD LEFT HAND  Result Value Ref Range Status   Specimen Description BLOOD LEFT HAND  Final   Special Requests   Final    BOTTLES DRAWN AEROBIC AND ANAEROBIC Blood Culture adequate volume   Culture   Final    NO GROWTH 5 DAYS Performed at [facility], [place] [address] [place], [place] [address]    Report Status 02/04/2021 FINAL  Final  MRSA Next Gen by PCR, Nasal     Status: None   Collection Time: 01/30/21 11:01 AM   Specimen: Nasal Mucosa; Nasal Swab  Result Value Ref Range Status   MRSA by PCR Next Gen NOT DETECTED NOT DETECTED Final    Comment:  (NOTE) The GeneXpert MRSA Assay (FDA approved for NASAL specimens only), is one component of a comprehensive MRSA colonization surveillance program. It is not intended to diagnose MRSA infection nor to guide or monitor treatment for MRSA infections. Test performance is not FDA approved in patients less than 51 years old. Performed at [facility], [place] [address] [place], [place] [address]   Culture, blood (routine x 2)     Status: None (Preliminary result)   Collection Time: 01/31/21  6:08 AM   Specimen: BLOOD RIGHT HAND  Result Value Ref Range Status   Specimen Description BLOOD RIGHT HAND  Final   Special Requests   Final    AEROBIC BOTTLE ONLY Blood Culture results may not be optimal due to an inadequate volume of blood received in culture bottles   Culture   Final    NO GROWTH 4 DAYS Performed at [facility], [place] [address] [place], [place] [address]    Report Status PENDING  Incomplete  Culture, blood (routine x 2)     Status: None (Preliminary result)  Collection Time: 01/31/21  3:06 PM   Specimen: BLOOD  Result Value Ref Range Status   Specimen Description BLOOD SITE NOT SPECIFIED  Final   Special Requests   Final    BOTTLES DRAWN AEROBIC ONLY Blood Culture results may not be optimal due to an inadequate volume of blood received in culture bottles   Culture   Final    NO GROWTH 4 DAYS Performed at [facility], [place] [address] [place], [place] [address]    Report Status PENDING  Incomplete     Terri Piedra, [place] [facility] [place] [facility]  02/04/2021  12:30 PM

## 2021-02-04 NOTE — Progress Notes (Signed)
Transition of Care [facility]) - Initial/Assessment Note    Patient Details  Name: Anthony Weber MRN: [id number] Date of Birth: 02-22-1946  Transition of Care [facility]) CM/SW Contact:    Geraldine, [place] Phone Number: 02/04/2021, 12:43 PM  Clinical Narrative:                 HF CSW received consult for possible SNF placement at time of discharge. CSW spoke with patient and his wife, Hassan Rowan over the phone on speaker in the patient's room. Patient reported that patient's spouse is currently unable to care for patient at their home given patient's current physical needs and fall risk. Patient expressed understanding of PT recommendation and is agreeable to SNF placement at time of discharge. Patient reports preference for [facility] as their daughter works at the facility and reportedly have a bed already for him. CSW discussed insurance authorization process and provided Medicare SNF ratings list. Patient has received the COVID vaccines. Patient expressed being hopeful for rehab and to feel better soon. No further questions reported at this time.   Skilled Nursing Rehab Facilities-   [email or web address] Ratings out of 5 possible    Name Address  Phone # [place] Inspection Overall  [facility] [address], [place] 5 1 4 4   [facility]  [address], [place] [phone] 3 1 5 4   [facility] [place], [place] 3 1 1 1   [place], [place] 2 2 4 4   [facility] [address] [place] 3 1 2 1   [place] 3 2 4 4   [facility] [address], [place] 5 1 2 2   [facility] [address] [place] 5 2 2 3   [facility] [place], [place] [phone] 5 1 2 2   [facility] [address], [place] [phone] 5 1 2 2   [facility] [address], [facility] [phone] 5 1 2 2   [place] [address] [place] [phone] 3 1 1 1      Expected Discharge Plan: [place] Barriers to Discharge: Continued Medical Work up   Patient Goals and CMS Choice Patient states their goals for this hospitalization and ongoing recovery are:: to get better for return home CMS Medicare.gov Compare Post Acute Care list provided to:: Patient Choice offered to / list presented to : Patient, Spouse  Expected Discharge Plan and Services Expected Discharge Plan: [place] In-house Referral: Clinical Social Work Discharge Planning Services: CM Consult   Living arrangements for the past 2 months: [place]                                      Prior Living Arrangements/Services Living arrangements for the past 2 months: Single Family Home Lives with:: Self, Spouse Patient language and need for interpreter reviewed:: Yes Do you feel safe going back to the place where you live?: Yes      Need for Family Participation in Patient Care: No (Comment) Care giver support system in place?: No (comment)   Criminal Activity/Legal Involvement Pertinent to Current Situation/Hospitalization: No - Comment as needed  Activities of Daily Living      Permission Sought/Granted Permission sought to share information with : Family Supports, Case  Freight forwarder, Chartered certified accountant granted to share information with : Yes, Verbal Permission Granted  Share Information with NAME: Hassan Rowan  Permission granted to share info w AGENCY: SNF's  Permission granted to share info w Relationship: Wife  Permission granted to share info w Contact Information: [phone]  Emotional Assessment Appearance:: Appears stated age Attitude/Demeanor/Rapport: Engaged Affect (typically observed): Pleasant Orientation:  : Oriented to Self, Oriented to Place, Oriented to  Time, Oriented to Situation   Psych Involvement: No (comment)  Admission diagnosis:  Cardiogenic shock [R57.0] Cardiogenic shock ([place]) [R57.0] Patient Active Problem List   Diagnosis Date Noted   Hyponatremia    Acute on chronic combined systolic and diastolic congestive heart failure (HCC)    Acute on chronic respiratory failure with hypoxia and hypercapnia ([place])    MSSA bacteremia 02/03/2021   Central line infection    Pneumonia of both lower lobes due to methicillin susceptible Staphylococcus aureus (MSSA) ([place])    Cardiogenic shock ([place]) 01/29/2021   Primary hypertension    Acute HFrEF (heart failure with reduced ejection fraction) ([place])    Acute CHF (congestive heart failure) ([place]) 01/18/2021   PCP:  Albina Billet, MD Pharmacy:   [id number] Closed - [place],  - [address] [place] [address] Phone: [phone] Fax: [phone]  [id number] [place], [place] [address] [place] [address] Phone: [phone] Fax: [phone]     Social Determinants of Health (SDOH) Interventions    Readmission Risk Interventions No flowsheet data found.  Ransome Helwig, MSW, [place] Heart Failure Social Worker

## 2021-02-04 NOTE — Progress Notes (Signed)
OT Cancellation Note  Patient Details Name: Abrian Hanover MRN: [id number] DOB: 1945-08-21   Cancelled Treatment:    Reason Eval/Treat Not Completed: Fatigue/lethargy limiting ability to participate (Pt just returned to bed after walk.)  [name] 02/04/2021, 3:36 PM Nestor Lewandowsky, OTR/L Acute Rehabilitation Services Pager: [phone] Office: [phone]

## 2021-02-04 NOTE — Progress Notes (Signed)
NAME:  Anthony Weber, MRN:  [id number], DOB:  10/19/1945, LOS: 6 ADMISSION DATE:  01/29/2021, CONSULTATION DATE:  01/29/21 REFERRING MD:  Mortimer Fries -[facility] CCM, CHIEF COMPLAINT:  Cardiogenic shock   History of Present Illness:  75 yo M PMH morbid obesity, HTN, HLD, hypothyroidism, HFrEF who presented to [facility] ED 10/9 with CC BLE swelling. He reportedly gained 15lb over 1wk prior to presentation, had incr DOE. He was admitted to hospitalist service for acute heart failure exacerbation. Cardiology was consulted 10/10 and recommended diuresis, lisonopril and repeat ECHO which revealed LVEF 25-30% with indeterminate LV diastolic parameters and poorly visualized RV systolic function.  He received ongoing medication optimization and went for [facility] 10/18 which showed no significant CAD, moderately elevated L heart and PA pressure, severely elevated R Heart pressures. He was transferred to the ICU 10/18 and started on a milrinone infusion for cardiogenic shock.   On 10/20 the pt was started on a lasix gtt for hypoxia in setting of cardiogenic shock and a plan was made to transfer the patient to [place] for further management and care.   Pertinent  Medical History  HTN HFrEF HLD Morbid obesity Hypothyroidism   Significant Hospital Events: Including procedures, antibiotic start and stop dates in addition to other pertinent events   10/9 admitted to [facility] with likely HF exacerbation. Diuresed  10/10 cards consulted for HF recs  10/18 R/LHC without significant CAD, but had incr L heart, PA, and R heart pressures. Transferred to ICU, started on milronone> developed hypotension and NSVT> milrinone reduced and started norepi and amiodarone.  10/20 Transferred to [facility] 10/21 MSSA in blood cultures, A-line placed and removed overnight 10/24 pressors weaned off.   Interim History / Subjective:  No complaints. Finished breakfast, now watching TV. On 4L O2 via [place].  Objective   Blood pressure 90/67,  pulse 70, temperature 97.7 F (36.5 C), temperature source Oral, resp. rate (!) 21, weight (!) 136.8 kg, SpO2 96 %. CVP:  [4 mmHg-9 mmHg] 5 mmHg      Intake/Output Summary (Last 24 hours) at 02/04/2021 0748 Last data filed at 02/04/2021 0700 Gross per 24 hour  Intake 1808.32 ml  Output 3065 ml  Net -1256.68 ml    Filed Weights   02/02/21 0500 02/03/21 0500 02/04/21 0500  Weight: (!) 138.6 kg (!) 137 kg (!) 136.8 kg   General: obese man, sitting up in bed watching TV, in no distress.  HEENT: [place]/AT, eyes anicteric Cardio: IRIR, no M/R/G Resp: CTAB Abd: obese, soft, NT Extremities: No deformities. 1+ edema. Derm: Warm and dry. Neuro: Awake follows commands. No focal deficits.    Assessment & Plan:   Critically ill due to acute respiratory failure with hypoxia and hypercapnea due to pneumonia and decompensated biventricular  failure requiring noninvasive ventilation Probable OSA/OHS Bacterial pneumonia suspect MSSA MSSA bacteremia due to above Mixed shock - cardiogenic and septic 2/2 above Acute on chronic HFrEF (EF down from 55 to 20-25% with no CAD) NSVT, frequent PVCs requiring milrinone Contraction alkalosis - improving after change from lasix infusion to scheduled with slower rate of diuresis as goal Hypervolemic Hyponatremia  Plan:  -Continue nocturnal BiPAP and daytime BiPAP as needed. -Needs outpatient PSG after discharge. -Wean FiO2 as tolerated.   -Continue Ancef for MSSA bacteremia.  ID is following. -TEE planned for 10/27to evaluate for endocarditis. -Needs CVL holiday, will place d/c order. -Continue milrinone, midodrine. -Holding GDHFT due to hypotension. Would need to come off both midodrine and milrinone prior  to re-initiating. -Given signs of intravascular volume contraction, continue slower rate of net diuresis and allow more gradual clearance of remaining peripheral edema (versus lasix drip as before). Switched to furosemide 40 bid and acetazolamide  10/25 with goal to remove 1.5L/d. -Continue amiodarone IV. Consider introducing beta-blocker.  -Diuresis as above. -Free water restriction.  Best Practice (right click and "Reselect all SmartList Selections" daily)   Diet/type: Regular consistency (see orders) DVT prophylaxis: prophylactic heparin  GI prophylaxis: N/A Lines: Central line - needs CVL holiday, will place dc order Foley:  Yes, and it is still needed Code Status:  full code Last date of multidisciplinary goals of care discussion [10/20, met with palliative care]  CC time: 30 min.   Montey Hora, [place] [facility] For pager details, please see AMION or use Epic chat  After 1900, please call [place] for cross coverage needs 02/04/2021, 8:03 AM

## 2021-02-04 NOTE — NC FL2 (Signed)
[place] MEDICAID FL2 LEVEL OF CARE SCREENING TOOL     IDENTIFICATION  Patient Name: Anthony Weber Birthdate: 08-29-1945 Sex: male Admission Date (Current Location): 01/29/2021  [facility] [place] Number:  Herbalist and Address:  The . [facility], [place] [address], [place], [place] [address]      Provider Number: [id number]  Attending Physician Name and Address:  Kipp Brood, MD  Relative Name and Phone Number:       Current Level of Care: Hospital Recommended Level of Care: [place] Prior Approval Number:    Date Approved/Denied:   PASRR Number: [id number]  Discharge Plan: SNF    Current Diagnoses: Patient Active Problem List   Diagnosis Date Noted   Hyponatremia    Acute on chronic combined systolic and diastolic congestive heart failure (HCC)    Acute on chronic respiratory failure with hypoxia and hypercapnia ([place])    MSSA bacteremia 02/03/2021   Central line infection    Pneumonia of both lower lobes due to methicillin susceptible Staphylococcus aureus (MSSA) ([place])    Cardiogenic shock ([place]) 01/29/2021   Primary hypertension    Acute HFrEF (heart failure with reduced ejection fraction) ([place])    Acute CHF (congestive heart failure) ([place]) 01/18/2021    Orientation RESPIRATION BLADDER Height & Weight     Self, Time, Situation, Place  O2 (3L) Continent, Indwelling catheter Weight: (!) 301 lb 9.4 oz (136.8 kg) Height:     BEHAVIORAL SYMPTOMS/MOOD NEUROLOGICAL BOWEL NUTRITION STATUS      Continent Diet (See D/C Summary)  AMBULATORY STATUS COMMUNICATION OF NEEDS Skin   Limited Assist Verbally Normal                       Personal Care Assistance Level of Assistance  Bathing, Feeding, Dressing Bathing Assistance: Limited assistance Feeding assistance: Limited assistance Dressing Assistance: Limited assistance     Functional Limitations Info  Sight, Hearing, Speech Sight Info: Impaired Hearing  Info: Impaired Speech Info: Adequate    SPECIAL CARE FACTORS FREQUENCY  PT (By licensed PT), OT (By licensed OT)     PT Frequency: 5x/week OT Frequency: 5x/week            Contractures Contractures Info: Not present    Additional Factors Info  Code Status, Allergies, Insulin Sliding Scale Code Status Info: Full Allergies Info: No Known Allergies   Insulin Sliding Scale Info: See Med List       Current Medications (02/04/2021):  This is the current hospital active medication list Current Facility-Administered Medications  Medication Dose Route Frequency Provider Last Rate Last Admin   acetaminophen (TYLENOL) tablet 650 mg  650 mg Oral Q4H PRN Candee Furbish, MD       acetaZOLAMIDE (DIAMOX) tablet 250 mg  250 mg Oral BID Clegg, Amy D, NP   250 mg at 02/04/21 1001   amiodarone (PACERONE) tablet 200 mg  200 mg Oral BID Kipp Brood, MD   200 mg at 02/04/21 1001   ceFAZolin (ANCEF) IVPB 2g/100 mL premix  2 g Intravenous Q8H Tommy Medal, Lavell Islam, MD   Stopped at 02/04/21 9030   Chlorhexidine Gluconate Cloth 2 % PADS 6 each  6 each Topical Daily Candee Furbish, MD   6 each at 02/04/21 1002   docusate sodium (COLACE) capsule 100 mg  100 mg Oral BID PRN Candee Furbish, MD       heparin injection 5,000 Units  5,000 Units Subcutaneous  Q8H Candee Furbish, MD   5,000 Units at 02/04/21 0534   insulin aspart (novoLOG) injection 0-15 Units  0-15 Units Subcutaneous TID WC Julian Hy, DO   2 Units at 02/03/21 3953   levothyroxine (SYNTHROID) tablet 50 mcg  50 mcg Oral Q0600 Ninfa Meeker, Amy D, NP   50 mcg at 02/04/21 0534   MEDLINE mouth rinse  15 mL Mouth Rinse BID Noemi Chapel P, DO   15 mL at 02/04/21 1004   mexiletine (MEXITIL) capsule 150 mg  150 mg Oral Q12H Bensimhon, Shaune Pascal, MD   150 mg at 02/04/21 1154   midodrine (PROAMATINE) tablet 10 mg  10 mg Oral TID WC Clegg, Amy D, NP   10 mg at 02/04/21 1153   ondansetron (ZOFRAN) injection 4 mg  4 mg Intravenous Q6H PRN Candee Furbish,  MD       polyethylene glycol (MIRALAX / GLYCOLAX) packet 17 g  17 g Oral Daily PRN Candee Furbish, MD       polyethylene glycol (MIRALAX / GLYCOLAX) packet 17 g  17 g Oral BID Noemi Chapel P, DO   17 g at 02/04/21 1002   pravastatin (PRAVACHOL) tablet 10 mg  10 mg Oral q1800 Clegg, Amy D, NP   10 mg at 02/03/21 1702   senna-docusate (Senokot-S) tablet 1 tablet  1 tablet Oral BID Noemi Chapel P, DO   1 tablet at 02/04/21 1001   sodium chloride flush (NS) 0.9 % injection 10-40 mL  10-40 mL Intracatheter Q12H Agarwala, Einar Grad, MD   10 mL at 02/04/21 1003   sodium chloride flush (NS) 0.9 % injection 10-40 mL  10-40 mL Intracatheter PRN Kipp Brood, MD       [START ON 02/05/2021] torsemide (DEMADEX) tablet 20 mg  20 mg Oral Daily Kipp Brood, MD         Discharge Medications: Please see discharge summary for a list of discharge medications.  Relevant Imaging Results:  Relevant Lab Results:   Additional Information SSN#: [id number] PFIZER Comrnaty(Gray TOP) Covid-19 Vaccine 06/06/2019 , 05/17/2019  Timber Marshman, LCSWA

## 2021-02-04 NOTE — Progress Notes (Signed)
Spoke with patient about BIPAP.  We discussed he hasn't worn it in several days.  Patient states he was told it helped his heart and would like to wear it.  I advised pt I will get him set up.

## 2021-02-04 NOTE — Progress Notes (Addendum)
Advanced Heart Failure Rounding Note   Subjective:    Admit weight 325 pounds --> 301 pounds.   Diuresing with IV lasix + diamox.   Remains midodrine + milrinone 0.125 mcg. CO-OX 80%   On cefazolin for MSSA bacteremia.   Denies SOB.   Objective:   Weight Range:  Vital Signs:   Temp:  [97.4 F (36.3 C)-97.8 F (36.6 C)] 97.7 F (36.5 C) (10/26 0314) Pulse Rate:  [34-80] 70 (10/26 0700) Resp:  [12-38] 21 (10/26 0700) BP: (89-117)/(38-89) 90/67 (10/26 0700) SpO2:  [92 %-99 %] 96 % (10/26 0700) Weight:  [136.8 kg] 136.8 kg (10/26 0500) Last BM Date: 02/03/21  Weight change: Filed Weights   02/02/21 0500 02/03/21 0500 02/04/21 0500  Weight: (!) 138.6 kg (!) 137 kg (!) 136.8 kg    Intake/Output:   Intake/Output Summary (Last 24 hours) at 02/04/2021 0831 Last data filed at 02/04/2021 0700 Gross per 24 hour  Intake 1634.36 ml  Output 3065 ml  Net -1430.64 ml    CVP 10 personally checked.  Physical Exam: General:  . No resp difficulty HEENT: normal Neck: supple. JVP 9-10 Carotids 2+ bilat; no bruits. No lymphadenopathy or thryomegaly appreciated. Cor: PMI nondisplaced. Regular rate & rhythm. No rubs, gallops or murmurs. Lungs: clear on 4 liters .  Abdomen: obese, soft, nontender, nondistended. No hepatosplenomegaly. No bruits or masses. Good bowel sounds. Extremities: no cyanosis, clubbing, rash, R and LLE  1+edema with ted hose.  Neuro: alert & orientedx3, cranial nerves grossly intact. moves all 4 extremities w/o difficulty. Affect pleasant   Telemetry: SR with frequent PVCs 80s   Labs: Basic Metabolic Panel: Recent Labs  Lab 01/30/21 0526 01/30/21 1051 01/30/21 1715 01/31/21 0500 01/31/21 1745 02/01/21 0515 02/02/21 0510 02/03/21 0517 02/04/21 0435  NA 122*   < > 122* 124* 126*  126* 129* 130* 127* 127*  K 4.0   < > 4.2 3.8 3.8  3.9 4.1 4.5 3.7 4.4  CL 80*  --  80* 81* 81* 82* 83* 82* 85*  CO2 32  --  29 33* 36* 37* 39* 37* 33*  GLUCOSE  149*  --  139* 119* 162* 123* 114* 127* 100*  BUN 85*  --  82* 76* 76* 71* 63* 54* 47*  CREATININE 2.08*  --  1.79* 1.65* 1.43* 1.19 1.06 1.06 1.01  CALCIUM 8.0*  --  8.1* 7.9* 8.2* 8.5* 8.4* 8.3* 8.2*  MG 2.4  --  2.4 2.2  --  2.1 2.2 2.0  --   PHOS 4.6  --   --  3.2  --  2.6 2.8 3.2  --    < > = values in this interval not displayed.    Liver Function Tests: Recent Labs  Lab 01/31/21 0500 02/01/21 0515 02/02/21 0510 02/03/21 0517 02/04/21 0435  AST 26 24 29 28 29   ALT 28 27 26 25 20   ALKPHOS 73 75 71 83 84  BILITOT 1.0 1.4* 1.0 1.0 0.9  PROT 5.9* 5.8* 5.8* 5.7* 5.7*  ALBUMIN 2.3* 2.3* 2.2* 2.1* 2.1*   No results for input(s): LIPASE, AMYLASE in the last 168 hours. Recent Labs  Lab 01/31/21 1106  AMMONIA 19    CBC: Recent Labs  Lab 01/29/21 0602 01/30/21 0526 01/30/21 1051 01/31/21 0500 01/31/21 1745 02/01/21 0515 02/02/21 0510 02/03/21 0517  WBC 10.0 12.2*  --  13.7*  --  12.5* 11.5* 13.5*  NEUTROABS 8.7*  --   --   --   --   --   --   --  HGB 12.1* 12.4*   < > 13.1 14.3 13.1 13.4 13.0  HCT 35.1* 36.9*   < > 38.6* 42.0 39.3 39.5 38.5*  MCV 86.0 87.0  --  84.6  --  85.1 85.7 85.0  PLT 104* 106*  --  132*  --  153 177 209   < > = values in this interval not displayed.    Cardiac Enzymes: No results for input(s): CKTOTAL, CKMB, CKMBINDEX, TROPONINI in the last 168 hours.  BNP: BNP (last 3 results) Recent Labs    01/19/21 1237 01/27/21 0447 01/29/21 2020  BNP 942.4* 681.0* 333.2*    ProBNP (last 3 results) No results for input(s): PROBNP in the last 8760 hours.    Other results:  Imaging: DG CHEST PORT 1 VIEW  Result Date: 02/03/2021 CLINICAL DATA:  Central line placement EXAM: PORTABLE CHEST 1 VIEW COMPARISON:  01/29/2021 FINDINGS: Right central line in place with the tip in the SVC. No pneumothorax. Cardiomegaly with vascular congestion. Left lower lobe consolidation with probable left effusion. No confluent opacity on the right. IMPRESSION:  Right central line tip in the SVC.  No pneumothorax. Cardiomegaly, vascular congestion. Left lower lobe airspace opacity with left effusion. Cannot exclude pneumonia. Electronically Signed   By: Rolm Baptise M.D.   On: 02/03/2021 19:06     Medications:     Scheduled Medications:  acetaZOLAMIDE  250 mg Oral BID   Chlorhexidine Gluconate Cloth  6 each Topical Daily   furosemide  40 mg Intravenous BID   heparin  5,000 Units Subcutaneous Q8H   insulin aspart  0-15 Units Subcutaneous TID WC   levothyroxine  50 mcg Oral Q0600   mouth rinse  15 mL Mouth Rinse BID   midodrine  10 mg Oral TID WC   polyethylene glycol  17 g Oral BID   pravastatin  10 mg Oral q1800   senna-docusate  1 tablet Oral BID   sodium chloride flush  10-40 mL Intracatheter Q12H    Infusions:  amiodarone 30 mg/hr (02/04/21 0600)    ceFAZolin (ANCEF) IV 200 mL/hr at 02/04/21 0600   milrinone 0.125 mcg/kg/min (02/04/21 0600)    PRN Medications: acetaminophen, docusate sodium, ondansetron (ZOFRAN) IV, polyethylene glycol, sodium chloride flush   Assessment/Plan:   1. Shock - Cardiogenic/Septic  - Initial presentation consistent with cardiogenic shock but now with septic overlay with PNA and MSSA bacteremia - Procalcitonin 0.8  Lactic Acid 1.2 . RHC cardiac index 1.5  - BCx 2/2 MSSA. Urine Cx negative - Now on Ancef.   -Remains on milrinone and midodrine. Co-ox ok.    2. Acute Systolic Heart Failure  - Echo EF now down from 55-->20-25%. Cath with no CAD, elevated filling pressures and low output heart failure.  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  - Continues with high PVC burden despite IV amio. Susepct PVC induced cardiomyopathy. Continue amio to suppress burden - Co-ox 80% . Repeat CO-OX. If ok will stop milrinone.  - Given one more dose of IV lasix. Diuresing well. Weight down 24 pounds.  - Completing day 3 of diamox.  --  No room for GDMT with hypotension. Remains on midodrine 10 mg TID   3. Acute Hypoxic/Hypercarbia Respiratory Failure  - Improved with volume removal. Continue to wean oxygen as needed.  - Sats stable on 5 liters [place]. Continue wean as able.    4. NSVT, PVC  -High PVC burden On amio  drip. PVCs burden reduced. Continue amio. May need mexilitene  Keep K> 4.0 Mg>2.0 - outpatient sleep study  5. MSSA bacteremia - likely respiratory source - now on ancef. ID following. Will need line holiday before placing Home IV access - TEE scheduled Thursday at 1130 based on endo availability   6. AKI  - Creatinine peaked at 2.1 , now normalized    7. Hyponatremia - Sodium 127  - Restrict free water  8. Morbid Obesity   Body mass index is 43.27 kg/m.  9.  AMS - ABC with CO2 retention   10. Suspected Sleep Apnea -will need outpatient sleep study  11. Hypothyroidism  Check TSH Start home dose of levothyroixine.    12. Hypokalemia - supp  Disposition- Anticipate SNF.    Length of Stay: [place] NP-C  02/04/2021, 8:31 AM  Advanced Heart Failure Team Pager [phone] (M-F; 7a - 4p)  Please contact [facility] for night-coverage after hours (4p -7a ) and weekends on [email or web address]  Patient seen and examined with the above-signed Advanced Practice Provider and/or Housestaff. I personally reviewed laboratory data, imaging studies and relevant notes. I independently examined the patient and formulated the important aspects of the plan. I have edited the note to reflect any of my changes or salient points. I have personally discussed the plan with the patient and/or family.  Remains on milrinone. Co-ox ok On midodrine for BP support. Volume status much improved. Surveillance cx drawn. Remains negative.  Feels weak. Denies CP, SOB, orthopnea or PND.   General: obese male lying in bed No resp difficulty HEENT: normal Neck: supple. no JVD. Carotids 2+ bilat; no bruits. No lymphadenopathy  or thryomegaly appreciated. Cor: PMI nondisplaced. Irregular rate & rhythm. No rubs, gallops or murmurs. Lungs: clear Abdomen: obese soft, nontender, nondistended. No hepatosplenomegaly. No bruits or masses. Good bowel sounds. Extremities: no cyanosis, clubbing, rash, tr-1+ edema  + UNNA Neuro: alert & orientedx3, cranial nerves grossly intact. moves all 4 extremities w/o difficulty. Affect pleasant  He remains tenuous. Still with very frequent PVCs. Will add mexilitene. Stop milrinone. Change diuretics to oral. Continue Ancef. Await surveillance culture data. TEE tomorrow.   Glori Bickers MD

## 2021-02-05 ENCOUNTER — Inpatient Hospital Stay (HOSPITAL_COMMUNITY): Payer: Medicare HMO

## 2021-02-05 ENCOUNTER — Encounter (HOSPITAL_COMMUNITY): Payer: Self-pay | Admitting: Internal Medicine

## 2021-02-05 ENCOUNTER — Inpatient Hospital Stay (HOSPITAL_COMMUNITY): Payer: Medicare HMO | Admitting: Anesthesiology

## 2021-02-05 ENCOUNTER — Encounter (HOSPITAL_COMMUNITY)
Admission: AD | Disposition: A | Discharge status: Home / Self Care | Payer: Self-pay | Source: Other Acute Inpatient Hospital | Attending: Family Medicine

## 2021-02-05 DIAGNOSIS — I340 Nonrheumatic mitral (valve) insufficiency: Secondary | ICD-10-CM

## 2021-02-05 DIAGNOSIS — I5043 Acute on chronic combined systolic (congestive) and diastolic (congestive) heart failure: Secondary | ICD-10-CM

## 2021-02-05 HISTORY — PX: TEE WITHOUT CARDIOVERSION: SHX5443

## 2021-02-05 LAB — COMPREHENSIVE METABOLIC PANEL
ALT: 17 U/L (ref 0–44)
AST: 33 U/L (ref 15–41)
Albumin: 2.4 g/dL — ABNORMAL LOW (ref 3.5–5.0)
Alkaline Phosphatase: 93 U/L (ref 38–126)
Anion gap: 8 (ref 5–15)
BUN: 40 mg/dL — ABNORMAL HIGH (ref 8–23)
CO2: 29 mmol/L (ref 22–32)
Calcium: 8.7 mg/dL — ABNORMAL LOW (ref 8.9–10.3)
Chloride: 90 mmol/L — ABNORMAL LOW (ref 98–111)
Creatinine, Ser: 1.05 mg/dL (ref 0.61–1.24)
GFR, Estimated: >60 mL/min (ref >60)
Glucose, Bld: 92 mg/dL (ref 70–99)
Potassium: 4.6 mmol/L (ref 3.5–5.1)
Sodium: 127 mmol/L — ABNORMAL LOW (ref 135–145)
Total Bilirubin: 0.9 mg/dL (ref 0.3–1.2)
Total Protein: 6 g/dL — ABNORMAL LOW (ref 6.5–8.1)

## 2021-02-05 LAB — CBC
HCT: 41.1 % (ref 39.0–52.0)
Hemoglobin: 13.5 g/dL (ref 13.0–17.0)
MCH: 28.1 pg (ref 26.0–34.0)
MCHC: 32.8 g/dL (ref 30.0–36.0)
MCV: 85.4 fL (ref 80.0–100.0)
Platelets: 277 10*3/uL (ref 150–400)
RBC: 4.81 MIL/uL (ref 4.22–5.81)
RDW: 15.2 % (ref 11.5–15.5)
WBC: 13.3 10*3/uL — ABNORMAL HIGH (ref 4.0–10.5)
nRBC: 0 % (ref 0.0–0.2)

## 2021-02-05 LAB — MAGNESIUM: Magnesium: 2.2 mg/dL (ref 1.7–2.4)

## 2021-02-05 LAB — PHOSPHORUS: Phosphorus: 3.6 mg/dL (ref 2.5–4.6)

## 2021-02-05 LAB — CULTURE, BLOOD (ROUTINE X 2)
Culture: NO GROWTH
Culture: NO GROWTH

## 2021-02-05 LAB — COOXEMETRY PANEL
Carboxyhemoglobin: 1 % (ref 0.5–1.5)
Methemoglobin: 0.7 % (ref 0.0–1.5)
O2 Saturation: 87.5 %
Total hemoglobin: 14 g/dL (ref 12.0–16.0)

## 2021-02-05 LAB — GLUCOSE, CAPILLARY
Glucose-Capillary: 109 mg/dL — ABNORMAL HIGH (ref 70–99)
Glucose-Capillary: 96 mg/dL (ref 70–99)
Glucose-Capillary: 149 mg/dL — ABNORMAL HIGH (ref 70–99)
Glucose-Capillary: 84 mg/dL (ref 70–99)

## 2021-02-05 SURGERY — ECHOCARDIOGRAM, TRANSESOPHAGEAL
Anesthesia: Monitor Anesthesia Care

## 2021-02-05 MED ORDER — SODIUM CHLORIDE 0.9 % IV SOLN: INTRAVENOUS | Status: DC | PRN

## 2021-02-05 MED ORDER — PROPOFOL 500 MG/50ML IV EMUL
INTRAVENOUS | Status: DC | PRN
  Administered 2021-02-05: 100 ug/kg/min via INTRAVENOUS

## 2021-02-05 MED ORDER — PHENYLEPHRINE HCL-NACL 20-0.9 MG/250ML-% IV SOLN
INTRAVENOUS | Status: DC | PRN
  Administered 2021-02-05: 25 ug/min via INTRAVENOUS

## 2021-02-05 NOTE — CV Procedure (Signed)
    TRANSESOPHAGEAL ECHOCARDIOGRAM   NAME:  Kendarious Gudino   MRN: [id number] DOB:  Dec 16, 1945   ADMIT DATE: 01/29/2021  INDICATIONS:   PROCEDURE:   Informed consent was obtained prior to the procedure. The risks, benefits and alternatives for the procedure were discussed and the patient comprehended these risks.  Risks include, but are not limited to, cough, sore throat, vomiting, nausea, somnolence, esophageal and stomach trauma or perforation, bleeding, low blood pressure, aspiration, pneumonia, infection, trauma to the teeth and death.    After a procedural time-out, the patient was sedated by the anesthesia service. Once an appropriate level of sedation was achieved, the transesophageal probe was inserted in the esophagus and stomach without difficulty and multiple views were obtained.    COMPLICATIONS:    There were no immediate complications.  FINDINGS:  LEFT VENTRICLE: EF = 25-30%. No regional wall motion abnormalities.  RIGHT VENTRICLE: Moderately reduced function   LEFT ATRIUM: Severely dilated  LEFT ATRIAL APPENDAGE: No thrombus.   RIGHT ATRIUM: Severely dilated  AORTIC VALVE:  [place]. No vegetation  MITRAL VALVE:    Normal. Moderate MR. No vegetation  TRICUSPID VALVE: Normal. Mild TR. No vegetation   PULMONIC VALVE: Grossly normal. Trivial PR. No vegetation   INTERATRIAL SEPTUM: No PFO or ASD.  PERICARDIUM: No effusion  DESCENDING AORTA: Moderate plaque   CONCLUSION:  No TEE evidence of endocarditis.    Algis Lehenbauer,MD 12:41 PM

## 2021-02-05 NOTE — Progress Notes (Signed)
  Echocardiogram Echocardiogram Transesophageal has been performed.  Johny Chess 02/05/2021, 12:20 PM

## 2021-02-05 NOTE — Interval H&P Note (Signed)
History and Physical Interval Note:  02/05/2021 11:43 AM  Anthony Weber  has presented today for surgery, with the diagnosis of bacteremia.  The various methods of treatment have been discussed with the patient and family. After consideration of risks, benefits and other options for treatment, the patient has consented to  Procedure(s): TRANSESOPHAGEAL ECHOCARDIOGRAM (TEE) (N/A) as a surgical intervention.  The patient's history has been reviewed, patient examined, no change in status, stable for surgery.  I have reviewed the patient's chart and labs.  Questions were answered to the patient's satisfaction.     [name]

## 2021-02-05 NOTE — Transfer of Care (Signed)
Immediate Anesthesia Transfer of Care Note  Patient: Anthony Weber  Procedure(s) Performed: TRANSESOPHAGEAL ECHOCARDIOGRAM (TEE)  Patient Location: PACU  Anesthesia Type:MAC  Level of Consciousness: drowsy  Airway & Oxygen Therapy: Patient Spontanous Breathing and Patient connected to nasal cannula oxygen  Post-op Assessment: Report given to RN and Post -op Vital signs reviewed and stable  Post vital signs: Reviewed and stable  Last Vitals:  Vitals Value Taken Time  BP    Temp    Pulse    Resp    SpO2      Last Pain:  Vitals:   02/05/21 1109  TempSrc: Temporal  PainSc: 0-No pain      Patients Stated Pain Goal: 0 (04/59/97 7414)  Complications: No notable events documented.

## 2021-02-05 NOTE — TOC Progression Note (Signed)
Transition of Care [facility] - Progression Note    Patient Details  Name: Maude Gloor MRN: [id number] Date of Birth: [birth year]  Transition of Care [facility]) CM/SW [place], [place] Phone Number: 02/05/2021, 5:40 PM  Clinical Narrative:    HF CSW attempted to outreach [facility], SNF to follow up regarding a bed for rehab however Tammy at [facility] did not answer the phone and CSW left a voicemail for her to return the call. CSW will continue to outreach as the patient and his spouse report [facility], SNF to be their preference.   CSW will continue to follow throughout discharge.   Expected Discharge Plan: [place] Barriers to Discharge: Continued Medical Work up  Expected Discharge Plan and Services Expected Discharge Plan: [place] In-house Referral: Clinical Social Work Discharge Planning Services: CM Consult   Living arrangements for the past 2 months: Single Family Home                                       Social Determinants of Health (SDOH) Interventions    Readmission Risk Interventions No flowsheet data found.  Alyas Creary, MSW, [place] Heart Failure Social Worker

## 2021-02-05 NOTE — Progress Notes (Signed)
OT Cancellation Note  Patient Details Name: Anthony Weber MRN: [id number] DOB: [birth year]   Cancelled Treatment:    Reason Eval/Treat Not Completed:  (Pt preparing to go to TEE. Will continue to follow.)  [name] 02/05/2021, 9:42 AM Nestor Lewandowsky, OTR/L Acute Rehabilitation Services Pager: [phone] Office: [phone]

## 2021-02-05 NOTE — Progress Notes (Signed)
[facility]  Date of Admission:  01/29/2021     Total days of antibiotics 8         ASSESSMENT:  [name] culture from 02/04/21 are without growth in <24 hours. TEE is negative for endocarditis. Clinically appears to be improving. Will need PICC line placed once 02/04/21 cultures are clear for at least 48 hours and then will plan for 4 weeks of Cefazolin with tentative end date of 11/23. Will continue to monitor cultures for clearance of bacteremia. Continue current dose of Cefazolin. Remaining medical and supportive care per Primary Team and Cardiology.   PLAN:  Continue current dose of Cefazolin.  Monitor cultures for clearance of bacteremia. Will need PICC line placement prior to discharge once cultures blood cultures are cleared. Remaining medical and supportive care per Primary Team and Cardiology.   Principal Problem:   MSSA bacteremia Active Problems:   Cardiogenic shock (HCC)   Central line infection   Pneumonia of both lower lobes due to methicillin susceptible Staphylococcus aureus (MSSA) (HCC)   Hyponatremia   Acute on chronic combined systolic and diastolic congestive heart failure (HCC)   Acute on chronic respiratory failure with hypoxia and hypercapnia (HCC)    amiodarone  200 mg Oral BID   Chlorhexidine Gluconate Cloth  6 each Topical Daily   heparin  5,000 Units Subcutaneous Q8H   insulin aspart  0-15 Units Subcutaneous TID WC   levothyroxine  50 mcg Oral Q0600   mexiletine  150 mg Oral Q12H   midodrine  10 mg Oral TID WC   polyethylene glycol  17 g Oral BID   pravastatin  10 mg Oral q1800   senna-docusate  1 tablet Oral BID   torsemide  20 mg Oral Daily    SUBJECTIVE:  Afebrile overnight with no acute events. TEE without evidence of endocarditis. Denies fevers, chills, or shortness of breath. No new concerns/complaints. Anthony Weber and Anthony Weber at bedside.   No Known Allergies   Review of Systems: Review of Systems   Constitutional:  Negative for chills, fever and weight loss.  Respiratory:  Negative for cough, shortness of breath and wheezing.   Cardiovascular:  Negative for chest pain and leg swelling.  Gastrointestinal:  Negative for abdominal pain, constipation, diarrhea, nausea and vomiting.  Skin:  Negative for rash.     OBJECTIVE: Vitals:   02/05/21 0900 02/05/21 1109 02/05/21 1210 02/05/21 1258  BP: (!) 108/59 (!) 111/59 (!) 101/52 (!) 98/58  Pulse: 61 69 (!) 39 64  Resp: 18 12 18 15   Temp:  (!) 97.1 F (36.2 C) 98.9 F (37.2 C)   TempSrc:  Temporal    SpO2: 95% 98% 97% 90%  Weight:  134.3 kg    Height:  5\' 10"  (1.778 m)     Body mass index is 42.48 kg/m.  Physical Exam Constitutional:      General: He is not in acute distress.    Appearance: He is well-developed.     Comments: Lying in bed with head of bed elevated; pleasant.   Cardiovascular:     Rate and Rhythm: Normal rate and regular rhythm.     Heart sounds: Normal heart sounds.  Pulmonary:     Effort: Pulmonary effort is normal.     Breath sounds: Normal breath sounds.  Skin:    General: Skin is warm and dry.  Neurological:     Mental Status: He is alert and oriented to person, place, and time.  Psychiatric:  Behavior: Behavior normal.        Thought Content: Thought content normal.        Judgment: Judgment normal.    Lab Results Lab Results  Component Value Date   WBC 13.3 (H) 02/05/2021   HGB 13.5 02/05/2021   HCT 41.1 02/05/2021   MCV 85.4 02/05/2021   PLT 277 02/05/2021    Lab Results  Component Value Date   CREATININE 1.05 02/05/2021   BUN 40 (H) 02/05/2021   NA 127 (L) 02/05/2021   K 4.6 02/05/2021   CL 90 (L) 02/05/2021   CO2 29 02/05/2021    Lab Results  Component Value Date   ALT 17 02/05/2021   AST 33 02/05/2021   ALKPHOS 93 02/05/2021   BILITOT 0.9 02/05/2021     Microbiology: Recent Results (from the past 240 hour(s))  CULTURE, BLOOD (ROUTINE X 2) w Reflex to ID Panel      Status: Abnormal   Collection Time: 01/29/21  2:00 PM   Specimen: BLOOD  Result Value Ref Range Status   Specimen Description   Final    BLOOD LEFT ANTECUBITAL Performed at [facility], [address] [place], [place] [address]    Special Requests   Final    BOTTLES DRAWN AEROBIC AND ANAEROBIC Blood Culture adequate volume Performed at [facility], [address] [place], [place] [address]    Culture  Setup Time   Final    IN BOTH AEROBIC AND ANAEROBIC BOTTLES GRAM POSITIVE COCCI CRITICAL RESULT CALLED TO, READ BACK BY AND VERIFIED WITH: GREGG ABBOTT @0345  ON 01/30/21 SKL Performed at [facility], [place] [address] [place], [place] [address]    Culture STAPHYLOCOCCUS AUREUS (A)  Final   Report Status 02/01/2021 FINAL  Final   Organism ID, Bacteria STAPHYLOCOCCUS AUREUS  Final      Susceptibility   Staphylococcus aureus - MIC*    CIPROFLOXACIN <=0.5 SENSITIVE Sensitive     ERYTHROMYCIN >=8 RESISTANT Resistant     GENTAMICIN <=0.5 SENSITIVE Sensitive     OXACILLIN <=0.25 SENSITIVE Sensitive     TETRACYCLINE <=1 SENSITIVE Sensitive     VANCOMYCIN <=0.5 SENSITIVE Sensitive     TRIMETH/SULFA <=10 SENSITIVE Sensitive     CLINDAMYCIN RESISTANT Resistant     RIFAMPIN <=0.5 SENSITIVE Sensitive     Inducible Clindamycin POSITIVE Resistant     * STAPHYLOCOCCUS AUREUS  Blood Culture ID Panel (Reflexed)     Status: Abnormal   Collection Time: 01/29/21  2:00 PM  Result Value Ref Range Status   Enterococcus faecalis NOT DETECTED NOT DETECTED Final   Enterococcus Faecium NOT DETECTED NOT DETECTED Final   Listeria monocytogenes NOT DETECTED NOT DETECTED Final   Staphylococcus species DETECTED (A) NOT DETECTED Final    Comment: CRITICAL RESULT CALLED TO, READ BACK BY AND VERIFIED WITH: GREGG ABBOTT @0345  ON 01/30/21 SKL    Staphylococcus aureus (BCID) DETECTED (A) NOT DETECTED Final    Comment: CRITICAL RESULT CALLED TO, READ BACK BY AND VERIFIED WITH: GREGG  ABBOTT @0345  ON 01/30/21 SKL    Staphylococcus epidermidis NOT DETECTED NOT DETECTED Final   Staphylococcus lugdunensis NOT DETECTED NOT DETECTED Final   Streptococcus species NOT DETECTED NOT DETECTED Final   Streptococcus agalactiae NOT DETECTED NOT DETECTED Final   Streptococcus pneumoniae NOT DETECTED NOT DETECTED Final   Streptococcus pyogenes NOT DETECTED NOT DETECTED Final   A.calcoaceticus-baumannii NOT DETECTED NOT DETECTED Final   Bacteroides fragilis NOT DETECTED NOT DETECTED Final  Enterobacterales NOT DETECTED NOT DETECTED Final   Enterobacter cloacae complex NOT DETECTED NOT DETECTED Final   Escherichia coli NOT DETECTED NOT DETECTED Final   Klebsiella aerogenes NOT DETECTED NOT DETECTED Final   Klebsiella oxytoca NOT DETECTED NOT DETECTED Final   Klebsiella pneumoniae NOT DETECTED NOT DETECTED Final   Proteus species NOT DETECTED NOT DETECTED Final   Salmonella species NOT DETECTED NOT DETECTED Final   Serratia marcescens NOT DETECTED NOT DETECTED Final   Haemophilus influenzae NOT DETECTED NOT DETECTED Final   Neisseria meningitidis NOT DETECTED NOT DETECTED Final   Pseudomonas aeruginosa NOT DETECTED NOT DETECTED Final   Stenotrophomonas maltophilia NOT DETECTED NOT DETECTED Final   Candida albicans NOT DETECTED NOT DETECTED Final   Candida auris NOT DETECTED NOT DETECTED Final   Candida glabrata NOT DETECTED NOT DETECTED Final   Candida krusei NOT DETECTED NOT DETECTED Final   Candida parapsilosis NOT DETECTED NOT DETECTED Final   Candida tropicalis NOT DETECTED NOT DETECTED Final   Cryptococcus neoformans/gattii NOT DETECTED NOT DETECTED Final   Meth resistant mecA/C and MREJ NOT DETECTED NOT DETECTED Final    Comment: Performed at [facility], [address] [place], [place] [address]  CULTURE, BLOOD (ROUTINE X 2) w Reflex to ID Panel     Status: Abnormal   Collection Time: 01/29/21  2:03 PM   Specimen: BLOOD  Result Value Ref Range Status    Specimen Description   Final    BLOOD BLOOD LEFT HAND Performed at [facility], [address] [place], [place] [address]    Special Requests   Final    BOTTLES DRAWN AEROBIC AND ANAEROBIC Blood Culture adequate volume Performed at [facility], [address] [place], [place] [address]    Culture  Setup Time   Final    IN BOTH AEROBIC AND ANAEROBIC BOTTLES GRAM POSITIVE COCCI CRITICAL VALUE NOTED.  VALUE IS CONSISTENT WITH PREVIOUSLY REPORTED AND CALLED VALUE. Performed at [facility], [address] [place], [place] [address]    Culture (A)  Final    STAPHYLOCOCCUS AUREUS SUSCEPTIBILITIES PERFORMED ON PREVIOUS CULTURE WITHIN THE LAST 5 DAYS. Performed at [facility], [place] [address] [place], [place] [address]    Report Status 02/01/2021 FINAL  Final  Urine Culture     Status: None   Collection Time: 01/29/21  8:38 PM   Specimen: Urine, Catheterized  Result Value Ref Range Status   Specimen Description URINE, CATHETERIZED  Final   Special Requests NONE  Final   Culture   Final    NO GROWTH Performed at [facility], [place] [address], [place], [place] [address]    Report Status 01/31/2021 FINAL  Final  Expectorated Sputum Assessment w Gram Stain, Rflx to Resp Cult     Status: None   Collection Time: 01/29/21  8:38 PM   Specimen: Sputum  Result Value Ref Range Status   Specimen Description SPUTUM  Final   Special Requests NONE  Final   Sputum evaluation   Final    Sputum specimen not acceptable for testing.  Please recollect.   RESULT CALLED TO, READ BACK BY AND VERIFIED WITH: RN KOBE 01/30/21@00 :36 Performed at [facility], [place] [address], [place], [place] [address]    Report Status 01/31/2021 FINAL  Final  Expectorated Sputum Assessment w Gram Stain, Rflx to Resp Cult     Status: None   Collection Time: 01/30/21  9:04 AM   Specimen: Sputum  Result Value Ref Range Status   Specimen Description SPUTUM  Final   Special  Requests  EXPECTORATED  Final   Sputum evaluation   Final    THIS SPECIMEN IS ACCEPTABLE FOR SPUTUM CULTURE Performed at [facility], [place] [address] [place], [place] [address]    Report Status 01/30/2021 FINAL  Final  Culture, Respiratory w Gram Stain     Status: None   Collection Time: 01/30/21  9:04 AM   Specimen: SPU  Result Value Ref Range Status   Specimen Description SPUTUM  Final   Special Requests  EXPECTORATED Reflexed from [id number]  Final   Gram Stain   Final    FEW SQUAMOUS EPITHELIAL CELLS PRESENT FEW WBC SEEN FEW GRAM POSITIVE COCCI    Culture   Final    FEW Consistent with normal respiratory flora. No Pseudomonas species isolated Performed at [place] [address], [place], [place] [address]    Report Status 02/02/2021 FINAL  Final  Culture, blood (routine x 2)     Status: None   Collection Time: 01/30/21 10:08 AM   Specimen: BLOOD RIGHT HAND  Result Value Ref Range Status   Specimen Description BLOOD RIGHT HAND  Final   Special Requests   Final    BOTTLES DRAWN AEROBIC AND ANAEROBIC Blood Culture adequate volume   Culture   Final    NO GROWTH 5 DAYS Performed at [facility], [place] [address], [place], [place] [address]    Report Status 02/04/2021 FINAL  Final  Culture, blood (routine x 2)     Status: None   Collection Time: 01/30/21 10:09 AM   Specimen: BLOOD LEFT HAND  Result Value Ref Range Status   Specimen Description BLOOD LEFT HAND  Final   Special Requests   Final    BOTTLES DRAWN AEROBIC AND ANAEROBIC Blood Culture adequate volume   Culture   Final    NO GROWTH 5 DAYS Performed at [facility], [place] [address] [place], [place] [address]    Report Status 02/04/2021 FINAL  Final  MRSA Next Gen by PCR, Nasal     Status: None   Collection Time: 01/30/21 11:01 AM   Specimen: Nasal Mucosa; Nasal Swab  Result Value Ref Range Status   MRSA by PCR Next Gen NOT DETECTED NOT DETECTED Final    Comment: (NOTE) The GeneXpert  MRSA Assay (FDA approved for NASAL specimens only), is one component of a comprehensive MRSA colonization surveillance program. It is not intended to diagnose MRSA infection nor to guide or monitor treatment for MRSA infections. Test performance is not FDA approved in patients less than 73 years old. Performed at [facility], [place] [address] [place], [place] [address]   Culture, blood (routine x 2)     Status: None   Collection Time: 01/31/21  6:08 AM   Specimen: BLOOD RIGHT HAND  Result Value Ref Range Status   Specimen Description BLOOD RIGHT HAND  Final   Special Requests   Final    AEROBIC BOTTLE ONLY Blood Culture results may not be optimal due to an inadequate volume of blood received in culture bottles   Culture   Final    NO GROWTH 5 DAYS Performed at [facility], [place] [address] [place], [place] [address]    Report Status 02/05/2021 FINAL  Final  Culture, blood (routine x 2)     Status: None   Collection Time: 01/31/21  3:06 PM   Specimen: BLOOD  Result Value Ref Range Status   Specimen Description BLOOD SITE NOT SPECIFIED  Final   Special Requests   Final    BOTTLES DRAWN AEROBIC ONLY Blood Culture results may not be optimal due to an inadequate volume of blood received in culture bottles   Culture   Final    NO GROWTH 5 DAYS Performed at [facility], [place] [address] [place], [place] [address]    Report Status 02/05/2021 FINAL  Final  Culture, blood (routine x 2)     Status: None (Preliminary result)   Collection Time: 02/04/21 12:56 PM   Specimen: BLOOD  Result Value Ref Range Status   Specimen Description BLOOD LEFT ANTECUBITAL  Final   Special Requests   Final    BOTTLES DRAWN AEROBIC AND ANAEROBIC Blood Culture adequate volume   Culture   Final    NO GROWTH < 24 HOURS Performed at [facility], [place] [address] [place], [place] [address]    Report Status PENDING  Incomplete  Culture, blood (routine x 2)     Status: None (Preliminary  result)   Collection Time: 02/04/21  1:03 PM   Specimen: BLOOD  Result Value Ref Range Status   Specimen Description BLOOD LEFT ANTECUBITAL  Final   Special Requests   Final    BOTTLES DRAWN AEROBIC AND ANAEROBIC Blood Culture adequate volume   Culture   Final    NO GROWTH < 24 HOURS Performed at [facility], [place] [address] [place], [place] [address]    Report Status PENDING  Incomplete     Terri Piedra, [facility]  02/05/2021  1:00 PM

## 2021-02-05 NOTE — Progress Notes (Signed)
Physical Therapy Treatment Patient Details Name: Anthony Weber MRN: [id number] DOB: December 30, 1945 Today's Date: 02/05/2021   History of Present Illness Pt is a 75 y.o. male admitted to [facility] on 01/18/21 with acute heart failure exacerbation; pt developed cardiogenic shock, acute respiratory failure, PNA with transfer to [facility] on 10/20. Course complicated by MSSA bacteremia, encephalopathy. PMH includes CHF, HTN, morbid obesity.    PT Comments    The pt was able to participate in limited session this afternoon following TEE earlier in the day. The pt presents with slightly decreased alertness and increased need for cues for all mobility. He required modA of 2 to power up to standing and demos poor anterior translation during stand, needing modA to 2 to complete as well as cues for hip extension. The pt was able to ambulate short distances within the room with use of rollator, but required heavy assist for balance, management of RW, repeated cues to initiate stepping, and chair follow due to decreased endurance at this time. The pt was able to maintain all activity on RA with SpO2 in 90s. Will continue to benefit from skilled PT acutely to progress functional strength, power, and endurance, but continues to demo need for post-acute rehab due to deconditioning and need for significant physical assist to complete basic transfers.    Recommendations for follow up therapy are one component of a multi-disciplinary discharge planning process, led by the attending physician.  Recommendations may be updated based on patient status, additional functional criteria and insurance authorization.  Follow Up Recommendations  Skilled nursing-short term rehab (<3 hours/day)     Assistance Recommended at Discharge Frequent or constant Supervision/Assistance  Equipment Recommendations  None recommended by PT    Recommendations for Other Services       Precautions / Restrictions Precautions Precautions:  Fall Precaution Comments: monitor O2 Restrictions Weight Bearing Restrictions: No     Mobility  Bed Mobility Overal bed mobility: Needs Assistance Bed Mobility: Supine to Sit     Supine to sit: Mod assist     General bed mobility comments: assist to raise trunk, increased time, max verbal cues for technique and to self assist    Transfers Overall transfer level: Needs assistance Equipment used: Rollator (4 wheels) Transfers: Sit to/from Stand Sit to Stand: +2 physical assistance;Mod assist           General transfer comment: cues for hand placement, assist to rise and steady as pt positioned his feet and transitioned hands to walker    Ambulation/Gait Ambulation/Gait assistance: Mod assist;+2 physical assistance Gait Distance (Feet): 12 Feet Assistive device: Rollator (4 wheels) Gait Pattern/deviations: Step-through pattern;Decreased stride length;Trunk flexed Gait velocity: Decreased Gait velocity interpretation: <1.31 ft/sec, indicative of household ambulator General Gait Details: pt with inconsistent step length, width, and clearance. Needing cues at times to initiate stepping as well as maxA to maintain position of RW as pt pushing too far away from his body to be supportive. max cues for hip extension and to maintain upright posture.         Balance Overall balance assessment: Needs assistance Sitting-balance support: No upper extremity supported;Feet supported Sitting balance-Leahy Scale: Fair     Standing balance support: Bilateral upper extremity supported;During functional activity;Reliant on assistive device for balance Standing balance-Leahy Scale: Poor Standing balance comment: B UE support and external assist                            Cognition Arousal/Alertness:  Awake/alert Behavior During Therapy: Flat affect Overall Cognitive Status: Impaired/Different from baseline Area of Impairment: Following commands;Problem solving                        Following Commands: Follows one step commands with increased time     Problem Solving: Slow processing;Decreased initiation;Difficulty sequencing;Requires verbal cues General Comments: pt requiring step by step cues for all mobility        Exercises      General Comments General comments (skin integrity, edema, etc.): SpO2 stable on RA (left on 96%). HR stable with all mobility      Pertinent Vitals/Pain Pain Assessment: Faces Pain Score: 0-No pain Faces Pain Scale: No hurt Pain Intervention(s): Monitored during session     PT Goals (current goals can now be found in the care plan section) Acute Rehab PT Goals Patient Stated Goal: to return home PT Goal Formulation: With patient Time For Goal Achievement: 02/15/21 Potential to Achieve Goals: Fair Progress towards PT goals: Progressing toward goals    Frequency    Min 2X/week      PT Plan Frequency needs to be updated    Co-evaluation PT/OT/SLP Co-Evaluation/Treatment: Yes Reason for Co-Treatment: Necessary to address cognition/behavior during functional activity;For patient/therapist safety;To address functional/ADL transfers PT goals addressed during session: Mobility/safety with mobility;Proper use of DME;Balance OT goals addressed during session: Strengthening/ROM      AM-PAC PT "6 Clicks" Mobility   Outcome Measure  Help needed turning from your back to your side while in a flat bed without using bedrails?: A Lot Help needed moving from lying on your back to sitting on the side of a flat bed without using bedrails?: A Lot Help needed moving to and from a bed to a chair (including a wheelchair)?: A Lot Help needed standing up from a chair using your arms (e.g., wheelchair or bedside chair)?: A Lot Help needed to walk in hospital room?: A Lot Help needed climbing 3-5 steps with a railing? : A Lot 6 Click Score: 12    End of Session Equipment Utilized During Treatment: Gait  belt;Oxygen Activity Tolerance: Patient tolerated treatment well Patient left: in chair;with call bell/phone within reach;with family/visitor present Nurse Communication: Mobility status PT Visit Diagnosis: Other abnormalities of gait and mobility (R26.89);Muscle weakness (generalized) (M62.81)     Time: 2440-1027 PT Time Calculation (min) (ACUTE ONLY): 30 min  Charges:  $Gait Training: 8-22 mins                     West Carbo, PT, DPT   Acute Rehabilitation Department Pager #: [phone]   Anthony Weber 02/05/2021, 4:00 PM

## 2021-02-05 NOTE — Progress Notes (Addendum)
Patient to 43E23. Vital signs obtained. On monitor CCMD notified. CHG bath completed. Alert and oriented to room and call light call bell within reach. Will continue to monitor.  Era Bumpers, RN

## 2021-02-05 NOTE — H&P (View-Only) (Signed)
Advanced Heart Failure Rounding Note   Subjective:    Admit weight 325 pounds --> 301 pounds -> 296. Now on po diuretics  Off milrinone. Remains on midodrine for BP support.   Feels weak. Denies SOB, orthopnea or PND. Denies fevers. Lines and Foley out.   Objective:   Weight Range:  Vital Signs:   Temp:  [97.1 F (36.2 C)-98.5 F (36.9 C)] 97.9 F (36.6 C) (10/27 0400) Pulse Rate:  [44-77] 71 (10/27 0800) Resp:  [12-22] 13 (10/27 0800) BP: (85-126)/(53-88) 125/73 (10/27 0800) SpO2:  [92 %-99 %] 99 % (10/27 0800) Weight:  [134.3 kg] 134.3 kg (10/27 0500) Last BM Date: 02/03/21  Weight change: Filed Weights   02/03/21 0500 02/04/21 0500 02/05/21 0500  Weight: (!) 137 kg (!) 136.8 kg 134.3 kg    Intake/Output:   Intake/Output Summary (Last 24 hours) at 02/05/2021 0950 Last data filed at 02/05/2021 0800 Gross per 24 hour  Intake 340.83 ml  Output 1950 ml  Net -1609.17 ml     Physical Exam: General:  Obese male lying in bed. No resp difficulty HEENT: normal Neck: supple. JVP 6-7  Carotids 2+ bilat; no bruits. No lymphadenopathy or thryomegaly appreciated. Cor: PMI nondisplaced. Regular rate & rhythm. No rubs, gallops or murmurs. Lungs: clear Abdomen: obese soft, nontender, nondistended. No hepatosplenomegaly. No bruits or masses. Good bowel sounds. Extremities: no cyanosis, clubbing, rash, tr edema Neuro: alert & orientedx3, cranial nerves grossly intact. moves all 4 extremities w/o difficulty. Affect pleasant    Telemetry: SR with frequent PVCs 80s   Labs: Basic Metabolic Panel: Recent Labs  Lab 01/31/21 0500 01/31/21 1745 02/01/21 0515 02/02/21 0510 02/03/21 0517 02/04/21 0435 02/05/21 0418  NA 124*   < > 129* 130* 127* 127* 127*  K 3.8   < > 4.1 4.5 3.7 4.4 4.6  CL 81*   < > 82* 83* 82* 85* 90*  CO2 33*   < > 37* 39* 37* 33* 29  GLUCOSE 119*   < > 123* 114* 127* 100* 92  BUN 76*   < > 71* 63* 54* 47* 40*  CREATININE 1.65*   < > 1.19 1.06  1.06 1.01 1.05  CALCIUM 7.9*   < > 8.5* 8.4* 8.3* 8.2* 8.7*  MG 2.2  --  2.1 2.2 2.0  --  2.2  PHOS 3.2  --  2.6 2.8 3.2  --  3.6   < > = values in this interval not displayed.     Liver Function Tests: Recent Labs  Lab 02/01/21 0515 02/02/21 0510 02/03/21 0517 02/04/21 0435 02/05/21 0418  AST 24 29 28 29  33  ALT 27 26 25 20 17   ALKPHOS 75 71 83 84 93  BILITOT 1.4* 1.0 1.0 0.9 0.9  PROT 5.8* 5.8* 5.7* 5.7* 6.0*  ALBUMIN 2.3* 2.2* 2.1* 2.1* 2.4*    No results for input(s): LIPASE, AMYLASE in the last 168 hours. Recent Labs  Lab 01/31/21 1106  AMMONIA 19     CBC: Recent Labs  Lab 01/31/21 0500 01/31/21 1745 02/01/21 0515 02/02/21 0510 02/03/21 0517 02/05/21 0418  WBC 13.7*  --  12.5* 11.5* 13.5* 13.3*  HGB 13.1 14.3 13.1 13.4 13.0 13.5  HCT 38.6* 42.0 39.3 39.5 38.5* 41.1  MCV 84.6  --  85.1 85.7 85.0 85.4  PLT 132*  --  153 177 209 277     Cardiac Enzymes: No results for input(s): CKTOTAL, CKMB, CKMBINDEX, TROPONINI in the last 168 hours.  BNP:  BNP (last 3 results) Recent Labs    01/19/21 1237 01/27/21 0447 01/29/21 2020  BNP 942.4* 681.0* 333.2*     ProBNP (last 3 results) No results for input(s): PROBNP in the last 8760 hours.    Other results:  Imaging: DG CHEST PORT 1 VIEW  Result Date: 02/03/2021 CLINICAL DATA:  Central line placement EXAM: PORTABLE CHEST 1 VIEW COMPARISON:  01/29/2021 FINDINGS: Right central line in place with the tip in the SVC. No pneumothorax. Cardiomegaly with vascular congestion. Left lower lobe consolidation with probable left effusion. No confluent opacity on the right. IMPRESSION: Right central line tip in the SVC.  No pneumothorax. Cardiomegaly, vascular congestion. Left lower lobe airspace opacity with left effusion. Cannot exclude pneumonia. Electronically Signed   By: Rolm Baptise M.D.   On: 02/03/2021 19:06     Medications:     Scheduled Medications:  amiodarone  200 mg Oral BID   Chlorhexidine  Gluconate Cloth  6 each Topical Daily   heparin  5,000 Units Subcutaneous Q8H   insulin aspart  0-15 Units Subcutaneous TID WC   levothyroxine  50 mcg Oral Q0600   mexiletine  150 mg Oral Q12H   midodrine  10 mg Oral TID WC   polyethylene glycol  17 g Oral BID   pravastatin  10 mg Oral q1800   senna-docusate  1 tablet Oral BID   torsemide  20 mg Oral Daily    Infusions:   ceFAZolin (ANCEF) IV 2 g (02/05/21 0547)    PRN Medications: acetaminophen, docusate sodium, ondansetron (ZOFRAN) IV, polyethylene glycol   Assessment/Plan:   1. Shock - Cardiogenic/Septic  - Initial presentation consistent with cardiogenic shock but now with septic overlay with PNA and MSSA bacteremia - Procalcitonin 0.8  Lactic Acid 1.2 . RHC cardiac index 1.5  - BCx 2/2 MSSA. Urine Cx negative - Now on Ancef.   -Off milrinone. Continue midodrine   2. Acute Systolic Heart Failure  - Echo EF now down from 55-->20-25%. Cath with no CAD, elevated filling pressures and low output heart failure.  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  - Continues with high PVC burden despite IV amio. Susepct PVC induced cardiomyopathy. Continue amio to suppress burden. Mexilitene added 10/26  - Off milrinone. Central lines out so no co-ox or CVP - Volume status ok. Continue po torsemide -- No room for GDMT with hypotension. Remains on midodrine 10 mg TID   3. Acute Hypoxic/Hypercarbia Respiratory Failure  - Improved with volume removal. Continue to wean oxygen as needed.  - Sats stable on 5 liters [place]. Continue wean as able.    4. NSVT, PVC  -High PVC burden On amio drip. PVCs burden reduced. Continue amio. Mexilitene added 10/26   Keep K> 4.0 Mg>2.0 - outpatient sleep study  5. MSSA bacteremia - likely respiratory source - now on ancef. ID following. Will need line holiday before placing Home IV access - TEE scheduled today at 1130a    6. AKI  - Creatinine peaked at 2.1 , now normalized    7. Hyponatremia - Sodium stable at 127  - Restrict free water  8. Morbid Obesity   Body mass index is 42.48 kg/m.  9.  AMS - ABC with CO2 retention   10. Suspected Sleep Apnea -will need outpatient sleep study  11. Hypothyroidism  Check TSH Start home dose of levothyroixine.    12. Hypokalemia - K 4.6 today  Length of Stay: 7   Glori Bickers MD 02/05/2021, 9:50 AM  Advanced Heart Failure Team Pager [phone] (M-F; 7a - 4p)  Please contact [facility] for night-coverage after hours (4p -7a ) and weekends on [email or web address]

## 2021-02-05 NOTE — Progress Notes (Signed)
Pt states he does not want to wear the BiPAP tonight and will call when he needs it.

## 2021-02-05 NOTE — Progress Notes (Signed)
PT Cancellation Note  Patient Details Name: Anthony Weber MRN: [id number] DOB: 1945/05/26   Cancelled Treatment:    Reason Eval/Treat Not Completed: Other (comment) Upon arrival of therapist this AM, pt awaiting transport to TEE. RN advised to attempt later after procedure.   West Carbo, PT, DPT   Acute Rehabilitation Department Pager #: [phone]   Sandra Cockayne 02/05/2021, 9:42 AM

## 2021-02-05 NOTE — Progress Notes (Signed)
Occupational Therapy Treatment Patient Details Name: Anthony Weber MRN: [id number] DOB: 02/24/1946 Today's Date: 02/05/2021   History of present illness Pt is a 75 y.o. male admitted to [facility] on 01/18/21 with acute heart failure exacerbation; pt developed cardiogenic shock, acute respiratory failure, PNA with transfer to [facility] on 10/20. Course complicated by MSSA bacteremia, encephalopathy. PMH includes CHF, HTN, morbid obesity.   OT comments  Pt readily willing to work on OOB activity with therapies s/p TEE earlier to day. Pt requiring heavy +2 mod assist from elevated surface to stand from bed x 1 and chair x 2. Pt requiring max verbal cues for sequencing. Ambulated with chair close behind with knee buckling noted. Physical assist needed for safety with rollator. Pt demonstrates delayed processing speed and decreased initiation. VSS on RA throughout session. Continue to be appropriate for SNF level rehab.    Recommendations for follow up therapy are one component of a multi-disciplinary discharge planning process, led by the attending physician.  Recommendations may be updated based on patient status, additional functional criteria and insurance authorization.    Follow Up Recommendations  Skilled nursing-short term rehab (<3 hours/day)    Assistance Recommended at Discharge Frequent or constant Supervision/Assistance  Equipment Recommendations  Other (comment) (defer to next venue)    Recommendations for Other Services      Precautions / Restrictions Precautions Precautions: Fall       Mobility Bed Mobility Overal bed mobility: Needs Assistance Bed Mobility: Supine to Sit     Supine to sit: Mod assist     General bed mobility comments: assist to raise trunk, increased time, max verbal cues for technique and to self assist    Transfers Overall transfer level: Needs assistance Equipment used: Rollator (4 wheels) Transfers: Sit to/from Stand Sit to Stand: +2 physical  assistance;Mod assist           General transfer comment: cues for hand placement, assist to rise and steady as pt positioned his feet and transitioned hands to walker     Balance Overall balance assessment: Needs assistance   Sitting balance-Leahy Scale: Fair     Standing balance support: Bilateral upper extremity supported;During functional activity Standing balance-Leahy Scale: Poor Standing balance comment: B UE support and external assist                           ADL either performed or assessed with clinical judgement   ADL                                       Functional mobility during ADLs: Minimal assistance;+2 for physical assistance;Rollator (4 wheels)       Vision       Perception     Praxis      Cognition Arousal/Alertness: Awake/alert Behavior During Therapy: Flat affect Overall Cognitive Status: Impaired/Different from baseline Area of Impairment: Following commands;Problem solving                       Following Commands: Follows one step commands with increased time     Problem Solving: Slow processing;Decreased initiation;Difficulty sequencing;Requires verbal cues General Comments: pt requiring step by step cues for all mobility          Exercises     Shoulder Instructions       General Comments  Pertinent Vitals/ Pain       Pain Assessment: Faces Faces Pain Scale: No hurt  Home Living                                          Prior Functioning/Environment              Frequency  Min 2X/week        Progress Toward Goals  OT Goals(current goals can now be found in the care plan section)  Progress towards OT goals: Progressing toward goals  Acute Rehab OT Goals Patient Stated Goal: to eat OT Goal Formulation: With patient Time For Goal Achievement: 02/16/21 Potential to Achieve Goals: Good  Plan Discharge plan remains appropriate    Co-evaluation     PT/OT/SLP Co-Evaluation/Treatment: Yes     OT goals addressed during session: Strengthening/ROM      AM-PAC OT "6 Clicks" Daily Activity     Outcome Measure   Help from another person eating meals?: None Help from another person taking care of personal grooming?: A Little Help from another person toileting, which includes using toliet, bedpan, or urinal?: Total Help from another person bathing (including washing, rinsing, drying)?: A Lot Help from another person to put on and taking off regular upper body clothing?: A Lot Help from another person to put on and taking off regular lower body clothing?: Total 6 Click Score: 13    End of Session Equipment Utilized During Treatment: Rollator (4 wheels)  OT Visit Diagnosis: Unsteadiness on feet (R26.81);Muscle weakness (generalized) (M62.81);Other abnormalities of gait and mobility (R26.89)   Activity Tolerance Patient limited by fatigue   Patient Left in chair;with call bell/phone within reach;with family/visitor present   Nurse Communication Mobility status        Time: 1428-1500 OT Time Calculation (min): 32 min  Charges: OT General Charges $OT Visit: 1 Visit OT Treatments $Therapeutic Activity: 8-22 mins  Nestor Lewandowsky, OTR/L Acute Rehabilitation Services Pager: [phone] Office: [phone]  [name] 02/05/2021, 3:22 PM

## 2021-02-05 NOTE — Anesthesia Procedure Notes (Addendum)
Procedure Name: MAC Date/Time: 02/05/2021 11:54 AM Performed by: Rande Brunt, CRNA Pre-anesthesia Checklist: Patient identified, Emergency Drugs available, Suction available and Patient being monitored Patient Re-evaluated:Patient Re-evaluated prior to induction Oxygen Delivery Method: Nasal cannula Preoxygenation: Pre-oxygenation with 100% oxygen Induction Type: IV induction Airway Equipment and Method: Bite block Placement Confirmation: positive ETCO2 and CO2 detector Dental Injury: Teeth and Oropharynx as per pre-operative assessment

## 2021-02-05 NOTE — Progress Notes (Signed)
Advanced Heart Failure Rounding Note   Subjective:    Admit weight 325 pounds --> 301 pounds -> 296. Now on po diuretics  Off milrinone. Remains on midodrine for BP support.   Feels weak. Denies SOB, orthopnea or PND. Denies fevers. Lines and Foley out.   Objective:   Weight Range:  Vital Signs:   Temp:  [97.1 F (36.2 C)-98.5 F (36.9 C)] 97.9 F (36.6 C) (10/27 0400) Pulse Rate:  [44-77] 71 (10/27 0800) Resp:  [12-22] 13 (10/27 0800) BP: (85-126)/(53-88) 125/73 (10/27 0800) SpO2:  [92 %-99 %] 99 % (10/27 0800) Weight:  [134.3 kg] 134.3 kg (10/27 0500) Last BM Date: 02/03/21  Weight change: Filed Weights   02/03/21 0500 02/04/21 0500 02/05/21 0500  Weight: (!) 137 kg (!) 136.8 kg 134.3 kg    Intake/Output:   Intake/Output Summary (Last 24 hours) at 02/05/2021 0950 Last data filed at 02/05/2021 0800 Gross per 24 hour  Intake 340.83 ml  Output 1950 ml  Net -1609.17 ml     Physical Exam: General:  Obese male lying in bed. No resp difficulty HEENT: normal Neck: supple. JVP 6-7  Carotids 2+ bilat; no bruits. No lymphadenopathy or thryomegaly appreciated. Cor: PMI nondisplaced. Regular rate & rhythm. No rubs, gallops or murmurs. Lungs: clear Abdomen: obese soft, nontender, nondistended. No hepatosplenomegaly. No bruits or masses. Good bowel sounds. Extremities: no cyanosis, clubbing, rash, tr edema Neuro: alert & orientedx3, cranial nerves grossly intact. moves all 4 extremities w/o difficulty. Affect pleasant    Telemetry: SR with frequent PVCs 80s   Labs: Basic Metabolic Panel: Recent Labs  Lab 01/31/21 0500 01/31/21 1745 02/01/21 0515 02/02/21 0510 02/03/21 0517 02/04/21 0435 02/05/21 0418  NA 124*   < > 129* 130* 127* 127* 127*  K 3.8   < > 4.1 4.5 3.7 4.4 4.6  CL 81*   < > 82* 83* 82* 85* 90*  CO2 33*   < > 37* 39* 37* 33* 29  GLUCOSE 119*   < > 123* 114* 127* 100* 92  BUN 76*   < > 71* 63* 54* 47* 40*  CREATININE 1.65*   < > 1.19 1.06  1.06 1.01 1.05  CALCIUM 7.9*   < > 8.5* 8.4* 8.3* 8.2* 8.7*  MG 2.2  --  2.1 2.2 2.0  --  2.2  PHOS 3.2  --  2.6 2.8 3.2  --  3.6   < > = values in this interval not displayed.     Liver Function Tests: Recent Labs  Lab 02/01/21 0515 02/02/21 0510 02/03/21 0517 02/04/21 0435 02/05/21 0418  AST 24 29 28 29  33  ALT 27 26 25 20 17   ALKPHOS 75 71 83 84 93  BILITOT 1.4* 1.0 1.0 0.9 0.9  PROT 5.8* 5.8* 5.7* 5.7* 6.0*  ALBUMIN 2.3* 2.2* 2.1* 2.1* 2.4*    No results for input(s): LIPASE, AMYLASE in the last 168 hours. Recent Labs  Lab 01/31/21 1106  AMMONIA 19     CBC: Recent Labs  Lab 01/31/21 0500 01/31/21 1745 02/01/21 0515 02/02/21 0510 02/03/21 0517 02/05/21 0418  WBC 13.7*  --  12.5* 11.5* 13.5* 13.3*  HGB 13.1 14.3 13.1 13.4 13.0 13.5  HCT 38.6* 42.0 39.3 39.5 38.5* 41.1  MCV 84.6  --  85.1 85.7 85.0 85.4  PLT 132*  --  153 177 209 277     Cardiac Enzymes: No results for input(s): CKTOTAL, CKMB, CKMBINDEX, TROPONINI in the last 168 hours.  BNP:  BNP (last 3 results) Recent Labs    01/19/21 1237 01/27/21 0447 01/29/21 2020  BNP 942.4* 681.0* 333.2*     ProBNP (last 3 results) No results for input(s): PROBNP in the last 8760 hours.    Other results:  Imaging: DG CHEST PORT 1 VIEW  Result Date: 02/03/2021 CLINICAL DATA:  Central line placement EXAM: PORTABLE CHEST 1 VIEW COMPARISON:  01/29/2021 FINDINGS: Right central line in place with the tip in the SVC. No pneumothorax. Cardiomegaly with vascular congestion. Left lower lobe consolidation with probable left effusion. No confluent opacity on the right. IMPRESSION: Right central line tip in the SVC.  No pneumothorax. Cardiomegaly, vascular congestion. Left lower lobe airspace opacity with left effusion. Cannot exclude pneumonia. Electronically Signed   By: Rolm Baptise M.D.   On: 02/03/2021 19:06     Medications:     Scheduled Medications:  amiodarone  200 mg Oral BID   Chlorhexidine  Gluconate Cloth  6 each Topical Daily   heparin  5,000 Units Subcutaneous Q8H   insulin aspart  0-15 Units Subcutaneous TID WC   levothyroxine  50 mcg Oral Q0600   mexiletine  150 mg Oral Q12H   midodrine  10 mg Oral TID WC   polyethylene glycol  17 g Oral BID   pravastatin  10 mg Oral q1800   senna-docusate  1 tablet Oral BID   torsemide  20 mg Oral Daily    Infusions:   ceFAZolin (ANCEF) IV 2 g (02/05/21 0547)    PRN Medications: acetaminophen, docusate sodium, ondansetron (ZOFRAN) IV, polyethylene glycol   Assessment/Plan:   1. Shock - Cardiogenic/Septic  - Initial presentation consistent with cardiogenic shock but now with septic overlay with PNA and MSSA bacteremia - Procalcitonin 0.8  Lactic Acid 1.2 . RHC cardiac index 1.5  - BCx 2/2 MSSA. Urine Cx negative - Now on Ancef.   -Off milrinone. Continue midodrine   2. Acute Systolic Heart Failure  - Echo EF now down from 55-->20-25%. Cath with no CAD, elevated filling pressures and low output heart failure.  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  - Continues with high PVC burden despite IV amio. Susepct PVC induced cardiomyopathy. Continue amio to suppress burden. Mexilitene added 10/26  - Off milrinone. Central lines out so no co-ox or CVP - Volume status ok. Continue po torsemide -- No room for GDMT with hypotension. Remains on midodrine 10 mg TID   3. Acute Hypoxic/Hypercarbia Respiratory Failure  - Improved with volume removal. Continue to wean oxygen as needed.  - Sats stable on 5 liters [place]. Continue wean as able.    4. NSVT, PVC  -High PVC burden On amio drip. PVCs burden reduced. Continue amio. Mexilitene added 10/26   Keep K> 4.0 Mg>2.0 - outpatient sleep study  5. MSSA bacteremia - likely respiratory source - now on ancef. ID following. Will need line holiday before placing Home IV access - TEE scheduled today at 1130a    6. AKI  - Creatinine peaked at 2.1 , now normalized    7. Hyponatremia - Sodium stable at 127  - Restrict free water  8. Morbid Obesity   Body mass index is 42.48 kg/m.  9.  AMS - ABC with CO2 retention   10. Suspected Sleep Apnea -will need outpatient sleep study  11. Hypothyroidism  Check TSH Start home dose of levothyroixine.    12. Hypokalemia - K 4.6 today  Length of Stay: 7   Glori Bickers MD 02/05/2021, 9:50 AM  Advanced Heart Failure Team Pager [phone] (M-F; 7a - 4p)  Please contact [facility] for night-coverage after hours (4p -7a ) and weekends on [email or web address]

## 2021-02-05 NOTE — Anesthesia Preprocedure Evaluation (Addendum)
Anesthesia Evaluation  Patient identified by MRN, date of birth, ID band Patient awake    Reviewed: Allergy & Precautions, NPO status , Patient's Chart, lab work & pertinent test results  Airway Mallampati: III  TM Distance: >3 FB Neck ROM: Full  Mouth opening: Limited Mouth Opening  Dental  (+) Edentulous Upper, Edentulous Lower, Dental Advisory Given   Pulmonary neg pulmonary ROS, former smoker,    Pulmonary exam normal breath sounds clear to auscultation       Cardiovascular hypertension, Pt. on medications +CHF  Normal cardiovascular exam Rhythm:Regular Rate:Normal  TTE 2022 1. Left ventricular ejection fraction, by estimation, is 25 to 30%. The  left ventricle has severely decreased function. Left ventricular  endocardial border not optimally defined to evaluate regional wall motion.  Left ventricular diastolic parameters are  indeterminate.  2. Right ventricular systolic function was not well visualized. The right  ventricular size is normal. Tricuspid regurgitation signal is inadequate  for assessing PA pressure.  3. The mitral valve was not well visualized. No evidence of mitral valve  regurgitation. No evidence of mitral stenosis.  4. The aortic valve was not well visualized. Aortic valve regurgitation  is not visualized. No aortic stenosis is present.  5. Technically difficult study due to poor echo windows.   Very limited study even with contrast agent. Consider alternative  testing.   Cath 2022 Conclusions: 1. No angiographically significant coronary artery disease. 2. Moderately elevated left heart and pulmonary artery pressures. 3. Severely elevated right heart filling pressure. 4. Severely reduced Fick cardiac output/index.  Recommendations: 1. Transfer to stepdown for initiation of milrinone infusion in the setting of low output heart failure. 2. Decrease carvedilol and losartan with initiation of  milrinone and borderline hypotension. 3. Diurese as renal function tolerates following initiation of milrinone. 4. Primary prevention of coronary artery disease.    Neuro/Psych PSYCHIATRIC DISORDERS Depression negative neurological ROS     GI/Hepatic negative GI ROS, Neg liver ROS,   Endo/Other  Hypothyroidism Morbid obesity (BMI 42)  Renal/GU negative Renal ROSLab Results      Component                Value               Date                      CREATININE               1.05                02/05/2021                BUN                      40 (H)              02/05/2021                NA                       127 (L)             02/05/2021                K                        4.6  02/05/2021                CL                       90 (L)              02/05/2021                CO2                      29                  02/05/2021             negative genitourinary   Musculoskeletal negative musculoskeletal ROS (+)   Abdominal   Peds  Hematology negative hematology ROS (+)   Anesthesia Other Findings Initial presentation consistent with cardiogenic shock but now with septic overlay with PNA and MSSA bacteremia on milrinone and midodrine. Echo EF now down from 55-->20-25%. Cath with no CAD, elevated filling pressures and low output heart failure. Now off milrinone.   Reproductive/Obstetrics                            Anesthesia Physical Anesthesia Plan  ASA: 4  Anesthesia Plan: MAC   Post-op Pain Management:    Induction: Intravenous  PONV Risk Score and Plan: Propofol infusion and Treatment may vary due to age or medical condition  Airway Management Planned: Natural Airway  Additional Equipment:   Intra-op Plan:   Post-operative Plan:   Informed Consent: I have reviewed the patients History and Physical, chart, labs and discussed the procedure including the risks, benefits and alternatives for the proposed anesthesia  with the patient or authorized representative who has indicated his/her understanding and acceptance.     Dental advisory given  Plan Discussed with: CRNA  Anesthesia Plan Comments:         Anesthesia Quick Evaluation

## 2021-02-05 NOTE — Progress Notes (Signed)
PHARMACY CONSULT NOTE FOR:  OUTPATIENT  PARENTERAL ANTIBIOTIC THERAPY (OPAT)  Indication: MSSA Bacteremia Regimen: Cefazolin 2g IV q8h End date: 03/09/21  IV antibiotic discharge orders are pended. To discharging provider:  please sign these orders via discharge navigator,  Select New Orders & click on the button choice - Manage This Unsigned Work.     Thank you for allowing pharmacy to be a part of this patient's care.  Lestine Box, PharmD PGY2 Infectious Diseases Pharmacy Resident   Please check [email or web address] for unit-specific pharmacy phone numbers

## 2021-02-05 NOTE — Anesthesia Postprocedure Evaluation (Signed)
Anesthesia Post Note  Patient: Anthony Weber  Procedure(s) Performed: TRANSESOPHAGEAL ECHOCARDIOGRAM (TEE)     Patient location during evaluation: PACU Anesthesia Type: MAC Level of consciousness: awake and alert Pain management: pain level controlled Vital Signs Assessment: post-procedure vital signs reviewed and stable Respiratory status: spontaneous breathing, nonlabored ventilation, respiratory function stable and patient connected to nasal cannula oxygen Cardiovascular status: stable and blood pressure returned to baseline Postop Assessment: no apparent nausea or vomiting Anesthetic complications: no   No notable events documented.  Last Vitals:  Vitals:   02/05/21 1210 02/05/21 1258  BP: (!) 101/52 (!) 98/58  Pulse: (!) 39 64  Resp: 18 15  Temp: 37.2 C 36.6 C  SpO2: 97% 94%    Last Pain:  Vitals:   02/05/21 1258  TempSrc: Oral  PainSc: 0-No pain                 Dejana Pugsley L Ryleah Miramontes

## 2021-02-05 NOTE — Progress Notes (Signed)
PROGRESS NOTE    Anthony Weber  WUJ:[id number] DOB: Sep 10, 1945 DOA: 01/29/2021 PCP: Albina Billet, MD   Brief Narrative:  75 yo M PMH morbid obesity, HTN, HLD, hypothyroidism, HFrEF who presented to [facility] ED 10/9 with CC BLE swelling. He reportedly gained 15lb over 1wk prior to presentation, had incr DOE. He was admitted to hospitalist service for acute heart failure exacerbation. Cardiology was consulted 10/10 and recommended diuresis, lisonopril and repeat ECHO which revealed LVEF 25-30% with indeterminate LV diastolic parameters and poorly visualized RV systolic function.  He received ongoing medication optimization and went for [facility] 10/18 which showed no significant CAD, moderately elevated L heart and PA pressure, severely elevated R Heart pressures. He was transferred to the ICU 10/18 and started on a milrinone infusion for cardiogenic shock.   On 10/20 the pt was started on a lasix gtt for hypoxia in setting of cardiogenic shock and a plan was made to transfer the patient to [facility] [place] [facility] for further management and care.  He was admitted to ICU and eventually was transitioned to [facility] care on 02/05/2021.  Significant Hospital Events: Including procedures, antibiotic start and stop dates in addition to other pertinent events   10/9 admitted to [facility] with likely HF exacerbation. Diuresed  10/10 cards consulted for HF recs  10/18 R/LHC without significant CAD, but had incr L heart, PA, and R heart pressures. Transferred to ICU, started on milronone> developed hypotension and NSVT> milrinone reduced and started norepi and amiodarone.  10/20 Transferred to [facility] 10/21 MSSA in blood cultures, A-line placed and removed overnight 10/24 pressors weaned off.  Transferred under [place] again on 02/05/2021    Assessment & Plan:   Principal Problem:   MSSA bacteremia Active Problems:   Cardiogenic shock ([place])   Central line infection   Pneumonia of both lower lobes due to methicillin  susceptible Staphylococcus aureus (MSSA) (HCC)   Hyponatremia   Acute on chronic combined systolic and diastolic congestive heart failure (HCC)   Acute on chronic respiratory failure with hypoxia and hypercapnia (HCC)  Shock - Cardiogenic/Septic/MSSA bacteremia - Initial presentation consistent with cardiogenic shock but now with septic overlay with PNA and MSSA bacteremia - Procalcitonin 0.8  Lactic Acid 1.2 . RHC cardiac index 1.5  - BCx on 01/30/2021, 2/2 MSSA. Urine Cx negative, A-line placed and removed overnight.  Blood cultures since then have been negative.  Once again central line removed on 02/04/2021 and blood cultures drawn.  ID managing antibiotics.  Currently on Ancef.  TEE scheduled today.  -Off milrinone. Continue midodrine.  Cardiology/heart failure team managing cardiogenic shock.   Acute Systolic Heart Failure  - Echo EF now down from 55-->20-25%. Cath with no CAD, elevated filling pressures and low output heart failure.  - Off milrinone. Volume status ok. Continue po torsemide -- No room for GDMT with hypotension. Remains on midodrine 10 mg TID   Acute Hypoxic/Hypercarbia Respiratory Failure  - Improved with volume removal. Continue to wean oxygen as needed.  - Sats stable on 5 liters [place]. Continue wean as able.  Encouraged incentive spirometry.   NSVT, PVC  -High PVC burden On amio drip. PVCs burden reduced. Continue amio. Mexilitene added 10/26   Keep K> 4.0 Mg>2.0 - outpatient sleep study   AKI  - Creatinine peaked at 2.1 , now resolved.   Hyponatremia - Sodium stable at 127  - Restrict free water   Morbid Obesity   Body mass index is 42.48 kg/m.   AMS - ABC  with CO2 retention, resolved, currently fully alert and oriented.   Suspected Sleep Apnea -will need outpatient sleep study.  Continue BiPAP at night and as needed BiPAP during daytime.   Hypothyroidism  Continue Synthroid.   Hypokalemia Resolved  DVT prophylaxis: Place TED hose Start:  02/03/21 0905 heparin injection 5,000 Units Start: 01/29/21 2200 SCDs Start: 01/29/21 2001   Code Status: Full Code  Family Communication: Wife and son present at bedside.  Plan of care discussed with patient in length and he verbalized understanding and agreed with it.  Status is: Inpatient  Remains inpatient appropriate because: Needs further work-up.  Estimated body mass index is 42.48 kg/m as calculated from the following:   Height as of this encounter: 5\' 10"  (1.778 m).   Weight as of this encounter: 134.3 kg.     Nutritional Assessment: Body mass index is 42.48 kg/m.Marland Kitchen Seen by dietician.  I agree with the assessment and plan as outlined below: Nutrition Status:  Skin Assessment: I have examined the patient's skin and I agree with the wound assessment as performed by the wound care RN as outlined below:    Consultants:  Cardiology and ID  Procedures:  As above  Antimicrobials:  Anti-infectives (From admission, onward)    Start     Dose/Rate Route Frequency Ordered Stop   02/02/21 1615  [MAR Hold]  ceFAZolin (ANCEF) IVPB 2g/100 mL premix        (MAR Hold since Thu 02/05/2021 at 1102.Hold Reason: Transfer to a Procedural area)   2 g 200 mL/hr over 30 Minutes Intravenous Every 8 hours 02/02/21 1515     02/01/21 0845  ceFEPIme (MAXIPIME) 2 g in sodium chloride 0.9 % 100 mL IVPB  Status:  Discontinued        2 g 200 mL/hr over 30 Minutes Intravenous Every 8 hours 02/01/21 0756 02/02/21 1515   01/30/21 1015  ceFAZolin (ANCEF) IVPB 2g/100 mL premix  Status:  Discontinued        2 g 200 mL/hr over 30 Minutes Intravenous Every 8 hours 01/30/21 0927 01/30/21 0930   01/29/21 2115  vancomycin (VANCOREADY) IVPB 1250 mg/250 mL  Status:  Discontinued        1,250 mg 166.7 mL/hr over 90 Minutes Intravenous Every 24 hours 01/29/21 2029 01/31/21 1048   01/29/21 2100  ceFEPIme (MAXIPIME) 2 g in sodium chloride 0.9 % 100 mL IVPB  Status:  Discontinued        2 g 200 mL/hr over 30  Minutes Intravenous Every 12 hours 01/29/21 2004 02/01/21 0756          Subjective: Patient seen and examined.  Wife and son at the bedside.  Patient feels well.  Fully alert and oriented.  Denies any shortness of breath, chest pain or any other complaint.  Objective: Vitals:   02/05/21 0700 02/05/21 0800 02/05/21 0900 02/05/21 1109  BP: (!) 85/56 125/73 (!) 108/59 (!) 111/59  Pulse: 67 71 61 69  Resp: 15 13 18 12   Temp:    (!) 97.1 F (36.2 C)  TempSrc:    Temporal  SpO2: 93% 99% 95% 98%  Weight:    134.3 kg  Height:    5\' 10"  (1.778 m)    Intake/Output Summary (Last 24 hours) at 02/05/2021 1135 Last data filed at 02/05/2021 0800 Gross per 24 hour  Intake 320.01 ml  Output 1950 ml  Net -1629.99 ml   Filed Weights   02/04/21 0500 02/05/21 0500 02/05/21 1109  Weight: (!) 136.8  kg 134.3 kg 134.3 kg    Examination:  General exam: Appears calm and comfortable, obese Respiratory system: Slightly diminished breath sounds due to poor inspiratory effort.  Cardiovascular system: S1 & S2 heard, RRR. No JVD, , rubs, gallops or clicks. No pedal edema. Gastrointestinal system: Abdomen is nondistended, soft and nontender. No organomegaly or masses felt. Normal bowel sounds heard. Central nervous system: Alert and oriented. No focal neurological deficits. Extremities: Symmetric 5 x 5 power. Skin: No rashes, lesions or ulcers Psychiatry: Judgement and insight appear normal. Mood & affect appropriate.    Data Reviewed: I have personally reviewed following labs and imaging studies  CBC: Recent Labs  Lab 01/31/21 0500 01/31/21 1745 02/01/21 0515 02/02/21 0510 02/03/21 0517 02/05/21 0418  WBC 13.7*  --  12.5* 11.5* 13.5* 13.3*  HGB 13.1 14.3 13.1 13.4 13.0 13.5  HCT 38.6* 42.0 39.3 39.5 38.5* 41.1  MCV 84.6  --  85.1 85.7 85.0 85.4  PLT 132*  --  153 177 209 656   Basic Metabolic Panel: Recent Labs  Lab 01/31/21 0500 01/31/21 1745 02/01/21 0515 02/02/21 0510  02/03/21 0517 02/04/21 0435 02/05/21 0418  NA 124*   < > 129* 130* 127* 127* 127*  K 3.8   < > 4.1 4.5 3.7 4.4 4.6  CL 81*   < > 82* 83* 82* 85* 90*  CO2 33*   < > 37* 39* 37* 33* 29  GLUCOSE 119*   < > 123* 114* 127* 100* 92  BUN 76*   < > 71* 63* 54* 47* 40*  CREATININE 1.65*   < > 1.19 1.06 1.06 1.01 1.05  CALCIUM 7.9*   < > 8.5* 8.4* 8.3* 8.2* 8.7*  MG 2.2  --  2.1 2.2 2.0  --  2.2  PHOS 3.2  --  2.6 2.8 3.2  --  3.6   < > = values in this interval not displayed.   GFR: Estimated Creatinine Clearance: 82.5 mL/min (by C-G formula based on SCr of 1.05 mg/dL). Liver Function Tests: Recent Labs  Lab 02/01/21 0515 02/02/21 0510 02/03/21 0517 02/04/21 0435 02/05/21 0418  AST 24 29 28 29  33  ALT 27 26 25 20 17   ALKPHOS 75 71 83 84 93  BILITOT 1.4* 1.0 1.0 0.9 0.9  PROT 5.8* 5.8* 5.7* 5.7* 6.0*  ALBUMIN 2.3* 2.2* 2.1* 2.1* 2.4*   No results for input(s): LIPASE, AMYLASE in the last 168 hours. Recent Labs  Lab 01/31/21 1106  AMMONIA 19   Coagulation Profile: No results for input(s): INR, PROTIME in the last 168 hours. Cardiac Enzymes: No results for input(s): CKTOTAL, CKMB, CKMBINDEX, TROPONINI in the last 168 hours. BNP (last 3 results) No results for input(s): PROBNP in the last 8760 hours. HbA1C: No results for input(s): HGBA1C in the last 72 hours. CBG: Recent Labs  Lab 02/04/21 0658 02/04/21 1112 02/04/21 1632 02/04/21 2104 02/05/21 0626  GLUCAP 103* 114* 135* 95 109*   Lipid Profile: No results for input(s): CHOL, HDL, LDLCALC, TRIG, CHOLHDL, LDLDIRECT in the last 72 hours. Thyroid Function Tests: No results for input(s): TSH, T4TOTAL, FREET4, T3FREE, THYROIDAB in the last 72 hours. Anemia Panel: No results for input(s): VITAMINB12, FOLATE, FERRITIN, TIBC, IRON, RETICCTPCT in the last 72 hours. Sepsis Labs: Recent Labs  Lab 01/29/21 2020  PROCALCITON 0.80  LATICACIDVEN 1.2    Recent Results (from the past 240 hour(s))  CULTURE, BLOOD (ROUTINE X  2) w Reflex to ID Panel     Status: Abnormal  Collection Time: 01/29/21  2:00 PM   Specimen: BLOOD  Result Value Ref Range Status   Specimen Description   Final    BLOOD LEFT ANTECUBITAL Performed at [facility], [address] [place], [place] [address]    Special Requests   Final    BOTTLES DRAWN AEROBIC AND ANAEROBIC Blood Culture adequate volume Performed at [facility], [address] [place], [place] [address]    Culture  Setup Time   Final    IN BOTH AEROBIC AND ANAEROBIC BOTTLES GRAM POSITIVE COCCI CRITICAL RESULT CALLED TO, READ BACK BY AND VERIFIED WITH: GREGG ABBOTT @0345  ON 01/30/21 SKL Performed at [facility], [place] [address], [place], [place] [address]    Culture STAPHYLOCOCCUS AUREUS (A)  Final   Report Status 02/01/2021 FINAL  Final   Organism ID, Bacteria STAPHYLOCOCCUS AUREUS  Final      Susceptibility   Staphylococcus aureus - MIC*    CIPROFLOXACIN <=0.5 SENSITIVE Sensitive     ERYTHROMYCIN >=8 RESISTANT Resistant     GENTAMICIN <=0.5 SENSITIVE Sensitive     OXACILLIN <=0.25 SENSITIVE Sensitive     TETRACYCLINE <=1 SENSITIVE Sensitive     VANCOMYCIN <=0.5 SENSITIVE Sensitive     TRIMETH/SULFA <=10 SENSITIVE Sensitive     CLINDAMYCIN RESISTANT Resistant     RIFAMPIN <=0.5 SENSITIVE Sensitive     Inducible Clindamycin POSITIVE Resistant     * STAPHYLOCOCCUS AUREUS  Blood Culture ID Panel (Reflexed)     Status: Abnormal   Collection Time: 01/29/21  2:00 PM  Result Value Ref Range Status   Enterococcus faecalis NOT DETECTED NOT DETECTED Final   Enterococcus Faecium NOT DETECTED NOT DETECTED Final   Listeria monocytogenes NOT DETECTED NOT DETECTED Final   Staphylococcus species DETECTED (A) NOT DETECTED Final    Comment: CRITICAL RESULT CALLED TO, READ BACK BY AND VERIFIED WITH: GREGG ABBOTT @0345  ON 01/30/21 SKL    Staphylococcus aureus (BCID) DETECTED (A) NOT DETECTED Final    Comment: CRITICAL RESULT CALLED TO, READ BACK  BY AND VERIFIED WITH: GREGG ABBOTT @0345  ON 01/30/21 SKL    Staphylococcus epidermidis NOT DETECTED NOT DETECTED Final   Staphylococcus lugdunensis NOT DETECTED NOT DETECTED Final   Streptococcus species NOT DETECTED NOT DETECTED Final   Streptococcus agalactiae NOT DETECTED NOT DETECTED Final   Streptococcus pneumoniae NOT DETECTED NOT DETECTED Final   Streptococcus pyogenes NOT DETECTED NOT DETECTED Final   A.calcoaceticus-baumannii NOT DETECTED NOT DETECTED Final   Bacteroides fragilis NOT DETECTED NOT DETECTED Final   Enterobacterales NOT DETECTED NOT DETECTED Final   Enterobacter cloacae complex NOT DETECTED NOT DETECTED Final   Escherichia coli NOT DETECTED NOT DETECTED Final   Klebsiella aerogenes NOT DETECTED NOT DETECTED Final   Klebsiella oxytoca NOT DETECTED NOT DETECTED Final   Klebsiella pneumoniae NOT DETECTED NOT DETECTED Final   Proteus species NOT DETECTED NOT DETECTED Final   Salmonella species NOT DETECTED NOT DETECTED Final   Serratia marcescens NOT DETECTED NOT DETECTED Final   Haemophilus influenzae NOT DETECTED NOT DETECTED Final   Neisseria meningitidis NOT DETECTED NOT DETECTED Final   Pseudomonas aeruginosa NOT DETECTED NOT DETECTED Final   Stenotrophomonas maltophilia NOT DETECTED NOT DETECTED Final   Candida albicans NOT DETECTED NOT DETECTED Final   Candida auris NOT DETECTED NOT DETECTED Final   Candida glabrata NOT DETECTED NOT DETECTED Final   Candida krusei NOT DETECTED NOT DETECTED Final   Candida parapsilosis NOT DETECTED NOT DETECTED Final   Candida tropicalis NOT  DETECTED NOT DETECTED Final   Cryptococcus neoformans/gattii NOT DETECTED NOT DETECTED Final   Meth resistant mecA/C and MREJ NOT DETECTED NOT DETECTED Final    Comment: Performed at [facility], [address] [place], [place] [address]  CULTURE, BLOOD (ROUTINE X 2) w Reflex to ID Panel     Status: Abnormal   Collection Time: 01/29/21  2:03 PM   Specimen: BLOOD  Result  Value Ref Range Status   Specimen Description   Final    BLOOD BLOOD LEFT HAND Performed at [facility], [address], [place], [place] [address]    Special Requests   Final    BOTTLES DRAWN AEROBIC AND ANAEROBIC Blood Culture adequate volume Performed at [facility], [address] [place], [place] [address]    Culture  Setup Time   Final    IN BOTH AEROBIC AND ANAEROBIC BOTTLES GRAM POSITIVE COCCI CRITICAL VALUE NOTED.  VALUE IS CONSISTENT WITH PREVIOUSLY REPORTED AND CALLED VALUE. Performed at [facility], [address] [place], [place] [address]    Culture (A)  Final    STAPHYLOCOCCUS AUREUS SUSCEPTIBILITIES PERFORMED ON PREVIOUS CULTURE WITHIN THE LAST 5 DAYS. Performed at [facility], [place] [address] [place], [place] [address]    Report Status 02/01/2021 FINAL  Final  Urine Culture     Status: None   Collection Time: 01/29/21  8:38 PM   Specimen: Urine, Catheterized  Result Value Ref Range Status   Specimen Description URINE, CATHETERIZED  Final   Special Requests NONE  Final   Culture   Final    NO GROWTH Performed at [facility], [place] [address], [place], [place] [address]    Report Status 01/31/2021 FINAL  Final  Expectorated Sputum Assessment w Gram Stain, Rflx to Resp Cult     Status: None   Collection Time: 01/29/21  8:38 PM   Specimen: Sputum  Result Value Ref Range Status   Specimen Description SPUTUM  Final   Special Requests NONE  Final   Sputum evaluation   Final    Sputum specimen not acceptable for testing.  Please recollect.   RESULT CALLED TO, READ BACK BY AND VERIFIED WITH: RN KOBE 01/30/21@00 :36 Performed at [facility] [address] [place], [place] [address]    Report Status 01/31/2021 FINAL  Final  Expectorated Sputum Assessment w Gram Stain, Rflx to Resp Cult     Status: None   Collection Time: 01/30/21  9:04 AM   Specimen: Sputum  Result Value Ref Range Status   Specimen Description  SPUTUM  Final   Special Requests  EXPECTORATED  Final   Sputum evaluation   Final    THIS SPECIMEN IS ACCEPTABLE FOR SPUTUM CULTURE Performed at [facility], [place] [address], [place],  [address]    Report Status 01/30/2021 FINAL  Final  Culture, Respiratory w Gram Stain     Status: None   Collection Time: 01/30/21  9:04 AM   Specimen: SPU  Result Value Ref Range Status   Specimen Description SPUTUM  Final   Special Requests  EXPECTORATED Reflexed from [id number]  Final   Gram Stain   Final    FEW SQUAMOUS EPITHELIAL CELLS PRESENT FEW WBC SEEN FEW GRAM POSITIVE COCCI    Culture   Final    FEW Consistent with normal respiratory flora. No Pseudomonas species isolated Performed at [place] [address], [place],  [address]  Report Status 02/02/2021 FINAL  Final  Culture, blood (routine x 2)     Status: None   Collection Time: 01/30/21 10:08 AM   Specimen: BLOOD RIGHT HAND  Result Value Ref Range Status   Specimen Description BLOOD RIGHT HAND  Final   Special Requests   Final    BOTTLES DRAWN AEROBIC AND ANAEROBIC Blood Culture adequate volume   Culture   Final    NO GROWTH 5 DAYS Performed at [facility], [address] [place], [place] [address]    Report Status 02/04/2021 FINAL  Final  Culture, blood (routine x 2)     Status: None   Collection Time: 01/30/21 10:09 AM   Specimen: BLOOD LEFT HAND  Result Value Ref Range Status   Specimen Description BLOOD LEFT HAND  Final   Special Requests   Final    BOTTLES DRAWN AEROBIC AND ANAEROBIC Blood Culture adequate volume   Culture   Final    NO GROWTH 5 DAYS Performed at [facility], [place] [address] [place], [place] [address]    Report Status 02/04/2021 FINAL  Final  MRSA Next Gen by PCR, Nasal     Status: None   Collection Time: 01/30/21 11:01 AM   Specimen: Nasal Mucosa; Nasal Swab  Result Value Ref Range Status   MRSA by PCR Next Gen NOT DETECTED NOT DETECTED Final     Comment: (NOTE) The GeneXpert MRSA Assay (FDA approved for NASAL specimens only), is one component of a comprehensive MRSA colonization surveillance program. It is not intended to diagnose MRSA infection nor to guide or monitor treatment for MRSA infections. Test performance is not FDA approved in patients less than 88 years old. Performed at [facility], [place] [address], [place], [place] [address]   Culture, blood (routine x 2)     Status: None   Collection Time: 01/31/21  6:08 AM   Specimen: BLOOD RIGHT HAND  Result Value Ref Range Status   Specimen Description BLOOD RIGHT HAND  Final   Special Requests   Final    AEROBIC BOTTLE ONLY Blood Culture results may not be optimal due to an inadequate volume of blood received in culture bottles   Culture   Final    NO GROWTH 5 DAYS Performed at [facility], [place] [address], [place], [place] [address]    Report Status 02/05/2021 FINAL  Final  Culture, blood (routine x 2)     Status: None   Collection Time: 01/31/21  3:06 PM   Specimen: BLOOD  Result Value Ref Range Status   Specimen Description BLOOD SITE NOT SPECIFIED  Final   Special Requests   Final    BOTTLES DRAWN AEROBIC ONLY Blood Culture results may not be optimal due to an inadequate volume of blood received in culture bottles   Culture   Final    NO GROWTH 5 DAYS Performed at [facility], [place] [address], [place], [place] [address]    Report Status 02/05/2021 FINAL  Final  Culture, blood (routine x 2)     Status: None (Preliminary result)   Collection Time: 02/04/21 12:56 PM   Specimen: BLOOD  Result Value Ref Range Status   Specimen Description BLOOD LEFT ANTECUBITAL  Final   Special Requests   Final    BOTTLES DRAWN AEROBIC AND ANAEROBIC Blood Culture adequate volume   Culture   Final    NO GROWTH < 24 HOURS Performed at [facility], [place] [address], [place], [place] [address]    Report Status PENDING  Incomplete  Culture, blood (routine x 2)      Status: None (Preliminary result)   Collection Time: 02/04/21  1:03 PM   Specimen: BLOOD  Result Value Ref Range Status   Specimen Description BLOOD LEFT ANTECUBITAL  Final   Special Requests   Final    BOTTLES DRAWN AEROBIC AND ANAEROBIC Blood Culture adequate volume   Culture   Final    NO GROWTH < 24 HOURS Performed at [facility], [place] [address], [place], [place] [address]    Report Status PENDING  Incomplete      Radiology Studies: DG CHEST PORT 1 VIEW  Result Date: 02/03/2021 CLINICAL DATA:  Central line placement EXAM: PORTABLE CHEST 1 VIEW COMPARISON:  01/29/2021 FINDINGS: Right central line in place with the tip in the SVC. No pneumothorax. Cardiomegaly with vascular congestion. Left lower lobe consolidation with probable left effusion. No confluent opacity on the right. IMPRESSION: Right central line tip in the SVC.  No pneumothorax. Cardiomegaly, vascular congestion. Left lower lobe airspace opacity with left effusion. Cannot exclude pneumonia. Electronically Signed   By: Rolm Baptise M.D.   On: 02/03/2021 19:06    Scheduled Meds:  [MAR Hold] amiodarone  200 mg Oral BID   [MAR Hold] Chlorhexidine Gluconate Cloth  6 each Topical Daily   [MAR Hold] heparin  5,000 Units Subcutaneous Q8H   [MAR Hold] insulin aspart  0-15 Units Subcutaneous TID WC   [MAR Hold] levothyroxine  50 mcg Oral Q0600   [MAR Hold] mexiletine  150 mg Oral Q12H   [MAR Hold] midodrine  10 mg Oral TID WC   [MAR Hold] polyethylene glycol  17 g Oral BID   [MAR Hold] pravastatin  10 mg Oral q1800   [MAR Hold] senna-docusate  1 tablet Oral BID   [MAR Hold] torsemide  20 mg Oral Daily   Continuous Infusions:  [MAR Hold]  ceFAZolin (ANCEF) IV 2 g (02/05/21 0547)     LOS: 7 days   Time spent: 36 minutes   Darliss Cheney, MD [facility]  02/05/2021, 11:35 AM  Please page via [place] [place] and do not message via secure chat for anything urgent. Secure chat can be used for anything non  urgent.  How to contact the [facility] Attending or Consulting provider [place] or covering provider during after hours [place], for this patient?  Check the care team in [facility] and look for a) attending/consulting [facility] provider listed and b) the [facility] team listed. Page or secure chat [phone]. Log into [email or web address] and use [place]'s universal password to access. If you do not have the password, please contact the hospital operator. Locate the [facility] provider you are looking for under [facility] and page to a number that you can be directly reached. If you still have difficulty reaching the provider, please page the [facility] (Director on Call) for the Hospitalists listed on amion for assistance.

## 2021-02-06 LAB — GLUCOSE, CAPILLARY
Glucose-Capillary: 109 mg/dL — ABNORMAL HIGH (ref 70–99)
Glucose-Capillary: 88 mg/dL (ref 70–99)
Glucose-Capillary: 216 mg/dL — ABNORMAL HIGH (ref 70–99)
Glucose-Capillary: 76 mg/dL (ref 70–99)
Glucose-Capillary: 109 mg/dL — ABNORMAL HIGH (ref 70–99)

## 2021-02-06 LAB — BASIC METABOLIC PANEL
Anion gap: 5 (ref 5–15)
BUN: 38 mg/dL — ABNORMAL HIGH (ref 8–23)
CO2: 29 mmol/L (ref 22–32)
Calcium: 8.5 mg/dL — ABNORMAL LOW (ref 8.9–10.3)
Chloride: 94 mmol/L — ABNORMAL LOW (ref 98–111)
Creatinine, Ser: 0.89 mg/dL (ref 0.61–1.24)
GFR, Estimated: >60 mL/min (ref >60)
Glucose, Bld: 101 mg/dL — ABNORMAL HIGH (ref 70–99)
Potassium: 4.3 mmol/L (ref 3.5–5.1)
Sodium: 128 mmol/L — ABNORMAL LOW (ref 135–145)

## 2021-02-06 LAB — CBC WITH DIFFERENTIAL/PLATELET
Abs Immature Granulocytes: 0.23 10*3/uL — ABNORMAL HIGH (ref 0.00–0.07)
Basophils Absolute: 0.1 10*3/uL (ref 0.0–0.1)
Basophils Relative: 1 %
Eosinophils Absolute: 0.5 10*3/uL (ref 0.0–0.5)
Eosinophils Relative: 5 %
HCT: 39.8 % (ref 39.0–52.0)
Hemoglobin: 13.3 g/dL (ref 13.0–17.0)
Immature Granulocytes: 2 %
Lymphocytes Relative: 6 %
Lymphs Abs: 0.7 10*3/uL (ref 0.7–4.0)
MCH: 28.6 pg (ref 26.0–34.0)
MCHC: 33.4 g/dL (ref 30.0–36.0)
MCV: 85.6 fL (ref 80.0–100.0)
Monocytes Absolute: 1.5 10*3/uL — ABNORMAL HIGH (ref 0.1–1.0)
Monocytes Relative: 14 %
Neutro Abs: 8 10*3/uL — ABNORMAL HIGH (ref 1.7–7.7)
Neutrophils Relative %: 72 %
Platelets: 278 10*3/uL (ref 150–400)
RBC: 4.65 MIL/uL (ref 4.22–5.81)
RDW: 15.4 % (ref 11.5–15.5)
WBC: 11 10*3/uL — ABNORMAL HIGH (ref 4.0–10.5)
nRBC: 0 % (ref 0.0–0.2)

## 2021-02-06 LAB — MAGNESIUM: Magnesium: 2.3 mg/dL (ref 1.7–2.4)

## 2021-02-06 MED ORDER — TORSEMIDE 20 MG PO TABS
40.0000 mg | ORAL_TABLET | Freq: Every day | ORAL | Status: DC
  Administered 2021-02-07 – 2021-02-11 (×5): 40 mg via ORAL
  Filled 2021-02-06 (×5): qty 2

## 2021-02-06 MED ORDER — TORSEMIDE 20 MG PO TABS: 40.0000 mg | ORAL_TABLET | Freq: Every day | ORAL | Status: DC

## 2021-02-06 NOTE — Progress Notes (Signed)
Diagnosis: Mssa bacteremia; negative tee; no other metastatic focus of infection; improving on cefazolin  10/20 bcx mssa 10/21, 22, and 26 bcx negative   Plan: -order placed for midline -follow up as below -id will sign off  OPAT Orders Discharge antibiotics to be given via midline Discharge antibiotics: cefazolin Duration: 4 weeks End Date: 11/28  [facility] Care Per Protocol:  Home health RN for IV administration and teaching; PICC line care and labs.    Labs weekly while on IV antibiotics: _x_ CBC with differential __ BMP _x_ CMP _x_ CRP __ ESR __ Vancomycin trough __ CK  _x_ Please pull midline at completion of IV antibiotics __ Please leave PIC in place until doctor has seen patient or been notified  Fax weekly labs to [phone]  Clinic Follow Up Appt: 12/14 @ 230 with dr Tommy Medal  @  [facility] [place], [place], [place] [address] Phone: [phone]  ------------------ Subjective: Afebrile No n/v/diarrhea Eating well Asking about leaving/disposition Oob with fww/pt-ot  ROS: All other ros negative  Meds: Reviewed  Exam: Vitals:   02/06/21 0757 02/06/21 1120  BP: (!) 82/47 (!) 103/53  Pulse: 68 71  Resp: 14 16  Temp: 97.9 F (36.6 C) (!) 97 F (36.1 C)  SpO2: 97% 95%   General/constitutional: no distress, pleasant; on 1.5 liters oxygen via [place] HEENT: Normocephalic, PER, Conj Clear, EOMI, Oropharynx clear Neck supple CV: rrr no mrg Lungs: clear to auscultation, normal respiratory effort Abd: Soft, Nontender Ext: no edema Skin: No Rash Neuro: nonfocal MSK: no peripheral joint swelling/tenderness/warmth; back spines nontender  Labs/imaging reviewed: Lab Results  Component Value Date   WBC 11.0 (H) 02/06/2021   HGB 13.3 02/06/2021   HCT 39.8 02/06/2021   MCV 85.6 02/06/2021   PLT 278 45/80/9983   Last metabolic panel Lab Results  Component Value Date   GLUCOSE 101 (H) 02/06/2021   NA 128 (L) 02/06/2021   K 4.3  02/06/2021   CL 94 (L) 02/06/2021   CO2 29 02/06/2021   BUN 38 (H) 02/06/2021   CREATININE 0.89 02/06/2021   GFRNONAA >60 02/06/2021   CALCIUM 8.5 (L) 02/06/2021   PHOS 3.6 02/05/2021   PROT 6.0 (L) 02/05/2021   ALBUMIN 2.4 (L) 02/05/2021   BILITOT 0.9 02/05/2021   ALKPHOS 93 02/05/2021   AST 33 02/05/2021   ALT 17 02/05/2021   ANIONGAP 5 02/06/2021

## 2021-02-06 NOTE — Progress Notes (Addendum)
Advanced Heart Failure Rounding Note   Subjective:    Admit weight 325 pounds --> today 296.   TEE negative for endocarditis.   Denies SOB. Wants to know when he can go to rehab   Objective:   Weight Range:  Vital Signs:   Temp:  [96.5 F (35.8 C)-98.9 F (37.2 C)] 97.9 F (36.6 C) (10/28 0757) Pulse Rate:  [39-69] 68 (10/28 0757) Resp:  [14-18] 14 (10/28 0757) BP: (82-126)/(47-61) 82/47 (10/28 0757) SpO2:  [94 %-98 %] 97 % (10/28 0757) Weight:  [134.3 kg] 134.3 kg (10/28 0342) Last BM Date: 02/05/21  Weight change: Filed Weights   02/05/21 0500 02/05/21 1109 02/06/21 0342  Weight: 134.3 kg 134.3 kg 134.3 kg    Intake/Output:   Intake/Output Summary (Last 24 hours) at 02/06/2021 1123 Last data filed at 02/06/2021 1000 Gross per 24 hour  Intake 50 ml  Output 1000 ml  Net -950 ml    Physical Exam: General:   No resp difficulty. In bed.  HEENT: normal Neck: supple. no JVD. Carotids 2+ bilat; no bruits. No lymphadenopathy or thryomegaly appreciated. Cor: PMI nondisplaced. Regular rate & rhythm. No rubs, gallops or murmurs. Lungs: clear on 2 liters  Abdomen: obese, soft, nontender, nondistended. No hepatosplenomegaly. No bruits or masses. Good bowel sounds. Extremities: no cyanosis, clubbing, rash, edema Neuro: alert & orientedx3, cranial nerves grossly intact. moves all 4 extremities w/o difficulty. Affect pleasant   Telemetry: SR 80s with occasional PVCs.  Labs: Basic Metabolic Panel: Recent Labs  Lab 01/31/21 0500 01/31/21 1745 02/01/21 0515 02/02/21 0510 02/03/21 7169 02/04/21 0435 02/05/21 0418 02/06/21 0219  NA 124*   < > 129* 130* 127* 127* 127* 128*  K 3.8   < > 4.1 4.5 3.7 4.4 4.6 4.3  CL 81*   < > 82* 83* 82* 85* 90* 94*  CO2 33*   < > 37* 39* 37* 33* 29 29  GLUCOSE 119*   < > 123* 114* 127* 100* 92 101*  BUN 76*   < > 71* 63* 54* 47* 40* 38*  CREATININE 1.65*   < > 1.19 1.06 1.06 1.01 1.05 0.89  CALCIUM 7.9*   < > 8.5* 8.4* 8.3*  8.2* 8.7* 8.5*  MG 2.2  --  2.1 2.2 2.0  --  2.2 2.3  PHOS 3.2  --  2.6 2.8 3.2  --  3.6  --    < > = values in this interval not displayed.    Liver Function Tests: Recent Labs  Lab 02/01/21 0515 02/02/21 0510 02/03/21 0517 02/04/21 0435 02/05/21 0418  AST 24 29 28 29  33  ALT 27 26 25 20 17   ALKPHOS 75 71 83 84 93  BILITOT 1.4* 1.0 1.0 0.9 0.9  PROT 5.8* 5.8* 5.7* 5.7* 6.0*  ALBUMIN 2.3* 2.2* 2.1* 2.1* 2.4*   No results for input(s): LIPASE, AMYLASE in the last 168 hours. Recent Labs  Lab 01/31/21 1106  AMMONIA 19    CBC: Recent Labs  Lab 02/01/21 0515 02/02/21 0510 02/03/21 0517 02/05/21 0418 02/06/21 0219  WBC 12.5* 11.5* 13.5* 13.3* 11.0*  NEUTROABS  --   --   --   --  8.0*  HGB 13.1 13.4 13.0 13.5 13.3  HCT 39.3 39.5 38.5* 41.1 39.8  MCV 85.1 85.7 85.0 85.4 85.6  PLT 153 177 209 277 278    Cardiac Enzymes: No results for input(s): CKTOTAL, CKMB, CKMBINDEX, TROPONINI in the last 168 hours.  BNP: BNP (last 3  results) Recent Labs    01/19/21 1237 01/27/21 0447 01/29/21 2020  BNP 942.4* 681.0* 333.2*    ProBNP (last 3 results) No results for input(s): PROBNP in the last 8760 hours.    Other results:  Imaging: ECHO TEE  Result Date: 02/05/2021    TRANSESOPHOGEAL ECHO REPORT   Patient Name:   Anthony Weber Date of Exam: 02/05/2021 Medical Rec #:  [id number]            Height:       70.0 in Accession #:    [id number]           Weight:       296.1 lb Date of Birth:  12-12-1945            BSA:          2.466 m Patient Age:    75 years             BP:           107/62 mmHg Patient Gender: M                    HR:           74 bpm. Exam Location:  Inpatient Procedure: Transesophageal Echo and Color Doppler Indications:     bacteremia  History:         Patient has prior history of Echocardiogram examinations, most                  recent 01/19/2021. CHF; Signs/Symptoms:Bacteremia.  Sonographer:     Johny Chess RDCS Referring Phys:  [id number] Mung Rinker  R Janay Canan Diagnosing Phys: Glori Bickers MD PROCEDURE: After discussion of the risks and benefits of a TEE, an informed consent was obtained from the patient. The transesophogeal probe was passed without difficulty through the esophogus of the patient. Imaged were obtained with the patient in a supine position. Sedation performed by different physician. The patient was monitored while under deep sedation. Anesthestetic sedation was provided intravenously by Anesthesiology: 147mg  of Propofol. Image quality was excellent. The patient developed no  complications during the procedure. IMPRESSIONS  1. Left ventricular ejection fraction, by estimation, is 25 to 30%. The left ventricle has severely decreased function. The left ventricle demonstrates global hypokinesis. The left ventricular internal cavity size was mildly dilated.  2. Right ventricular systolic function is moderately reduced. The right ventricular size is mildly enlarged.  3. Left atrial size was severely dilated. No left atrial/left atrial appendage thrombus was detected.  4. Right atrial size was severely dilated.  5. No vegetation. The mitral valve is normal in structure. Moderate mitral valve regurgitation.  6. No vegetation.  7. No vegetation. The aortic valve is normal in structure. Aortic valve regurgitation is not visualized.  8. No vegetation.  9. 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 Conclusion(s)/Recommendation(s): No evidence of vegetation/infective endocarditis on this transesophageal echocardiogram. FINDINGS  Left Ventricle: Left ventricular ejection fraction, by estimation, is 25 to 30%. The left ventricle has severely decreased function. The left ventricle demonstrates global hypokinesis. The left ventricular internal cavity size was mildly dilated. Right Ventricle: The right ventricular size is mildly enlarged. No increase in right ventricular wall thickness. Right ventricular systolic function is  moderately reduced. Left Atrium: Left atrial size was severely dilated. No left atrial/left atrial appendage thrombus was detected. Right Atrium: Right atrial size was severely dilated. Pericardium: There is no evidence of pericardial effusion. Mitral Valve: No vegetation. The  mitral valve is normal in structure. Moderate mitral valve regurgitation, with centrally-directed jet. Tricuspid Valve: No vegetation. The tricuspid valve is normal in structure. Tricuspid valve regurgitation is mild. Aortic Valve: No vegetation. The aortic valve is normal in structure. Aortic valve regurgitation is not visualized. Pulmonic Valve: No vegetation. The pulmonic valve was normal in structure. Pulmonic valve regurgitation is trivial. Aorta: The aortic root is normal in size and structure. There is moderate (Grade III) plaque involving the descending aorta. IAS/Shunts: No atrial level shunt detected by color flow Doppler. Glori Bickers MD Electronically signed by Glori Bickers MD Signature Date/Time: 02/05/2021/12:45:59 PM    Final      Medications:     Scheduled Medications:  amiodarone  200 mg Oral BID   Chlorhexidine Gluconate Cloth  6 each Topical Daily   heparin  5,000 Units Subcutaneous Q8H   insulin aspart  0-15 Units Subcutaneous TID WC   levothyroxine  50 mcg Oral Q0600   mexiletine  150 mg Oral Q12H   midodrine  10 mg Oral TID WC   polyethylene glycol  17 g Oral BID   pravastatin  10 mg Oral q1800   senna-docusate  1 tablet Oral BID   torsemide  20 mg Oral Daily    Infusions:   ceFAZolin (ANCEF) IV 2 g (02/06/21 0605)    PRN Medications: acetaminophen, docusate sodium, ondansetron (ZOFRAN) IV, polyethylene glycol   Assessment/Plan:   1. Shock - Cardiogenic/Septic  - Initial presentation consistent with cardiogenic shock but now with septic overlay with PNA and MSSA bacteremia - Procalcitonin 0.8  Lactic Acid 1.2 . RHC cardiac index 1.5  - BCx 2/2 MSSA. Urine Cx negative - Now on  Ancef.  Continue midodrine - TEE negative for endocarditis.    2. Acute Systolic Heart Failure  - Echo EF now down from 55-->20-25%. Cath with no CAD, elevated filling pressures and low output heart failure.  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  - Continues with high PVC burden despite IV amio. Susepct PVC induced cardiomyopathy. Continue amio to suppress burden. Mexilitene added 10/26  - Off milrinone.   - Volume status stable.  - Continue po torsemide -> increase to 40 daily -- No room for GDMT with hypotension. Remains on midodrine 10 mg TID   3. Acute Hypoxic/Hypercarbia Respiratory Failure  - Improved with volume removal. Continue to wean oxygen as needed.  - Sats stable on 2 liters [place]. Continue wean as able.    4. NSVT, PVC  -High PVC burden On amio drip. PVCs burden reduced. Continue amio. Mexilitene added 10/26   Keep K> 4.0 Mg>2.0 - outpatient sleep study  5. MSSA bacteremia - likely respiratory source - now on ancef. ID following. Will need line holiday before placing Home IV access - TEE - no vegetation    6. AKI  - Creatinine peaked at 2.1 , now normalized    7. Hyponatremia - Sodium stable at 128   - Restrict free water  8. Morbid Obesity   Body mass index is 42.48 kg/m.  9.  AMS - ABC with CO2 retention   10. Suspected Sleep Apnea -will need outpatient sleep study  11. Hypothyroidism  Check TSH Start home dose of levothyroixine.    12. Hypokalemia - K 4.3 today  From HF perspective stable.  Torsemide 40 mg daily  Midodrine 10 mg tid  Amiodarone 200 mg daily Mexilitene 150 mg twice a day  Pravastatin 10 mg daily   Length of Stay: 8   Amy CleggNP-_C  02/06/2021, 11:23 AM  Advanced Heart Failure Team Pager [phone] (M-F; 7a - 4p)  Please contact [facility] for night-coverage after hours (4p -7a ) and weekends on [email or web address]  Patient seen and examined  with the above-signed Advanced Practice Provider and/or Housestaff. I personally reviewed laboratory data, imaging studies and relevant notes. I independently examined the patient and formulated the important aspects of the plan. I have edited the note to reflect any of my changes or salient points. I have personally discussed the plan with the patient and/or family.  Feeling much better. TEE negative for endocarditis. Denies SOB, orthopnea or PND. PVCS almost completely suppressed with mexilitene.   General:  Obese mal lying in bed  No resp difficulty HEENT: normal Neck: supple. no JVD. Carotids 2+ bilat; no bruits. No lymphadenopathy or thryomegaly appreciated. Cor: PMI nondisplaced. Regular rate & rhythm. No rubs, gallops or murmurs. Lungs: clear Abdomen: obese soft, nontender, nondistended. No hepatosplenomegaly. No bruits or masses. Good bowel sounds. Extremities: no cyanosis, clubbing, rash, 1+ edema Neuro: alert & orientedx3, cranial nerves grossly intact. moves all 4 extremities w/o difficulty. Affect pleasant  Ok for d/c to SNF from our standpoint. Increase torsemide to 40 daily. PVCs have responded much better to mexilitene rather than amio. Cut amio to 200 daily. (Likely stop as outpatient).   HF team will sign off.   Glori Bickers, MD  5:20 PM

## 2021-02-06 NOTE — Care Management Important Message (Signed)
Important Message  Patient Details  Name: Anthony Weber MRN: [id number] Date of Birth: [birth year]   Medicare Important Message Given:  Yes     Shelda Altes 02/06/2021, 10:48 AM

## 2021-02-06 NOTE — Progress Notes (Signed)
PROGRESS NOTE    Anthony Weber  CWU:[id number] DOB: [birth year] DOA: 01/29/2021 PCP: Albina Billet, MD   Brief Narrative:  75 yo M PMH morbid obesity, HTN, HLD, hypothyroidism, HFrEF who presented to [facility] ED 10/9 with CC BLE swelling. He reportedly gained 15lb over 1wk prior to presentation, had incr DOE. He was admitted to hospitalist service for acute heart failure exacerbation. Cardiology was consulted 10/10 and recommended diuresis, lisonopril and repeat ECHO which revealed LVEF 25-30% with indeterminate LV diastolic parameters and poorly visualized RV systolic function.  He received ongoing medication optimization and went for [facility] 10/18 which showed no significant CAD, moderately elevated L heart and PA pressure, severely elevated R Heart pressures. He was transferred to the ICU 10/18 and started on a milrinone infusion for cardiogenic shock.   On 10/20 the pt was started on a lasix gtt for hypoxia in setting of cardiogenic shock and a plan was made to transfer the patient to [place] for further management and care.  He was admitted to ICU and eventually was transitioned to [facility] care on 02/05/2021.  Significant Hospital Events: Including procedures, antibiotic start and stop dates in addition to other pertinent events   10/9 admitted to [facility] with likely HF exacerbation. Diuresed  10/10 cards consulted for HF recs  10/18 R/LHC without significant CAD, but had incr L heart, PA, and R heart pressures. Transferred to ICU, started on milronone> developed hypotension and NSVT> milrinone reduced and started norepi and amiodarone.  10/20 Transferred to [facility] 10/21 MSSA in blood cultures, A-line placed and removed overnight 10/24 pressors weaned off.  Transferred under [place] again on 02/05/2021    Assessment & Plan:   Principal Problem:   MSSA bacteremia Active Problems:   Cardiogenic shock ([place])   Central line infection   Pneumonia of both lower lobes due to methicillin  susceptible Staphylococcus aureus (MSSA) (HCC)   Hyponatremia   Acute on chronic combined systolic and diastolic congestive heart failure (HCC)   Acute on chronic respiratory failure with hypoxia and hypercapnia (HCC)  Shock - Cardiogenic/Septic/MSSA bacteremia - Initial presentation consistent with cardiogenic shock but now with septic overlay with PNA and MSSA bacteremia - Procalcitonin 0.8  Lactic Acid 1.2 . RHC cardiac index 1.5  - BCx on 01/30/2021, 2/2 MSSA. Urine Cx negative, A-line placed and removed overnight.  Blood cultures since then have been negative.  Once again central line removed on 02/04/2021 and blood cultures drawn.  ID managing antibiotics.  Currently on Ancef.  Midline order placed by ID.  Fortunately, TEE was negative for any endocarditis, end date for cefazolin 03/09/2021.   Acute Systolic Heart Failure  - Echo EF now down from 55-->20-25%. Cath with no CAD, elevated filling pressures and low output heart failure.  - Off milrinone. Volume status ok. Continue po torsemide -- No room for GDMT with hypotension. Remains on midodrine 10 mg TID   Acute Hypoxic/Hypercarbia Respiratory Failure  - Improved with volume removal. Continue to wean oxygen as needed.  - Sats stable around 93% on 3 liters [place]. Continue wean as able.  Encouraged incentive spirometry.   NSVT, PVC  -High PVC burden On amio drip. PVCs burden reduced. Continue amio. Mexilitene added 10/26   Keep K> 4.0 Mg>2.0 - outpatient sleep study   AKI  - Creatinine peaked at 2.1 , now resolved.   Hyponatremia - Sodium improved, 128 today. - Restrict free water   Morbid Obesity   Body mass index is 42.48 kg/m.  AMS - ABC with CO2 retention, resolved, currently fully alert and oriented.   Suspected Sleep Apnea -will need outpatient sleep study.  Continue BiPAP at night and as needed BiPAP during daytime.   Hypothyroidism  Continue Synthroid.   Hypokalemia Resolved  DVT prophylaxis: Place TED hose  Start: 02/03/21 0905 heparin injection 5,000 Units Start: 01/29/21 2200 SCDs Start: 01/29/21 2001   Code Status: Full Code  Family Communication: None at bedside, plan of care discussed with patient.  Status is: Inpatient  Remains inpatient appropriate because: Needs further work-up.  Estimated body mass index is 42.48 kg/m as calculated from the following:   Height as of this encounter: 5\' 10"  (1.778 m).   Weight as of this encounter: 134.3 kg.     Nutritional Assessment: Body mass index is 42.48 kg/m.Marland Kitchen Seen by dietician.  I agree with the assessment and plan as outlined below: Nutrition Status:  Skin Assessment: I have examined the patient's skin and I agree with the wound assessment as performed by the wound care RN as outlined below:    Consultants:  Cardiology and ID  Procedures:  As above  Antimicrobials:  Anti-infectives (From admission, onward)    Start     Dose/Rate Route Frequency Ordered Stop   02/02/21 1615  ceFAZolin (ANCEF) IVPB 2g/100 mL premix        2 g 200 mL/hr over 30 Minutes Intravenous Every 8 hours 02/02/21 1515 03/04/21 2359   02/01/21 0845  ceFEPIme (MAXIPIME) 2 g in sodium chloride 0.9 % 100 mL IVPB  Status:  Discontinued        2 g 200 mL/hr over 30 Minutes Intravenous Every 8 hours 02/01/21 0756 02/02/21 1515   01/30/21 1015  ceFAZolin (ANCEF) IVPB 2g/100 mL premix  Status:  Discontinued        2 g 200 mL/hr over 30 Minutes Intravenous Every 8 hours 01/30/21 0927 01/30/21 0930   01/29/21 2115  vancomycin (VANCOREADY) IVPB 1250 mg/250 mL  Status:  Discontinued        1,250 mg 166.7 mL/hr over 90 Minutes Intravenous Every 24 hours 01/29/21 2029 01/31/21 1048   01/29/21 2100  ceFEPIme (MAXIPIME) 2 g in sodium chloride 0.9 % 100 mL IVPB  Status:  Discontinued        2 g 200 mL/hr over 30 Minutes Intravenous Every 12 hours 01/29/21 2004 02/01/21 0756          Subjective:  Patient seen and examined.  He has no  complaints.  Objective: Vitals:   02/05/21 2327 02/06/21 0342 02/06/21 0757 02/06/21 1120  BP: 102/61 (!) 110/58 (!) 82/47 (!) 103/53  Pulse: 64 66 68 71  Resp: 14 15 14 16   Temp: 97.9 F (36.6 C) 97.6 F (36.4 C) 97.9 F (36.6 C) (!) 97 F (36.1 C)  TempSrc: Oral Oral Oral Axillary  SpO2: 96% 98% 97% 95%  Weight:  134.3 kg    Height:        Intake/Output Summary (Last 24 hours) at 02/06/2021 1330 Last data filed at 02/06/2021 1000 Gross per 24 hour  Intake --  Output 1000 ml  Net -1000 ml    Filed Weights   02/05/21 0500 02/05/21 1109 02/06/21 0342  Weight: 134.3 kg 134.3 kg 134.3 kg    Examination:  General exam: Appears calm and comfortable, morbidly obese Respiratory system: Diminished breath sounds bilaterally, respiratory effort normal. Cardiovascular system: S1 & S2 heard, RRR. No JVD, murmurs, rubs, gallops or clicks.  +2 pitting edema bilateral  lower extremity. Gastrointestinal system: Abdomen is nondistended, soft and nontender. No organomegaly or masses felt. Normal bowel sounds heard. Central nervous system: Alert and oriented. No focal neurological deficits. Extremities: Symmetric 5 x 5 power. Skin: No rashes, lesions or ulcers.  Psychiatry: Judgement and insight appear normal. Mood & affect appropriate.   Data Reviewed: I have personally reviewed following labs and imaging studies  CBC: Recent Labs  Lab 02/01/21 0515 02/02/21 0510 02/03/21 0517 02/05/21 0418 02/06/21 0219  WBC 12.5* 11.5* 13.5* 13.3* 11.0*  NEUTROABS  --   --   --   --  8.0*  HGB 13.1 13.4 13.0 13.5 13.3  HCT 39.3 39.5 38.5* 41.1 39.8  MCV 85.1 85.7 85.0 85.4 85.6  PLT 153 177 209 277 852    Basic Metabolic Panel: Recent Labs  Lab 01/31/21 0500 01/31/21 1745 02/01/21 0515 02/02/21 0510 02/03/21 0517 02/04/21 0435 02/05/21 0418 02/06/21 0219  NA 124*   < > 129* 130* 127* 127* 127* 128*  K 3.8   < > 4.1 4.5 3.7 4.4 4.6 4.3  CL 81*   < > 82* 83* 82* 85* 90* 94*   CO2 33*   < > 37* 39* 37* 33* 29 29  GLUCOSE 119*   < > 123* 114* 127* 100* 92 101*  BUN 76*   < > 71* 63* 54* 47* 40* 38*  CREATININE 1.65*   < > 1.19 1.06 1.06 1.01 1.05 0.89  CALCIUM 7.9*   < > 8.5* 8.4* 8.3* 8.2* 8.7* 8.5*  MG 2.2  --  2.1 2.2 2.0  --  2.2 2.3  PHOS 3.2  --  2.6 2.8 3.2  --  3.6  --    < > = values in this interval not displayed.    GFR: Estimated Creatinine Clearance: 97.4 mL/min (by C-G formula based on SCr of 0.89 mg/dL). Liver Function Tests: Recent Labs  Lab 02/01/21 0515 02/02/21 0510 02/03/21 0517 02/04/21 0435 02/05/21 0418  AST 24 29 28 29  33  ALT 27 26 25 20 17   ALKPHOS 75 71 83 84 93  BILITOT 1.4* 1.0 1.0 0.9 0.9  PROT 5.8* 5.8* 5.7* 5.7* 6.0*  ALBUMIN 2.3* 2.2* 2.1* 2.1* 2.4*    No results for input(s): LIPASE, AMYLASE in the last 168 hours. Recent Labs  Lab 01/31/21 1106  AMMONIA 19    Coagulation Profile: No results for input(s): INR, PROTIME in the last 168 hours. Cardiac Enzymes: No results for input(s): CKTOTAL, CKMB, CKMBINDEX, TROPONINI in the last 168 hours. BNP (last 3 results) No results for input(s): PROBNP in the last 8760 hours. HbA1C: No results for input(s): HGBA1C in the last 72 hours. CBG: Recent Labs  Lab 02/05/21 1712 02/05/21 2117 02/06/21 0550 02/06/21 0900 02/06/21 1114  GLUCAP 149* 84 109* 88 216*    Lipid Profile: No results for input(s): CHOL, HDL, LDLCALC, TRIG, CHOLHDL, LDLDIRECT in the last 72 hours. Thyroid Function Tests: No results for input(s): TSH, T4TOTAL, FREET4, T3FREE, THYROIDAB in the last 72 hours. Anemia Panel: No results for input(s): VITAMINB12, FOLATE, FERRITIN, TIBC, IRON, RETICCTPCT in the last 72 hours. Sepsis Labs: No results for input(s): PROCALCITON, LATICACIDVEN in the last 168 hours.   Recent Results (from the past 240 hour(s))  CULTURE, BLOOD (ROUTINE X 2) w Reflex to ID Panel     Status: Abnormal   Collection Time: 01/29/21  2:00 PM   Specimen: BLOOD  Result  Value Ref Range Status   Specimen Description   Final  BLOOD LEFT ANTECUBITAL Performed at [facility], [address], [place], [place] [address]    Special Requests   Final    BOTTLES DRAWN AEROBIC AND ANAEROBIC Blood Culture adequate volume Performed at [facility], [address] [place], [place] [address]    Culture  Setup Time   Final    IN BOTH AEROBIC AND ANAEROBIC BOTTLES GRAM POSITIVE COCCI CRITICAL RESULT CALLED TO, READ BACK BY AND VERIFIED WITH: GREGG ABBOTT @0345  ON 01/30/21 SKL Performed at [facility], [place] [address] [place], [place] [address]    Culture STAPHYLOCOCCUS AUREUS (A)  Final   Report Status 02/01/2021 FINAL  Final   Organism ID, Bacteria STAPHYLOCOCCUS AUREUS  Final      Susceptibility   Staphylococcus aureus - MIC*    CIPROFLOXACIN <=0.5 SENSITIVE Sensitive     ERYTHROMYCIN >=8 RESISTANT Resistant     GENTAMICIN <=0.5 SENSITIVE Sensitive     OXACILLIN <=0.25 SENSITIVE Sensitive     TETRACYCLINE <=1 SENSITIVE Sensitive     VANCOMYCIN <=0.5 SENSITIVE Sensitive     TRIMETH/SULFA <=10 SENSITIVE Sensitive     CLINDAMYCIN RESISTANT Resistant     RIFAMPIN <=0.5 SENSITIVE Sensitive     Inducible Clindamycin POSITIVE Resistant     * STAPHYLOCOCCUS AUREUS  Blood Culture ID Panel (Reflexed)     Status: Abnormal   Collection Time: 01/29/21  2:00 PM  Result Value Ref Range Status   Enterococcus faecalis NOT DETECTED NOT DETECTED Final   Enterococcus Faecium NOT DETECTED NOT DETECTED Final   Listeria monocytogenes NOT DETECTED NOT DETECTED Final   Staphylococcus species DETECTED (A) NOT DETECTED Final    Comment: CRITICAL RESULT CALLED TO, READ BACK BY AND VERIFIED WITH: GREGG ABBOTT @0345  ON 01/30/21 SKL    Staphylococcus aureus (BCID) DETECTED (A) NOT DETECTED Final    Comment: CRITICAL RESULT CALLED TO, READ BACK BY AND VERIFIED WITH: GREGG ABBOTT @0345  ON 01/30/21 SKL    Staphylococcus epidermidis NOT DETECTED NOT  DETECTED Final   Staphylococcus lugdunensis NOT DETECTED NOT DETECTED Final   Streptococcus species NOT DETECTED NOT DETECTED Final   Streptococcus agalactiae NOT DETECTED NOT DETECTED Final   Streptococcus pneumoniae NOT DETECTED NOT DETECTED Final   Streptococcus pyogenes NOT DETECTED NOT DETECTED Final   A.calcoaceticus-baumannii NOT DETECTED NOT DETECTED Final   Bacteroides fragilis NOT DETECTED NOT DETECTED Final   Enterobacterales NOT DETECTED NOT DETECTED Final   Enterobacter cloacae complex NOT DETECTED NOT DETECTED Final   Escherichia coli NOT DETECTED NOT DETECTED Final   Klebsiella aerogenes NOT DETECTED NOT DETECTED Final   Klebsiella oxytoca NOT DETECTED NOT DETECTED Final   Klebsiella pneumoniae NOT DETECTED NOT DETECTED Final   Proteus species NOT DETECTED NOT DETECTED Final   Salmonella species NOT DETECTED NOT DETECTED Final   Serratia marcescens NOT DETECTED NOT DETECTED Final   Haemophilus influenzae NOT DETECTED NOT DETECTED Final   Neisseria meningitidis NOT DETECTED NOT DETECTED Final   Pseudomonas aeruginosa NOT DETECTED NOT DETECTED Final   Stenotrophomonas maltophilia NOT DETECTED NOT DETECTED Final   Candida albicans NOT DETECTED NOT DETECTED Final   Candida auris NOT DETECTED NOT DETECTED Final   Candida glabrata NOT DETECTED NOT DETECTED Final   Candida krusei NOT DETECTED NOT DETECTED Final   Candida parapsilosis NOT DETECTED NOT DETECTED Final   Candida tropicalis NOT DETECTED NOT DETECTED Final   Cryptococcus neoformans/gattii NOT DETECTED NOT DETECTED Final   Meth resistant mecA/C and MREJ NOT DETECTED NOT DETECTED Final  Comment: Performed at [facility], [address] [place], [place] [address]  CULTURE, BLOOD (ROUTINE X 2) w Reflex to ID Panel     Status: Abnormal   Collection Time: 01/29/21  2:03 PM   Specimen: BLOOD  Result Value Ref Range Status   Specimen Description   Final    BLOOD BLOOD LEFT HAND Performed at [facility], [address] [place], [place] [address]    Special Requests   Final    BOTTLES DRAWN AEROBIC AND ANAEROBIC Blood Culture adequate volume Performed at [facility], [address] [place], [place] [address]    Culture  Setup Time   Final    IN BOTH AEROBIC AND ANAEROBIC BOTTLES GRAM POSITIVE COCCI CRITICAL VALUE NOTED.  VALUE IS CONSISTENT WITH PREVIOUSLY REPORTED AND CALLED VALUE. Performed at [facility], [address] [place], [place] [address]    Culture (A)  Final    STAPHYLOCOCCUS AUREUS SUSCEPTIBILITIES PERFORMED ON PREVIOUS CULTURE WITHIN THE LAST 5 DAYS. Performed at [facility], [place] [address], [place], [place] [address]    Report Status 02/01/2021 FINAL  Final  Urine Culture     Status: None   Collection Time: 01/29/21  8:38 PM   Specimen: Urine, Catheterized  Result Value Ref Range Status   Specimen Description URINE, CATHETERIZED  Final   Special Requests NONE  Final   Culture   Final    NO GROWTH Performed at [facility], [place] [address] [place], [place] [address]    Report Status 01/31/2021 FINAL  Final  Expectorated Sputum Assessment w Gram Stain, Rflx to Resp Cult     Status: None   Collection Time: 01/29/21  8:38 PM   Specimen: Sputum  Result Value Ref Range Status   Specimen Description SPUTUM  Final   Special Requests NONE  Final   Sputum evaluation   Final    Sputum specimen not acceptable for testing.  Please recollect.   RESULT CALLED TO, READ BACK BY AND VERIFIED WITH: RN KOBE 01/30/21@00 :36 Performed at [facility], [place] [address], [place], [place] [address]    Report Status 01/31/2021 FINAL  Final  Expectorated Sputum Assessment w Gram Stain, Rflx to Resp Cult     Status: None   Collection Time: 01/30/21  9:04 AM   Specimen: Sputum  Result Value Ref Range Status   Specimen Description SPUTUM  Final   Special Requests  EXPECTORATED  Final   Sputum evaluation   Final    THIS SPECIMEN  IS ACCEPTABLE FOR SPUTUM CULTURE Performed at [facility], [place] [address] [place] [address], [place], [place] [address]    Report Status 01/30/2021 FINAL  Final  Culture, Respiratory w Gram Stain     Status: None   Collection Time: 01/30/21  9:04 AM   Specimen: SPU  Result Value Ref Range Status   Specimen Description SPUTUM  Final   Special Requests  EXPECTORATED Reflexed from [id number]  Final   Gram Stain   Final    FEW SQUAMOUS EPITHELIAL CELLS PRESENT FEW WBC SEEN FEW GRAM POSITIVE COCCI    Culture   Final    FEW Consistent with normal respiratory flora. No Pseudomonas species isolated Performed at [place] [address], [place], [place] [address]    Report Status 02/02/2021 FINAL  Final  Culture, blood (routine x 2)     Status: None   Collection Time: 01/30/21 10:08 AM  Specimen: BLOOD RIGHT HAND  Result Value Ref Range Status   Specimen Description BLOOD RIGHT HAND  Final   Special Requests   Final    BOTTLES DRAWN AEROBIC AND ANAEROBIC Blood Culture adequate volume   Culture   Final    NO GROWTH 5 DAYS Performed at [facility], [address], [place], [place] [address]    Report Status 02/04/2021 FINAL  Final  Culture, blood (routine x 2)     Status: None   Collection Time: 01/30/21 10:09 AM   Specimen: BLOOD LEFT HAND  Result Value Ref Range Status   Specimen Description BLOOD LEFT HAND  Final   Special Requests   Final    BOTTLES DRAWN AEROBIC AND ANAEROBIC Blood Culture adequate volume   Culture   Final    NO GROWTH 5 DAYS Performed at [facility], [place] [address] [place], [place] [address]    Report Status 02/04/2021 FINAL  Final  MRSA Next Gen by PCR, Nasal     Status: None   Collection Time: 01/30/21 11:01 AM   Specimen: Nasal Mucosa; Nasal Swab  Result Value Ref Range Status   MRSA by PCR Next Gen NOT DETECTED NOT DETECTED Final    Comment: (NOTE) The GeneXpert MRSA Assay (FDA approved for NASAL specimens only), is one component of a  comprehensive MRSA colonization surveillance program. It is not intended to diagnose MRSA infection nor to guide or monitor treatment for MRSA infections. Test performance is not FDA approved in patients less than 84 years old. Performed at [facility], [place] [address], [place], [place] [address]   Culture, blood (routine x 2)     Status: None   Collection Time: 01/31/21  6:08 AM   Specimen: BLOOD RIGHT HAND  Result Value Ref Range Status   Specimen Description BLOOD RIGHT HAND  Final   Special Requests   Final    AEROBIC BOTTLE ONLY Blood Culture results may not be optimal due to an inadequate volume of blood received in culture bottles   Culture   Final    NO GROWTH 5 DAYS Performed at [facility], [place] [address] [place], [place] [address]    Report Status 02/05/2021 FINAL  Final  Culture, blood (routine x 2)     Status: None   Collection Time: 01/31/21  3:06 PM   Specimen: BLOOD  Result Value Ref Range Status   Specimen Description BLOOD SITE NOT SPECIFIED  Final   Special Requests   Final    BOTTLES DRAWN AEROBIC ONLY Blood Culture results may not be optimal due to an inadequate volume of blood received in culture bottles   Culture   Final    NO GROWTH 5 DAYS Performed at [facility], [place] [address] [place], [place] [address]    Report Status 02/05/2021 FINAL  Final  Culture, blood (routine x 2)     Status: None (Preliminary result)   Collection Time: 02/04/21 12:56 PM   Specimen: BLOOD  Result Value Ref Range Status   Specimen Description BLOOD LEFT ANTECUBITAL  Final   Special Requests   Final    BOTTLES DRAWN AEROBIC AND ANAEROBIC Blood Culture adequate volume   Culture   Final    NO GROWTH 2 DAYS Performed at [facility], [place] [address], [place], [place] [address]    Report Status PENDING  Incomplete  Culture, blood (routine x 2)     Status:  None (Preliminary result)   Collection Time: 02/04/21  1:03 PM   Specimen: BLOOD  Result Value  Ref Range Status   Specimen Description BLOOD LEFT ANTECUBITAL  Final   Special Requests   Final    BOTTLES DRAWN AEROBIC AND ANAEROBIC Blood Culture adequate volume   Culture   Final    NO GROWTH 2 DAYS Performed at [facility], [address] [place], [place] [address]    Report Status PENDING  Incomplete       Radiology Studies: ECHO TEE  Result Date: 02/05/2021    TRANSESOPHOGEAL ECHO REPORT   Patient Name:   Anthony Weber Date of Exam: 02/05/2021 Medical Rec #:  [id number]            Height:       70.0 in Accession #:    [id number]           Weight:       296.1 lb Date of Birth:  October 31, 1945            BSA:          2.466 m Patient Age:    43 years             BP:           107/62 mmHg Patient Gender: M                    HR:           74 bpm. Exam Location:  Inpatient Procedure: Transesophageal Echo and Color Doppler Indications:     bacteremia  History:         Patient has prior history of Echocardiogram examinations, most                  recent 01/19/2021. CHF; Signs/Symptoms:Bacteremia.  Sonographer:     Johny Chess RDCS Referring Phys:  [id number] DANIEL R BENSIMHON Diagnosing Phys: Glori Bickers MD PROCEDURE: After discussion of the risks and benefits of a TEE, an informed consent was obtained from the patient. The transesophogeal probe was passed without difficulty through the esophogus of the patient. Imaged were obtained with the patient in a supine position. Sedation performed by different physician. The patient was monitored while under deep sedation. Anesthestetic sedation was provided intravenously by Anesthesiology: 147mg  of Propofol. Image quality was excellent. The patient developed no  complications during the procedure. IMPRESSIONS  1. Left ventricular ejection fraction, by estimation, is 25 to 30%. The left ventricle has severely decreased function. The left ventricle demonstrates global hypokinesis. The left ventricular internal cavity size was mildly  dilated.  2. Right ventricular systolic function is moderately reduced. The right ventricular size is mildly enlarged.  3. Left atrial size was severely dilated. No left atrial/left atrial appendage thrombus was detected.  4. Right atrial size was severely dilated.  5. No vegetation. The mitral valve is normal in structure. Moderate mitral valve regurgitation.  6. No vegetation.  7. No vegetation. The aortic valve is normal in structure. Aortic valve regurgitation is not visualized.  8. No vegetation.  9. 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 Conclusion(s)/Recommendation(s): No evidence of vegetation/infective endocarditis on this transesophageal echocardiogram. FINDINGS  Left Ventricle: Left ventricular ejection fraction, by estimation, is 25 to 30%. The left ventricle has severely decreased function. The left ventricle demonstrates global hypokinesis. The left ventricular internal cavity size was mildly dilated. Right Ventricle: The right ventricular size is mildly enlarged. No  increase in right ventricular wall thickness. Right ventricular systolic function is moderately reduced. Left Atrium: Left atrial size was severely dilated. No left atrial/left atrial appendage thrombus was detected. Right Atrium: Right atrial size was severely dilated. Pericardium: There is no evidence of pericardial effusion. Mitral Valve: No vegetation. The mitral valve is normal in structure. Moderate mitral valve regurgitation, with centrally-directed jet. Tricuspid Valve: No vegetation. The tricuspid valve is normal in structure. Tricuspid valve regurgitation is mild. Aortic Valve: No vegetation. The aortic valve is normal in structure. Aortic valve regurgitation is not visualized. Pulmonic Valve: No vegetation. The pulmonic valve was normal in structure. Pulmonic valve regurgitation is trivial. Aorta: The aortic root is normal in size and structure. There is moderate (Grade III) plaque involving the  descending aorta. IAS/Shunts: No atrial level shunt detected by color flow Doppler. Glori Bickers MD Electronically signed by Glori Bickers MD Signature Date/Time: 02/05/2021/12:45:59 PM    Final     Scheduled Meds:  amiodarone  200 mg Oral BID   Chlorhexidine Gluconate Cloth  6 each Topical Daily   heparin  5,000 Units Subcutaneous Q8H   insulin aspart  0-15 Units Subcutaneous TID WC   levothyroxine  50 mcg Oral Q0600   mexiletine  150 mg Oral Q12H   midodrine  10 mg Oral TID WC   polyethylene glycol  17 g Oral BID   pravastatin  10 mg Oral q1800   senna-docusate  1 tablet Oral BID   [START ON 02/07/2021] torsemide  40 mg Oral Daily   Continuous Infusions:   ceFAZolin (ANCEF) IV 2 g (02/06/21 0605)     LOS: 8 days   Time spent: 30 minutes   Darliss Cheney, MD [facility]  02/06/2021, 1:30 PM  Please page via [place] and do not message via secure chat for anything urgent. Secure chat can be used for anything non urgent.  How to contact the [facility] Attending or Consulting provider [place] or covering provider during after hours [place], for this patient?  Check the care team in [facility] and look for a) attending/consulting TRH provider listed and b) the [facility] team listed. Page or secure chat [phone]. Log into [email or web address] and use [place]'s universal password to access. If you do not have the password, please contact the hospital operator. Locate the [employer] provider you are looking for under [facility] and page to a number that you can be directly reached. If you still have difficulty reaching the provider, please page the [facility] (Director on Call) for the Hospitalists listed on amion for assistance.

## 2021-02-06 NOTE — Plan of Care (Signed)

## 2021-02-06 NOTE — TOC Progression Note (Addendum)
Transition of Care [facility]) - Progression Note    Patient Details  Name: Anthony Weber MRN: [id number] Date of Birth: [birth year]  Transition of Care [facility]) CM/SW [place], [place] Phone Number: 02/06/2021, 12:01 PM  Clinical Narrative:    HF CSW received a call from Army Melia from [facility], SNF reporting that she does have a bed available for rehab for Mr. Grill but that they do not have a Cpap or BiPap's available at the facility for him to use and if we can order one for him before he DC's or find out if it will be needed at time of DC. Attending MD reported that Mr. Korinek will need a BIPAP at DC. [facility] unable to provide this service and CSW spoke with the patient's spouse Hassan Rowan [phone] who reported if [facility] isn't an option that [facility] or [facility] would be preference. [facility] is an ALF not a SNF. CSW attempted to outreach [facility] but has not been able to get in touch with anyone. Seth Bake from [facility] reported they do not have any beds available at this time. Mrs. Rocko Fesperman reported that she does not want [facility], [facility] or [facility] and would rather take her husband home before sending to some of those facilities for rehab. Mrs. [place] Ice would like to see how much a BIPAP would cost out of pocket to see if she can just pay for it and bring to the SNF, [facility]. CSW reached out to [facility] [phone] to get an estimate for the BIPAP and they will return the call and let the CSW know.  CSW reached out to the [facility] in [place] to see about bed availability however the CSW had to leave a voicemail for them to return the call. CSW faxed out Mr. Gesell's clinicals to all SNF's in the surrounding area to see what other bed offers come through. Mrs. Howk would prefer to have [facility] or somewhere closer to home and not drive to [place] but is willing to extend the search further. CSW received a call back from [facility] who reported the out of pocket  cost for the BIPAP and 1 month supply of accessories would be $1200 and [facility] would need an order and a prescription for the BIPAP. CSW shared this information with Hassan Rowan who reported that they had her husband on a CPAP and wants to confirm if it is a CPAP or BIPAP. CSW informed Ms. Sula Soda that the doctor reported the patient needs a BIPAP and the patient's spouse will confirm with attending MD and treatment team.  [facility], SNF has a bed available for Mr. Biglow and will start insurance authorization. The family is willing to pay for the BIPAP out of pocket to bring to the facility. HF RNCM is working on getting the BIPAP with [facility] for Mr. Tisby.   CSW will continue to follow throughout discharge.   Expected Discharge Plan: [place] Barriers to Discharge: Continued Medical Work up  Expected Discharge Plan and Services Expected Discharge Plan: [place] In-house Referral: Clinical Social Work Discharge Planning Services: CM Consult   Living arrangements for the past 2 months: Single Family Home                                       Social Determinants of Health (SDOH) Interventions    Readmission Risk Interventions No  flowsheet data found.  Achaia Garlock, MSW, [place] Heart Failure Social Worker

## 2021-02-06 NOTE — TOC CM/SW Note (Addendum)
HF TOC CM reviewed case. Pt will need to dc to SNF with Bipap. CPAP an outpatient sleep study is required. Contacted DME provider to review to see if pt would qualify for Bipap with insurance. Pt does not have outpt sleep study. Per SNF, pt will need to have his own BIPAP to use at facility.   SNF does not accept pt on Trilogy/NIV.     [place], Heart Failure TOC CM [phone]

## 2021-02-06 NOTE — Progress Notes (Signed)
PHARMACY CONSULT NOTE FOR:  OUTPATIENT  PARENTERAL ANTIBIOTIC THERAPY (OPAT)  Indication: MSSA Bacteremia Regimen: Cefazolin 2g IV q8h End date: 03/04/21  IV antibiotic discharge orders are pended. To discharging provider:  please sign these orders via discharge navigator,  Select New Orders & click on the button choice - Manage This Unsigned Work.     Thank you for allowing pharmacy to be a part of this patient's care.  Lestine Box, PharmD PGY2 Infectious Diseases Pharmacy Resident   Please check [email or web address] for unit-specific pharmacy phone numbers

## 2021-02-07 LAB — CBC WITH DIFFERENTIAL/PLATELET
Abs Immature Granulocytes: 0.18 10*3/uL — ABNORMAL HIGH (ref 0.00–0.07)
Basophils Absolute: 0.1 10*3/uL (ref 0.0–0.1)
Basophils Relative: 0 %
Eosinophils Absolute: 0.5 10*3/uL (ref 0.0–0.5)
Eosinophils Relative: 4 %
HCT: 39.9 % (ref 39.0–52.0)
Hemoglobin: 13 g/dL (ref 13.0–17.0)
Immature Granulocytes: 1 %
Lymphocytes Relative: 7 %
Lymphs Abs: 0.8 10*3/uL (ref 0.7–4.0)
MCH: 28.1 pg (ref 26.0–34.0)
MCHC: 32.6 g/dL (ref 30.0–36.0)
MCV: 86.2 fL (ref 80.0–100.0)
Monocytes Absolute: 1.5 10*3/uL — ABNORMAL HIGH (ref 0.1–1.0)
Monocytes Relative: 12 %
Neutro Abs: 9.5 10*3/uL — ABNORMAL HIGH (ref 1.7–7.7)
Neutrophils Relative %: 76 %
Platelets: 305 10*3/uL (ref 150–400)
RBC: 4.63 MIL/uL (ref 4.22–5.81)
RDW: 15.1 % (ref 11.5–15.5)
WBC: 12.5 10*3/uL — ABNORMAL HIGH (ref 4.0–10.5)
nRBC: 0 % (ref 0.0–0.2)

## 2021-02-07 LAB — GLUCOSE, CAPILLARY
Glucose-Capillary: 113 mg/dL — ABNORMAL HIGH (ref 70–99)
Glucose-Capillary: 170 mg/dL — ABNORMAL HIGH (ref 70–99)
Glucose-Capillary: 149 mg/dL — ABNORMAL HIGH (ref 70–99)
Glucose-Capillary: 89 mg/dL (ref 70–99)

## 2021-02-07 LAB — BASIC METABOLIC PANEL
Anion gap: 8 (ref 5–15)
BUN: 36 mg/dL — ABNORMAL HIGH (ref 8–23)
CO2: 28 mmol/L (ref 22–32)
Calcium: 8.6 mg/dL — ABNORMAL LOW (ref 8.9–10.3)
Chloride: 94 mmol/L — ABNORMAL LOW (ref 98–111)
Creatinine, Ser: 1.07 mg/dL (ref 0.61–1.24)
GFR, Estimated: >60 mL/min (ref >60)
Glucose, Bld: 97 mg/dL (ref 70–99)
Potassium: 4.5 mmol/L (ref 3.5–5.1)
Sodium: 130 mmol/L — ABNORMAL LOW (ref 135–145)

## 2021-02-07 NOTE — Plan of Care (Signed)

## 2021-02-07 NOTE — Progress Notes (Signed)
PROGRESS NOTE    Anthony Weber  KVQ:[id number] DOB: [birth year] DOA: 01/29/2021 PCP: Albina Billet, MD   Brief Narrative:  75 yo M PMH morbid obesity, HTN, HLD, hypothyroidism, HFrEF who presented to [facility] ED 10/9 with CC BLE swelling. He reportedly gained 15lb over 1wk prior to presentation, had incr DOE. He was admitted to hospitalist service for acute heart failure exacerbation. Cardiology was consulted 10/10 and recommended diuresis, lisonopril and repeat ECHO which revealed LVEF 25-30% with indeterminate LV diastolic parameters and poorly visualized RV systolic function.  He received ongoing medication optimization and went for [facility] 10/18 which showed no significant CAD, moderately elevated L heart and PA pressure, severely elevated R Heart pressures. He was transferred to the ICU 10/18 and started on a milrinone infusion for cardiogenic shock.   On 10/20 the pt was started on a lasix gtt for hypoxia in setting of cardiogenic shock and a plan was made to transfer the patient to [place] for further management and care.  He was admitted to ICU and eventually was transitioned to [facility] care on 02/05/2021.  Significant Hospital Events: Including procedures, antibiotic start and stop dates in addition to other pertinent events   10/9 admitted to [facility] with likely HF exacerbation. Diuresed  10/10 cards consulted for HF recs  10/18 R/LHC without significant CAD, but had incr L heart, PA, and R heart pressures. Transferred to ICU, started on milronone> developed hypotension and NSVT> milrinone reduced and started norepi and amiodarone.  10/20 Transferred to [facility] 10/21 MSSA in blood cultures, A-line placed and removed overnight 10/24 pressors weaned off.  Transferred under [place] again on 02/05/2021    Assessment & Plan:   Principal Problem:   MSSA bacteremia Active Problems:   Cardiogenic shock ([place])   Central line infection   Pneumonia of both lower lobes due to methicillin  susceptible Staphylococcus aureus (MSSA) (HCC)   Hyponatremia   Acute on chronic combined systolic and diastolic congestive heart failure (HCC)   Acute on chronic respiratory failure with hypoxia and hypercapnia (HCC)  Shock - Cardiogenic/Septic/MSSA bacteremia - Initial presentation consistent with cardiogenic shock but now with septic overlay with PNA and MSSA bacteremia - Procalcitonin 0.8  Lactic Acid 1.2 . RHC cardiac index 1.5  - BCx on 01/30/2021, 2/2 MSSA. Urine Cx negative, A-line placed and removed overnight.  Blood cultures since then have been negative.  Once again central line removed on 02/04/2021 and blood cultures drawn.  ID managing antibiotics.  Currently on Ancef.   Fortunately, TEE was negative for any endocarditis, end date for cefazolin 03/09/2021.   Acute Systolic Heart Failure  - Echo EF now down from 55-->20-25%. Cath with no CAD, elevated filling pressures and low output heart failure.  - Off milrinone. Volume status ok.  -- No room for GDMT with hypotension.  Heart failure team signed off with recommendations to continue following medications. Torsemide 40 mg daily  Midodrine 10 mg tid  Amiodarone 200 mg daily Mexilitene 150 mg twice a day  Pravastatin 10 mg daily    Acute Hypoxic/Hypercarbia Respiratory Failure  - Improved with volume removal. Continue to wean oxygen as needed.  - Sats stable around 93% on 3 liters [place]. Continue wean as able.  Encouraged incentive spirometry.   NSVT, PVC  -High PVC burden On amio drip. PVCs burden reduced. Continue amio. Mexilitene added 10/26   Keep K> 4.0 Mg>2.0 - outpatient sleep study   AKI  - Creatinine peaked at 2.1 , now resolved.  Hyponatremia - Sodium improved, 130 today. - Restrict free water   Morbid Obesity   Body mass index is 42.48 kg/m.   AMS - ABC with CO2 retention, resolved, currently fully alert and oriented.   Suspected Sleep Apnea -will need outpatient sleep study.  PCCM recommended BiPAP  arrangement at SNF before discharging.  [facility] to provide [facility] but CPAP is possible.  Discussed with Dr. Silas Flood of PCCM today, who recommends trying CPAP here overnight, checking ABG tomorrow and then deciding whether we can discharge him on CPAP.   Hypothyroidism  Continue Synthroid.   Hypokalemia Resolved  DVT prophylaxis: Place TED hose Start: 02/03/21 0905 heparin injection 5,000 Units Start: 01/29/21 2200 SCDs Start: 01/29/21 2001   Code Status: Full Code  Family Communication: None at bedside, plan of care discussed with patient.  Status is: Inpatient  Remains inpatient appropriate because: Needs further work-up.  Estimated body mass index is 42.67 kg/m as calculated from the following:   Height as of this encounter: 5\' 10"  (1.778 m).   Weight as of this encounter: 134.9 kg.     Nutritional Assessment: Body mass index is 42.67 kg/m.Marland Kitchen Seen by dietician.  I agree with the assessment and plan as outlined below: Nutrition Status:  Skin Assessment: I have examined the patient's skin and I agree with the wound assessment as performed by the wound care RN as outlined below:    Consultants:  Cardiology and ID  Procedures:  As above  Antimicrobials:  Anti-infectives (From admission, onward)    Start     Dose/Rate Route Frequency Ordered Stop   02/02/21 1615  ceFAZolin (ANCEF) IVPB 2g/100 mL premix        2 g 200 mL/hr over 30 Minutes Intravenous Every 8 hours 02/02/21 1515 03/04/21 2359   02/01/21 0845  ceFEPIme (MAXIPIME) 2 g in sodium chloride 0.9 % 100 mL IVPB  Status:  Discontinued        2 g 200 mL/hr over 30 Minutes Intravenous Every 8 hours 02/01/21 0756 02/02/21 1515   01/30/21 1015  ceFAZolin (ANCEF) IVPB 2g/100 mL premix  Status:  Discontinued        2 g 200 mL/hr over 30 Minutes Intravenous Every 8 hours 01/30/21 0927 01/30/21 0930   01/29/21 2115  vancomycin (VANCOREADY) IVPB 1250 mg/250 mL  Status:   Discontinued        1,250 mg 166.7 mL/hr over 90 Minutes Intravenous Every 24 hours 01/29/21 2029 01/31/21 1048   01/29/21 2100  ceFEPIme (MAXIPIME) 2 g in sodium chloride 0.9 % 100 mL IVPB  Status:  Discontinued        2 g 200 mL/hr over 30 Minutes Intravenous Every 12 hours 01/29/21 2004 02/01/21 0756          Subjective:  Patient seen and examined.  He has no complaints.  Objective: Vitals:   02/07/21 0337 02/07/21 0612 02/07/21 0753 02/07/21 1143  BP: (!) 98/57  105/70 131/85  Pulse: 67  60 83  Resp: 17  20 20   Temp: 98.2 F (36.8 C)  97.8 F (36.6 C) 98.2 F (36.8 C)  TempSrc: Oral  Oral Oral  SpO2: 96%  95% 98%  Weight:  134.9 kg    Height:        Intake/Output Summary (Last 24 hours) at 02/07/2021 1346 Last data filed at 02/07/2021 0616 Gross per 24 hour  Intake --  Output 2300 ml  Net -2300 ml  Filed Weights   02/05/21 1109 02/06/21 0342 02/07/21 0612  Weight: 134.3 kg 134.3 kg 134.9 kg    Examination:  General exam: Appears calm and comfortable, morbidly obese Respiratory system: Clear to auscultation. Respiratory effort normal. Cardiovascular system: S1 & S2 heard, RRR. No JVD, murmurs, rubs, gallops or clicks.  +1 pitting edema bilateral lower extremity Gastrointestinal system: Abdomen is nondistended, soft and nontender. No organomegaly or masses felt. Normal bowel sounds heard. Central nervous system: Alert and oriented. No focal neurological deficits. Extremities: Symmetric 5 x 5 power. Skin: No rashes, lesions or ulcers.  Psychiatry: Judgement and insight appear normal. Mood & affect appropriate.    Data Reviewed: I have personally reviewed following labs and imaging studies  CBC: Recent Labs  Lab 02/02/21 0510 02/03/21 0517 02/05/21 0418 02/06/21 0219 02/07/21 0052  WBC 11.5* 13.5* 13.3* 11.0* 12.5*  NEUTROABS  --   --   --  8.0* 9.5*  HGB 13.4 13.0 13.5 13.3 13.0  HCT 39.5 38.5* 41.1 39.8 39.9  MCV 85.7 85.0 85.4 85.6 86.2   PLT 177 209 277 278 440    Basic Metabolic Panel: Recent Labs  Lab 02/01/21 0515 02/02/21 0510 02/03/21 0517 02/04/21 0435 02/05/21 0418 02/06/21 0219 02/07/21 0052  NA 129* 130* 127* 127* 127* 128* 130*  K 4.1 4.5 3.7 4.4 4.6 4.3 4.5  CL 82* 83* 82* 85* 90* 94* 94*  CO2 37* 39* 37* 33* 29 29 28   GLUCOSE 123* 114* 127* 100* 92 101* 97  BUN 71* 63* 54* 47* 40* 38* 36*  CREATININE 1.19 1.06 1.06 1.01 1.05 0.89 1.07  CALCIUM 8.5* 8.4* 8.3* 8.2* 8.7* 8.5* 8.6*  MG 2.1 2.2 2.0  --  2.2 2.3  --   PHOS 2.6 2.8 3.2  --  3.6  --   --     GFR: Estimated Creatinine Clearance: 81.2 mL/min (by C-G formula based on SCr of 1.07 mg/dL). Liver Function Tests: Recent Labs  Lab 02/01/21 0515 02/02/21 0510 02/03/21 0517 02/04/21 0435 02/05/21 0418  AST 24 29 28 29  33  ALT 27 26 25 20 17   ALKPHOS 75 71 83 84 93  BILITOT 1.4* 1.0 1.0 0.9 0.9  PROT 5.8* 5.8* 5.7* 5.7* 6.0*  ALBUMIN 2.3* 2.2* 2.1* 2.1* 2.4*    No results for input(s): LIPASE, AMYLASE in the last 168 hours. No results for input(s): AMMONIA in the last 168 hours.  Coagulation Profile: No results for input(s): INR, PROTIME in the last 168 hours. Cardiac Enzymes: No results for input(s): CKTOTAL, CKMB, CKMBINDEX, TROPONINI in the last 168 hours. BNP (last 3 results) No results for input(s): PROBNP in the last 8760 hours. HbA1C: No results for input(s): HGBA1C in the last 72 hours. CBG: Recent Labs  Lab 02/06/21 1114 02/06/21 1636 02/06/21 2100 02/07/21 0618 02/07/21 1145  GLUCAP 216* 76 109* 113* 170*    Lipid Profile: No results for input(s): CHOL, HDL, LDLCALC, TRIG, CHOLHDL, LDLDIRECT in the last 72 hours. Thyroid Function Tests: No results for input(s): TSH, T4TOTAL, FREET4, T3FREE, THYROIDAB in the last 72 hours. Anemia Panel: No results for input(s): VITAMINB12, FOLATE, FERRITIN, TIBC, IRON, RETICCTPCT in the last 72 hours. Sepsis Labs: No results for input(s): PROCALCITON, LATICACIDVEN in the last  168 hours.   Recent Results (from the past 240 hour(s))  CULTURE, BLOOD (ROUTINE X 2) w Reflex to ID Panel     Status: Abnormal   Collection Time: 01/29/21  2:00 PM   Specimen: BLOOD  Result Value Ref  Range Status   Specimen Description   Final    BLOOD LEFT ANTECUBITAL Performed at [facility], [address], [place], [place] [address]    Special Requests   Final    BOTTLES DRAWN AEROBIC AND ANAEROBIC Blood Culture adequate volume Performed at [facility], [address] [place], [place] [address]    Culture  Setup Time   Final    IN BOTH AEROBIC AND ANAEROBIC BOTTLES GRAM POSITIVE COCCI CRITICAL RESULT CALLED TO, READ BACK BY AND VERIFIED WITH: GREGG ABBOTT @0345  ON 01/30/21 SKL Performed at [facility], [place] [address] [place], [place] [address]    Culture STAPHYLOCOCCUS AUREUS (A)  Final   Report Status 02/01/2021 FINAL  Final   Organism ID, Bacteria STAPHYLOCOCCUS AUREUS  Final      Susceptibility   Staphylococcus aureus - MIC*    CIPROFLOXACIN <=0.5 SENSITIVE Sensitive     ERYTHROMYCIN >=8 RESISTANT Resistant     GENTAMICIN <=0.5 SENSITIVE Sensitive     OXACILLIN <=0.25 SENSITIVE Sensitive     TETRACYCLINE <=1 SENSITIVE Sensitive     VANCOMYCIN <=0.5 SENSITIVE Sensitive     TRIMETH/SULFA <=10 SENSITIVE Sensitive     CLINDAMYCIN RESISTANT Resistant     RIFAMPIN <=0.5 SENSITIVE Sensitive     Inducible Clindamycin POSITIVE Resistant     * STAPHYLOCOCCUS AUREUS  Blood Culture ID Panel (Reflexed)     Status: Abnormal   Collection Time: 01/29/21  2:00 PM  Result Value Ref Range Status   Enterococcus faecalis NOT DETECTED NOT DETECTED Final   Enterococcus Faecium NOT DETECTED NOT DETECTED Final   Listeria monocytogenes NOT DETECTED NOT DETECTED Final   Staphylococcus species DETECTED (A) NOT DETECTED Final    Comment: CRITICAL RESULT CALLED TO, READ BACK BY AND VERIFIED WITH: GREGG ABBOTT @0345  ON 01/30/21 SKL    Staphylococcus aureus  (BCID) DETECTED (A) NOT DETECTED Final    Comment: CRITICAL RESULT CALLED TO, READ BACK BY AND VERIFIED WITH: GREGG ABBOTT @0345  ON 01/30/21 SKL    Staphylococcus epidermidis NOT DETECTED NOT DETECTED Final   Staphylococcus lugdunensis NOT DETECTED NOT DETECTED Final   Streptococcus species NOT DETECTED NOT DETECTED Final   Streptococcus agalactiae NOT DETECTED NOT DETECTED Final   Streptococcus pneumoniae NOT DETECTED NOT DETECTED Final   Streptococcus pyogenes NOT DETECTED NOT DETECTED Final   A.calcoaceticus-baumannii NOT DETECTED NOT DETECTED Final   Bacteroides fragilis NOT DETECTED NOT DETECTED Final   Enterobacterales NOT DETECTED NOT DETECTED Final   Enterobacter cloacae complex NOT DETECTED NOT DETECTED Final   Escherichia coli NOT DETECTED NOT DETECTED Final   Klebsiella aerogenes NOT DETECTED NOT DETECTED Final   Klebsiella oxytoca NOT DETECTED NOT DETECTED Final   Klebsiella pneumoniae NOT DETECTED NOT DETECTED Final   Proteus species NOT DETECTED NOT DETECTED Final   Salmonella species NOT DETECTED NOT DETECTED Final   Serratia marcescens NOT DETECTED NOT DETECTED Final   Haemophilus influenzae NOT DETECTED NOT DETECTED Final   Neisseria meningitidis NOT DETECTED NOT DETECTED Final   Pseudomonas aeruginosa NOT DETECTED NOT DETECTED Final   Stenotrophomonas maltophilia NOT DETECTED NOT DETECTED Final   Candida albicans NOT DETECTED NOT DETECTED Final   Candida auris NOT DETECTED NOT DETECTED Final   Candida glabrata NOT DETECTED NOT DETECTED Final   Candida krusei NOT DETECTED NOT DETECTED Final   Candida parapsilosis NOT DETECTED NOT DETECTED Final   Candida tropicalis NOT DETECTED NOT DETECTED Final   Cryptococcus neoformans/gattii NOT DETECTED NOT DETECTED Final  Meth resistant mecA/C and MREJ NOT DETECTED NOT DETECTED Final    Comment: Performed at [facility], [address] [place], [place] [address]  CULTURE, BLOOD (ROUTINE X 2) w Reflex to ID Panel      Status: Abnormal   Collection Time: 01/29/21  2:03 PM   Specimen: BLOOD  Result Value Ref Range Status   Specimen Description   Final    BLOOD BLOOD LEFT HAND Performed at [facility], [address], [place], [place] [address]    Special Requests   Final    BOTTLES DRAWN AEROBIC AND ANAEROBIC Blood Culture adequate volume Performed at [facility], [address] [place], [place] [address]    Culture  Setup Time   Final    IN BOTH AEROBIC AND ANAEROBIC BOTTLES GRAM POSITIVE COCCI CRITICAL VALUE NOTED.  VALUE IS CONSISTENT WITH PREVIOUSLY REPORTED AND CALLED VALUE. Performed at [facility], [address] [place], [place] [address]    Culture (A)  Final    STAPHYLOCOCCUS AUREUS SUSCEPTIBILITIES PERFORMED ON PREVIOUS CULTURE WITHIN THE LAST 5 DAYS. Performed at [facility], [place] [address], [place], [place] [address]    Report Status 02/01/2021 FINAL  Final  Urine Culture     Status: None   Collection Time: 01/29/21  8:38 PM   Specimen: Urine, Catheterized  Result Value Ref Range Status   Specimen Description URINE, CATHETERIZED  Final   Special Requests NONE  Final   Culture   Final    NO GROWTH Performed at [facility], [place] [address], [place], [place] [address]    Report Status 01/31/2021 FINAL  Final  Expectorated Sputum Assessment w Gram Stain, Rflx to Resp Cult     Status: None   Collection Time: 01/29/21  8:38 PM   Specimen: Sputum  Result Value Ref Range Status   Specimen Description SPUTUM  Final   Special Requests NONE  Final   Sputum evaluation   Final    Sputum specimen not acceptable for testing.  Please recollect.   RESULT CALLED TO, READ BACK BY AND VERIFIED WITH: RN KOBE 01/30/21@00 :36 Performed at [facility], [place] [address], [place], [place] [address]    Report Status 01/31/2021 FINAL  Final  Expectorated Sputum Assessment w Gram Stain, Rflx to Resp Cult     Status: None   Collection Time: 01/30/21   9:04 AM   Specimen: Sputum  Result Value Ref Range Status   Specimen Description SPUTUM  Final   Special Requests  EXPECTORATED  Final   Sputum evaluation   Final    THIS SPECIMEN IS ACCEPTABLE FOR SPUTUM CULTURE Performed at [facility], [place] [address], [place], [place] [address]    Report Status 01/30/2021 FINAL  Final  Culture, Respiratory w Gram Stain     Status: None   Collection Time: 01/30/21  9:04 AM   Specimen: SPU  Result Value Ref Range Status   Specimen Description SPUTUM  Final   Special Requests  EXPECTORATED Reflexed from [id number]  Final   Gram Stain   Final    FEW SQUAMOUS EPITHELIAL CELLS PRESENT FEW WBC SEEN FEW GRAM POSITIVE COCCI    Culture   Final    FEW Consistent with normal respiratory flora. No Pseudomonas species isolated Performed at [place] [address] [place],  [address]    Report Status 02/02/2021 FINAL  Final  Culture, blood (routine x 2)  Status: None   Collection Time: 01/30/21 10:08 AM   Specimen: BLOOD RIGHT HAND  Result Value Ref Range Status   Specimen Description BLOOD RIGHT HAND  Final   Special Requests   Final    BOTTLES DRAWN AEROBIC AND ANAEROBIC Blood Culture adequate volume   Culture   Final    NO GROWTH 5 DAYS Performed at [facility], [address] [place], [place] [address]    Report Status 02/04/2021 FINAL  Final  Culture, blood (routine x 2)     Status: None   Collection Time: 01/30/21 10:09 AM   Specimen: BLOOD LEFT HAND  Result Value Ref Range Status   Specimen Description BLOOD LEFT HAND  Final   Special Requests   Final    BOTTLES DRAWN AEROBIC AND ANAEROBIC Blood Culture adequate volume   Culture   Final    NO GROWTH 5 DAYS Performed at [facility], [place] [address], [place], [place] [address]    Report Status 02/04/2021 FINAL  Final  MRSA Next Gen by PCR, Nasal     Status: None   Collection Time: 01/30/21 11:01 AM   Specimen: Nasal Mucosa; Nasal Swab  Result  Value Ref Range Status   MRSA by PCR Next Gen NOT DETECTED NOT DETECTED Final    Comment: (NOTE) The GeneXpert MRSA Assay (FDA approved for NASAL specimens only), is one component of a comprehensive MRSA colonization surveillance program. It is not intended to diagnose MRSA infection nor to guide or monitor treatment for MRSA infections. Test performance is not FDA approved in patients less than 14 years old. Performed at [facility], [place] [address] [place], [place] [address]   Culture, blood (routine x 2)     Status: None   Collection Time: 01/31/21  6:08 AM   Specimen: BLOOD RIGHT HAND  Result Value Ref Range Status   Specimen Description BLOOD RIGHT HAND  Final   Special Requests   Final    AEROBIC BOTTLE ONLY Blood Culture results may not be optimal due to an inadequate volume of blood received in culture bottles   Culture   Final    NO GROWTH 5 DAYS Performed at [facility], [place] [address], [place], [place] [address]    Report Status 02/05/2021 FINAL  Final  Culture, blood (routine x 2)     Status: None   Collection Time: 01/31/21  3:06 PM   Specimen: BLOOD  Result Value Ref Range Status   Specimen Description BLOOD SITE NOT SPECIFIED  Final   Special Requests   Final    BOTTLES DRAWN AEROBIC ONLY Blood Culture results may not be optimal due to an inadequate volume of blood received in culture bottles   Culture   Final    NO GROWTH 5 DAYS Performed at [facility], [place] [address], [place], [place] [address]    Report Status 02/05/2021 FINAL  Final  Culture, blood (routine x 2)     Status: None (Preliminary result)   Collection Time: 02/04/21 12:56 PM   Specimen: BLOOD  Result Value Ref Range Status   Specimen Description BLOOD LEFT ANTECUBITAL  Final   Special Requests   Final    BOTTLES DRAWN AEROBIC AND ANAEROBIC Blood Culture adequate volume   Culture   Final    NO GROWTH 3 DAYS Performed at [facility], [place] [address] [place],   [address]    Report Status PENDING  Incomplete  Culture, blood (routine x 2)     Status: None (Preliminary result)   Collection Time: 02/04/21  1:03 PM   Specimen: BLOOD  Result Value Ref Range Status   Specimen Description BLOOD LEFT ANTECUBITAL  Final   Special Requests   Final    BOTTLES DRAWN AEROBIC AND ANAEROBIC Blood Culture adequate volume   Culture   Final    NO GROWTH 3 DAYS Performed at [facility], [address] [place], [place] [address]    Report Status PENDING  Incomplete       Radiology Studies: No results found.  Scheduled Meds:  amiodarone  200 mg Oral BID   Chlorhexidine Gluconate Cloth  6 each Topical Daily   heparin  5,000 Units Subcutaneous Q8H   insulin aspart  0-15 Units Subcutaneous TID WC   levothyroxine  50 mcg Oral Q0600   mexiletine  150 mg Oral Q12H   midodrine  10 mg Oral TID WC   polyethylene glycol  17 g Oral BID   pravastatin  10 mg Oral q1800   senna-docusate  1 tablet Oral BID   torsemide  40 mg Oral Daily   Continuous Infusions:   ceFAZolin (ANCEF) IV 2 g (02/07/21 0647)     LOS: 9 days   Time spent: 28 minutes   Darliss Cheney, MD [facility]  02/07/2021, 1:46 PM  Please page via [facility] and do not message via secure chat for anything urgent. Secure chat can be used for anything non urgent.  How to contact the [facility] Attending or Consulting provider [place] or covering provider during after hours [place], for this patient?  Check the care team in [facility] and look for a) attending/consulting TR[facility] provider listed and b) the [facility] team listed. Page or secure chat [phone]. Log into [email or web address] and use [place]'s universal password to access. If you do not have the password, please contact the hospital operator. Locate the [facility] provider you are looking for under [facility] and page to a number that you can be directly reached. If you still have difficulty reaching the provider, please page the [facility] (Director on Call) for  the Hospitalists listed on amion for assistance.

## 2021-02-08 LAB — GLUCOSE, CAPILLARY
Glucose-Capillary: 104 mg/dL — ABNORMAL HIGH (ref 70–99)
Glucose-Capillary: 137 mg/dL — ABNORMAL HIGH (ref 70–99)
Glucose-Capillary: 109 mg/dL — ABNORMAL HIGH (ref 70–99)
Glucose-Capillary: 124 mg/dL — ABNORMAL HIGH (ref 70–99)
Glucose-Capillary: 127 mg/dL — ABNORMAL HIGH (ref 70–99)

## 2021-02-08 LAB — BASIC METABOLIC PANEL
Anion gap: 9 (ref 5–15)
BUN: 29 mg/dL — ABNORMAL HIGH (ref 8–23)
CO2: 25 mmol/L (ref 22–32)
Calcium: 8.7 mg/dL — ABNORMAL LOW (ref 8.9–10.3)
Chloride: 96 mmol/L — ABNORMAL LOW (ref 98–111)
Creatinine, Ser: 0.97 mg/dL (ref 0.61–1.24)
GFR, Estimated: >60 mL/min (ref >60)
Glucose, Bld: 90 mg/dL (ref 70–99)
Potassium: 4.6 mmol/L (ref 3.5–5.1)
Sodium: 130 mmol/L — ABNORMAL LOW (ref 135–145)

## 2021-02-08 LAB — BLOOD GAS, ARTERIAL
Acid-Base Excess: 5.1 mmol/L — ABNORMAL HIGH (ref 0.0–2.0)
Bicarbonate: 29.3 mmol/L — ABNORMAL HIGH (ref 20.0–28.0)
Drawn by: 336832
FIO2: 26
O2 Saturation: 96.9 %
Patient temperature: 37
pCO2 arterial: 45.1 mmHg (ref 32.0–48.0)
pH, Arterial: 7.428 (ref 7.350–7.450)
pO2, Arterial: 83.9 mmHg (ref 83.0–108.0)

## 2021-02-08 NOTE — Progress Notes (Signed)
PROGRESS NOTE    Anthony Weber  WCH:[id number] DOB: [birth year] DOA: 01/29/2021 PCP: Albina Billet, MD   Brief Narrative:  75 yo M PMH morbid obesity, HTN, HLD, hypothyroidism, HFrEF who presented to [facility] ED 10/9 with CC BLE swelling. He reportedly gained 15lb over 1wk prior to presentation, had incr DOE. He was admitted to hospitalist service for acute heart failure exacerbation. Cardiology was consulted 10/10 and recommended diuresis, lisonopril and repeat ECHO which revealed LVEF 25-30% with indeterminate LV diastolic parameters and poorly visualized RV systolic function.  He received ongoing medication optimization and went for [facility] 10/18 which showed no significant CAD, moderately elevated L heart and PA pressure, severely elevated R Heart pressures. He was transferred to the ICU 10/18 and started on a milrinone infusion for cardiogenic shock.   On 10/20 the pt was started on a lasix gtt for hypoxia in setting of cardiogenic shock and a plan was made to transfer the patient to [facility] for further management and care.  He was admitted to ICU and eventually was transitioned to [facility] care on 02/05/2021.  Significant Hospital Events: Including procedures, antibiotic start and stop dates in addition to other pertinent events   10/9 admitted to [facility] with likely HF exacerbation. Diuresed  10/10 cards consulted for HF recs  10/18 R/LHC without significant CAD, but had incr L heart, PA, and R heart pressures. Transferred to ICU, started on milronone> developed hypotension and NSVT> milrinone reduced and started norepi and amiodarone.  10/20 Transferred to [facility] 10/21 MSSA in blood cultures, A-line placed and removed overnight 10/24 pressors weaned off.  Transferred under [facility] again on 02/05/2021    Assessment & Plan:   Principal Problem:   MSSA bacteremia Active Problems:   Cardiogenic shock ([place])   Central line infection   Pneumonia of both lower lobes due to methicillin  susceptible Staphylococcus aureus (MSSA) (HCC)   Hyponatremia   Acute on chronic combined systolic and diastolic congestive heart failure (HCC)   Acute on chronic respiratory failure with hypoxia and hypercapnia (HCC)  Shock - Cardiogenic/Septic/MSSA bacteremia - Initial presentation consistent with cardiogenic shock but now with septic overlay with PNA and MSSA bacteremia - Procalcitonin 0.8  Lactic Acid 1.2 . RHC cardiac index 1.5  - BCx on 01/30/2021, 2/2 MSSA. Urine Cx negative, A-line placed and removed overnight.  Blood cultures since then have been negative.  Once again central line removed on 02/04/2021 and blood cultures drawn.  ID managing antibiotics.  Currently on Ancef.   Fortunately, TEE was negative for any endocarditis, end date for cefazolin 03/09/2021.   Acute Systolic Heart Failure  - Echo EF now down from 55-->20-25%. Cath with no CAD, elevated filling pressures and low output heart failure.  - Off milrinone. Volume status ok.  -- No room for GDMT with hypotension.  Heart failure team signed off with recommendations to continue following medications. Torsemide 40 mg daily  Midodrine 10 mg tid  Amiodarone 200 mg daily Mexilitene 150 mg twice a day  Pravastatin 10 mg daily    Acute Hypoxic/Hypercarbia Respiratory Failure  - Improved with volume removal. Continue to wean oxygen as needed.  - Sats stable around 93% on 3 liters [place]. Continue wean as able.  Encouraged incentive spirometry.   NSVT, PVC  -High PVC burden On amio drip. PVCs burden reduced. Continue amio. Mexilitene added 10/26   Keep K> 4.0 Mg>2.0 - outpatient sleep study   AKI  - Creatinine peaked at 2.1 , now resolved.  Hyponatremia - Sodium improved, 130 today. - Restrict free water   Morbid Obesity   Body mass index is 42.48 kg/m.   AMS - ABC with CO2 retention, resolved, currently fully alert and oriented.   Suspected Sleep Apnea -will need outpatient sleep study.  PCCM recommended BiPAP  arrangement at SNF before discharging.  [facility] but CPAP is possible.  Discussed with Dr. Silas Flood of PCCM yesterday and he recommended giving a trial of CPAP here overnight.  Reportedly, patient was able to wear CPAP machine for 5 hours overnight.  ABG repeated show improvement.  Awaiting PCCM to provide the recommendations but in my opinion, patient can possibly use CPAP as well until BiPAP is arranged.   Hypothyroidism  Continue Synthroid.   Hypokalemia Resolved  DVT prophylaxis: Place TED hose Start: 02/03/21 0905 heparin injection 5,000 Units Start: 01/29/21 2200 SCDs Start: 01/29/21 2001   Code Status: Full Code  Family Communication: None at bedside, plan of care discussed with patient.  Status is: Inpatient  Remains inpatient appropriate because: Needs further work-up.  Estimated body mass index is 42.42 kg/m as calculated from the following:   Height as of this encounter: 5\' 10"  (1.778 m).   Weight as of this encounter: 134.1 kg.     Nutritional Assessment: Body mass index is 42.42 kg/m.Marland Kitchen Seen by dietician.  I agree with the assessment and plan as outlined below: Nutrition Status:  Skin Assessment: I have examined the patient's skin and I agree with the wound assessment as performed by the wound care RN as outlined below:    Consultants:  Cardiology and ID  Procedures:  As above  Antimicrobials:  Anti-infectives (From admission, onward)    Start     Dose/Rate Route Frequency Ordered Stop   02/02/21 1615  ceFAZolin (ANCEF) IVPB 2g/100 mL premix        2 g 200 mL/hr over 30 Minutes Intravenous Every 8 hours 02/02/21 1515 03/04/21 2359   02/01/21 0845  ceFEPIme (MAXIPIME) 2 g in sodium chloride 0.9 % 100 mL IVPB  Status:  Discontinued        2 g 200 mL/hr over 30 Minutes Intravenous Every 8 hours 02/01/21 0756 02/02/21 1515   01/30/21 1015  ceFAZolin (ANCEF) IVPB 2g/100 mL premix  Status:   Discontinued        2 g 200 mL/hr over 30 Minutes Intravenous Every 8 hours 01/30/21 0927 01/30/21 0930   01/29/21 2115  vancomycin (VANCOREADY) IVPB 1250 mg/250 mL  Status:  Discontinued        1,250 mg 166.7 mL/hr over 90 Minutes Intravenous Every 24 hours 01/29/21 2029 01/31/21 1048   01/29/21 2100  ceFEPIme (MAXIPIME) 2 g in sodium chloride 0.9 % 100 mL IVPB  Status:  Discontinued        2 g 200 mL/hr over 30 Minutes Intravenous Every 12 hours 01/29/21 2004 02/01/21 0756          Subjective:  Patient seen and examined.  He has no complaints.  He was happy that he was able to tolerate CPAP for 5 hours last night.  Objective: Vitals:   02/08/21 0400 02/08/21 0530 02/08/21 0727 02/08/21 1201  BP:   (!) 110/59 105/74  Pulse: 68 67 65 81  Resp: 14 18 16 18   Temp:   97.7 F (36.5 C) 97.7 F (36.5 C)  TempSrc:   Oral Oral  SpO2: 95% 96% 97% 98%  Weight:  Height:        Intake/Output Summary (Last 24 hours) at 02/08/2021 1235 Last data filed at 02/08/2021 0727 Gross per 24 hour  Intake 120 ml  Output 3000 ml  Net -2880 ml    Filed Weights   02/06/21 0342 02/07/21 0612 02/08/21 0355  Weight: 134.3 kg 134.9 kg 134.1 kg    Examination:  General exam: Appears calm and comfortable, morbidly obese Respiratory system: Clear to auscultation. Respiratory effort normal. Cardiovascular system: S1 & S2 heard, RRR. No JVD, murmurs, rubs, gallops or clicks. No pedal edema. Gastrointestinal system: Abdomen is nondistended, soft and nontender. No organomegaly or masses felt. Normal bowel sounds heard. Central nervous system: Alert and oriented. No focal neurological deficits. Extremities: Symmetric 5 x 5 power. Skin: No rashes, lesions or ulcers.  Psychiatry: Judgement and insight appear normal. Mood & affect appropriate.   Data Reviewed: I have personally reviewed following labs and imaging studies  CBC: Recent Labs  Lab 02/02/21 0510 02/03/21 0517 02/05/21 0418  02/06/21 0219 02/07/21 0052  WBC 11.5* 13.5* 13.3* 11.0* 12.5*  NEUTROABS  --   --   --  8.0* 9.5*  HGB 13.4 13.0 13.5 13.3 13.0  HCT 39.5 38.5* 41.1 39.8 39.9  MCV 85.7 85.0 85.4 85.6 86.2  PLT 177 209 277 278 353    Basic Metabolic Panel: Recent Labs  Lab 02/02/21 0510 02/03/21 0517 02/04/21 0435 02/05/21 0418 02/06/21 0219 02/07/21 0052 02/08/21 0452  NA 130* 127* 127* 127* 128* 130* 130*  K 4.5 3.7 4.4 4.6 4.3 4.5 4.6  CL 83* 82* 85* 90* 94* 94* 96*  CO2 39* 37* 33* 29 29 28 25   GLUCOSE 114* 127* 100* 92 101* 97 90  BUN 63* 54* 47* 40* 38* 36* 29*  CREATININE 1.06 1.06 1.01 1.05 0.89 1.07 0.97  CALCIUM 8.4* 8.3* 8.2* 8.7* 8.5* 8.6* 8.7*  MG 2.2 2.0  --  2.2 2.3  --   --   PHOS 2.8 3.2  --  3.6  --   --   --     GFR: Estimated Creatinine Clearance: 89.3 mL/min (by C-G formula based on SCr of 0.97 mg/dL). Liver Function Tests: Recent Labs  Lab 02/02/21 0510 02/03/21 0517 02/04/21 0435 02/05/21 0418  AST 29 28 29  33  ALT 26 25 20 17   ALKPHOS 71 83 84 93  BILITOT 1.0 1.0 0.9 0.9  PROT 5.8* 5.7* 5.7* 6.0*  ALBUMIN 2.2* 2.1* 2.1* 2.4*    No results for input(s): LIPASE, AMYLASE in the last 168 hours. No results for input(s): AMMONIA in the last 168 hours.  Coagulation Profile: No results for input(s): INR, PROTIME in the last 168 hours. Cardiac Enzymes: No results for input(s): CKTOTAL, CKMB, CKMBINDEX, TROPONINI in the last 168 hours. BNP (last 3 results) No results for input(s): PROBNP in the last 8760 hours. HbA1C: No results for input(s): HGBA1C in the last 72 hours. CBG: Recent Labs  Lab 02/07/21 1145 02/07/21 1852 02/07/21 2047 02/08/21 0624 02/08/21 1216  GLUCAP 170* 149* 89 104* 137*    Lipid Profile: No results for input(s): CHOL, HDL, LDLCALC, TRIG, CHOLHDL, LDLDIRECT in the last 72 hours. Thyroid Function Tests: No results for input(s): TSH, T4TOTAL, FREET4, T3FREE, THYROIDAB in the last 72 hours. Anemia Panel: No results for  input(s): VITAMINB12, FOLATE, FERRITIN, TIBC, IRON, RETICCTPCT in the last 72 hours. Sepsis Labs: No results for input(s): PROCALCITON, LATICACIDVEN in the last 168 hours.   Recent Results (from the past 240 hour(s))  CULTURE, BLOOD (ROUTINE X 2) w Reflex to ID Panel     Status: Abnormal   Collection Time: 01/29/21  2:00 PM   Specimen: BLOOD  Result Value Ref Range Status   Specimen Description   Final    BLOOD LEFT ANTECUBITAL Performed at [facility], [address] [place], [place] [address]    Special Requests   Final    BOTTLES DRAWN AEROBIC AND ANAEROBIC Blood Culture adequate volume Performed at [facility], [address] [place], [place] [address]    Culture  Setup Time   Final    IN BOTH AEROBIC AND ANAEROBIC BOTTLES GRAM POSITIVE COCCI CRITICAL RESULT CALLED TO, READ BACK BY AND VERIFIED WITH: GREGG ABBOTT @0345  ON 01/30/21 SKL Performed at [facility], [place] [address] [place], [place] [address]    Culture STAPHYLOCOCCUS AUREUS (A)  Final   Report Status 02/01/2021 FINAL  Final   Organism ID, Bacteria STAPHYLOCOCCUS AUREUS  Final      Susceptibility   Staphylococcus aureus - MIC*    CIPROFLOXACIN <=0.5 SENSITIVE Sensitive     ERYTHROMYCIN >=8 RESISTANT Resistant     GENTAMICIN <=0.5 SENSITIVE Sensitive     OXACILLIN <=0.25 SENSITIVE Sensitive     TETRACYCLINE <=1 SENSITIVE Sensitive     VANCOMYCIN <=0.5 SENSITIVE Sensitive     TRIMETH/SULFA <=10 SENSITIVE Sensitive     CLINDAMYCIN RESISTANT Resistant     RIFAMPIN <=0.5 SENSITIVE Sensitive     Inducible Clindamycin POSITIVE Resistant     * STAPHYLOCOCCUS AUREUS  Blood Culture ID Panel (Reflexed)     Status: Abnormal   Collection Time: 01/29/21  2:00 PM  Result Value Ref Range Status   Enterococcus faecalis NOT DETECTED NOT DETECTED Final   Enterococcus Faecium NOT DETECTED NOT DETECTED Final   Listeria monocytogenes NOT DETECTED NOT DETECTED Final   Staphylococcus species  DETECTED (A) NOT DETECTED Final    Comment: CRITICAL RESULT CALLED TO, READ BACK BY AND VERIFIED WITH: GREGG ABBOTT @0345  ON 01/30/21 SKL    Staphylococcus aureus (BCID) DETECTED (A) NOT DETECTED Final    Comment: CRITICAL RESULT CALLED TO, READ BACK BY AND VERIFIED WITH: GREGG ABBOTT @0345  ON 01/30/21 SKL    Staphylococcus epidermidis NOT DETECTED NOT DETECTED Final   Staphylococcus lugdunensis NOT DETECTED NOT DETECTED Final   Streptococcus species NOT DETECTED NOT DETECTED Final   Streptococcus agalactiae NOT DETECTED NOT DETECTED Final   Streptococcus pneumoniae NOT DETECTED NOT DETECTED Final   Streptococcus pyogenes NOT DETECTED NOT DETECTED Final   A.calcoaceticus-baumannii NOT DETECTED NOT DETECTED Final   Bacteroides fragilis NOT DETECTED NOT DETECTED Final   Enterobacterales NOT DETECTED NOT DETECTED Final   Enterobacter cloacae complex NOT DETECTED NOT DETECTED Final   Escherichia coli NOT DETECTED NOT DETECTED Final   Klebsiella aerogenes NOT DETECTED NOT DETECTED Final   Klebsiella oxytoca NOT DETECTED NOT DETECTED Final   Klebsiella pneumoniae NOT DETECTED NOT DETECTED Final   Proteus species NOT DETECTED NOT DETECTED Final   Salmonella species NOT DETECTED NOT DETECTED Final   Serratia marcescens NOT DETECTED NOT DETECTED Final   Haemophilus influenzae NOT DETECTED NOT DETECTED Final   Neisseria meningitidis NOT DETECTED NOT DETECTED Final   Pseudomonas aeruginosa NOT DETECTED NOT DETECTED Final   Stenotrophomonas maltophilia NOT DETECTED NOT DETECTED Final   Candida albicans NOT DETECTED NOT DETECTED Final   Candida auris NOT DETECTED NOT DETECTED Final   Candida glabrata NOT DETECTED NOT DETECTED Final   Candida krusei NOT  DETECTED NOT DETECTED Final   Candida parapsilosis NOT DETECTED NOT DETECTED Final   Candida tropicalis NOT DETECTED NOT DETECTED Final   Cryptococcus neoformans/gattii NOT DETECTED NOT DETECTED Final   Meth resistant mecA/C and MREJ NOT  DETECTED NOT DETECTED Final    Comment: Performed at [facility], [address] [place], [place] [address]  CULTURE, BLOOD (ROUTINE X 2) w Reflex to ID Panel     Status: Abnormal   Collection Time: 01/29/21  2:03 PM   Specimen: BLOOD  Result Value Ref Range Status   Specimen Description   Final    BLOOD BLOOD LEFT HAND Performed at [facility], [address] [place], [place] [address]    Special Requests   Final    BOTTLES DRAWN AEROBIC AND ANAEROBIC Blood Culture adequate volume Performed at [facility], [address] [place], [place] [address]    Culture  Setup Time   Final    IN BOTH AEROBIC AND ANAEROBIC BOTTLES GRAM POSITIVE COCCI CRITICAL VALUE NOTED.  VALUE IS CONSISTENT WITH PREVIOUSLY REPORTED AND CALLED VALUE. Performed at [facility], [address] [place], [place] [address]    Culture (A)  Final    STAPHYLOCOCCUS AUREUS SUSCEPTIBILITIES PERFORMED ON PREVIOUS CULTURE WITHIN THE LAST 5 DAYS. Performed at [facility], [place] [address] [place], [place] [address]    Report Status 02/01/2021 FINAL  Final  Urine Culture     Status: None   Collection Time: 01/29/21  8:38 PM   Specimen: Urine, Catheterized  Result Value Ref Range Status   Specimen Description URINE, CATHETERIZED  Final   Special Requests NONE  Final   Culture   Final    NO GROWTH Performed at [facility], [place] [address] [place], [place] [address]    Report Status 01/31/2021 FINAL  Final  Expectorated Sputum Assessment w Gram Stain, Rflx to Resp Cult     Status: None   Collection Time: 01/29/21  8:38 PM   Specimen: Sputum  Result Value Ref Range Status   Specimen Description SPUTUM  Final   Special Requests NONE  Final   Sputum evaluation   Final    Sputum specimen not acceptable for testing.  Please recollect.   RESULT CALLED TO, READ BACK BY AND VERIFIED WITH: RN KOBE 01/30/21@00 :36 Performed at [facility], [place] [address],  [place], [place] [address]    Report Status 01/31/2021 FINAL  Final  Expectorated Sputum Assessment w Gram Stain, Rflx to Resp Cult     Status: None   Collection Time: 01/30/21  9:04 AM   Specimen: Sputum  Result Value Ref Range Status   Specimen Description SPUTUM  Final   Special Requests  EXPECTORATED  Final   Sputum evaluation   Final    THIS SPECIMEN IS ACCEPTABLE FOR SPUTUM CULTURE Performed at [facility], [place] [address] [place], [place] [address]    Report Status 01/30/2021 FINAL  Final  Culture, Respiratory w Gram Stain     Status: None   Collection Time: 01/30/21  9:04 AM   Specimen: SPU  Result Value Ref Range Status   Specimen Description SPUTUM  Final   Special Requests  EXPECTORATED Reflexed from [id number]  Final   Gram Stain   Final    FEW SQUAMOUS EPITHELIAL CELLS PRESENT FEW WBC SEEN FEW GRAM POSITIVE COCCI    Culture   Final    FEW Consistent with normal respiratory flora.  No Pseudomonas species isolated Performed at [facility] [address], [place], [place] [address]    Report Status 02/02/2021 FINAL  Final  Culture, blood (routine x 2)     Status: None   Collection Time: 01/30/21 10:08 AM   Specimen: BLOOD RIGHT HAND  Result Value Ref Range Status   Specimen Description BLOOD RIGHT HAND  Final   Special Requests   Final    BOTTLES DRAWN AEROBIC AND ANAEROBIC Blood Culture adequate volume   Culture   Final    NO GROWTH 5 DAYS Performed at [facility], [place] [address] [place], [place] [address]    Report Status 02/04/2021 FINAL  Final  Culture, blood (routine x 2)     Status: None   Collection Time: 01/30/21 10:09 AM   Specimen: BLOOD LEFT HAND  Result Value Ref Range Status   Specimen Description BLOOD LEFT HAND  Final   Special Requests   Final    BOTTLES DRAWN AEROBIC AND ANAEROBIC Blood Culture adequate volume   Culture   Final    NO GROWTH 5 DAYS Performed at [facility], [place] [address], [place], [place] [address]     Report Status 02/04/2021 FINAL  Final  MRSA Next Gen by PCR, Nasal     Status: None   Collection Time: 01/30/21 11:01 AM   Specimen: Nasal Mucosa; Nasal Swab  Result Value Ref Range Status   MRSA by PCR Next Gen NOT DETECTED NOT DETECTED Final    Comment: (NOTE) The GeneXpert MRSA Assay (FDA approved for NASAL specimens only), is one component of a comprehensive MRSA colonization surveillance program. It is not intended to diagnose MRSA infection nor to guide or monitor treatment for MRSA infections. Test performance is not FDA approved in patients less than 7 years old. Performed at [facility], [place] [address], [place], [place] [address]   Culture, blood (routine x 2)     Status: None   Collection Time: 01/31/21  6:08 AM   Specimen: BLOOD RIGHT HAND  Result Value Ref Range Status   Specimen Description BLOOD RIGHT HAND  Final   Special Requests   Final    AEROBIC BOTTLE ONLY Blood Culture results may not be optimal due to an inadequate volume of blood received in culture bottles   Culture   Final    NO GROWTH 5 DAYS Performed at [facility], [place] [address], [place], [place] [address]    Report Status 02/05/2021 FINAL  Final  Culture, blood (routine x 2)     Status: None   Collection Time: 01/31/21  3:06 PM   Specimen: BLOOD  Result Value Ref Range Status   Specimen Description BLOOD SITE NOT SPECIFIED  Final   Special Requests   Final    BOTTLES DRAWN AEROBIC ONLY Blood Culture results may not be optimal due to an inadequate volume of blood received in culture bottles   Culture   Final    NO GROWTH 5 DAYS Performed at [facility], [place] [address] [place], [place] [address]    Report Status 02/05/2021 FINAL  Final  Culture, blood (routine x 2)     Status: None (Preliminary result)   Collection Time: 02/04/21 12:56 PM   Specimen: BLOOD  Result Value Ref Range Status   Specimen Description BLOOD LEFT ANTECUBITAL  Final   Special Requests   Final     BOTTLES DRAWN AEROBIC AND ANAEROBIC Blood  Culture adequate volume   Culture   Final    NO GROWTH 4 DAYS Performed at [facility], [place] [address], [place], [place] [address]    Report Status PENDING  Incomplete  Culture, blood (routine x 2)     Status: None (Preliminary result)   Collection Time: 02/04/21  1:03 PM   Specimen: BLOOD  Result Value Ref Range Status   Specimen Description BLOOD LEFT ANTECUBITAL  Final   Special Requests   Final    BOTTLES DRAWN AEROBIC AND ANAEROBIC Blood Culture adequate volume   Culture   Final    NO GROWTH 4 DAYS Performed at [facility], [place] [address], [place], [place] [address]    Report Status PENDING  Incomplete       Radiology Studies: No results found.  Scheduled Meds:  amiodarone  200 mg Oral BID   Chlorhexidine Gluconate Cloth  6 each Topical Daily   heparin  5,000 Units Subcutaneous Q8H   insulin aspart  0-15 Units Subcutaneous TID WC   levothyroxine  50 mcg Oral Q0600   mexiletine  150 mg Oral Q12H   midodrine  10 mg Oral TID WC   polyethylene glycol  17 g Oral BID   pravastatin  10 mg Oral q1800   senna-docusate  1 tablet Oral BID   torsemide  40 mg Oral Daily   Continuous Infusions:   ceFAZolin (ANCEF) IV 2 g (02/08/21 0529)     LOS: 10 days   Time spent: 27 minutes   Darliss Cheney, MD [facility]  02/08/2021, 12:35 PM  Please page via [place] and do not message via secure chat for anything urgent. Secure chat can be used for anything non urgent.  How to contact the [facility] Attending or Consulting provider [place] or covering provider during after hours [place], for this patient?  Check the care team in [facility] and look for a) attending/consulting TR[facility] provider listed and b) the [facility] team listed. Page or secure chat [phone]. Log into [email or web address] and use [place]'s universal password to access. If you do not have the password, please contact the hospital operator. Locate the [facility] provider you are looking for  under [facility] and page to a number that you can be directly reached. If you still have difficulty reaching the provider, please page the [facility] (Director on Call) for the Hospitalists listed on amion for assistance.

## 2021-02-08 NOTE — Progress Notes (Addendum)
Patient tolerated CPAP 3.5 hours from 2300-0230.    Update:  0606:  Patient agreeable to wear CPAP mask for an additional 1.5 hours from 0400-0530.  Patient tolerated CPAP mask for a TOTAL of 5 hours during HS.

## 2021-02-09 ENCOUNTER — Inpatient Hospital Stay: Payer: Self-pay

## 2021-02-09 ENCOUNTER — Inpatient Hospital Stay (HOSPITAL_COMMUNITY): Payer: Medicare HMO

## 2021-02-09 ENCOUNTER — Other Ambulatory Visit (HOSPITAL_COMMUNITY): Payer: Self-pay | Admitting: Respiratory Therapy

## 2021-02-09 ENCOUNTER — Encounter (HOSPITAL_COMMUNITY): Payer: Self-pay | Admitting: Internal Medicine

## 2021-02-09 LAB — CULTURE, BLOOD (ROUTINE X 2)
Culture: NO GROWTH
Culture: NO GROWTH
Special Requests: ADEQUATE
Special Requests: ADEQUATE

## 2021-02-09 LAB — GLUCOSE, CAPILLARY
Glucose-Capillary: 108 mg/dL — ABNORMAL HIGH (ref 70–99)
Glucose-Capillary: 136 mg/dL — ABNORMAL HIGH (ref 70–99)
Glucose-Capillary: 138 mg/dL — ABNORMAL HIGH (ref 70–99)
Glucose-Capillary: 129 mg/dL — ABNORMAL HIGH (ref 70–99)
Glucose-Capillary: 103 mg/dL — ABNORMAL HIGH (ref 70–99)

## 2021-02-09 LAB — RESP PANEL BY RT-PCR (FLU A&B, COVID) ARPGX2
Influenza A by PCR: NEGATIVE
Influenza B by PCR: NEGATIVE
SARS Coronavirus 2 by RT PCR: NEGATIVE

## 2021-02-09 NOTE — Progress Notes (Signed)
Physical Therapy Treatment Patient Details Name: Anthony Weber MRN: [id number] DOB: [birth year] Today's Date: 02/09/2021   History of Present Illness Pt is a 75 y.o. male admitted to [facility] on 01/18/21 with acute heart failure exacerbation; pt developed cardiogenic shock, acute respiratory failure, PNA with transfer to [facility] on 10/20. Course complicated by MSSA bacteremia, encephalopathy. PMH includes CHF, HTN, morbid obesity.    PT Comments    Pt making good progress with mobility. Eager to continue to rehab and eventually return home and to work.    Recommendations for follow up therapy are one component of a multi-disciplinary discharge planning process, led by the attending physician.  Recommendations may be updated based on patient status, additional functional criteria and insurance authorization.  Follow Up Recommendations  Skilled nursing-short term rehab (<3 hours/day)     Assistance Recommended at Discharge Frequent or constant Supervision/Assistance  Equipment Recommendations  None recommended by PT    Recommendations for Other Services       Precautions / Restrictions Precautions Precautions: Fall     Mobility  Bed Mobility Overal bed mobility: Needs Assistance Bed Mobility: Sit to Sidelying         Sit to sidelying: Mod assist;+2 for safety/equipment General bed mobility comments: Assist to bring legs up into the bed    Transfers Overall transfer level: Needs assistance Equipment used: Rolling walker (2 wheels) Transfers: Sit to/from Stand Sit to Stand: Mod assist;+2 safety/equipment           General transfer comment: Assist to bring hips up. Slow to rise. Verbal cues for hand placement    Ambulation/Gait Ambulation/Gait assistance: Min assist;+2 safety/equipment Gait Distance (Feet): 40 Feet (40' x 1 15' x1) Assistive device: Rolling walker (2 wheels) Gait Pattern/deviations: Step-through pattern;Decreased stride length;Trunk flexed Gait  velocity: Decreased Gait velocity interpretation: <1.31 ft/sec, indicative of household ambulator General Gait Details: Assist for balance and support.   Stairs             Wheelchair Mobility    Modified Rankin (Stroke Patients Only)       Balance Overall balance assessment: Needs assistance Sitting-balance support: No upper extremity supported;Feet supported Sitting balance-Leahy Scale: Fair     Standing balance support: Bilateral upper extremity supported;During functional activity Standing balance-Leahy Scale: Poor Standing balance comment: walker and min guard for static standing                            Cognition Arousal/Alertness: Awake/alert Behavior During Therapy: WFL for tasks assessed/performed Overall Cognitive Status: Within Functional Limits for tasks assessed                                          Exercises      General Comments General comments (skin integrity, edema, etc.): SpO2 94% on RA after amb. Left O2 off.      Pertinent Vitals/Pain Pain Assessment: No/denies pain    Home Living                          Prior Function            PT Goals (current goals can now be found in the care plan section) Acute Rehab PT Goals Patient Stated Goal: to return home Progress towards PT goals: Progressing toward goals    Frequency  Min 2X/week      PT Plan Current plan remains appropriate    Co-evaluation              AM-PAC PT "6 Clicks" Mobility   Outcome Measure  Help needed turning from your back to your side while in a flat bed without using bedrails?: A Lot Help needed moving from lying on your back to sitting on the side of a flat bed without using bedrails?: A Lot Help needed moving to and from a bed to a chair (including a wheelchair)?: A Lot Help needed standing up from a chair using your arms (e.g., wheelchair or bedside chair)?: A Lot Help needed to walk in hospital  room?: A Little Help needed climbing 3-5 steps with a railing? : Total 6 Click Score: 12    End of Session Equipment Utilized During Treatment: Gait belt Activity Tolerance: Patient tolerated treatment well Patient left: in bed;with bed alarm set;with call bell/phone within reach;with family/visitor present Nurse Communication: Mobility status PT Visit Diagnosis: Other abnormalities of gait and mobility (R26.89);Muscle weakness (generalized) (M62.81)     Time: 7253-6644 PT Time Calculation (min) (ACUTE ONLY): 26 min  Charges:  $Gait Training: 23-37 mins                     [place] Pager [phone] Office [place] 02/09/2021, 2:33 PM

## 2021-02-09 NOTE — TOC Progression Note (Signed)
Transition of Care [facility]) - Progression Note    Patient Details  Name: Anthony Weber MRN: [id number] Date of Birth: 1945/12/04  Transition of Care [facility]) CM/SW [place], [place] Phone Number: 02/09/2021, 11:34 AM  Clinical Narrative:    HF CSW spoke with the patient's daughter Army Melia at [employer] and she reported the insurance authorization is still pending. Otila Kluver reported that [facility] doesn't have CPAP or BIPAP's available at the facility and they use a company called [employer] and they are on back order because of COVID. HF RNCM, Edwin Cap reported that [employer] has a used CPAP and can get a new mask for him but he would need to be on trilogy and [facility] doesn't do Triliogy. The patient's wife and family don't want to send him to just any facility for rehab.   TOC will continue to follow through DC.   Expected Discharge Plan: [place] Barriers to Discharge: Continued Medical Work up  Expected Discharge Plan and Services Expected Discharge Plan: [place] In-house Referral: Clinical Social Work Discharge Planning Services: CM Consult   Living arrangements for the past 2 months: Single Family Home                                       Social Determinants of Health (SDOH) Interventions    Readmission Risk Interventions No flowsheet data found.

## 2021-02-09 NOTE — TOC CM/SW Note (Addendum)
HF TOC CM contacted [place] to expedite insurance auth for SNF. Insurance Josem Kaufmann is pending. Auth was approved auth # [id number] ref # [id number]. [employer] Case manager is Darnelle Bos. TOC CM spoke to [employer], Otila Kluver and provided update. Explained [facility] will service with a CPAP and his order for Trilogy is pending insurance auth. He will have Trilogy available once dc from SNF. They will have bed available in am, Will need dc summary and updated COVID (within 48 hours).    [place], Heart Failure TOC CM [phone]

## 2021-02-09 NOTE — TOC CM/SW Note (Signed)
HF TOC CM spoke to attending and orders received for CPAP. [facility] will work setting up CPAP at [facility] facility and orders received for Trilogy/NIV post SNF stay. Faxed Trilogy orders to [employer], [place]. Will need insurance approval for Trilogy. [place], Heart Failure TOC CM [phone]

## 2021-02-09 NOTE — Progress Notes (Signed)
PROGRESS NOTE    Anthony Weber  MLY:[id number] DOB: 01/30/1946 DOA: 01/29/2021 PCP: Albina Billet, MD   Brief Narrative:  75 yo M PMH morbid obesity, HTN, HLD, hypothyroidism, HFrEF who presented to [facility] ED 10/9 with CC BLE swelling. He reportedly gained 15lb over 1wk prior to presentation, had incr DOE. He was admitted to hospitalist service for acute heart failure exacerbation. Cardiology was consulted 10/10 and recommended diuresis, lisonopril and repeat ECHO which revealed LVEF 25-30% with indeterminate LV diastolic parameters and poorly visualized RV systolic function.  He received ongoing medication optimization and went for [facility] 10/18 which showed no significant CAD, moderately elevated L heart and PA pressure, severely elevated R Heart pressures. He was transferred to the ICU 10/18 and started on a milrinone infusion for cardiogenic shock.   On 10/20 the pt was started on a lasix gtt for hypoxia in setting of cardiogenic shock and a plan was made to transfer the patient to [place] for further management and care.  He was admitted to ICU and eventually was transitioned to [facility] care on 02/05/2021.  Significant Hospital Events: Including procedures, antibiotic start and stop dates in addition to other pertinent events   10/9 admitted to [facility] with likely HF exacerbation. Diuresed  10/10 cards consulted for HF recs  10/18 R/LHC without significant CAD, but had incr L heart, PA, and R heart pressures. Transferred to ICU, started on milronone> developed hypotension and NSVT> milrinone reduced and started norepi and amiodarone.  10/20 Transferred to [facility] 10/21 MSSA in blood cultures, A-line placed and removed overnight 10/24 pressors weaned off.  Transferred under [place] again on 02/05/2021    Assessment & Plan:   Principal Problem:   MSSA bacteremia Active Problems:   Cardiogenic shock ([place])   Central line infection   Pneumonia of both lower lobes due to methicillin  susceptible Staphylococcus aureus (MSSA) (HCC)   Hyponatremia   Acute on chronic combined systolic and diastolic congestive heart failure (HCC)   Acute on chronic respiratory failure with hypoxia and hypercapnia (HCC)  Shock - Cardiogenic/Septic/MSSA bacteremia - Initial presentation consistent with cardiogenic shock but now with septic overlay with PNA and MSSA bacteremia - Procalcitonin 0.8  Lactic Acid 1.2 . RHC cardiac index 1.5  - BCx on 01/30/2021, 2/2 MSSA. Urine Cx negative, A-line placed and removed overnight.  Blood cultures since then have been negative.  Once again central line removed on 02/04/2021 and blood cultures drawn.  ID managing antibiotics.  Currently on Ancef.   Fortunately, TEE was negative for any endocarditis, end date for cefazolin 03/09/2021.   Acute Systolic Heart Failure  - Echo EF now down from 55-->20-25%. Cath with no CAD, elevated filling pressures and low output heart failure.  - Off milrinone. Volume status ok.  -- No room for GDMT with hypotension.  Heart failure team signed off with recommendations to continue following medications. Torsemide 40 mg daily  Midodrine 10 mg tid  Amiodarone 200 mg daily Mexilitene 150 mg twice a day  Pravastatin 10 mg daily    Acute Hypoxic/Hypercarbia Respiratory Failure  - Improved with volume removal. Continue to wean oxygen as needed.  - Sats stable around 96% on 2 liters [place]. Continue wean as able.  Encouraged incentive spirometry.   NSVT, PVC  -High PVC burden On amio drip. PVCs burden reduced. Continue amio. Mexilitene added 10/26   Keep K> 4.0 Mg>2.0 - outpatient sleep study   AKI  - Creatinine peaked at 2.1 , now resolved.  Hyponatremia - Sodium improved, 130 today. - Restrict free water   Morbid Obesity   Body mass index is 42.48 kg/m.   AMS - ABC with CO2 retention, resolved, currently fully alert and oriented.   Suspected Sleep Apnea -will need outpatient sleep study.  Patient tolerated CPAP  well.  Plan to discharge on CPAP tomorrow to SNF.   Hypothyroidism  Continue Synthroid.   Hypokalemia Resolved  DVT prophylaxis: Place TED hose Start: 02/03/21 0905 heparin injection 5,000 Units Start: 01/29/21 2200 SCDs Start: 01/29/21 2001   Code Status: Full Code  Family Communication: None at bedside, plan of care discussed with patient.  Status is: Inpatient  Remains inpatient appropriate because: Needs further work-up.  Estimated body mass index is 41.66 kg/m as calculated from the following:   Height as of this encounter: 5\' 10"  (1.778 m).   Weight as of this encounter: 131.7 kg.     Nutritional Assessment: Body mass index is 41.66 kg/m.Marland Kitchen Seen by dietician.  I agree with the assessment and plan as outlined below: Nutrition Status:  Skin Assessment: I have examined the patient's skin and I agree with the wound assessment as performed by the wound care RN as outlined below:    Consultants:  Cardiology and ID  Procedures:  As above  Antimicrobials:  Anti-infectives (From admission, onward)    Start     Dose/Rate Route Frequency Ordered Stop   02/02/21 1615  ceFAZolin (ANCEF) IVPB 2g/100 mL premix        2 g 200 mL/hr over 30 Minutes Intravenous Every 8 hours 02/02/21 1515 03/04/21 2359   02/01/21 0845  ceFEPIme (MAXIPIME) 2 g in sodium chloride 0.9 % 100 mL IVPB  Status:  Discontinued        2 g 200 mL/hr over 30 Minutes Intravenous Every 8 hours 02/01/21 0756 02/02/21 1515   01/30/21 1015  ceFAZolin (ANCEF) IVPB 2g/100 mL premix  Status:  Discontinued        2 g 200 mL/hr over 30 Minutes Intravenous Every 8 hours 01/30/21 0927 01/30/21 0930   01/29/21 2115  vancomycin (VANCOREADY) IVPB 1250 mg/250 mL  Status:  Discontinued        1,250 mg 166.7 mL/hr over 90 Minutes Intravenous Every 24 hours 01/29/21 2029 01/31/21 1048   01/29/21 2100  ceFEPIme (MAXIPIME) 2 g in sodium chloride 0.9 % 100 mL IVPB  Status:  Discontinued        2 g 200 mL/hr over 30  Minutes Intravenous Every 12 hours 01/29/21 2004 02/01/21 0756          Subjective:  Patient seen and examined.  He has no complaints.  Objective: Vitals:   02/08/21 2333 02/09/21 0331 02/09/21 0800 02/09/21 1115  BP: 123/64 (!) 103/91 117/79 129/84  Pulse: 73 68 86 87  Resp: 17 16 18 18   Temp: 98 F (36.7 C) 98.1 F (36.7 C) 97.6 F (36.4 C) 97.8 F (36.6 C)  TempSrc: Oral Oral Oral Oral  SpO2: 96% 95% 96% 96%  Weight:  131.7 kg    Height:        Intake/Output Summary (Last 24 hours) at 02/09/2021 1504 Last data filed at 02/09/2021 0603 Gross per 24 hour  Intake 360 ml  Output 1300 ml  Net -940 ml    Filed Weights   02/07/21 0612 02/08/21 0355 02/09/21 0331  Weight: 134.9 kg 134.1 kg 131.7 kg    Examination:  General exam: Appears calm and comfortable, morbidly obese Respiratory system: Clear  to auscultation. Respiratory effort normal. Cardiovascular system: S1 & S2 heard, RRR. No JVD, murmurs, rubs, gallops or clicks. No pedal edema. Gastrointestinal system: Abdomen is nondistended, soft and nontender. No organomegaly or masses felt. Normal bowel sounds heard. Central nervous system: Alert and oriented. No focal neurological deficits. Extremities: Symmetric 5 x 5 power. Skin: No rashes, lesions or ulcers.  Psychiatry: Judgement and insight appear normal. Mood & affect appropriate.    Data Reviewed: I have personally reviewed following labs and imaging studies  CBC: Recent Labs  Lab 02/03/21 0517 02/05/21 0418 02/06/21 0219 02/07/21 0052  WBC 13.5* 13.3* 11.0* 12.5*  NEUTROABS  --   --  8.0* 9.5*  HGB 13.0 13.5 13.3 13.0  HCT 38.5* 41.1 39.8 39.9  MCV 85.0 85.4 85.6 86.2  PLT 209 277 278 371    Basic Metabolic Panel: Recent Labs  Lab 02/03/21 0517 02/04/21 0435 02/05/21 0418 02/06/21 0219 02/07/21 0052 02/08/21 0452  NA 127* 127* 127* 128* 130* 130*  K 3.7 4.4 4.6 4.3 4.5 4.6  CL 82* 85* 90* 94* 94* 96*  CO2 37* 33* 29 29 28 25    GLUCOSE 127* 100* 92 101* 97 90  BUN 54* 47* 40* 38* 36* 29*  CREATININE 1.06 1.01 1.05 0.89 1.07 0.97  CALCIUM 8.3* 8.2* 8.7* 8.5* 8.6* 8.7*  MG 2.0  --  2.2 2.3  --   --   PHOS 3.2  --  3.6  --   --   --     GFR: Estimated Creatinine Clearance: 88.4 mL/min (by C-G formula based on SCr of 0.97 mg/dL). Liver Function Tests: Recent Labs  Lab 02/03/21 0517 02/04/21 0435 02/05/21 0418  AST 28 29 33  ALT 25 20 17   ALKPHOS 83 84 93  BILITOT 1.0 0.9 0.9  PROT 5.7* 5.7* 6.0*  ALBUMIN 2.1* 2.1* 2.4*    No results for input(s): LIPASE, AMYLASE in the last 168 hours. No results for input(s): AMMONIA in the last 168 hours.  Coagulation Profile: No results for input(s): INR, PROTIME in the last 168 hours. Cardiac Enzymes: No results for input(s): CKTOTAL, CKMB, CKMBINDEX, TROPONINI in the last 168 hours. BNP (last 3 results) No results for input(s): PROBNP in the last 8760 hours. HbA1C: No results for input(s): HGBA1C in the last 72 hours. CBG: Recent Labs  Lab 02/08/21 2130 02/08/21 2132 02/09/21 0606 02/09/21 1136 02/09/21 1423  GLUCAP 124* 127* 108* 136* 138*    Lipid Profile: No results for input(s): CHOL, HDL, LDLCALC, TRIG, CHOLHDL, LDLDIRECT in the last 72 hours. Thyroid Function Tests: No results for input(s): TSH, T4TOTAL, FREET4, T3FREE, THYROIDAB in the last 72 hours. Anemia Panel: No results for input(s): VITAMINB12, FOLATE, FERRITIN, TIBC, IRON, RETICCTPCT in the last 72 hours. Sepsis Labs: No results for input(s): PROCALCITON, LATICACIDVEN in the last 168 hours.   Recent Results (from the past 240 hour(s))  Culture, blood (routine x 2)     Status: None   Collection Time: 01/31/21  6:08 AM   Specimen: BLOOD RIGHT HAND  Result Value Ref Range Status   Specimen Description BLOOD RIGHT HAND  Final   Special Requests   Final    AEROBIC BOTTLE ONLY Blood Culture results may not be optimal due to an inadequate volume of blood received in culture bottles    Culture   Final    NO GROWTH 5 DAYS Performed at [facility], [place] [address], [place], [place] [address]    Report Status 02/05/2021 FINAL  Final  Culture, blood (routine x 2)     Status: None   Collection Time: 01/31/21  3:06 PM   Specimen: BLOOD  Result Value Ref Range Status   Specimen Description BLOOD SITE NOT SPECIFIED  Final   Special Requests   Final    BOTTLES DRAWN AEROBIC ONLY Blood Culture results may not be optimal due to an inadequate volume of blood received in culture bottles   Culture   Final    NO GROWTH 5 DAYS Performed at [facility], [address] [place], [place] [address]    Report Status 02/05/2021 FINAL  Final  Culture, blood (routine x 2)     Status: None   Collection Time: 02/04/21 12:56 PM   Specimen: BLOOD  Result Value Ref Range Status   Specimen Description BLOOD LEFT ANTECUBITAL  Final   Special Requests   Final    BOTTLES DRAWN AEROBIC AND ANAEROBIC Blood Culture adequate volume   Culture   Final    NO GROWTH 5 DAYS Performed at [facility], [place] [address], [place], [place] [address]    Report Status 02/09/2021 FINAL  Final  Culture, blood (routine x 2)     Status: None   Collection Time: 02/04/21  1:03 PM   Specimen: BLOOD  Result Value Ref Range Status   Specimen Description BLOOD LEFT ANTECUBITAL  Final   Special Requests   Final    BOTTLES DRAWN AEROBIC AND ANAEROBIC Blood Culture adequate volume   Culture   Final    NO GROWTH 5 DAYS Performed at [facility], [place] [address] [place],  [address]    Report Status 02/09/2021 FINAL  Final       Radiology Studies: No results found.  Scheduled Meds:  amiodarone  200 mg Oral BID   Chlorhexidine Gluconate Cloth  6 each Topical Daily   heparin  5,000 Units Subcutaneous Q8H   insulin aspart  0-15 Units Subcutaneous TID WC   levothyroxine  50 mcg Oral Q0600   mexiletine  150 mg Oral Q12H   midodrine  10 mg Oral TID WC   polyethylene glycol  17 g  Oral BID   pravastatin  10 mg Oral q1800   senna-docusate  1 tablet Oral BID   torsemide  40 mg Oral Daily   Continuous Infusions:   ceFAZolin (ANCEF) IV 2 g (02/09/21 1430)     LOS: 11 days   Time spent: 25 minutes   Darliss Cheney, MD [facility]  02/09/2021, 3:04 PM  Please page via [place] and do not message via secure chat for anything urgent. Secure chat can be used for anything non urgent.  How to contact the [facility] Attending or Consulting provider [place] or covering provider during after hours [place], for this patient?  Check the care team in [facility] and look for a) attending/consulting TRH provider listed and b) the [facility] team listed. Page or secure chat [phone]. Log into [email or web address] and use [place]'s universal password to access. If you do not have the password, please contact the hospital operator. Locate the [facility] provider you are looking for under [facility] and page to a number that you can be directly reached. If you still have difficulty reaching the provider, please page the [facility] (Director on Call) for the Hospitalists listed on amion for assistance.

## 2021-02-09 NOTE — Progress Notes (Signed)
Mobility Specialist: Progress Note   02/09/21 1637  Mobility  Activity Ambulated in hall  Level of Assistance Minimal assist, patient does 75% or more  Assistive Device Front wheel walker  Distance Ambulated (ft) 60 ft  Mobility Ambulated with assistance in hallway  Mobility Response Tolerated well  Mobility performed by Mobility specialist  $Mobility charge 1 Mobility   Pre-Mobility: 71 HR Post-Mobility: 80 HR, 94% SpO2  Pt required minA to sit EOB from supine as well as to stand from EOB. Pt had no c/o during ambulation. Pt back to bed with call bell and phone at his side.   [facility] Anthony Weber Mobility Specialist Mobility Specialist Phone: [phone]

## 2021-02-10 LAB — GLUCOSE, CAPILLARY
Glucose-Capillary: 121 mg/dL — ABNORMAL HIGH (ref 70–99)
Glucose-Capillary: 117 mg/dL — ABNORMAL HIGH (ref 70–99)
Glucose-Capillary: 107 mg/dL — ABNORMAL HIGH (ref 70–99)
Glucose-Capillary: 126 mg/dL — ABNORMAL HIGH (ref 70–99)

## 2021-02-10 MED ORDER — SODIUM CHLORIDE 0.9% FLUSH: 10.0000 mL | INTRAVENOUS | Status: DC | PRN

## 2021-02-10 MED ORDER — AMIODARONE HCL 200 MG PO TABS: 200.0000 mg | ORAL_TABLET | Freq: Every day | ORAL | 0 refills | Status: DC

## 2021-02-10 MED ORDER — MEXILETINE HCL 150 MG PO CAPS: 150.0000 mg | ORAL_CAPSULE | Freq: Two times a day (BID) | ORAL | 0 refills | Status: DC

## 2021-02-10 MED ORDER — SODIUM CHLORIDE 0.9% FLUSH
10.0000 mL | Freq: Two times a day (BID) | INTRAVENOUS | Status: DC
  Administered 2021-02-11: 20 mL

## 2021-02-10 MED ORDER — HEPARIN SOD (PORK) LOCK FLUSH 100 UNIT/ML IV SOLN
250.0000 [IU] | INTRAVENOUS | Status: AC | PRN
  Administered 2021-02-10: 250 [IU]
  Filled 2021-02-10: qty 3

## 2021-02-10 MED ORDER — MIDODRINE HCL 10 MG PO TABS: 10.0000 mg | ORAL_TABLET | Freq: Three times a day (TID) | ORAL | 0 refills | Status: AC

## 2021-02-10 MED ORDER — TORSEMIDE 40 MG PO TABS: 40.0000 mg | ORAL_TABLET | Freq: Every day | ORAL | 0 refills | Status: DC

## 2021-02-10 MED ORDER — CEFAZOLIN IV (FOR PTA / DISCHARGE USE ONLY): 2.0000 g | Freq: Three times a day (TID) | INTRAVENOUS | 0 refills | Status: AC

## 2021-02-10 NOTE — Progress Notes (Signed)
Occupational Therapy Treatment Patient Details Name: Anthony Weber MRN: [id number] DOB: Jun 01, 1945 Today's Date: 02/10/2021   History of present illness Pt is a 75 y.o. male admitted to [facility] on 01/18/21 with acute heart failure exacerbation; pt developed cardiogenic shock, acute respiratory failure, PNA with transfer to [facility] on 10/20. Course complicated by MSSA bacteremia, encephalopathy. PMH includes CHF, HTN, morbid obesity.   OT comments  Pt progressing gradually towards OT goals. Planned to further address mobility and standing tolerance with ADLs this AM. However, pt with bowel urgency so assisted to [facility] at [place] with RW. Pt benefits from encouragement to attempt tasks and cues for safe sequencing. Pt continues to require extensive assist for LB ADLs (including toileting hygiene this AM) due to decreased endurance, balance and strength. Continue to rec SNF rehab at DC.    Recommendations for follow up therapy are one component of a multi-disciplinary discharge planning process, led by the attending physician.  Recommendations may be updated based on patient status, additional functional criteria and insurance authorization.    Follow Up Recommendations  Skilled nursing-short term rehab (<3 hours/day)    Assistance Recommended at Discharge Frequent or constant Supervision/Assistance  Equipment Recommendations  Other (comment) (defer to next venue)    Recommendations for Other Services      Precautions / Restrictions Precautions Precautions: Fall Restrictions Weight Bearing Restrictions: No       Mobility Bed Mobility Overal bed mobility: Needs Assistance Bed Mobility: Supine to Sit         Sit to sidelying: Mod assist General bed mobility comments: Mod A to advance trunk to EOB, light assist for L LE to EOB    Transfers Overall transfer level: Needs assistance Equipment used: Rolling walker (2 wheels) Transfers: Sit to/from [employer] Transfers Sit to  Stand: Min guard;From elevated surface Stand pivot transfers: Min assist;From elevated surface         General transfer comment: min guard for sit to stands from elevated bed and BSC. Cues needed for problem solving hand placement for BSC transfer (to push up from armrests as pt attempting to push on RW)     Balance Overall balance assessment: Needs assistance Sitting-balance support: No upper extremity supported;Feet supported Sitting balance-Leahy Scale: Fair     Standing balance support: Bilateral upper extremity supported;During functional activity Standing balance-Leahy Scale: Poor Standing balance comment: reliant on UE support in standing                           ADL either performed or assessed with clinical judgement   ADL Overall ADL's : Needs assistance/impaired Eating/Feeding: Independent Eating/Feeding Details (indicate cue type and reason): sitting EOB, increased time to open containers Grooming: Set up;Sitting;Wash/dry hands                   Toilet Transfer: Minimal assistance;Stand-pivot;BSC;Rolling walker (2 wheels) Toilet Transfer Details (indicate cue type and reason): Min A for navigating RW and stabilizing DME Toileting- Clothing Manipulation and Hygiene: Sit to/from stand;Total assistance Toileting - Clothing Manipulation Details (indicate cue type and reason): cued to assist with clothing mgmt, attempt hygiene though pt not following through. pt reports typically leaning side to side for hygiene though unable to on smaller BSC. Encouraged to attempt while standing which also addresses balance       General ADL Comments: Limited by decreased activity tolerance, benefits from encouragement to attempt tasks     Vision   Vision Assessment?:  No apparent visual deficits   Perception     Praxis      Cognition Arousal/Alertness: Awake/alert Behavior During Therapy: WFL for tasks assessed/performed Overall Cognitive Status: Within  Functional Limits for tasks assessed                                 General Comments: benefits from encouragement to attempt tasks, able to follow directions          Exercises     Shoulder Instructions       General Comments HR WFL    Pertinent Vitals/ Pain       Pain Assessment: No/denies pain  Home Living                                          Prior Functioning/Environment              Frequency  Min 2X/week        Progress Toward Goals  OT Goals(current goals can now be found in the care plan section)  Progress towards OT goals: Progressing toward goals  Acute Rehab OT Goals Patient Stated Goal: go to rehab and go home OT Goal Formulation: With patient Time For Goal Achievement: 02/16/21 Potential to Achieve Goals: Good ADL Goals Pt Will Perform Grooming: with set-up;standing Pt Will Perform Lower Body Bathing: with supervision;sit to/from stand Pt Will Perform Lower Body Dressing: with supervision;sit to/from stand Pt Will Transfer to Toilet: ambulating;with supervision Pt Will Perform Toileting - Clothing Manipulation and hygiene: with supervision;sit to/from stand Pt/caregiver will Perform Home Exercise Program: Increased strength;Both right and left upper extremity;With theraband;Independently;With written HEP provided  Plan Discharge plan remains appropriate    Co-evaluation                 AM-PAC OT "6 Clicks" Daily Activity     Outcome Measure   Help from another person eating meals?: None Help from another person taking care of personal grooming?: A Little Help from another person toileting, which includes using toliet, bedpan, or urinal?: Total Help from another person bathing (including washing, rinsing, drying)?: A Lot Help from another person to put on and taking off regular upper body clothing?: A Lot Help from another person to put on and taking off regular lower body clothing?: Total 6 Click  Score: 13    End of Session Equipment Utilized During Treatment: Rolling walker (2 wheels)  OT Visit Diagnosis: Unsteadiness on feet (R26.81);Muscle weakness (generalized) (M62.81);Other abnormalities of gait and mobility (R26.89)   Activity Tolerance Patient tolerated treatment well   Patient Left in bed;with call bell/phone within reach;Other (comment) (sitting EOB with breakfast and awaiting PICC line placement)   Nurse Communication Mobility status        Time: 4827-0786 OT Time Calculation (min): 29 min  Charges: OT General Charges $OT Visit: 1 Visit OT Treatments $Self Care/Home Management : 23-37 mins  Malachy Chamber, OTR/L Acute Rehab Services Office: [phone]   Layla Maw 02/10/2021, 8:14 AM

## 2021-02-10 NOTE — Care Management Important Message (Signed)
Important Message  Patient Details  Name: Anthony Weber MRN: [id number] Date of Birth: 02-Oct-1945   Medicare Important Message Given:  Yes     Shelda Altes 02/10/2021, 11:23 AM

## 2021-02-10 NOTE — TOC Progression Note (Addendum)
Transition of Care [facility]) - Progression Note    Patient Details  Name: Anthony Weber MRN: [id number] Date of Birth: 1945-11-25  Transition of Care [facility]) CM/SW [place], [place] Phone Number: 02/10/2021, 9:33 AM  Clinical Narrative:    HF CSW spoke to Army Melia at [facility] and she is ready for [name] to DC today. Informed Otila Kluver that a COVID test was done yesterday and it is negative. [facility] is okay with IV abx. Army Melia at [facility] reported that [name] cannot DC today due to a COVID outbreak and that Mr. Bones will need to DC tomorrow. CSW informed the attending MD. CSW called the patient's spouse [name] to let her know that [facility] has a COVID outbreak and that [name] will need to DC tomorrow.  CSW will continue to follow throughout discharge.   Expected Discharge Plan: [place] Barriers to Discharge: Continued Medical Work up  Expected Discharge Plan and Services Expected Discharge Plan: [place] In-house Referral: Clinical Social Work Discharge Planning Services: CM Consult   Living arrangements for the past 2 months: Single Family Home Expected Discharge Date: 02/10/21                                     Social Determinants of Health (SDOH) Interventions    Readmission Risk Interventions No flowsheet data found.

## 2021-02-10 NOTE — Progress Notes (Signed)
Mobility Specialist: Progress Note   02/10/21 1138  Mobility  Activity Ambulated in hall  Level of Assistance Moderate assist, patient does 50-74%  Assistive Device Front wheel walker  Distance Ambulated (ft) 80 ft  Mobility Ambulated with assistance in hallway  Mobility Response Tolerated well  Mobility performed by Mobility specialist  Bed Position Chair  $Mobility charge 1 Mobility   Pre-Mobility: 71 HR Post-Mobility: 77 HR  Pt able to ambulate further in the hallway today with no c/o. Pt to [facility] and then to the recliner per request. Noticed sore on pt's backside when transferring from [facility], RN notified.   [facility] Kasandra Fehr Mobility Specialist Mobility Specialist Phone: [phone]

## 2021-02-10 NOTE — Care Plan (Signed)
I was informed this morning that everything is ready for the patient to go to SNF[facility], discharge paperwork was completed including summary.  I am now being informed by TOC that the facility has COVID outbreak and they are not excepting patients today but patient can discharge tomorrow.  TOC to discuss with family about alternative options if they wish to pursue.  If not, he will be discharged tomorrow.

## 2021-02-10 NOTE — Progress Notes (Signed)
Peripherally Inserted Central Catheter Placement  The IV Nurse has discussed with the patient and/or persons authorized to consent for the patient, the purpose of this procedure and the potential benefits and risks involved with this procedure.  The benefits include less needle sticks, lab draws from the catheter, and the patient may be discharged home with the catheter. Risks include, but not limited to, infection, bleeding, blood clot (thrombus formation), and puncture of an artery; nerve damage and irregular heartbeat and possibility to perform a PICC exchange if needed/ordered by physician.  Alternatives to this procedure were also discussed.  Bard Power PICC patient education guide, fact sheet on infection prevention and patient information card has been provided to patient /or left at bedside.    PICC Placement Documentation  PICC Single Lumen 82/50/03 Right Basilic 44 cm 0 cm (Active)  Indication for Insertion or Continuance of Line Home intravenous therapies (PICC only) 02/10/21 0844  Exposed Catheter (cm) 0 cm 02/10/21 0844  Site Assessment Clean;Dry;Intact 02/10/21 0844  Line Status Flushed;Saline locked;Blood return noted 02/10/21 0844  Dressing Type Securing device;Transparent 02/10/21 0844  Dressing Status Clean;Dry;Intact 02/10/21 0844  Antimicrobial disc in place? Yes 02/10/21 0844  Safety Lock Not Applicable 70/48/88 9169  Dressing Change Due 02/17/21 02/10/21 0844       Frances Maywood 02/10/2021, 8:46 AM

## 2021-02-10 NOTE — Discharge Summary (Addendum)
Physician Discharge Summary  Anthony Weber ZPH:[id number] DOB: 22-Nov-1945 DOA: 01/29/2021  PCP: Albina Billet, MD  Admit date: 01/29/2021 Discharge date: 02/10/2021 30 Day Unplanned Readmission Risk Score    Flowsheet Row Admission (Current) from 01/29/2021 in [facility] 4E CV SURGICAL PROGRESSIVE CARE  30 Day Unplanned Readmission Risk Score (%) 19.77 Filed at 02/10/2021 0400       This score is the patient's risk of an unplanned readmission within 30 days of being discharged (0 -100%). The score is based on dignosis, age, lab data, medications, orders, and past utilization.   Low:  0-14.9   Medium: 15-21.9   High: 22-29.9   Extreme: 30 and above          Admitted From: Home Disposition: SNF  Recommendations for Outpatient Follow-up:  Follow up with PCP in 1-2 weeks Please obtain BMP/CBC in one week Follow-up with cardiology in 1 to 2 weeks Follow-up with ID in 2 to 4 weeks We recommend outpatient sleep study. Please follow up with your PCP on the following pending results: Unresulted Labs (From admission, onward)    None         Home Health: None Equipment/Devices: None  Discharge Condition: Stable CODE STATUS: Full code Diet recommendation: Cardiac, low-sodium  Subjective: Seen and examined.  He has no complaints.  He is happy that he is being discharged from hospital today.  Brief/Interim Summary: 75 yo M PMH morbid obesity, HTN, HLD, hypothyroidism, HFrEF who presented to [facility] ED 10/9 with CC BLE swelling. He reportedly gained 15lb over 1wk prior to presentation, had incr DOE. He was admitted to hospitalist service for acute heart failure exacerbation. Cardiology was consulted 10/10 and recommended diuresis, lisonopril and repeat ECHO which revealed LVEF 25-30% with indeterminate LV diastolic parameters and poorly visualized RV systolic function.  He received ongoing medication optimization and went for [facility] 10/18 which showed no significant CAD, moderately  elevated L heart and PA pressure, severely elevated R Heart pressures. He was transferred to the ICU 10/18 and started on a milrinone infusion for cardiogenic shock.   On 10/20 the pt was started on a lasix gtt for hypoxia in setting of cardiogenic shock and a plan was made to transfer the patient to [place] for further management and care.  He was admitted to ICU and eventually was transitioned to [facility] care on 02/05/2021.   Significant Hospital Events: Including procedures, antibiotic start and stop dates in addition to other pertinent events   10/9 admitted to [facility] with likely HF exacerbation. Diuresed  10/10 cards consulted for HF recs  10/18 R/LHC without significant CAD, but had incr L heart, PA, and R heart pressures. Transferred to ICU, started on milronone> developed hypotension and NSVT> milrinone reduced and started norepi and amiodarone.  10/20 Transferred to [facility] 10/21 MSSA in blood cultures, A-line placed and removed overnight 10/24 pressors weaned off.  Transferred under [place] again on 02/05/2021   Shock - Cardiogenic/Septic/MSSA bacteremia - Initial presentation consistent with cardiogenic shock but now with septic overlay with PNA and MSSA bacteremia - BCx on 01/30/2021, 2/2 MSSA. Urine Cx negative, A-line placed and removed overnight.  Blood cultures since then have been negative.  Once again central line removed on 02/04/2021 and blood cultures drawn which have remained negative to date.  ID was managing antibiotics.  Currently on Ancef.   Fortunately, TEE was negative for any endocarditis, ID recommended continuing current antibiotics with end date for cefazolin 03/09/2021.  Patient will follow-up with ID  as outpatient.   Acute Systolic Heart Failure  - Echo EF now down from 55-->20-25%. Cath with no CAD, elevated filling pressures and low output heart failure.  - Off milrinone. Volume status ok.  -- No room for GDMT with hypotension.  Heart failure team signed off with  recommendations to continue following medications. Torsemide 40 mg daily  Midodrine 10 mg tid  Amiodarone 200 mg daily Mexilitene 150 mg twice a day  Pravastatin 10 mg daily    Acute Hypoxic/Hypercarbia Respiratory Failure  - Improved with volume removal. Continue to wean oxygen as needed.  Currently stable and saturating about 95% on room air.   NSVT, PVC  Continue amio. Mexilitene added 10/26   Keep K> 4.0 Mg>2.0 - outpatient sleep study.     AKI  - Creatinine peaked at 2.1 , now resolved.   Hyponatremia Stable.   Morbid Obesity   Body mass index is 42.48 kg/m.   AMS - ABC with CO2 retention, resolved, currently fully alert and oriented.   Suspected Sleep Apnea -will need outpatient sleep study.  Patient tolerated CPAP well.  Plan to discharge on CPAP.   Hypothyroidism  Continue Synthroid.   Discharge Diagnoses:  Principal Problem:   MSSA bacteremia Active Problems:   Cardiogenic shock ([place])   Central line infection   Pneumonia of both lower lobes due to methicillin susceptible Staphylococcus aureus (MSSA) (HCC)   Hyponatremia   Acute on chronic combined systolic and diastolic congestive heart failure (HCC)   Acute on chronic respiratory failure with hypoxia and hypercapnia [facility])    Discharge Instructions  Discharge Instructions     [facility] pharmacist to adjust dose for Vancomycin, Aminoglycosides and other anti-infective therapies as requested by physician.   Complete by: As directed    [facility] to provide Cath Flo 20m   Complete by: As directed    Administer for PICC line occlusion and as ordered by physician for other access device issues.   Anaphylaxis Kit: Provided to treat any anaphylactic reaction to the medication being provided to the patient if First Dose or when requested by physician   Complete by: As directed    Epinephrine 155mml vial / amp: Administer 0.23m67m0.23ml55mubcutaneously once for moderate to severe  anaphylaxis, nurse to call physician and pharmacy when reaction occurs and call 911 if needed for immediate care   Diphenhydramine 50mg59mIV vial: Administer 25-50mg 38mM PRN for first dose reaction, rash, itching, mild reaction, nurse to call physician and pharmacy when reaction occurs   Sodium Chloride 0.9% NS 500ml I67mdminister if needed for hypovolemic blood pressure drop or as ordered by physician after call to physician with anaphylactic reaction   Change dressing on IV access line weekly and PRN   Complete by: As directed    Flush IV access with Sodium Chloride 0.9% and Heparin 10 units/ml or 100 units/ml   Complete by: As directed    Home infusion instructions - [facility]   Complete by: As directed    Instructions: Flush IV access with Sodium Chloride 0.9% and Heparin 10units/ml or 100units/ml   Change dressing on IV access line: Weekly and PRN   Instructions Cath Flo 2mg: Ad11mister for PICC Line occlusion and as ordered by physician for other access device   [facility] pharmacist to adjust dose for: Vancomycin, Aminoglycosides and other anti-infective therapies as requested by physician   Method of administration may be changed at the discretion of home infusion pharmacist based  upon assessment of the patient and/or caregiver's ability to self-administer the medication ordered   Complete by: As directed       Allergies as of 02/10/2021   No Known Allergies      Medication List     STOP taking these medications    amiodarone 360-4.14 MG/200ML-% Soln Commonly known as: NEXTERONE PREMIX   lisinopril 40 MG tablet Commonly known as: ZESTRIL   milrinone 20 MG/100 ML Soln infusion Commonly known as: PRIMACOR   norepinephrine 4-5 MG/250ML-% Soln Commonly known as: LEVOPHED       TAKE these medications    amiodarone 200 MG tablet Commonly known as: PACERONE Take 1 tablet (200 mg total) by mouth daily.   aspirin 325 MG tablet Take 325 mg  by mouth daily.   ceFAZolin  IVPB Commonly known as: ANCEF Inject 2 g into the vein every 8 (eight) hours for 26 days. Indication:  MSSA Bacteremia First Dose: Yes Last Day of Therapy:  03/09/21 Labs - Once weekly:  CBC/D and BMP, Labs - Every other week:  ESR and CRP Method of administration: IV Push Method of administration may be changed at the discretion of home infusion pharmacist based upon assessment of the patient and/or caregiver's ability to self-administer the medication ordered.   folic acid 017 MCG tablet Commonly known as: FOLVITE Take 400 mcg by mouth daily.   levothyroxine 50 MCG tablet Commonly known as: SYNTHROID Take 50 mcg by mouth daily before breakfast.   mexiletine 150 MG capsule Commonly known as: MEXITIL Take 1 capsule (150 mg total) by mouth every 12 (twelve) hours.   midodrine 10 MG tablet Commonly known as: PROAMATINE Take 1 tablet (10 mg total) by mouth 3 (three) times daily with meals.   pravastatin 10 MG tablet Commonly known as: PRAVACHOL Take 10 mg by mouth daily.   Torsemide 40 MG Tabs Take 40 mg by mouth daily.   vitamin B-12 100 MCG tablet Commonly known as: CYANOCOBALAMIN Take 100 mcg by mouth daily.   vitamin C 1000 MG tablet Take 1,000 mg by mouth daily.   Vitamin D3 125 MCG (5000 UT) Caps Take 1 capsule by mouth daily.               Durable Medical Equipment  (From admission, onward)           Start     Ordered   02/09/21 1313  For home use only DME continuous positive airway pressure (CPAP)  Once       Comments: 4-20  Question Answer Comment  Length of Need Lifetime   Patient has OSA or probable OSA Yes   Is the patient currently using CPAP in the home No   Settings Other see comments   CPAP supplies needed Mask, headgear, cushions, filters, heated tubing and water chamber      02/09/21 1313   02/04/21 0909  For home use only DME Bipap  Once       Question:  Length of Need  Answer:  Lifetime   02/04/21  0908              Discharge Care Instructions  (From admission, onward)           Start     Ordered   02/10/21 0000  Change dressing on IV access line weekly and PRN  (Home infusion instructions - [facility] )        02/10/21 0733  Follow-up Information     [facility] Follow up.   Specialty: Cardiology Why: 02/17/21 :30 PM [facility] Luvenia Heller Contact information: [address] [id number] [place]        Albina Billet, MD Follow up in 1 week(s).   Specialty: Internal Medicine Contact information: [address]   [place] [address] [phone]         Minna Merritts, MD .   Specialty: Cardiology Contact information: [place] [address] [phone]                No Known Allergies  Consultations: ID, cardiology   Procedures/Studies: DG Chest 1 View  Result Date: 01/27/2021 CLINICAL DATA:  Central line placement EXAM: CHEST  1 VIEW COMPARISON:  01/18/2021 FINDINGS: Interval placement of right IJ central venous catheter with tip over the SVC. No visible right pneumothorax. Cardiomegaly with vascular congestion. Probable small left effusion with left basilar airspace disease. IMPRESSION: 1. Right IJ central venous catheter tip over the SVC. No pneumothorax 2. Cardiomegaly with mild central congestion 3. Possible small left effusion with mild airspace disease at left base. Electronically Signed   By: Donavan Foil M.D.   On: 01/27/2021 22:00   CARDIAC CATHETERIZATION  Result Date: 01/27/2021 Conclusions: No angiographically significant coronary artery disease. Moderately elevated left heart and pulmonary artery pressures. Severely elevated right heart filling pressure. Severely reduced Fick cardiac output/index. Recommendations: Transfer to  stepdown for initiation of milrinone infusion in the setting of low output heart failure. Decrease carvedilol and losartan with initiation of milrinone and borderline hypotension. Diurese as renal function tolerates following initiation of milrinone. Primary prevention of coronary artery disease. Nelva Bush, MD [facility]  DG CHEST PORT 1 VIEW  Result Date: 02/03/2021 CLINICAL DATA:  Central line placement EXAM: PORTABLE CHEST 1 VIEW COMPARISON:  01/29/2021 FINDINGS: Right central line in place with the tip in the SVC. No pneumothorax. Cardiomegaly with vascular congestion. Left lower lobe consolidation with probable left effusion. No confluent opacity on the right. IMPRESSION: Right central line tip in the SVC.  No pneumothorax. Cardiomegaly, vascular congestion. Left lower lobe airspace opacity with left effusion. Cannot exclude pneumonia. Electronically Signed   By: Rolm Baptise M.D.   On: 02/03/2021 19:06   DG Chest Port 1 View  Result Date: 01/29/2021 CLINICAL DATA:  Dyspnea EXAM: PORTABLE CHEST 1 VIEW COMPARISON:  01/27/2021 FINDINGS: Right jugular central venous catheter tip near the cavoatrial junction unchanged. No pneumothorax Bibasilar airspace disease left greater than right unchanged. No significant effusion. IMPRESSION: Central line in the cavoatrial junction Bibasilar airspace disease left greater than right unchanged. Electronically Signed   By: Franchot Gallo M.D.   On: 01/29/2021 14:54   DG Chest Port 1 View  Result Date: 01/18/2021 CLINICAL DATA:  Shortness of breath, leg swelling EXAM: PORTABLE CHEST 1 VIEW COMPARISON:  06/23/2011 FINDINGS: Mild cardiomegaly, vascular congestion. Left basilar atelectasis or infiltrate. Suspect small left effusion. No overt edema. No acute bony abnormality. IMPRESSION: Borderline cardiomegaly, vascular congestion. Left lower lobe atelectasis or infiltrate.  Small left effusion. Electronically Signed   By: Rolm Baptise M.D.   On: 01/18/2021  17:09   ECHOCARDIOGRAM COMPLETE  Result Date: 01/19/2021    ECHOCARDIOGRAM REPORT   Patient Name:   Anthony Weber Date of Exam: 01/19/2021 Medical Rec #:  [id number]  Height:       70.0 in Accession #:    [id number]           Weight:       346.1 lb Date of Birth:  [birth year]            BSA:          2.635 m Patient Age:    37 years             BP:           111/56 mmHg Patient Gender: M                    HR:           62 bpm. Exam Location:  [facility] Procedure: 2D Echo, Cardiac Doppler, Color Doppler and Intracardiac            Opacification Agent Indications:     CHF I50.9  History:         Patient has prior history of Echocardiogram examinations, most                  recent 04/18/2020. Risk Factors:Hypertension.  Sonographer:     Sherrie Sport Referring Phys:  [id number] Robin Searing VISSER Diagnosing Phys: Kathlyn Sacramento MD  Sonographer Comments: Technically difficult study due to poor echo windows. IMPRESSIONS  1. Left ventricular ejection fraction, by estimation, is 25 to 30%. The left ventricle has severely decreased function. Left ventricular endocardial border not optimally defined to evaluate regional wall motion. Left ventricular diastolic parameters are  indeterminate.  2. Right ventricular systolic function was not well visualized. The right ventricular size is normal. Tricuspid regurgitation signal is inadequate for assessing PA pressure.  3. The mitral valve was not well visualized. No evidence of mitral valve regurgitation. No evidence of mitral stenosis.  4. The aortic valve was not well visualized. Aortic valve regurgitation is not visualized. No aortic stenosis is present.  5. Technically difficult study due to poor echo windows.     Very limited study even with contrast agent. Consider alternative testing. FINDINGS  Left Ventricle: Left ventricular ejection fraction, by estimation, is 25 to 30%. The left ventricle has severely decreased function. Left ventricular endocardial border not  optimally defined to evaluate regional wall motion. Definity contrast agent was given IV to delineate the left ventricular endocardial borders. The left ventricular internal cavity size was normal in size. There is no left ventricular hypertrophy. Left ventricular diastolic parameters are indeterminate. Right Ventricle: The right ventricular size is normal. No increase in right ventricular wall thickness. Right ventricular systolic function was not well visualized. Tricuspid regurgitation signal is inadequate for assessing PA pressure. Left Atrium: Left atrial size was not well visualized. Right Atrium: Right atrial size was not well visualized. Pericardium: There is no evidence of pericardial effusion. Mitral Valve: The mitral valve was not well visualized. No evidence of mitral valve regurgitation. No evidence of mitral valve stenosis. Tricuspid Valve: The tricuspid valve is not well visualized. Tricuspid valve regurgitation is not demonstrated. No evidence of tricuspid stenosis. Aortic Valve: The aortic valve was not well visualized. Aortic valve regurgitation is not visualized. No aortic stenosis is present. Aortic valve mean gradient measures 1.0 mmHg. Aortic valve peak gradient measures 1.4 mmHg. Pulmonic Valve: The pulmonic valve was not well visualized. Pulmonic valve regurgitation is not visualized. No evidence of pulmonic stenosis. Aorta: The aortic root was not well visualized. Venous: The inferior vena cava was not well visualized. IAS/Shunts:  No atrial level shunt detected by color flow Doppler.  LEFT VENTRICLE PLAX 2D LVIDd:         5.23 cm Diastology LVIDs:         4.67 cm LV e' medial:   6.74 cm/s LV PW:         1.22 cm LV E/e' medial: 13.1 LV IVS:        1.18 cm  RIGHT VENTRICLE RV S prime:     5.00 cm/s AORTIC VALVE                   PULMONIC VALVE AV Vmax:           58.80 cm/s  PV Vmax:        0.51 m/s AV Vmean:          40.000 cm/s PV Peak grad:   1.0 mmHg AV VTI:            0.074 m     RVOT Peak  grad: 2 mmHg AV Peak Grad:      1.4 mmHg AV Mean Grad:      1.0 mmHg LVOT Vmax:         26.10 cm/s LVOT Vmean:        22.000 cm/s LVOT VTI:          0.045 m LVOT/AV VTI ratio: 0.61 MITRAL VALVE               TRICUSPID VALVE MV Area (PHT): 4.89 cm    TR Peak grad:   6.8 mmHg MV Decel Time: 155 msec    TR Vmax:        130.00 cm/s MV E velocity: 88.30 cm/s MV A velocity: 51.80 cm/s  SHUNTS MV E/A ratio:  1.70        Systemic VTI: 0.05 m Kathlyn Sacramento MD Electronically signed by Kathlyn Sacramento MD Signature Date/Time: 01/19/2021/2:21:20 PM    Final    ECHO TEE  Result Date: 02/05/2021    TRANSESOPHOGEAL ECHO REPORT   Patient Name:   Anthony Weber Date of Exam: 02/05/2021 Medical Rec #:  [id number]            Height:       70.0 in Accession #:    [id number]           Weight:       296.1 lb Date of Birth:  1945/11/24            BSA:          2.466 m Patient Age:    52 years             BP:           107/62 mmHg Patient Gender: M                    HR:           74 bpm. Exam Location:  Inpatient Procedure: Transesophageal Echo and Color Doppler Indications:     bacteremia  History:         Patient has prior history of Echocardiogram examinations, most                  recent 01/19/2021. CHF; Signs/Symptoms:Bacteremia.  Sonographer:     Johny Chess RDCS Referring Phys:  [id number] DANIEL R BENSIMHON Diagnosing Phys: Glori Bickers MD PROCEDURE: After discussion of the risks and benefits of a TEE, an informed consent was obtained from the  patient. The transesophogeal probe was passed without difficulty through the esophogus of the patient. Imaged were obtained with the patient in a supine position. Sedation performed by different physician. The patient was monitored while under deep sedation. Anesthestetic sedation was provided intravenously by Anesthesiology: 167m of Propofol. Image quality was excellent. The patient developed no  complications during the procedure. IMPRESSIONS  1. Left ventricular ejection  fraction, by estimation, is 25 to 30%. The left ventricle has severely decreased function. The left ventricle demonstrates global hypokinesis. The left ventricular internal cavity size was mildly dilated.  2. Right ventricular systolic function is moderately reduced. The right ventricular size is mildly enlarged.  3. Left atrial size was severely dilated. No left atrial/left atrial appendage thrombus was detected.  4. Right atrial size was severely dilated.  5. No vegetation. The mitral valve is normal in structure. Moderate mitral valve regurgitation.  6. No vegetation.  7. No vegetation. The aortic valve is normal in structure. Aortic valve regurgitation is not visualized.  8. No vegetation.  9. 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 Conclusion(s)/Recommendation(s): No evidence of vegetation/infective endocarditis on this transesophageal echocardiogram. FINDINGS  Left Ventricle: Left ventricular ejection fraction, by estimation, is 25 to 30%. The left ventricle has severely decreased function. The left ventricle demonstrates global hypokinesis. The left ventricular internal cavity size was mildly dilated. Right Ventricle: The right ventricular size is mildly enlarged. No increase in right ventricular wall thickness. Right ventricular systolic function is moderately reduced. Left Atrium: Left atrial size was severely dilated. No left atrial/left atrial appendage thrombus was detected. Right Atrium: Right atrial size was severely dilated. Pericardium: There is no evidence of pericardial effusion. Mitral Valve: No vegetation. The mitral valve is normal in structure. Moderate mitral valve regurgitation, with centrally-directed jet. Tricuspid Valve: No vegetation. The tricuspid valve is normal in structure. Tricuspid valve regurgitation is mild. Aortic Valve: No vegetation. The aortic valve is normal in structure. Aortic valve regurgitation is not visualized. Pulmonic Valve: No vegetation.  The pulmonic valve was normal in structure. Pulmonic valve regurgitation is trivial. Aorta: The aortic root is normal in size and structure. There is moderate (Grade III) plaque involving the descending aorta. IAS/Shunts: No atrial level shunt detected by color flow Doppler. DGlori BickersMD Electronically signed by DGlori BickersMD Signature Date/Time: 02/05/2021/12:45:59 PM    Final    [place] SITE RITE  Result Date: 02/09/2021 If Site Rite image not attached, placement could not be confirmed due to current cardiac rhythm.  [place] SITE RITE  Result Date: 01/27/2021 If Site Rite image not attached, placement could not be confirmed due to current cardiac rhythm.    Discharge Exam: Vitals:   02/10/21 0000 02/10/21 0348  BP:  102/68  Pulse:  67  Resp:  15  Temp:  (!) 97.5 F (36.4 C)  SpO2: 94% 95%   Vitals:   02/09/21 2015 02/09/21 2320 02/10/21 0000 02/10/21 0348  BP: 111/66 (!) 112/52  102/68  Pulse: 69 75  67  Resp: _0 Temp: 97.6 F (36.4 C) 97.7 F (36.5 C)  (!) 97.5 F (36.4 C)  TempSrc: Oral Oral  Oral  SpO2: 94% 95% 94% 95%  Weight:    133.5 kg  Height:        General: Pt is alert, awake, not in acute distress, morbidly obese Cardiovascular: RRR, S1/S2 +, no rubs, no gallops Respiratory: CTA bilaterally, no wheezing, no rhonchi Abdominal: Soft, NT, ND, bowel sounds +  Extremities: +1 pitting edema bilateral lower extremity, no cyanosis    The results of significant diagnostics from this hospitalization (including imaging, microbiology, ancillary and laboratory) are listed below for reference.     Microbiology: Recent Results (from the past 240 hour(s))  Culture, blood (routine x 2)     Status: None   Collection Time: 01/31/21  3:06 PM   Specimen: BLOOD  Result Value Ref Range Status   Specimen Description BLOOD SITE NOT SPECIFIED  Final   Special Requests   Final    BOTTLES DRAWN AEROBIC ONLY Blood Culture results may not be optimal due to an  inadequate volume of blood received in culture bottles   Culture   Final    NO GROWTH 5 DAYS Performed at [facility], [place] [address], [place], [place] [address]    Report Status 02/05/2021 FINAL  Final  Culture, blood (routine x 2)     Status: None   Collection Time: 02/04/21 12:56 PM   Specimen: BLOOD  Result Value Ref Range Status   Specimen Description BLOOD LEFT ANTECUBITAL  Final   Special Requests   Final    BOTTLES DRAWN AEROBIC AND ANAEROBIC Blood Culture adequate volume   Culture   Final    NO GROWTH 5 DAYS Performed at [facility], [place] [address], [place], [place] [address]    Report Status 02/09/2021 FINAL  Final  Culture, blood (routine x 2)     Status: None   Collection Time: 02/04/21  1:03 PM   Specimen: BLOOD  Result Value Ref Range Status   Specimen Description BLOOD LEFT ANTECUBITAL  Final   Special Requests   Final    BOTTLES DRAWN AEROBIC AND ANAEROBIC Blood Culture adequate volume   Culture   Final    NO GROWTH 5 DAYS Performed at [facility], [place] [address] [place], [place] [address]    Report Status 02/09/2021 FINAL  Final  Resp Panel by RT-PCR (Flu A&B, Covid) Nasopharyngeal Swab     Status: None   Collection Time: 02/09/21  8:52 PM   Specimen: Nasopharyngeal Swab; Nasopharyngeal(NP) swabs in vial transport medium  Result Value Ref Range Status   SARS Coronavirus 2 by RT PCR NEGATIVE NEGATIVE Final    Comment: (NOTE) SARS-CoV-2 target nucleic acids are NOT DETECTED.  The SARS-CoV-2 RNA is generally detectable in upper respiratory specimens during the acute phase of infection. The lowest concentration of SARS-CoV-2 viral copies this assay can detect is 138 copies/mL. A negative result does not preclude SARS-Cov-2 infection and should not be used as the sole basis for treatment or other patient management decisions. A negative result may occur with  improper specimen collection/handling, submission of specimen other than  nasopharyngeal swab, presence of viral mutation(s) within the areas targeted by this assay, and inadequate number of viral copies(<138 copies/mL). A negative result must be combined with clinical observations, patient history, and epidemiological information. The expected result is Negative.  Fact Sheet for Patients:  EntrepreneurPulse.com.au  Fact Sheet for Healthcare Providers:  IncredibleEmployment.be  This test is no t yet approved or cleared by the [place] FDA and  has been authorized for detection and/or diagnosis of SARS-CoV-2 by FDA under an Emergency Use Authorization (EUA). This EUA will remain  in effect (meaning this test can be used) for the duration of the COVID-19 declaration under Section 564(b)(1) of the Act, 21 U.S.C.section 360bbb-3(b)(1), unless the authorization is terminated  or revoked sooner.  Influenza A by PCR NEGATIVE NEGATIVE Final   Influenza B by PCR NEGATIVE NEGATIVE Final    Comment: (NOTE) The Xpert Xpress SARS-CoV-2/FLU/RSV plus assay is intended as an aid in the diagnosis of influenza from Nasopharyngeal swab specimens and should not be used as a sole basis for treatment. Nasal washings and aspirates are unacceptable for Xpert Xpress SARS-CoV-2/FLU/RSV testing.  Fact Sheet for Patients: EntrepreneurPulse.com.au  Fact Sheet for Healthcare Providers: IncredibleEmployment.be  This test is not yet approved or cleared by the [place] FDA and has been authorized for detection and/or diagnosis of SARS-CoV-2 by FDA under an Emergency Use Authorization (EUA). This EUA will remain in effect (meaning this test can be used) for the duration of the COVID-19 declaration under Section 564(b)(1) of the Act, 21 U.S.C. section 360bbb-3(b)(1), unless the authorization is terminated or revoked.  Performed at [facility], [place] [address], [place], [place] [address]       Labs: BNP (last 3 results) Recent Labs    01/19/21 1237 01/27/21 0447 01/29/21 2020  BNP 942.4* 681.0* 588.5*   Basic Metabolic Panel: Recent Labs  Lab 02/04/21 0435 02/05/21 0418 02/06/21 0219 02/07/21 0052 02/08/21 0452  NA 127* 127* 128* 130* 130*  K 4.4 4.6 4.3 4.5 4.6  CL 85* 90* 94* 94* 96*  CO2 33* _0 GLUCOSE 100* 92 101* 97 90  BUN 47* 40* 38* 36* 29*  CREATININE 1.01 1.05 0.89 1.07 0.97  CALCIUM 8.2* 8.7* 8.5* 8.6* 8.7*  MG  --  2.2 2.3  --   --   PHOS  --  3.6  --   --   --    Liver Function Tests: Recent Labs  Lab 02/04/21 0435 02/05/21 0418  AST 29 33  ALT 20 17  ALKPHOS 84 93  BILITOT 0.9 0.9  PROT 5.7* 6.0*  ALBUMIN 2.1* 2.4*   No results for input(s): LIPASE, AMYLASE in the last 168 hours. No results for input(s): AMMONIA in the last 168 hours. CBC: Recent Labs  Lab 02/05/21 0418 02/06/21 0219 02/07/21 0052  WBC 13.3* 11.0* 12.5*  NEUTROABS  --  8.0* 9.5*  HGB 13.5 13.3 13.0  HCT 41.1 39.8 39.9  MCV 85.4 85.6 86.2  PLT 277 278 305   Cardiac Enzymes: No results for input(s): CKTOTAL, CKMB, CKMBINDEX, TROPONINI in the last 168 hours. BNP: Invalid input(s): POCBNP CBG: Recent Labs  Lab 02/09/21 1136 02/09/21 1423 02/09/21 1712 02/09/21 2121 02/10/21 0609  GLUCAP 136* 138* 129* 103* 121*   D-Dimer No results for input(s): DDIMER in the last 72 hours. Hgb A1c No results for input(s): HGBA1C in the last 72 hours. Lipid Profile No results for input(s): CHOL, HDL, LDLCALC, TRIG, CHOLHDL, LDLDIRECT in the last 72 hours. Thyroid function studies No results for input(s): TSH, T4TOTAL, T3FREE, THYROIDAB in the last 72 hours.  Invalid input(s): FREET3 Anemia work up No results for input(s): VITAMINB12, FOLATE, FERRITIN, TIBC, IRON, RETICCTPCT in the last 72 hours. Urinalysis    Component Value Date/Time   COLORURINE AMBER (A) 01/29/2021 1505   APPEARANCEUR CLOUDY (A) 01/29/2021 1505   LABSPEC 1.019 01/29/2021  1505   PHURINE 5.0 01/29/2021 1505   GLUCOSEU NEGATIVE 01/29/2021 1505   HGBUR LARGE (A) 01/29/2021 1505   BILIRUBINUR NEGATIVE 01/29/2021 1505   KETONESUR NEGATIVE 01/29/2021 1505   PROTEINUR NEGATIVE 01/29/2021 1505   NITRITE NEGATIVE 01/29/2021 1505   LEUKOCYTESUR MODERATE (A) 01/29/2021 1505   Sepsis Labs Invalid input(s): PROCALCITONIN,  WBC,  LACTICIDVEN Microbiology Recent Results (from the past 240 hour(s))  Culture, blood (routine x 2)     Status: None   Collection Time: 01/31/21  3:06 PM   Specimen: BLOOD  Result Value Ref Range Status   Specimen Description BLOOD SITE NOT SPECIFIED  Final   Special Requests   Final    BOTTLES DRAWN AEROBIC ONLY Blood Culture results may not be optimal due to an inadequate volume of blood received in culture bottles   Culture   Final    NO GROWTH 5 DAYS Performed at [facility], [place] [address], [place], [place] [address]    Report Status 02/05/2021 FINAL  Final  Culture, blood (routine x 2)     Status: None   Collection Time: 02/04/21 12:56 PM   Specimen: BLOOD  Result Value Ref Range Status   Specimen Description BLOOD LEFT ANTECUBITAL  Final   Special Requests   Final    BOTTLES DRAWN AEROBIC AND ANAEROBIC Blood Culture adequate volume   Culture   Final    NO GROWTH 5 DAYS Performed at [facility], [place] [address] [place], [place] [address]    Report Status 02/09/2021 FINAL  Final  Culture, blood (routine x 2)     Status: None   Collection Time: 02/04/21  1:03 PM   Specimen: BLOOD  Result Value Ref Range Status   Specimen Description BLOOD LEFT ANTECUBITAL  Final   Special Requests   Final    BOTTLES DRAWN AEROBIC AND ANAEROBIC Blood Culture adequate volume   Culture   Final    NO GROWTH 5 DAYS Performed at [facility], [place] [address] [place], [place] [address]    Report Status 02/09/2021 FINAL  Final  Resp Panel by RT-PCR (Flu A&B, Covid) Nasopharyngeal Swab     Status: None   Collection Time:  02/09/21  8:52 PM   Specimen: Nasopharyngeal Swab; Nasopharyngeal(NP) swabs in vial transport medium  Result Value Ref Range Status   SARS Coronavirus 2 by RT PCR NEGATIVE NEGATIVE Final    Comment: (NOTE) SARS-CoV-2 target nucleic acids are NOT DETECTED.  The SARS-CoV-2 RNA is generally detectable in upper respiratory specimens during the acute phase of infection. The lowest concentration of SARS-CoV-2 viral copies this assay can detect is 138 copies/mL. A negative result does not preclude SARS-Cov-2 infection and should not be used as the sole basis for treatment or other patient management decisions. A negative result may occur with  improper specimen collection/handling, submission of specimen other than nasopharyngeal swab, presence of viral mutation(s) within the areas targeted by this assay, and inadequate number of viral copies(<138 copies/mL). A negative result must be combined with clinical observations, patient history, and epidemiological information. The expected result is Negative.  Fact Sheet for Patients:  EntrepreneurPulse.com.au  Fact Sheet for Healthcare Providers:  IncredibleEmployment.be  This test is no t yet approved or cleared by the [place] FDA and  has been authorized for detection and/or diagnosis of SARS-CoV-2 by FDA under an Emergency Use Authorization (EUA). This EUA will remain  in effect (meaning this test can be used) for the duration of the COVID-19 declaration under Section 564(b)(1) of the Act, 21 U.S.C.section 360bbb-3(b)(1), unless the authorization is terminated  or revoked sooner.       Influenza A by PCR NEGATIVE NEGATIVE Final   Influenza B by PCR NEGATIVE NEGATIVE Final    Comment: (NOTE) The Xpert Xpress SARS-CoV-2/FLU/RSV plus assay is intended as  an aid in the diagnosis of influenza from Nasopharyngeal swab specimens and should not be used as a sole basis for treatment. Nasal washings  and aspirates are unacceptable for Xpert Xpress SARS-CoV-2/FLU/RSV testing.  Fact Sheet for Patients: EntrepreneurPulse.com.au  Fact Sheet for Healthcare Providers: IncredibleEmployment.be  This test is not yet approved or cleared by the [place] FDA and has been authorized for detection and/or diagnosis of SARS-CoV-2 by FDA under an Emergency Use Authorization (EUA). This EUA will remain in effect (meaning this test can be used) for the duration of the COVID-19 declaration under Section 564(b)(1) of the Act, 21 U.S.C. section 360bbb-3(b)(1), unless the authorization is terminated or revoked.  Performed at [facility], [place] [address], [place], [place] [address]      Time coordinating discharge: Over 30 minutes  SIGNED:   Darliss Cheney, MD  [facility] 02/10/2021, 9:38 AM  If 7PM-7AM, please contact night-coverage [email or web address]

## 2021-02-11 LAB — GLUCOSE, CAPILLARY
Glucose-Capillary: 107 mg/dL — ABNORMAL HIGH (ref 70–99)
Glucose-Capillary: 117 mg/dL — ABNORMAL HIGH (ref 70–99)

## 2021-02-11 NOTE — Progress Notes (Signed)
Mobility Specialist: Progress Note   02/11/21 1212  Mobility  Activity Ambulated in hall  Level of Assistance Moderate assist, patient does 50-74%  Assistive Device Front wheel walker  Distance Ambulated (ft) 100 ft  Mobility Ambulated with assistance in hallway  Mobility Response Tolerated well  Mobility performed by Mobility specialist  $Mobility charge 1 Mobility   Pre-Mobility: 74 HR, 115/72 BP Post-Mobility: 89 HR  Pt sitting in the chair upon entering room and agreeable to mobility session. Pt required modA to stand from the chair and contact guard throughout ambulation. Pt to the bed after walk per request with call bell at his side and family present in the room.   [facility] Annelies Coyt Mobility Specialist Mobility Specialist Phone: [phone]

## 2021-02-11 NOTE — Progress Notes (Signed)
Pt being D/C, VSS, education handed to PTAR. PICC line in place for longterm IV antibiotics @ SNF.   Chrisandra Carota, RN 02/11/2021 1:13 PM

## 2021-02-11 NOTE — Discharge Summary (Addendum)
Physician Discharge Summary  Anthony Weber JJH:[id number] DOB: 1945-11-22 DOA: 01/29/2021  PCP: Albina Billet, MD  Admit date: 01/29/2021 Discharge date: 02/11/2021 30 Day Unplanned Readmission Risk Score    Flowsheet Row Admission (Current) from 01/29/2021 in [facility] 4E CV SURGICAL PROGRESSIVE CARE  30 Day Unplanned Readmission Risk Score (%) 19.77 Filed at 02/10/2021 0400       This score is the patient's risk of an unplanned readmission within 30 days of being discharged (0 -100%). The score is based on dignosis, age, lab data, medications, orders, and past utilization.   Low:  0-14.9   Medium: 15-21.9   High: 22-29.9   Extreme: 30 and above          Admitted From: Home Disposition: SNF  Recommendations for Outpatient Follow-up:  Follow up with PCP in 1-2 weeks Please obtain BMP/CBC in one week Follow-up with cardiology in 1 to 2 weeks Follow-up with ID in 2 to 4 weeks We recommend outpatient sleep study. Please follow up with your PCP on the following pending results: Unresulted Labs (From admission, onward)    None         Home Health: None Equipment/Devices: None  Discharge Condition: Stable CODE STATUS: Full code Diet recommendation: Cardiac, low-sodium  Subjective: Seen and examined.  He has no complaints.  He is happy that he is being discharged from hospital today.  BP 121/80 (BP Location: Left Arm)   Pulse 75   Temp 97.7 F (36.5 C) (Oral)   Resp 16   Ht _0  (1.778 m)   Wt 132.3 kg   SpO2 95%   BMI 41.85 kg/m  Physical Exam Vitals and nursing note reviewed.  Constitutional:      General: He is not in acute distress.    Appearance: He is obese. He is not ill-appearing, toxic-appearing or diaphoretic.  HENT:     Head: Normocephalic and atraumatic.     Nose: Nose normal.  Cardiovascular:     Rate and Rhythm: Normal rate and regular rhythm.     Pulses: Normal pulses.  Pulmonary:     Effort: Pulmonary effort is normal. No respiratory  distress.     Breath sounds: Normal breath sounds. No wheezing.  Abdominal:     General: Abdomen is protuberant. Bowel sounds are normal. There is no distension.     Tenderness: There is no abdominal tenderness.  Skin:    General: Skin is warm and dry.     Capillary Refill: Capillary refill takes less than 2 seconds.     Comments: Venous stasis dermatitis left lower leg  Right UE PICC line. No purulence at insertion site.  Neurological:     Mental Status: He is alert and oriented to person, place, and time.     Brief/Interim Summary: 75 yo M PMH morbid obesity, HTN, HLD, hypothyroidism, HFrEF who presented to [facility] ED 10/9 with CC BLE swelling. He reportedly gained 15lb over 76wk prior to presentation, had incr DOE. He was admitted to hospitalist service for acute heart failure exacerbation. Cardiology was consulted 10/10 and recommended diuresis, lisonopril and repeat ECHO which revealed LVEF 25-30% with indeterminate LV diastolic parameters and poorly visualized RV systolic function.  He received ongoing medication optimization and went for [facility] 10/18 which showed no significant CAD, moderately elevated L heart and PA pressure, severely elevated R Heart pressures. He was transferred to the ICU 10/18 and started on a milrinone infusion for cardiogenic shock.   On 10/20 the pt was started  on a lasix gtt for hypoxia in setting of cardiogenic shock and a plan was made to transfer the patient to [facility] for further management and care.  He was admitted to ICU and eventually was transitioned to [facility] care on 02/05/2021.   Significant Hospital Events: Including procedures, antibiotic start and stop dates in addition to other pertinent events   10/9 admitted to [facility] with likely HF exacerbation. Diuresed  10/10 cards consulted for HF recs  10/18 R/LHC without significant CAD, but had incr L heart, PA, and R heart pressures. Transferred to ICU, started on milronone> developed hypotension and  NSVT> milrinone reduced and started norepi and amiodarone.  10/20 Transferred to [facility] 10/21 MSSA in blood cultures, A-line placed and removed overnight 10/24 pressors weaned off.  Transferred under [place] again on 02/05/2021   Shock - Cardiogenic/Septic/MSSA bacteremia - Initial presentation consistent with cardiogenic shock but now with septic overlay with PNA and MSSA bacteremia - BCx on 01/30/2021, 2/2 MSSA. Urine Cx negative, A-line placed and removed overnight.  Blood cultures since then have been negative.  Once again central line removed on 02/04/2021 and blood cultures drawn which have remained negative to date.  ID was managing antibiotics.  Currently on Ancef.   Fortunately, TEE was negative for any endocarditis, ID recommended continuing current antibiotics with end date for cefazolin 03/09/2021.  Patient will follow-up with ID as outpatient.   Acute Systolic Heart Failure  - Echo EF now down from 55-->20-25%. Cath with no CAD, elevated filling pressures and low output heart failure.  - Off milrinone. Volume status ok.  -- No room for GDMT with hypotension.  Heart failure team signed off with recommendations to continue following medications. Torsemide 40 mg daily  Midodrine 10 mg tid  Amiodarone 200 mg daily Mexilitene 150 mg twice a day  Pravastatin 10 mg daily    Acute Hypoxic/Hypercarbia Respiratory Failure  - Improved with volume removal. Continue to wean oxygen as needed.  Currently stable and saturating about 95% on room air.   NSVT, PVC  Continue amio. Mexilitene added 10/26   Keep K> 4.0 Mg>2.0 - outpatient sleep study.     AKI  - Creatinine peaked at 2.1 , now resolved.   Hyponatremia Stable.   Morbid Obesity   Body mass index is 42.48 kg/m.   AMS - ABC with CO2 retention, resolved, currently fully alert and oriented.   Suspected Sleep Apnea -will need outpatient sleep study.  Patient tolerated CPAP well.  Plan to discharge on CPAP.   Hypothyroidism   Continue Synthroid.   Discharge Diagnoses:  Principal Problem:   MSSA bacteremia Active Problems:   Cardiogenic shock ([place])   Central line infection   Pneumonia of both lower lobes due to methicillin susceptible Staphylococcus aureus (MSSA) (HCC)   Hyponatremia   Acute on chronic combined systolic and diastolic congestive heart failure (HCC)   Acute on chronic respiratory failure with hypoxia and hypercapnia [facility])    Discharge Instructions  Discharge Instructions     [facility] pharmacist to adjust dose for Vancomycin, Aminoglycosides and other anti-infective therapies as requested by physician.   Complete by: As directed    [facility] to provide Cath Flo 74m   Complete by: As directed    Administer for PICC line occlusion and as ordered by physician for other access device issues.   Anaphylaxis Kit: Provided to treat any anaphylactic reaction to the medication being provided to the patient if First Dose or when requested  by physician   Complete by: As directed    Epinephrine 69m/ml vial / amp: Administer 0.314m(0.12m39msubcutaneously once for moderate to severe anaphylaxis, nurse to call physician and pharmacy when reaction occurs and call 911 if needed for immediate care   Diphenhydramine 50m40m IV vial: Administer 25-50mg18mIM PRN for first dose reaction, rash, itching, mild reaction, nurse to call physician and pharmacy when reaction occurs   Sodium Chloride 0.9% NS 500ml 31mAdminister if needed for hypovolemic blood pressure drop or as ordered by physician after call to physician with anaphylactic reaction   Change dressing on IV access line weekly and PRN   Complete by: As directed    Flush IV access with Sodium Chloride 0.9% and Heparin 10 units/ml or 100 units/ml   Complete by: As directed    Home infusion instructions - [facility]   Complete by: As directed    Instructions: Flush IV access with Sodium Chloride 0.9% and Heparin  10units/ml or 100units/ml   Change dressing on IV access line: Weekly and PRN   Instructions Cath Flo 2mg: A106mnister for PICC Line occlusion and as ordered by physician for other access device   [facility] pharmacist to adjust dose for: Vancomycin, Aminoglycosides and other anti-infective therapies as requested by physician   Method of administration may be changed at the discretion of home infusion pharmacist based upon assessment of the patient and/or caregiver's ability to self-administer the medication ordered   Complete by: As directed       Allergies as of 02/11/2021   No Known Allergies      Medication List     STOP taking these medications    amiodarone 360-4.14 MG/200ML-% Soln Commonly known as: NEXTERONE PREMIX   lisinopril 40 MG tablet Commonly known as: ZESTRIL   milrinone 20 MG/100 ML Soln infusion Commonly known as: PRIMACOR   norepinephrine 4-5 MG/250ML-% Soln Commonly known as: LEVOPHED       TAKE these medications    amiodarone 200 MG tablet Commonly known as: PACERONE Take 1 tablet (200 mg total) by mouth daily.   aspirin 325 MG tablet Take 325 mg by mouth daily.   ceFAZolin  IVPB Commonly known as: ANCEF Inject 2 g into the vein every 8 (eight) hours for 26 days. Indication:  MSSA Bacteremia First Dose: Yes Last Day of Therapy:  03/09/21 Labs - Once weekly:  CBC/D and BMP, Labs - Every other week:  ESR and CRP Method of administration: IV Push Method of administration may be changed at the discretion of home infusion pharmacist based upon assessment of the patient and/or caregiver's ability to self-administer the medication ordered.   folic acid 400 MCG854blet Commonly known as: FOLVITE Take 400 mcg by mouth daily.   levothyroxine 50 MCG tablet Commonly known as: SYNTHROID Take 50 mcg by mouth daily before breakfast.   mexiletine 150 MG capsule Commonly known as: MEXITIL Take 1 capsule (150 mg total) by mouth every 12  (twelve) hours.   midodrine 10 MG tablet Commonly known as: PROAMATINE Take 1 tablet (10 mg total) by mouth 3 (three) times daily with meals.   pravastatin 10 MG tablet Commonly known as: PRAVACHOL Take 10 mg by mouth daily.   Torsemide 40 MG Tabs Take 40 mg by mouth daily.   vitamin B-12 100 MCG tablet Commonly known as: CYANOCOBALAMIN Take 100 mcg by mouth daily.   vitamin C 1000 MG tablet Take 1,000 mg by mouth daily.   Vitamin D3  125 MCG (5000 UT) Caps Take 1 capsule by mouth daily.               Durable Medical Equipment  (From admission, onward)           Start     Ordered   02/09/21 1313  For home use only DME continuous positive airway pressure (CPAP)  Once       Comments: 4-20  Question Answer Comment  Length of Need Lifetime   Patient has OSA or probable OSA Yes   Is the patient currently using CPAP in the home No   Settings Other see comments   CPAP supplies needed Mask, headgear, cushions, filters, heated tubing and water chamber      02/09/21 1313   02/04/21 0909  For home use only DME Bipap  Once       Question:  Length of Need  Answer:  Lifetime   02/04/21 0908              Discharge Care Instructions  (From admission, onward)           Start     Ordered   02/10/21 0000  Change dressing on IV access line weekly and PRN  (Home infusion instructions - [facility] )        02/10/21 0733            Follow-up Information     [place] Follow up.   Specialty: Cardiology Why: 02/17/21 :30 PM [facility] Luvenia Heller Contact information: [address] [id number] [place]        Albina Billet, MD Follow up in 1 week(s).   Specialty: Internal Medicine Contact information: [address]   [place] [address] [phone]         Minna Merritts, MD .    Specialty: Cardiology Contact information: [place] [address] [phone]                No Known Allergies  Consultations: ID, cardiology   Procedures/Studies: DG Chest 1 View  Result Date: 01/27/2021 CLINICAL DATA:  Central line placement EXAM: CHEST  1 VIEW COMPARISON:  01/18/2021 FINDINGS: Interval placement of right IJ central venous catheter with tip over the SVC. No visible right pneumothorax. Cardiomegaly with vascular congestion. Probable small left effusion with left basilar airspace disease. IMPRESSION: 1. Right IJ central venous catheter tip over the SVC. No pneumothorax 2. Cardiomegaly with mild central congestion 3. Possible small left effusion with mild airspace disease at left base. Electronically Signed   By: Donavan Foil M.D.   On: 01/27/2021 22:00   CARDIAC CATHETERIZATION  Result Date: 01/27/2021 Conclusions: No angiographically significant coronary artery disease. Moderately elevated left heart and pulmonary artery pressures. Severely elevated right heart filling pressure. Severely reduced Fick cardiac output/index. Recommendations: Transfer to stepdown for initiation of milrinone infusion in the setting of low output heart failure. Decrease carvedilol and losartan with initiation of milrinone and borderline hypotension. Diurese as renal function tolerates following initiation of milrinone. Primary prevention of coronary artery disease. Nelva Bush, MD [facility]  DG CHEST PORT 1 VIEW  Result Date: 02/03/2021 CLINICAL DATA:  Central line placement EXAM: PORTABLE CHEST 1 VIEW COMPARISON:  01/29/2021 FINDINGS: Right central line in place with the tip in the SVC. No pneumothorax. Cardiomegaly with vascular congestion. Left lower  lobe consolidation with probable left effusion. No confluent opacity on the right. IMPRESSION: Right central line tip in the SVC.  No pneumothorax. Cardiomegaly, vascular congestion. Left lower  lobe airspace opacity with left effusion. Cannot exclude pneumonia. Electronically Signed   By: Rolm Baptise M.D.   On: 02/03/2021 19:06   DG Chest Port 1 View  Result Date: 01/29/2021 CLINICAL DATA:  Dyspnea EXAM: PORTABLE CHEST 1 VIEW COMPARISON:  01/27/2021 FINDINGS: Right jugular central venous catheter tip near the cavoatrial junction unchanged. No pneumothorax Bibasilar airspace disease left greater than right unchanged. No significant effusion. IMPRESSION: Central line in the cavoatrial junction Bibasilar airspace disease left greater than right unchanged. Electronically Signed   By: Franchot Gallo M.D.   On: 01/29/2021 14:54   DG Chest Port 1 View  Result Date: 01/18/2021 CLINICAL DATA:  Shortness of breath, leg swelling EXAM: PORTABLE CHEST 1 VIEW COMPARISON:  06/23/2011 FINDINGS: Mild cardiomegaly, vascular congestion. Left basilar atelectasis or infiltrate. Suspect small left effusion. No overt edema. No acute bony abnormality. IMPRESSION: Borderline cardiomegaly, vascular congestion. Left lower lobe atelectasis or infiltrate.  Small left effusion. Electronically Signed   By: Rolm Baptise M.D.   On: 01/18/2021 17:09   ECHOCARDIOGRAM COMPLETE  Result Date: 01/19/2021    ECHOCARDIOGRAM REPORT   Patient Name:   Anthony Weber Date of Exam: 01/19/2021 Medical Rec #:  [id number]            Height:       70.0 in Accession #:    [id number]           Weight:       346.1 lb Date of Birth:  15-Mar-1946            BSA:          2.635 m Patient Age:    38 years             BP:           111/56 mmHg Patient Gender: M                    HR:           62 bpm. Exam Location:  [facility] Procedure: 2D Echo, Cardiac Doppler, Color Doppler and Intracardiac            Opacification Agent Indications:     CHF I50.9  History:         Patient has prior history of Echocardiogram examinations, most                  recent 04/18/2020. Risk Factors:Hypertension.  Sonographer:     Sherrie Sport Referring Phys:  [id number]  Robin Searing VISSER Diagnosing Phys: Kathlyn Sacramento MD  Sonographer Comments: Technically difficult study due to poor echo windows. IMPRESSIONS  1. Left ventricular ejection fraction, by estimation, is 25 to 30%. The left ventricle has severely decreased function. Left ventricular endocardial border not optimally defined to evaluate regional wall motion. Left ventricular diastolic parameters are  indeterminate.  2. Right ventricular systolic function was not well visualized. The right ventricular size is normal. Tricuspid regurgitation signal is inadequate for assessing PA pressure.  3. The mitral valve was not well visualized. No evidence of mitral valve regurgitation. No evidence of mitral stenosis.  4. The aortic valve was not well visualized. Aortic valve regurgitation is not visualized. No aortic stenosis is present.  5. Technically difficult study due to poor echo windows.     Very  limited study even with contrast agent. Consider alternative testing. FINDINGS  Left Ventricle: Left ventricular ejection fraction, by estimation, is 25 to 30%. The left ventricle has severely decreased function. Left ventricular endocardial border not optimally defined to evaluate regional wall motion. Definity contrast agent was given IV to delineate the left ventricular endocardial borders. The left ventricular internal cavity size was normal in size. There is no left ventricular hypertrophy. Left ventricular diastolic parameters are indeterminate. Right Ventricle: The right ventricular size is normal. No increase in right ventricular wall thickness. Right ventricular systolic function was not well visualized. Tricuspid regurgitation signal is inadequate for assessing PA pressure. Left Atrium: Left atrial size was not well visualized. Right Atrium: Right atrial size was not well visualized. Pericardium: There is no evidence of pericardial effusion. Mitral Valve: The mitral valve was not well visualized. No evidence of mitral valve  regurgitation. No evidence of mitral valve stenosis. Tricuspid Valve: The tricuspid valve is not well visualized. Tricuspid valve regurgitation is not demonstrated. No evidence of tricuspid stenosis. Aortic Valve: The aortic valve was not well visualized. Aortic valve regurgitation is not visualized. No aortic stenosis is present. Aortic valve mean gradient measures 1.0 mmHg. Aortic valve peak gradient measures 1.4 mmHg. Pulmonic Valve: The pulmonic valve was not well visualized. Pulmonic valve regurgitation is not visualized. No evidence of pulmonic stenosis. Aorta: The aortic root was not well visualized. Venous: The inferior vena cava was not well visualized. IAS/Shunts: No atrial level shunt detected by color flow Doppler.  LEFT VENTRICLE PLAX 2D LVIDd:         5.23 cm Diastology LVIDs:         4.67 cm LV e' medial:   6.74 cm/s LV PW:         1.22 cm LV E/e' medial: 13.1 LV IVS:        1.18 cm  RIGHT VENTRICLE RV S prime:     5.00 cm/s AORTIC VALVE                   PULMONIC VALVE AV Vmax:           58.80 cm/s  PV Vmax:        0.51 m/s AV Vmean:          40.000 cm/s PV Peak grad:   1.0 mmHg AV VTI:            0.074 m     RVOT Peak grad: 2 mmHg AV Peak Grad:      1.4 mmHg AV Mean Grad:      1.0 mmHg LVOT Vmax:         26.10 cm/s LVOT Vmean:        22.000 cm/s LVOT VTI:          0.045 m LVOT/AV VTI ratio: 0.61 MITRAL VALVE               TRICUSPID VALVE MV Area (PHT): 4.89 cm    TR Peak grad:   6.8 mmHg MV Decel Time: 155 msec    TR Vmax:        130.00 cm/s MV E velocity: 88.30 cm/s MV A velocity: 51.80 cm/s  SHUNTS MV E/A ratio:  1.70        Systemic VTI: 0.05 m Kathlyn Sacramento MD Electronically signed by Kathlyn Sacramento MD Signature Date/Time: 01/19/2021/2:21:20 PM    Final    ECHO TEE  Result Date: 02/05/2021    TRANSESOPHOGEAL ECHO REPORT   Patient Name:  Anthony Weber Date of Exam: 02/05/2021 Medical Rec #:  [id number]            Height:       70.0 in Accession #:    [id number]           Weight:        296.1 lb Date of Birth:  May 08, 1945            BSA:          2.466 m Patient Age:    27 years             BP:           107/62 mmHg Patient Gender: M                    HR:           74 bpm. Exam Location:  Inpatient Procedure: Transesophageal Echo and Color Doppler Indications:     bacteremia  History:         Patient has prior history of Echocardiogram examinations, most                  recent 01/19/2021. CHF; Signs/Symptoms:Bacteremia.  Sonographer:     Johny Chess RDCS Referring Phys:  [id number] DANIEL R BENSIMHON Diagnosing Phys: Glori Bickers MD PROCEDURE: After discussion of the risks and benefits of a TEE, an informed consent was obtained from the patient. The transesophogeal probe was passed without difficulty through the esophogus of the patient. Imaged were obtained with the patient in a supine position. Sedation performed by different physician. The patient was monitored while under deep sedation. Anesthestetic sedation was provided intravenously by Anesthesiology: 144m of Propofol. Image quality was excellent. The patient developed no  complications during the procedure. IMPRESSIONS  1. Left ventricular ejection fraction, by estimation, is 25 to 30%. The left ventricle has severely decreased function. The left ventricle demonstrates global hypokinesis. The left ventricular internal cavity size was mildly dilated.  2. Right ventricular systolic function is moderately reduced. The right ventricular size is mildly enlarged.  3. Left atrial size was severely dilated. No left atrial/left atrial appendage thrombus was detected.  4. Right atrial size was severely dilated.  5. No vegetation. The mitral valve is normal in structure. Moderate mitral valve regurgitation.  6. No vegetation.  7. No vegetation. The aortic valve is normal in structure. Aortic valve regurgitation is not visualized.  8. No vegetation.  9. 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  Conclusion(s)/Recommendation(s): No evidence of vegetation/infective endocarditis on this transesophageal echocardiogram. FINDINGS  Left Ventricle: Left ventricular ejection fraction, by estimation, is 25 to 30%. The left ventricle has severely decreased function. The left ventricle demonstrates global hypokinesis. The left ventricular internal cavity size was mildly dilated. Right Ventricle: The right ventricular size is mildly enlarged. No increase in right ventricular wall thickness. Right ventricular systolic function is moderately reduced. Left Atrium: Left atrial size was severely dilated. No left atrial/left atrial appendage thrombus was detected. Right Atrium: Right atrial size was severely dilated. Pericardium: There is no evidence of pericardial effusion. Mitral Valve: No vegetation. The mitral valve is normal in structure. Moderate mitral valve regurgitation, with centrally-directed jet. Tricuspid Valve: No vegetation. The tricuspid valve is normal in structure. Tricuspid valve regurgitation is mild. Aortic Valve: No vegetation. The aortic valve is normal in structure. Aortic valve regurgitation is not visualized. Pulmonic Valve: No vegetation. The pulmonic valve was normal in structure. Pulmonic valve  regurgitation is trivial. Aorta: The aortic root is normal in size and structure. There is moderate (Grade III) plaque involving the descending aorta. IAS/Shunts: No atrial level shunt detected by color flow Doppler. Glori Bickers MD Electronically signed by Glori Bickers MD Signature Date/Time: 02/05/2021/12:45:59 PM    Final    [place] EKG SITE RITE  Result Date: 02/09/2021 If Site Rite image not attached, placement could not be confirmed due to current cardiac rhythm.  [place] EKG SITE RITE  Result Date: 01/27/2021 If Site Rite image not attached, placement could not be confirmed due to current cardiac rhythm.    Discharge Exam: Vitals:   02/10/21 2336 02/11/21 0444  BP: 109/63 113/64   Pulse: 75 71  Resp: 17 14  Temp: 97.8 F (36.6 C) 97.7 F (36.5 C)  SpO2: 94% 97%   Vitals:   02/10/21 1625 02/10/21 1934 02/10/21 2336 02/11/21 0444  BP: 118/71 104/68 109/63 113/64  Pulse: 67 72 75 71  Resp: _0 Temp: 97.6 F (36.4 C) 97.6 F (36.4 C) 97.8 F (36.6 C) 97.7 F (36.5 C)  TempSrc: Oral Oral Oral Oral  SpO2: 94% 96% 94% 97%  Weight:    132.3 kg  Height:        General: Pt is alert, awake, not in acute distress, morbidly obese Cardiovascular: RRR, S1/S2 +, no rubs, no gallops Respiratory: CTA bilaterally, no wheezing, no rhonchi Abdominal: Soft, NT, ND, bowel sounds + Extremities: +1 pitting edema bilateral lower extremity, no cyanosis    The results of significant diagnostics from this hospitalization (including imaging, microbiology, ancillary and laboratory) are listed below for reference.     Microbiology: Recent Results (from the past 240 hour(s))  Culture, blood (routine x 2)     Status: None   Collection Time: 02/04/21 12:56 PM   Specimen: BLOOD  Result Value Ref Range Status   Specimen Description BLOOD LEFT ANTECUBITAL  Final   Special Requests   Final    BOTTLES DRAWN AEROBIC AND ANAEROBIC Blood Culture adequate volume   Culture   Final    NO GROWTH 5 DAYS Performed at [facility], [address] [place], [place] [address]    Report Status 02/09/2021 FINAL  Final  Culture, blood (routine x 2)     Status: None   Collection Time: 02/04/21  1:03 PM   Specimen: BLOOD  Result Value Ref Range Status   Specimen Description BLOOD LEFT ANTECUBITAL  Final   Special Requests   Final    BOTTLES DRAWN AEROBIC AND ANAEROBIC Blood Culture adequate volume   Culture   Final    NO GROWTH 5 DAYS Performed at [facility], [place] [address] [place], [place] [address]    Report Status 02/09/2021 FINAL  Final  Resp Panel by RT-PCR (Flu A&B, Covid) Nasopharyngeal Swab     Status: None   Collection Time: 02/09/21  8:52 PM    Specimen: Nasopharyngeal Swab; Nasopharyngeal(NP) swabs in vial transport medium  Result Value Ref Range Status   SARS Coronavirus 2 by RT PCR NEGATIVE NEGATIVE Final    Comment: (NOTE) SARS-CoV-2 target nucleic acids are NOT DETECTED.  The SARS-CoV-2 RNA is generally detectable in upper respiratory specimens during the acute phase of infection. The lowest concentration of SARS-CoV-2 viral copies this assay can detect is 138 copies/mL. A negative result does not preclude SARS-Cov-2 infection and should not be used as the sole basis for treatment or other patient management decisions. A  negative result may occur with  improper specimen collection/handling, submission of specimen other than nasopharyngeal swab, presence of viral mutation(s) within the areas targeted by this assay, and inadequate number of viral copies(<138 copies/mL). A negative result must be combined with clinical observations, patient history, and epidemiological information. The expected result is Negative.  Fact Sheet for Patients:  EntrepreneurPulse.com.au  Fact Sheet for Healthcare Providers:  IncredibleEmployment.be  This test is no t yet approved or cleared by the [place] FDA and  has been authorized for detection and/or diagnosis of SARS-CoV-2 by FDA under an Emergency Use Authorization (EUA). This EUA will remain  in effect (meaning this test can be used) for the duration of the COVID-19 declaration under Section 564(b)(1) of the Act, 21 U.S.C.section 360bbb-3(b)(1), unless the authorization is terminated  or revoked sooner.       Influenza A by PCR NEGATIVE NEGATIVE Final   Influenza B by PCR NEGATIVE NEGATIVE Final    Comment: (NOTE) The Xpert Xpress SARS-CoV-2/FLU/RSV plus assay is intended as an aid in the diagnosis of influenza from Nasopharyngeal swab specimens and should not be used as a sole basis for treatment. Nasal washings and aspirates are  unacceptable for Xpert Xpress SARS-CoV-2/FLU/RSV testing.  Fact Sheet for Patients: EntrepreneurPulse.com.au  Fact Sheet for Healthcare Providers: IncredibleEmployment.be  This test is not yet approved or cleared by the [place] FDA and has been authorized for detection and/or diagnosis of SARS-CoV-2 by FDA under an Emergency Use Authorization (EUA). This EUA will remain in effect (meaning this test can be used) for the duration of the COVID-19 declaration under Section 564(b)(1) of the Act, 21 U.S.C. section 360bbb-3(b)(1), unless the authorization is terminated or revoked.  Performed at [facility], [place] [address] [place], [place] [address]      Labs: BNP (last 3 results) Recent Labs    01/19/21 1237 01/27/21 0447 01/29/21 2020  BNP 942.4* 681.0* 403.7*   Basic Metabolic Panel: Recent Labs  Lab 02/05/21 0418 02/06/21 0219 02/07/21 0052 02/08/21 0452  NA 127* 128* 130* 130*  K 4.6 4.3 4.5 4.6  CL 90* 94* 94* 96*  CO2 _0 GLUCOSE 92 101* 97 90  BUN 40* 38* 36* 29*  CREATININE 1.05 0.89 1.07 0.97  CALCIUM 8.7* 8.5* 8.6* 8.7*  MG 2.2 2.3  --   --   PHOS 3.6  --   --   --    Liver Function Tests: Recent Labs  Lab 02/05/21 0418  AST 33  ALT 17  ALKPHOS 93  BILITOT 0.9  PROT 6.0*  ALBUMIN 2.4*   No results for input(s): LIPASE, AMYLASE in the last 168 hours. No results for input(s): AMMONIA in the last 168 hours. CBC: Recent Labs  Lab 02/05/21 0418 02/06/21 0219 02/07/21 0052  WBC 13.3* 11.0* 12.5*  NEUTROABS  --  8.0* 9.5*  HGB 13.5 13.3 13.0  HCT 41.1 39.8 39.9  MCV 85.4 85.6 86.2  PLT 277 278 305   Cardiac Enzymes: No results for input(s): CKTOTAL, CKMB, CKMBINDEX, TROPONINI in the last 168 hours. BNP: Invalid input(s): POCBNP CBG: Recent Labs  Lab 02/10/21 0609 02/10/21 1202 02/10/21 1627 02/10/21 2108 02/11/21 0611  GLUCAP 121* 117* 107* 126* 107*   D-Dimer No results for  input(s): DDIMER in the last 72 hours. Hgb A1c No results for input(s): HGBA1C in the last 72 hours. Lipid Profile No results for input(s): CHOL, HDL, LDLCALC, TRIG, CHOLHDL, LDLDIRECT in the last 72 hours. Thyroid  function studies No results for input(s): TSH, T4TOTAL, T3FREE, THYROIDAB in the last 72 hours.  Invalid input(s): FREET3 Anemia work up No results for input(s): VITAMINB12, FOLATE, FERRITIN, TIBC, IRON, RETICCTPCT in the last 72 hours. Urinalysis    Component Value Date/Time   COLORURINE AMBER (A) 01/29/2021 1505   APPEARANCEUR CLOUDY (A) 01/29/2021 1505   LABSPEC 1.019 01/29/2021 1505   PHURINE 5.0 01/29/2021 1505   GLUCOSEU NEGATIVE 01/29/2021 1505   HGBUR LARGE (A) 01/29/2021 1505   BILIRUBINUR NEGATIVE 01/29/2021 1505   KETONESUR NEGATIVE 01/29/2021 1505   PROTEINUR NEGATIVE 01/29/2021 1505   NITRITE NEGATIVE 01/29/2021 1505   LEUKOCYTESUR MODERATE (A) 01/29/2021 1505   Sepsis Labs Invalid input(s): PROCALCITONIN,  WBC,  LACTICIDVEN Microbiology Recent Results (from the past 240 hour(s))  Culture, blood (routine x 2)     Status: None   Collection Time: 02/04/21 12:56 PM   Specimen: BLOOD  Result Value Ref Range Status   Specimen Description BLOOD LEFT ANTECUBITAL  Final   Special Requests   Final    BOTTLES DRAWN AEROBIC AND ANAEROBIC Blood Culture adequate volume   Culture   Final    NO GROWTH 5 DAYS Performed at [facility], [place] [address] [place], [place] [address]    Report Status 02/09/2021 FINAL  Final  Culture, blood (routine x 2)     Status: None   Collection Time: 02/04/21  1:03 PM   Specimen: BLOOD  Result Value Ref Range Status   Specimen Description BLOOD LEFT ANTECUBITAL  Final   Special Requests   Final    BOTTLES DRAWN AEROBIC AND ANAEROBIC Blood Culture adequate volume   Culture   Final    NO GROWTH 5 DAYS Performed at [facility], [place] [address] [place], [place] [address]    Report Status 02/09/2021 FINAL  Final   Resp Panel by RT-PCR (Flu A&B, Covid) Nasopharyngeal Swab     Status: None   Collection Time: 02/09/21  8:52 PM   Specimen: Nasopharyngeal Swab; Nasopharyngeal(NP) swabs in vial transport medium  Result Value Ref Range Status   SARS Coronavirus 2 by RT PCR NEGATIVE NEGATIVE Final    Comment: (NOTE) SARS-CoV-2 target nucleic acids are NOT DETECTED.  The SARS-CoV-2 RNA is generally detectable in upper respiratory specimens during the acute phase of infection. The lowest concentration of SARS-CoV-2 viral copies this assay can detect is 138 copies/mL. A negative result does not preclude SARS-Cov-2 infection and should not be used as the sole basis for treatment or other patient management decisions. A negative result may occur with  improper specimen collection/handling, submission of specimen other than nasopharyngeal swab, presence of viral mutation(s) within the areas targeted by this assay, and inadequate number of viral copies(<138 copies/mL). A negative result must be combined with clinical observations, patient history, and epidemiological information. The expected result is Negative.  Fact Sheet for Patients:  EntrepreneurPulse.com.au  Fact Sheet for Healthcare Providers:  IncredibleEmployment.be  This test is no t yet approved or cleared by the [place] FDA and  has been authorized for detection and/or diagnosis of SARS-CoV-2 by FDA under an Emergency Use Authorization (EUA). This EUA will remain  in effect (meaning this test can be used) for the duration of the COVID-19 declaration under Section 564(b)(1) of the Act, 21 U.S.C.section 360bbb-3(b)(1), unless the authorization is terminated  or revoked sooner.       Influenza A by PCR NEGATIVE NEGATIVE Final   Influenza B by PCR NEGATIVE NEGATIVE  Final    Comment: (NOTE) The Xpert Xpress SARS-CoV-2/FLU/RSV plus assay is intended as an aid in the diagnosis of influenza from  Nasopharyngeal swab specimens and should not be used as a sole basis for treatment. Nasal washings and aspirates are unacceptable for Xpert Xpress SARS-CoV-2/FLU/RSV testing.  Fact Sheet for Patients: EntrepreneurPulse.com.au  Fact Sheet for Healthcare Providers: IncredibleEmployment.be  This test is not yet approved or cleared by the [place] FDA and has been authorized for detection and/or diagnosis of SARS-CoV-2 by FDA under an Emergency Use Authorization (EUA). This EUA will remain in effect (meaning this test can be used) for the duration of the COVID-19 declaration under Section 564(b)(1) of the Act, 21 U.S.C. section 360bbb-3(b)(1), unless the authorization is terminated or revoked.  Performed at [facility], [place] [address], [place], [place] [address]      Time coordinating discharge: 35 minutes  SIGNED:   Kristopher Oppenheim, DO  [facility] 02/11/2021, 8:16 AM  If 7PM-7AM, please contact night-coverage [email or web address]

## 2021-02-11 NOTE — Plan of Care (Signed)
  Problem: Education: Goal: Knowledge of General Education information will improve Description: Including pain rating scale, medication(s)/side effects and non-pharmacologic comfort measures Outcome: Adequate for Discharge   Problem: Health Behavior/Discharge Planning: Goal: Ability to manage health-related needs will improve Outcome: Adequate for Discharge   Problem: Clinical Measurements: Goal: Ability to maintain clinical measurements within normal limits will improve Outcome: Adequate for Discharge Goal: Will remain free from infection Outcome: Adequate for Discharge Goal: Diagnostic test results will improve Outcome: Adequate for Discharge Goal: Respiratory complications will improve Outcome: Adequate for Discharge Goal: Cardiovascular complication will be avoided Outcome: Adequate for Discharge   Problem: Activity: Goal: Risk for activity intolerance will decrease Outcome: Adequate for Discharge   Problem: Nutrition: Goal: Adequate nutrition will be maintained Outcome: Adequate for Discharge   Problem: Coping: Goal: Level of anxiety will decrease Outcome: Adequate for Discharge   Problem: Elimination: Goal: Will not experience complications related to bowel motility Outcome: Adequate for Discharge Goal: Will not experience complications related to urinary retention Outcome: Adequate for Discharge   Problem: Pain Managment: Goal: General experience of comfort will improve Outcome: Adequate for Discharge   Problem: Safety: Goal: Ability to remain free from injury will improve Outcome: Adequate for Discharge   Problem: Skin Integrity: Goal: Risk for impaired skin integrity will decrease Outcome: Adequate for Discharge   Problem: Acute Rehab PT Goals(only PT should resolve) Goal: Pt Will Go Supine/Side To Sit Outcome: Adequate for Discharge Goal: Pt Will Go Sit To Supine/Side Outcome: Adequate for Discharge Goal: Patient Will Transfer Sit To/From  Stand Outcome: Adequate for Discharge Goal: Pt Will Ambulate Outcome: Adequate for Discharge   Problem: Acute Rehab OT Goals (only OT should resolve) Goal: Pt. Will Perform Grooming Outcome: Adequate for Discharge Goal: Pt. Will Perform Lower Body Bathing Outcome: Adequate for Discharge Goal: Pt. Will Transfer To Toilet Outcome: Adequate for Discharge Goal: Pt/Caregiver Will Perform Home Exercise Program Outcome: Adequate for Discharge

## 2021-02-11 NOTE — TOC Transition Note (Signed)
Transition of Care [facility]) - CM/SW Discharge Note   Patient Details  Name: Anthony Weber MRN: [id number] Date of Birth: 11-Jun-1945  Transition of Care [facility]) CM/SW Contact:  [place], [place] Phone Number: 02/11/2021, 10:06 AM   Clinical Narrative:    Patient will DC to: [facility] Anticipated DC date: 02/11/21 Family notified: Yes Transport by: Ocie Cornfield   Per MD patient ready for DC to . RN to call report prior to discharge [phone] Room# [phone] ask for the 800 hall nurse). RN, patient, patient's family, and facility notified of DC. Discharge Summary and FL2 sent to facility. DC packet on chart. Ambulance transport requested for patient eta roughly 1pm.   CSW will sign off for now as social work intervention is no longer needed. Please consult [place] again if new needs arise.     Final next level of care: Skilled Nursing Facility Barriers to Discharge: No Barriers Identified   Patient Goals and CMS Choice Patient states their goals for this hospitalization and ongoing recovery are:: to get better for return home CMS Medicare.gov Compare Post Acute Care list provided to:: Patient Choice offered to / list presented to : Patient, Spouse  Discharge Placement PASRR number recieved: 02/11/21            Patient chooses bed at: [facility] [place] Patient to be transferred to facility by: [facility] Name of family member notified: Army Melia and Webb Laws Patient and family notified of of transfer: 02/11/21  Discharge Plan and Services In-house Referral: Clinical Social Work Discharge Planning Services: CM Consult              DME Agency: [facility] Date DME Agency Contacted: 02/09/21 Time DME Agency Contacted: 1950 Representative spoke with at DME Agency: [place] Interventions     Readmission Risk Interventions No flowsheet data found.   Lakin Romer, MSW, [place] Heart  Failure Social Worker

## 2021-02-11 NOTE — Progress Notes (Signed)
Report has been called and completed.    Chrisandra Carota, RN 02/11/2021 10:55 AM

## 2021-02-17 ENCOUNTER — Ambulatory Visit (HOSPITAL_COMMUNITY)
Admit: 2021-02-17 | Discharge: 2021-02-17 | Disposition: A | Discharge status: Home / Self Care | Payer: Medicare HMO | Source: Ambulatory Visit | Attending: Family Medicine | Admitting: Family Medicine

## 2021-02-17 ENCOUNTER — Other Ambulatory Visit: Payer: Self-pay

## 2021-02-17 ENCOUNTER — Encounter (HOSPITAL_COMMUNITY): Payer: Self-pay

## 2021-02-17 VITALS — BP 89/56 | HR 70 | Wt 290.0 lb

## 2021-02-17 DIAGNOSIS — R7881 Bacteremia: Secondary | ICD-10-CM | POA: Diagnosis not present

## 2021-02-17 DIAGNOSIS — R0689 Other abnormalities of breathing: Secondary | ICD-10-CM | POA: Diagnosis not present

## 2021-02-17 DIAGNOSIS — Z09 Encounter for follow-up examination after completed treatment for conditions other than malignant neoplasm: Secondary | ICD-10-CM | POA: Insufficient documentation

## 2021-02-17 DIAGNOSIS — R0902 Hypoxemia: Secondary | ICD-10-CM | POA: Insufficient documentation

## 2021-02-17 DIAGNOSIS — I110 Hypertensive heart disease with heart failure: Secondary | ICD-10-CM | POA: Insufficient documentation

## 2021-02-17 DIAGNOSIS — Z87898 Personal history of other specified conditions: Secondary | ICD-10-CM | POA: Diagnosis not present

## 2021-02-17 DIAGNOSIS — I493 Ventricular premature depolarization: Secondary | ICD-10-CM | POA: Diagnosis not present

## 2021-02-17 DIAGNOSIS — I472 Ventricular tachycardia, unspecified: Secondary | ICD-10-CM | POA: Diagnosis not present

## 2021-02-17 DIAGNOSIS — R0683 Snoring: Secondary | ICD-10-CM

## 2021-02-17 DIAGNOSIS — J9611 Chronic respiratory failure with hypoxia: Secondary | ICD-10-CM

## 2021-02-17 DIAGNOSIS — Z87891 Personal history of nicotine dependence: Secondary | ICD-10-CM | POA: Diagnosis not present

## 2021-02-17 DIAGNOSIS — I5043 Acute on chronic combined systolic (congestive) and diastolic (congestive) heart failure: Secondary | ICD-10-CM | POA: Diagnosis present

## 2021-02-17 DIAGNOSIS — R5381 Other malaise: Secondary | ICD-10-CM

## 2021-02-17 DIAGNOSIS — J9612 Chronic respiratory failure with hypercapnia: Secondary | ICD-10-CM

## 2021-02-17 DIAGNOSIS — E785 Hyperlipidemia, unspecified: Secondary | ICD-10-CM | POA: Insufficient documentation

## 2021-02-17 DIAGNOSIS — B9561 Methicillin susceptible Staphylococcus aureus infection as the cause of diseases classified elsewhere: Secondary | ICD-10-CM | POA: Diagnosis not present

## 2021-02-17 DIAGNOSIS — Z6841 Body Mass Index (BMI) 40.0 and over, adult: Secondary | ICD-10-CM | POA: Insufficient documentation

## 2021-02-17 LAB — BASIC METABOLIC PANEL
Anion gap: 12 (ref 5–15)
BUN: 27 mg/dL — ABNORMAL HIGH (ref 8–23)
CO2: 26 mmol/L (ref 22–32)
Calcium: 8.5 mg/dL — ABNORMAL LOW (ref 8.9–10.3)
Chloride: 93 mmol/L — ABNORMAL LOW (ref 98–111)
Creatinine, Ser: 1.18 mg/dL (ref 0.61–1.24)
GFR, Estimated: >60 mL/min (ref >60)
Glucose, Bld: 104 mg/dL — ABNORMAL HIGH (ref 70–99)
Potassium: 4.3 mmol/L (ref 3.5–5.1)
Sodium: 131 mmol/L — ABNORMAL LOW (ref 135–145)

## 2021-02-17 LAB — BRAIN NATRIURETIC PEPTIDE: B Natriuretic Peptide: 354.5 pg/mL — ABNORMAL HIGH (ref 0.0–100.0)

## 2021-02-17 NOTE — Progress Notes (Signed)
ADVANCED HF CLINIC CONSULT NOTE  Primary Care: Anthony Billet, MD Primary HF Cardiologist: Dr. Haroldine Weber  HPI: [name] is a 75 y.o. with history includes diastolic HF, HTN, HLD and obesity. Had Echo 04/18/20 w/ normal LVEF 60-65% w/ G2DD and normal RV.   Admitted to [facility] 01/18/21 w/ A/C CHF. Echo w/ reduced EF 25-30% from previous EF 60-65%.R/LHC w/ no CAD, moderately elevated left heart and pulmonary artery pressures, severely elevated right heart filling pressures, severely reduced Fick cardiac output/index. CI 1.5. Started on lasix drip. Placed on Milrinone + NE. Amio gtt started for frequent ectopy w/ PVCs and NSVT. Despite pressors had persistent shock. Transferred to [facility] for cardiogenic/septic shock, + MSSA bacteremia. Underwent TEE showing EF 25-30%, RV moderately down, severe biatrial enlargement and moderate MR, no valvular vegetation. Required addition of mexitiline to suppress PVCs. Hospitalization c/b AKI and hypoxic/hypercarbic respiratory failure. Milrinone, NE, and amio gtts weaned off. GDMT limited by low BP, requiring midodrine. He was discharged with midline for IV abx x 4 weeks to SNF on torsemide; weight 291 lbs.    Today he returns for post hospitalization HF follow up with his wife. He is at [facility] for rehab for another couple of weeks. Doing PT without much SOB or fatigue. Feels feet may be swelling some. No issues with PICC line. Overall feeling fine. Denies CP, palpitations, dizziness, or PND/Orthopnea. Appetite ok. No fever or chills. Weight at facility 290 lbs yesterday. Taking all medications given by facility. His wife bought a CPAP and he is wearing it nightly.  Review of Systems: [y] = yes, [ ]  = no   General: Weight gain [ ] ; Weight loss [ ] ; Anorexia [ ] ; Fatigue [ ] ; Fever [ ] ; Chills [ ] ; Weakness [ ]   Cardiac: Chest pain/pressure [ ] ; Resting SOB [ ] ; Exertional SOB [ ] ; Orthopnea [ ] ; Pedal Edema [ ] ; Palpitations [ ] ; Syncope [ ] ; Presyncope [ ] ; Paroxysmal  nocturnal dyspnea[ ]   Pulmonary: Cough [ ] ; Wheezing[ ] ; Hemoptysis[ ] ; Sputum [ ] ; Snoring Anthony.Weber ]  GI: Vomiting[ ] ; Dysphagia[ ] ; Melena[ ] ; Hematochezia [ ] ; Heartburn[ ] ; Abdominal pain [ ] ; Constipation [ ] ; Diarrhea [ ] ; BRBPR [ ]   GU: Hematuria[ ] ; Dysuria [ ] ; Nocturia[ ]   Vascular: Pain in legs with walking [ ] ; Pain in feet with lying flat [ ] ; Non-healing sores [ ] ; Stroke [ ] ; TIA [ ] ; Slurred speech [ ] ;  Neuro: Headaches[ ] ; Vertigo[ ] ; Seizures[ ] ; Paresthesias[ ] ;Blurred vision [ ] ; Diplopia [ ] ; Vision changes [ ]   Ortho/Skin: Arthritis [ ] ; Joint pain [ ] ; Muscle pain [ ] ; Joint swelling [ ] ; Back Pain [ ] ; Rash [ ]   Psych: Depression[ ] ; Anxiety[ ]   Heme: Bleeding problems [ ] ; Clotting disorders [ ] ; Anemia [ ]   Endocrine: Diabetes [ ] ; Thyroid dysfunction[ ]   Past Medical History:  Diagnosis Date   Depression    Hypertension    Hypothyroidism    Current Outpatient Medications  Medication Sig Dispense Refill   amiodarone (PACERONE) 200 MG tablet Take 1 tablet (200 mg total) by mouth daily. 30 tablet 0   Ascorbic Acid (VITAMIN C) 1000 MG tablet Take 1,000 mg by mouth daily.     aspirin 325 MG tablet Take 325 mg by mouth daily.     ceFAZolin (ANCEF) IVPB Inject 2 g into the vein every 8 (eight) hours for 26 days. Indication:  MSSA Bacteremia First Dose: Yes Last Day of Therapy:  03/09/21 Labs - Once weekly:  CBC/D and BMP, Labs - Every other week:  ESR and CRP Method of administration: IV Push Method of administration may be changed at the discretion of home infusion pharmacist based upon assessment of the patient and/or caregiver's ability to self-administer the medication ordered. 24 Units 0   Cholecalciferol (VITAMIN D3) 125 MCG (5000 UT) CAPS Take 1 capsule by mouth daily.     folic acid (FOLVITE) 503 MCG tablet Take 400 mcg by mouth daily.     levothyroxine (SYNTHROID) 50 MCG tablet Take 50 mcg by mouth daily before breakfast.     mexiletine (MEXITIL) 150 MG  capsule Take 1 capsule (150 mg total) by mouth every 12 (twelve) hours. 60 capsule 0   midodrine (PROAMATINE) 10 MG tablet Take 1 tablet (10 mg total) by mouth 3 (three) times daily with meals. 90 tablet 0   pravastatin (PRAVACHOL) 10 MG tablet Take 10 mg by mouth daily.     torsemide 40 MG TABS Take 40 mg by mouth daily. 30 tablet 0   vitamin B-12 (CYANOCOBALAMIN) 100 MCG tablet Take 100 mcg by mouth daily.     No current facility-administered medications for this encounter.   No Known Allergies  Social History   Socioeconomic History   Marital status: Married    Spouse name: Anthony Weber   Number of children: Not on file   Years of education: Not on file   Highest education level: Not on file  Occupational History   Not on file  Tobacco Use   Smoking status: Former   Smokeless tobacco: Former    Types: Nurse, children's Use: Unknown  Substance and Sexual Activity   Alcohol use: No   Drug use: Not on file   Sexual activity: Not on file  Other Topics Concern   Not on file  Social History Narrative   Not on file   Social Determinants of Health   Financial Resource Strain: Not on file  Food Insecurity: Not on file  Transportation Needs: Not on file  Physical Activity: Not on file  Stress: Not on file  Social Connections: Not on file  Intimate Partner Violence: Not on file   No family history on file.  BP (!) 89/56   Pulse 70   Wt 131.5 kg (290 lb)   SpO2 97%   BMI 41.61 kg/m   Wt Readings from Last 3 Encounters:  02/17/21 131.5 kg (290 lb)  02/11/21 132.3 kg (291 lb 10.7 oz)  01/29/21 (!) 146.4 kg (322 lb 12.1 oz)   PHYSICAL EXAM: General:  NAD. No resp difficulty, arrived in [facility] HEENT: Normal Neck: Supple. Thick neck. Carotids 2+ bilat; no bruits. No lymphadenopathy or thryomegaly appreciated. Cor: PMI nondisplaced. Regular rate & rhythm. No rubs, gallops or murmurs. Lungs: Clear Abdomen: Obese,  nontender, nondistended. No hepatosplenomegaly.  No bruits or masses. Good bowel sounds. Extremities: No cyanosis, clubbing, rash, 1 + BLE, RUE PICC, looks ok today. Neuro: Alert & oriented x 3, cranial nerves grossly intact. Moves all 4 extremities w/o difficulty. Affect pleasant.  ECG: SR, no ectopy 70 bpm (personally reviewed).  ASSESSMENT & PLAN:  1. Chronic Systolic Heart Failure  - Recent admit with cardiogenic/septic shock - Echo (10/22): EF down from 55-->20-25%.  - R/LHC (10/22): with no CAD, elevated filling pressures and low output heart failure.  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  - NYHA II-early  III, functional class difficult due to body habitus and general physical deconditioning. Volume status stable.  - GDMT limited by hypotension. - Place UNNA boots, if these cannot be done at facility will need compression hose. Swelling looks like venous stasis. - Continue midodrine 10 mg tid. - Continue torsemide 40 mg daily. - Needs close monitoring of weights at facility, three times a week. - Will need repeat echo in a few weeks now that PVCs suppressed. - Labs today.   2. Chronic Hypoxic/Hypercarbia Respiratory Failure  - Sats stable room air. - Continue CPAP.   3. NSVT, PVC  - ECG with NSR today, no PVCs. - Continue mexilitene 150 mg bid. - Stop amiodarone. - Arrange for formal sleep study when he gets home from facility.   4. MSSA bacteremia - Likely respiratory source - TEE (10/22) - no vegetation  - Cefazolin via PICC until 03/09/21.   5. Morbid Obesity  - Body mass index is 41.61 kg/m. - Will need weight loss.  6. Suspected Sleep Apnea - Sleep study as above. - Wife bought CPAP, but she does not know settings.  7. Physical Deconditioning - Continue PT at SNF  Follow up with APP in 6 weeks and 12 weeks with Dr. Haroldine Weber.   Allena Katz, FNP-BC 02/17/21

## 2021-02-17 NOTE — Patient Instructions (Signed)
EKG was done today  Labs were done today, if any labs are abnormal the clinic will call you  STOP Amiodarone  PLEASE place bilateral UNNA boots, If not able to do so please place compression hose  CHECK weight 3 times a week  Your physician recommends that you schedule a follow-up appointment in: 6 weeks in clinic and 12 week with echocardiogram with Dr. Haroldine Laws  At the [facility], you and your health needs are our priority. As part of our continuing mission to provide you with exceptional heart care, we have created designated Provider Care Teams. These Care Teams include your primary Cardiologist (physician) and Advanced Practice Providers (APPs- Physician Assistants and Nurse Practitioners) who all work together to provide you with the care you need, when you need it.   You may see any of the following providers on your designated Care Team at your next follow up: Dr Glori Bickers Dr Haynes Kerns, NP Lyda Jester, [place] [facility] [place], [place] Audry Riles, PharmD   Please be sure to bring in all your medications bottles to every appointment.   If you have any questions or concerns before your next appointment please send [place] a message through [place] or call our office at [phone].    TO LEAVE A MESSAGE FOR THE NURSE SELECT OPTION 2, PLEASE LEAVE A MESSAGE INCLUDING: YOUR NAME DATE OF BIRTH CALL BACK NUMBER REASON FOR CALL**this is important as we prioritize the call backs  YOU WILL RECEIVE A CALL BACK THE SAME DAY AS LONG AS YOU CALL BEFORE 4:00 PM

## 2021-03-25 ENCOUNTER — Ambulatory Visit: Payer: Medicare HMO | Admitting: Infectious Disease

## 2021-03-25 ENCOUNTER — Other Ambulatory Visit: Payer: Self-pay

## 2021-03-25 VITALS — BP 124/82 | HR 78 | Resp 16 | Ht 70.0 in | Wt 277.1 lb

## 2021-03-25 DIAGNOSIS — I10 Essential (primary) hypertension: Secondary | ICD-10-CM

## 2021-03-25 DIAGNOSIS — I5021 Acute systolic (congestive) heart failure: Secondary | ICD-10-CM

## 2021-03-25 DIAGNOSIS — R570 Cardiogenic shock: Secondary | ICD-10-CM

## 2021-03-25 DIAGNOSIS — R7881 Bacteremia: Secondary | ICD-10-CM

## 2021-03-25 DIAGNOSIS — T80219D Unspecified infection due to central venous catheter, subsequent encounter: Secondary | ICD-10-CM

## 2021-03-25 DIAGNOSIS — I110 Hypertensive heart disease with heart failure: Secondary | ICD-10-CM

## 2021-03-25 DIAGNOSIS — B9561 Methicillin susceptible Staphylococcus aureus infection as the cause of diseases classified elsewhere: Secondary | ICD-10-CM

## 2021-03-25 DIAGNOSIS — J15211 Pneumonia due to Methicillin susceptible Staphylococcus aureus: Secondary | ICD-10-CM

## 2021-03-25 DIAGNOSIS — Z7185 Encounter for immunization safety counseling: Secondary | ICD-10-CM

## 2021-03-25 NOTE — Progress Notes (Signed)
Subjective:  Complaint follow-up for MSSA bacteremia   Patient ID: Anthony Weber, male    DOB: [birth year], 75 y.o.   MRN: [id number]  HPI  Anthony Weber is a 75 year old man with advanced heart failure who presented [facility] on October 9 with lower extremity and dyspnea on exertion.  He was admitted for acute heart failure exacerbation and manage IV diuretics he underwent right and left heart catheterization on October 18 that showed no significant Neri disease he did have moderately elevated left heart and PA pressures and severely elevated right heart sided heart pressures.  He was transferred the ICU October 18 and started on milrinone infusion for cardiogenic shock October 20 he was started on Lasix for hypoxia in setting of cardiogenic shock and transferred to [facility] for higher level care.  He had clinical worsening and blood cultures were taken and he was started on vancomycin and cefepime both cultures turn positive for methicillin sensitive Staph aureus.  His central line was felt to be the source of infection and was removed ultimately.  He underwent transesophageal echocardiogram that failed to show any evidence of endocarditis.  Is continued on cefazolin to which he was narrowed and completed therapy on November 28.  PICC line was removed.  He is here for visit in clinic and is doing clinically well.  He has no evidence of localized or systemic infection.  He did have some congestion recently that was managed by over-the-counter medications.     Past Medical History:  Diagnosis Date   Depression    Hypertension    Hypothyroidism     Past Surgical History:  Procedure Laterality Date   COLONOSCOPY WITH PROPOFOL N/A 03/03/2015   Procedure: COLONOSCOPY WITH PROPOFOL;  Surgeon: Manya Silvas, MD;  Location: [facility];  Service: Endoscopy;  Laterality: N/A;   EYE SURGERY     RIGHT/LEFT HEART CATH AND CORONARY ANGIOGRAPHY N/A 01/27/2021   Procedure:  RIGHT/LEFT HEART CATH AND CORONARY ANGIOGRAPHY;  Surgeon: Nelva Bush, MD;  Location: [facility];  Service: Cardiovascular;  Laterality: N/A;   TEE WITHOUT CARDIOVERSION N/A 02/05/2021   Procedure: TRANSESOPHAGEAL ECHOCARDIOGRAM (TEE);  Surgeon: Jolaine Artist, MD;  Location: [facility] ENDOSCOPY;  Service: Cardiovascular;  Laterality: N/A;    No family history on file.    Social History   Socioeconomic History   Marital status: Married    Spouse name: Guage Efferson   Number of children: Not on file   Years of education: Not on file   Highest education level: Not on file  Occupational History   Not on file  Tobacco Use   Smoking status: Former   Smokeless tobacco: Former    Types: Nurse, children's Use: Unknown  Substance and Sexual Activity   Alcohol use: No   Drug use: Not on file   Sexual activity: Not on file  Other Topics Concern   Not on file  Social History Narrative   Not on file   Social Determinants of Health   Financial Resource Strain: Not on file  Food Insecurity: Not on file  Transportation Needs: Not on file  Physical Activity: Not on file  Stress: Not on file  Social Connections: Not on file    No Known Allergies   Current Outpatient Medications:    Ascorbic Acid (VITAMIN C) 1000 MG tablet, Take 1,000 mg by mouth daily., Disp: , Rfl:    aspirin 325 MG tablet, Take 325 mg by  mouth daily., Disp: , Rfl:    Cholecalciferol (VITAMIN D3) 125 MCG (5000 UT) CAPS, Take 1 capsule by mouth daily., Disp: , Rfl:    folic acid (FOLVITE) 355 MCG tablet, Take 400 mcg by mouth daily., Disp: , Rfl:    levothyroxine (SYNTHROID) 50 MCG tablet, Take 50 mcg by mouth daily before breakfast., Disp: , Rfl:    mexiletine (MEXITIL) 150 MG capsule, Take 1 capsule (150 mg total) by mouth every 12 (twelve) hours., Disp: 60 capsule, Rfl: 0   pravastatin (PRAVACHOL) 10 MG tablet, Take 10 mg by mouth daily., Disp: , Rfl:    torsemide 40 MG TABS, Take 40 mg by  mouth daily., Disp: 30 tablet, Rfl: 0   vitamin B-12 (CYANOCOBALAMIN) 100 MCG tablet, Take 100 mcg by mouth daily., Disp: , Rfl:    Review of Systems  Constitutional:  Negative for activity change, appetite change, chills, diaphoresis, fatigue, fever and unexpected weight change.  HENT:  Negative for congestion, rhinorrhea, sinus pressure, sneezing, sore throat and trouble swallowing.   Eyes:  Negative for photophobia and visual disturbance.  Respiratory:  Negative for cough, chest tightness, shortness of breath, wheezing and stridor.   Cardiovascular:  Negative for chest pain, palpitations and leg swelling.  Gastrointestinal:  Negative for abdominal distention, abdominal pain, anal bleeding, blood in stool, constipation, diarrhea, nausea and vomiting.  Genitourinary:  Negative for difficulty urinating, dysuria, flank pain and hematuria.  Musculoskeletal:  Negative for arthralgias, back pain, gait problem, joint swelling and myalgias.  Skin:  Negative for color change, pallor, rash and wound.  Neurological:  Negative for dizziness, tremors, weakness and light-headedness.  Hematological:  Negative for adenopathy. Does not bruise/bleed easily.  Psychiatric/Behavioral:  Negative for agitation, behavioral problems, confusion, decreased concentration, dysphoric mood and sleep disturbance.       Objective:   Physical Exam Constitutional:      Appearance: He is well-developed. He is obese.  HENT:     Head: Normocephalic and atraumatic.  Eyes:     Conjunctiva/sclera: Conjunctivae normal.  Cardiovascular:     Rate and Rhythm: Normal rate and regular rhythm.  Pulmonary:     Effort: Pulmonary effort is normal. No respiratory distress.     Breath sounds: No wheezing.  Abdominal:     General: There is no distension.     Palpations: Abdomen is soft.  Musculoskeletal:        General: No tenderness. Normal range of motion.     Cervical back: Normal range of motion and neck supple.  Skin:     General: Skin is warm and dry.     Coloration: Skin is not pale.     Findings: No erythema or rash.  Neurological:     General: No focal deficit present.     Mental Status: He is alert and oriented to person, place, and time.  Psychiatric:        Mood and Affect: Mood normal.        Behavior: Behavior normal.        Thought Content: Thought content normal.        Judgment: Judgment normal.          Assessment & Plan:   MSSA bacteremia and sepsis due to central line infection:  He is received adequate therapy.  I am checking blood cultures from 2 sites.  Advanced heart failure clysed to be following up with Dr. Haroldine Laws soon  Vaccine counseling: Mended that he receive COVID-19 updated booster which she received today  in clinic.  I spent 35  minutes with the patient including than 50% of the time in face to face counseling of the patient nature of MSSA bacteremia, counseling regarding vaccines and how they work, p along with review of medical records in preparation for the visit and during the visit and in coordination of his care.

## 2021-03-30 NOTE — Progress Notes (Signed)
ADVANCED HF CLINIC CONSULT NOTE  Primary Care: Albina Billet, MD Primary HF Cardiologist: Dr. Haroldine Laws  HPI: [name] is a 75 y.o. with history includes diastolic HF, HTN, HLD and obesity. Had Echo 04/18/20 w/ normal LVEF 60-65% w/ G2DD and normal RV.   Admitted to [facility] 01/18/21 w/ A/C CHF. Echo w/ reduced EF 25-30% from previous EF 60-65%.R/LHC w/ no CAD, moderately elevated left heart and pulmonary artery pressures, severely elevated right heart filling pressures, severely reduced Fick cardiac output/index. CI 1.5. Started on lasix drip. Placed on Milrinone + NE. Amio gtt started for frequent ectopy w/ PVCs and NSVT. Despite pressors had persistent shock. Transferred to [facility] for cardiogenic/septic shock, + MSSA bacteremia. Underwent TEE showing EF 25-30%, RV moderately down, severe biatrial enlargement and moderate MR, no valvular vegetation. Required addition of mexitiline to suppress PVCs. Hospitalization c/b AKI and hypoxic/hypercarbic respiratory failure. Milrinone, NE, and amio gtts weaned off. GDMT limited by low BP, requiring midodrine. He was discharged with midline for IV abx x 4 weeks to SNF on torsemide; weight 291 lbs.   Today he returns for HF follow up with his wife. Overall feeling fine. Now back home and working with PT twice a week, does exercises on his off days. No SOB with ADLs or getting around the house. Denies palpitations, abnormal bleeding, CP, dizziness, edema, or PND/Orthopnea. Appetite ok. No fever or chills. Weight at home 273 pounds. Taking all medications. Does not have a CPAP at home. Wife is asking if he can drive.  Past Medical History:  Diagnosis Date   Depression    Hypertension    Hypothyroidism    Current Outpatient Medications  Medication Sig Dispense Refill   Ascorbic Acid (VITAMIN C) 1000 MG tablet Take 1,000 mg by mouth daily.     aspirin 325 MG tablet Take 325 mg by mouth daily.     Cholecalciferol (VITAMIN D3) 125 MCG (5000 UT) CAPS Take 1 capsule  by mouth daily.     folic acid (FOLVITE) 962 MCG tablet Take 400 mcg by mouth daily.     levothyroxine (SYNTHROID) 50 MCG tablet Take 50 mcg by mouth daily before breakfast.     midodrine (PROAMATINE) 10 MG tablet Take 10 mg by mouth 3 (three) times daily.     pravastatin (PRAVACHOL) 10 MG tablet Take 10 mg by mouth daily.     torsemide 40 MG TABS Take 40 mg by mouth daily. 30 tablet 0   vitamin B-12 (CYANOCOBALAMIN) 100 MCG tablet Take 100 mcg by mouth daily.     mexiletine (MEXITIL) 150 MG capsule Take 1 capsule (150 mg total) by mouth every 12 (twelve) hours. 60 capsule 0   No current facility-administered medications for this encounter.   No Known Allergies  Social History   Socioeconomic History   Marital status: Married    Spouse name: Kristofor Michalowski   Number of children: Not on file   Years of education: Not on file   Highest education level: Not on file  Occupational History   Not on file  Tobacco Use   Smoking status: Former   Smokeless tobacco: Former    Types: Nurse, children's Use: Unknown  Substance and Sexual Activity   Alcohol use: No   Drug use: Not on file   Sexual activity: Not on file  Other Topics Concern   Not on file  Social History Narrative   Not on file   Social Determinants of Health  Financial Resource Strain: Not on file  Food Insecurity: Not on file  Transportation Needs: Not on file  Physical Activity: Not on file  Stress: Not on file  Social Connections: Not on file  Intimate Partner Violence: Not on file   No family history on file.  BP 120/68    Pulse 73    Wt 125.6 kg (276 lb 12.8 oz)    SpO2 97%    BMI 39.72 kg/m   Wt Readings from Last 3 Encounters:  03/31/21 125.6 kg (276 lb 12.8 oz)  03/25/21 125.7 kg (277 lb 1 oz)  02/17/21 131.5 kg (290 lb)   PHYSICAL EXAM: General:  NAD. No resp difficulty, walked into clinic with rolling walker HEENT: Normal Neck: Supple. Thick neck. Carotids 2+ bilat; no bruits. No  lymphadenopathy or thryomegaly appreciated. Cor: PMI nondisplaced. Regular rate & rhythm. No rubs, gallops or murmurs. Lungs: Clear Abdomen: Obese, nontender, nondistended. No hepatosplenomegaly. No bruits or masses. Good bowel sounds. Extremities: No cyanosis, clubbing, rash, edema; compression hose on Neuro: Alert & oriented x 3, cranial nerves grossly intact. Moves all 4 extremities w/o difficulty. Affect pleasant.  ECG: SR, no ectopy 76 bpm,  (personally reviewed).  ASSESSMENT & PLAN:  1. Chronic Systolic Heart Failure  - Recent admit with cardiogenic/septic shock - Echo (10/22): EF down from 55-->20-25%.  - R/LHC (10/22): with no CAD, elevated filling pressures and low output heart failure.  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  - NYHA II-early III, functional class difficult due to body habitus and general physical deconditioning. Volume status stable.  - GDMT limited by hypotension. - Continue compression hose. - Decrease midodrine to 5 mg tid. Plan to wean and stop. - Continue torsemide 40 mg daily. - Continue daily weights. - Will need repeat echo in a few weeks now that PVCs suppressed. - Labs today.   2. NSVT, PVC  - ECG with NSR today, no PVCs. - Continue mexilitene 150 mg bid. - Arrange for formal sleep study.   3. MSSA bacteremia - Likely respiratory source - TEE (10/22) - no vegetation  - Completed Cefazolin, PICC removed. - Followed by ID.  4. Chronic Hypoxic/Hypercarbia Respiratory Failure  - Sats stable room air.  5. Morbid Obesity  - Body mass index is 39.72 kg/m. - Will need weight loss.  6. Suspected Sleep Apnea - Sleep study as above.  7. Physical Deconditioning - Continue HH PT   Wife asks if he can drive. He was never explicitly told he could not drive. Discussed he does not have cardiac restrictions for driving, but would avoid driving long distances/long periods of time while  undergoing HF medication changes. He and wife verbalized understanding.  Follow up with Dr. Haroldine Laws in 2 months with echo as scheduled.   Allena Katz, FNP-BC 03/31/21

## 2021-03-31 ENCOUNTER — Ambulatory Visit (HOSPITAL_COMMUNITY)
Admission: RE | Admit: 2021-03-31 | Discharge: 2021-03-31 | Disposition: A | Discharge status: Home / Self Care | Payer: Medicare HMO | Source: Ambulatory Visit | Attending: Family Medicine | Admitting: Family Medicine

## 2021-03-31 ENCOUNTER — Encounter (HOSPITAL_COMMUNITY): Payer: Self-pay

## 2021-03-31 ENCOUNTER — Other Ambulatory Visit: Payer: Self-pay

## 2021-03-31 VITALS — BP 120/68 | HR 73 | Wt 276.8 lb

## 2021-03-31 DIAGNOSIS — J9611 Chronic respiratory failure with hypoxia: Secondary | ICD-10-CM

## 2021-03-31 DIAGNOSIS — R0683 Snoring: Secondary | ICD-10-CM

## 2021-03-31 DIAGNOSIS — I447 Left bundle-branch block, unspecified: Secondary | ICD-10-CM | POA: Diagnosis not present

## 2021-03-31 DIAGNOSIS — Z79899 Other long term (current) drug therapy: Secondary | ICD-10-CM | POA: Insufficient documentation

## 2021-03-31 DIAGNOSIS — I110 Hypertensive heart disease with heart failure: Secondary | ICD-10-CM | POA: Insufficient documentation

## 2021-03-31 DIAGNOSIS — I472 Ventricular tachycardia, unspecified: Secondary | ICD-10-CM | POA: Diagnosis not present

## 2021-03-31 DIAGNOSIS — E785 Hyperlipidemia, unspecified: Secondary | ICD-10-CM | POA: Diagnosis not present

## 2021-03-31 DIAGNOSIS — Z87891 Personal history of nicotine dependence: Secondary | ICD-10-CM | POA: Diagnosis not present

## 2021-03-31 DIAGNOSIS — Z87898 Personal history of other specified conditions: Secondary | ICD-10-CM

## 2021-03-31 DIAGNOSIS — I509 Heart failure, unspecified: Secondary | ICD-10-CM | POA: Diagnosis not present

## 2021-03-31 DIAGNOSIS — B9561 Methicillin susceptible Staphylococcus aureus infection as the cause of diseases classified elsewhere: Secondary | ICD-10-CM | POA: Diagnosis not present

## 2021-03-31 DIAGNOSIS — Z6839 Body mass index (BMI) 39.0-39.9, adult: Secondary | ICD-10-CM | POA: Insufficient documentation

## 2021-03-31 DIAGNOSIS — Z09 Encounter for follow-up examination after completed treatment for conditions other than malignant neoplasm: Secondary | ICD-10-CM | POA: Diagnosis not present

## 2021-03-31 DIAGNOSIS — I5042 Chronic combined systolic (congestive) and diastolic (congestive) heart failure: Secondary | ICD-10-CM | POA: Insufficient documentation

## 2021-03-31 DIAGNOSIS — R6521 Severe sepsis with septic shock: Secondary | ICD-10-CM | POA: Diagnosis not present

## 2021-03-31 DIAGNOSIS — J9692 Respiratory failure, unspecified with hypercapnia: Secondary | ICD-10-CM | POA: Insufficient documentation

## 2021-03-31 DIAGNOSIS — R5381 Other malaise: Secondary | ICD-10-CM

## 2021-03-31 DIAGNOSIS — R7881 Bacteremia: Secondary | ICD-10-CM | POA: Insufficient documentation

## 2021-03-31 DIAGNOSIS — J9612 Chronic respiratory failure with hypercapnia: Secondary | ICD-10-CM

## 2021-03-31 DIAGNOSIS — G4730 Sleep apnea, unspecified: Secondary | ICD-10-CM | POA: Diagnosis not present

## 2021-03-31 DIAGNOSIS — I493 Ventricular premature depolarization: Secondary | ICD-10-CM

## 2021-03-31 DIAGNOSIS — I5043 Acute on chronic combined systolic (congestive) and diastolic (congestive) heart failure: Secondary | ICD-10-CM

## 2021-03-31 LAB — CULTURE, BLOOD (SINGLE)
MICRO NUMBER:: 12757355
MICRO NUMBER:: 12757356
Result:: NO GROWTH
Result:: NO GROWTH
SPECIMEN QUALITY:: ADEQUATE
SPECIMEN QUALITY:: ADEQUATE

## 2021-03-31 LAB — BASIC METABOLIC PANEL
Anion gap: 9 (ref 5–15)
BUN: 21 mg/dL (ref 8–23)
CO2: 27 mmol/L (ref 22–32)
Calcium: 9.2 mg/dL (ref 8.9–10.3)
Chloride: 97 mmol/L — ABNORMAL LOW (ref 98–111)
Creatinine, Ser: 1.09 mg/dL (ref 0.61–1.24)
GFR, Estimated: >60 mL/min (ref >60)
Glucose, Bld: 119 mg/dL — ABNORMAL HIGH (ref 70–99)
Potassium: 3.9 mmol/L (ref 3.5–5.1)
Sodium: 133 mmol/L — ABNORMAL LOW (ref 135–145)

## 2021-03-31 MED ORDER — MIDODRINE HCL 5 MG PO TABS: 5.0000 mg | ORAL_TABLET | Freq: Three times a day (TID) | ORAL | 3 refills | Status: DC

## 2021-03-31 MED ORDER — PRAVASTATIN SODIUM 10 MG PO TABS: 10.0000 mg | ORAL_TABLET | Freq: Every day | ORAL | 3 refills | Status: DC

## 2021-03-31 NOTE — Progress Notes (Signed)
Patient Name: Anthony Weber        DOB: 10-29-1945      Height: 5'10'    Weight:276.8lb  Office Name:[facility]          Referring Provider: Marcy Salvo, MD  Today's Date:03/31/2021  STOP BANG RISK ASSESSMENT S (snore) Have you been told that you snore?     NO   T (tired) Are you often tired, fatigued, or sleepy during the day?   NO  O (obstruction) Do you stop breathing, choke, or gasp during sleep? NO   P (pressure) Do you have or are you being treated for high blood pressure? YES   B (BMI) Is your body index greater than 35 kg/m? YES   A (age) Are you 58 years old or older? YES   N (neck) Do you have a neck circumference greater than 16 inches?   YES   G (gender) Are you a male? YES   TOTAL STOP/BANG YES ANSWERS                                                                        For Office Use Only              Procedure Order Form    YES to 3+ Stop Bang questions OR two clinical symptoms - patient qualifies for WatchPAT (CPT 03212)             Clinical Notes: Will consult Sleep Specialist and refer for management of therapy due to patient increased risk of Sleep Apnea. Ordering a sleep study due to the following two clinical symptoms: History of high blood pressure R03.0 / Insomnia G47.00    I understand that I am proceeding with a home sleep apnea test as ordered by my treating physician. I understand that untreated sleep apnea is a serious cardiovascular risk factor and it is my responsibility to perform the test and seek management for sleep apnea. I will be contacted with the results and be managed for sleep apnea by a local sleep physician. I will be receiving equipment and further instructions from [facility]. I shall promptly ship back the equipment via the included mailing label. I understand my insurance will be billed for the test and as the patient I am responsible for any insurance related out-of-pocket costs incurred. I have  been provided with written instructions and can call for additional video or telephonic instruction, with 24-hour availability of qualified personnel to answer any questions: Patient Help Desk [phone].  Patient Signature ______________________________________________________   Date______________________ Patient Telemedicine Verbal Consent

## 2021-03-31 NOTE — Patient Instructions (Signed)
DECREASE Midodrine to 5 mg, one tab three times daily CONTINUE Pravastatin 10 mg ,one tab daily  Labs today We will only contact you if something comes back abnormal or we need to make some changes. Otherwise no news is good news!  Keep follow up as scheduled with Dr Haroldine Laws   Do the following things EVERYDAY: Weigh yourself in the morning before breakfast. Write it down and keep it in a log. Take your medicines as prescribed Eat low salt foods--Limit salt (sodium) to 2000 mg per day.  Stay as active as you can everyday Limit all fluids for the day to less than 2 liters  At the [facility], you and your health needs are our priority. As part of our continuing mission to provide you with exceptional heart care, we have created designated Provider Care Teams. These Care Teams include your primary Cardiologist (physician) and Advanced Practice Providers (APPs- Physician Assistants and Nurse Practitioners) who all work together to provide you with the care you need, when you need it.   You may see any of the following providers on your designated Care Team at your next follow up: Dr Glori Bickers Dr Haynes Kerns, NP Lyda Jester, [place] [facility] [place], [place] Audry Riles, PharmD   Please be sure to bring in all your medications bottles to every appointment.    If you have any questions or concerns before your next appointment please send [place] a message through [place] or call our office at [phone].    TO LEAVE A MESSAGE FOR THE NURSE SELECT OPTION 2, PLEASE LEAVE A MESSAGE INCLUDING: YOUR NAME DATE OF BIRTH CALL BACK NUMBER REASON FOR CALL**this is important as we prioritize the call backs  YOU WILL RECEIVE A CALL BACK THE SAME DAY AS LONG AS YOU CALL BEFORE 4:00 PM

## 2021-05-11 ENCOUNTER — Other Ambulatory Visit (HOSPITAL_COMMUNITY): Payer: Self-pay | Admitting: *Deleted

## 2021-05-11 DIAGNOSIS — I5043 Acute on chronic combined systolic (congestive) and diastolic (congestive) heart failure: Secondary | ICD-10-CM

## 2021-05-14 ENCOUNTER — Other Ambulatory Visit: Payer: Self-pay

## 2021-05-14 ENCOUNTER — Ambulatory Visit (HOSPITAL_BASED_OUTPATIENT_CLINIC_OR_DEPARTMENT_OTHER)
Admission: RE | Admit: 2021-05-14 | Discharge: 2021-05-14 | Disposition: A | Discharge status: Home / Self Care | Payer: Medicare HMO | Source: Ambulatory Visit | Attending: Internal Medicine | Admitting: Internal Medicine

## 2021-05-14 ENCOUNTER — Encounter (HOSPITAL_COMMUNITY): Payer: Self-pay | Admitting: Internal Medicine

## 2021-05-14 ENCOUNTER — Other Ambulatory Visit (HOSPITAL_COMMUNITY): Payer: Self-pay

## 2021-05-14 ENCOUNTER — Ambulatory Visit (HOSPITAL_COMMUNITY)
Admission: RE | Admit: 2021-05-14 | Discharge: 2021-05-14 | Disposition: A | Discharge status: Home / Self Care | Payer: Medicare HMO | Source: Ambulatory Visit | Attending: Internal Medicine | Admitting: Internal Medicine

## 2021-05-14 VITALS — BP 130/70 | HR 56 | Wt 272.0 lb

## 2021-05-14 DIAGNOSIS — Z7984 Long term (current) use of oral hypoglycemic drugs: Secondary | ICD-10-CM | POA: Insufficient documentation

## 2021-05-14 DIAGNOSIS — I110 Hypertensive heart disease with heart failure: Secondary | ICD-10-CM | POA: Diagnosis present

## 2021-05-14 DIAGNOSIS — R0683 Snoring: Secondary | ICD-10-CM

## 2021-05-14 DIAGNOSIS — I493 Ventricular premature depolarization: Secondary | ICD-10-CM

## 2021-05-14 DIAGNOSIS — E039 Hypothyroidism, unspecified: Secondary | ICD-10-CM | POA: Insufficient documentation

## 2021-05-14 DIAGNOSIS — Z79899 Other long term (current) drug therapy: Secondary | ICD-10-CM | POA: Diagnosis not present

## 2021-05-14 DIAGNOSIS — I5022 Chronic systolic (congestive) heart failure: Secondary | ICD-10-CM

## 2021-05-14 DIAGNOSIS — B9561 Methicillin susceptible Staphylococcus aureus infection as the cause of diseases classified elsewhere: Secondary | ICD-10-CM | POA: Diagnosis not present

## 2021-05-14 DIAGNOSIS — I700 Atherosclerosis of aorta: Secondary | ICD-10-CM | POA: Diagnosis not present

## 2021-05-14 DIAGNOSIS — R6521 Severe sepsis with septic shock: Secondary | ICD-10-CM | POA: Diagnosis not present

## 2021-05-14 DIAGNOSIS — Z6839 Body mass index (BMI) 39.0-39.9, adult: Secondary | ICD-10-CM | POA: Insufficient documentation

## 2021-05-14 DIAGNOSIS — I5043 Acute on chronic combined systolic (congestive) and diastolic (congestive) heart failure: Secondary | ICD-10-CM

## 2021-05-14 DIAGNOSIS — I440 Atrioventricular block, first degree: Secondary | ICD-10-CM | POA: Insufficient documentation

## 2021-05-14 DIAGNOSIS — E785 Hyperlipidemia, unspecified: Secondary | ICD-10-CM | POA: Diagnosis not present

## 2021-05-14 DIAGNOSIS — Z7182 Exercise counseling: Secondary | ICD-10-CM | POA: Insufficient documentation

## 2021-05-14 DIAGNOSIS — I509 Heart failure, unspecified: Secondary | ICD-10-CM | POA: Diagnosis not present

## 2021-05-14 DIAGNOSIS — R7881 Bacteremia: Secondary | ICD-10-CM | POA: Diagnosis not present

## 2021-05-14 DIAGNOSIS — I5042 Chronic combined systolic (congestive) and diastolic (congestive) heart failure: Secondary | ICD-10-CM | POA: Diagnosis not present

## 2021-05-14 DIAGNOSIS — I472 Ventricular tachycardia, unspecified: Secondary | ICD-10-CM | POA: Diagnosis not present

## 2021-05-14 LAB — BASIC METABOLIC PANEL
Anion gap: 10 (ref 5–15)
BUN: 17 mg/dL (ref 8–23)
CO2: 29 mmol/L (ref 22–32)
Calcium: 9.1 mg/dL (ref 8.9–10.3)
Chloride: 98 mmol/L (ref 98–111)
Creatinine, Ser: 1.06 mg/dL (ref 0.61–1.24)
GFR, Estimated: >60 mL/min (ref >60)
Glucose, Bld: 98 mg/dL (ref 70–99)
Potassium: 4.2 mmol/L (ref 3.5–5.1)
Sodium: 137 mmol/L (ref 135–145)

## 2021-05-14 LAB — ECHOCARDIOGRAM COMPLETE
Area-P 1/2: 2.75 cm2
Calc EF: 30.7 %
S' Lateral: 5.75 cm
Single Plane A2C EF: 21.9 %
Single Plane A4C EF: 35.9 %

## 2021-05-14 MED ORDER — PERFLUTREN LIPID MICROSPHERE
1.0000 mL | INTRAVENOUS | Status: DC | PRN
  Administered 2021-05-14: 2 mL via INTRAVENOUS
  Filled 2021-05-14: qty 10

## 2021-05-14 MED ORDER — EMPAGLIFLOZIN 10 MG PO TABS: 10.0000 mg | ORAL_TABLET | Freq: Every day | ORAL | 3 refills | Status: DC

## 2021-05-14 MED ORDER — SPIRONOLACTONE 25 MG PO TABS: 12.5000 mg | ORAL_TABLET | Freq: Every day | ORAL | 3 refills | Status: DC

## 2021-05-14 NOTE — Progress Notes (Signed)
ADVANCED HF CLINIC CONSULT NOTE  Primary Care: Albina Billet, MD Primary HF Cardiologist: Dr. Haroldine Laws  HPI: [name] is a 76 y.o. with history includes combined systolic/diastolic HF, HTN, HLD , PVCs, and obesity. Had Echo 04/18/20  LVEF 60-65% w/ G2DD and normal RV.   Admitted to [facility] 01/18/21  cute HFrEF. Echo  EF 25-30% from previous EF 60-65%. R/LHC w/ no CAD, moderately elevated left heart and pulmonary artery pressures, severely elevated right heart filling pressures, severely reduced Fick cardiac output/index.  Required intropes, pressor, and lasix drip.  Transferred to [facility] for cardiogenic/septic shock, + MSSA bacteremia. Underwent TEE showing EF 25-30%, RV moderately down, severe biatrial enlargement and moderate MR, no valvular vegetation. Required addition of mexitiline to suppress PVCs. Hospitalization c/b AKI and hypoxic/hypercarbic respiratory failure. Drips gradually weaned off.  GDMT not pursued with hypotension. Placed on midodrine. Discharged SNF for IV antibiotics. D/C weight 291. Discharged 02/11/2021  Discharged from [facility] 03/11/21 after he completed IV antibiotics and rehab.   Last seen for f/u 12/22. Volume stable. Midodrine decreased. Sleep study ordered.   Overall feeling fine. Says he feels much better. Occasionally tired but ok if he slows down. Denies PND/Orthopnea. Rides stationary bike 30 minutes a day and walking 10-12 minutes a day. Appetite ok. No fever or chills. Weight at home 267 pounds. Taking all medications. Works everyday at his [employer].   Cardiac Studies Echo today EF 30%  RV normal.  Echo 04/2020 EF 60-65%.   LHC/RHC 01/2021  no CAD  RA 20 PA 48/29 (35)  PCWP 25 CO 3.7 CI 1.4  Past Medical History:  Diagnosis Date   Depression    Hypertension    Hypothyroidism    Current Outpatient Medications  Medication Sig Dispense Refill   Ascorbic Acid (VITAMIN C) 1000 MG tablet Take 1,000 mg by mouth daily.     aspirin 325 MG tablet Take 325 mg  by mouth daily.     Cholecalciferol (VITAMIN D3) 125 MCG (5000 UT) CAPS Take 1 capsule by mouth daily.     folic acid (FOLVITE) 703 MCG tablet Take 400 mcg by mouth daily.     levothyroxine (SYNTHROID) 50 MCG tablet Take 50 mcg by mouth daily before breakfast.     mexiletine (MEXITIL) 150 MG capsule Take 1 capsule (150 mg total) by mouth every 12 (twelve) hours. 60 capsule 0   midodrine (PROAMATINE) 5 MG tablet Take 1 tablet (5 mg total) by mouth 3 (three) times daily. 90 tablet 3   pravastatin (PRAVACHOL) 20 MG tablet Take 20 mg by mouth daily.     torsemide 40 MG TABS Take 40 mg by mouth daily. 30 tablet 0   vitamin B-12 (CYANOCOBALAMIN) 100 MCG tablet Take 100 mcg by mouth daily.     No current facility-administered medications for this encounter.   Facility-Administered Medications Ordered in Other Encounters  Medication Dose Route Frequency Provider Last Rate Last Admin   perflutren lipid microspheres (DEFINITY) IV suspension  1-10 mL Intravenous PRN Kymberly Blomberg, Shaune Pascal, MD   2 mL at 05/14/21 1333   No Known Allergies  Social History   Socioeconomic History   Marital status: Married    Spouse name: Holston Oyama   Number of children: Not on file   Years of education: Not on file   Highest education level: Not on file  Occupational History   Not on file  Tobacco Use   Smoking status: Former   Smokeless tobacco: Former  Types: Chew  Vaping Use   Vaping Use: Unknown  Substance and Sexual Activity   Alcohol use: No   Drug use: Not on file   Sexual activity: Not on file  Other Topics Concern   Not on file  Social History Narrative   Not on file   Social Determinants of Health   Financial Resource Strain: Not on file  Food Insecurity: Not on file  Transportation Needs: Not on file  Physical Activity: Not on file  Stress: Not on file  Social Connections: Not on file  Intimate Partner Violence: Not on file   History reviewed. No pertinent family history. Vitals:    05/14/21 1356  BP: 130/70  Pulse: (!) 56  SpO2: 97%   BP 130/70    Pulse (!) 56    Wt 123.4 kg (272 lb)    SpO2 97%    BMI 39.03 kg/m   Wt Readings from Last 3 Encounters:  05/14/21 123.4 kg (272 lb)  03/31/21 125.6 kg (276 lb 12.8 oz)  03/25/21 125.7 kg (277 lb 1 oz)   PHYSICAL EXAM: General:  NAD. No resp difficulty, walked into clinic with rolling walker HEENT: Normal Neck: Supple. Thick neck. Carotids 2+ bilat; no bruits. No lymphadenopathy or thryomegaly appreciated. Cor: PMI nondisplaced. Regular rate & rhythm. No rubs, gallops or murmurs. Lungs: Clear Abdomen: Obese, nontender, nondistended. No hepatosplenomegaly. No bruits or masses. Good bowel sounds. Extremities: No cyanosis, clubbing, rash, edema; compression hose on Neuro: Alert & oriented x 3, cranial nerves grossly intact. Moves all 4 extremities w/o difficulty. Affect pleasant.    ASSESSMENT & PLAN:  1. Chronic Systolic Heart Failure  - Recent admit with cardiogenic/septic shock - Echo (10/22): EF down from 55-->20-25%.  - R/LHC (10/22): with no CAD, elevated filling pressures and low output heart failure.  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  - Echo 05/14/21 Ef 30% RV normal  - NYHA II. Previously GDMT was limited by hypotension but now improved.  - Volume status stable. Continue torsemide to 20 mg daily  - Start jardiance 10 mg daily - -Add spironolactone 12.5 mg daily . BMET in 7 days.  - Plan to repeat ECHO in 3 months once we can get him on GDMT. If EF at that time remains < 35% will need referral for ICD.   - Sleep study ordered at last visit but has not been completed. Will check on home sleep study.    2. NSVT, PVC  - .In SR . No PVCs.  - Continue mexilitene 150 mg bid. - Sleep study   3. MSSA bacteremia - Likely respiratory source - TEE (10/22) - no vegetation  - Completed Cefazolin  4. Chronic Hypoxic/Hypercarbia Respiratory Failure   - Resolved.   5. Morbid Obesity  - Body mass index is 39.03 kg/m.  6. Suspected Sleep Apnea - Sleep study as above.   7. Physical Deconditioning - Completed HH - Doing great exercising at home.   Follow up in 3 weeks with pharmacy  Follow up in 3 months with Dr Haroldine Laws and an ECHO.    [name]  2:00 PM  Patient seen and examined with the above-signed Advanced Practice Provider and/or Housestaff. I personally reviewed laboratory data, imaging studies and relevant notes. I independently examined the patient and formulated the important aspects of the plan. I have edited the note to reflect any of my changes or salient points. I have personally  discussed the plan with the patient and/or family.  He is dramatically improved. Doing 40 mins of exercise per day.   Mild DOE. Denies edema, orthopnea or PND. PVCs suppressed on mexilitene.   Echo today with very limited images. EF ~30%.   General:  Obese male No resp difficulty HEENT: normal Neck: supple. no JVD. Carotids 2+ bilat; no bruits. No lymphadenopathy or thryomegaly appreciated. Cor: PMI nondisplaced. Regular rate & rhythm. No rubs, gallops or murmurs. Lungs: clear Abdomen: obese soft, nontender, nondistended. No hepatosplenomegaly. No bruits or masses. Good bowel sounds. Extremities: no cyanosis, clubbing, rash, edema Neuro: alert & orientedx3, cranial nerves grossly intact. moves all 4 extremities w/o difficulty. Affect pleasant  Symptomatically much improved but EF still down. Agree with stopping midodrine and beginning to add GDMT. Continue PVC suppression with mexilitene. Check sleep study.   F/u PharmD for continue med titration. Encouraged him to keep up with exercise program.   Glori Bickers, MD  11:09 PM

## 2021-05-14 NOTE — Patient Instructions (Signed)
Medication Changes:  Stop Midodrine  Start Jardiance 10mg  Daily  Lab Work:  Labs done today, your results will be available in MyChart, we will contact you for abnormal readings.   Testing/Procedures:  Your provider has recommended that you have a home sleep study.  We have provided you with the equipment in our office today. Please download the app and follow the instructions. YOUR PIN NUMBER IS: [id number]. Once you have completed the test you just dispose of the equipment, the information is automatically uploaded to [place] via blue-tooth technology. If your test is positive for sleep apnea and you need a home CPAP machine you will be contacted by Dr Theodosia Blender office [facility]) to set this up.   Referrals:  Clinic Pharmacy  Special Instructions // Education:  none  Follow-Up in: 3 months  At the [facility], you and your health needs are our priority. We have a designated team specialized in the treatment of Heart Failure. This Care Team includes your primary Heart Failure Specialized Cardiologist (physician), Advanced Practice Providers (APPs- Physician Assistants and Nurse Practitioners), and Pharmacist who all work together to provide you with the care you need, when you need it.   You may see any of the following providers on your designated Care Team at your next follow up:  Dr Glori Bickers Dr Haynes Kerns, NP Lyda Jester, [place] [facility] [place], [place] Audry Riles, PharmD   Please be sure to bring in all your medications bottles to every appointment.   Need to Contact us:  If you have any questions or concerns before your next appointment please send [place] a message through [place] or call our office at [phone].    TO LEAVE A MESSAGE FOR THE NURSE SELECT OPTION 2, PLEASE LEAVE A MESSAGE INCLUDING: YOUR NAME DATE OF BIRTH CALL BACK NUMBER REASON FOR CALL**this is important as we prioritize the call backs  YOU WILL  RECEIVE A CALL BACK THE SAME DAY AS LONG AS YOU CALL BEFORE 4:00 PM

## 2021-05-14 NOTE — Progress Notes (Signed)
Patient was given home sleep study device and instructions for use today during clinic appt.  He and wife tell me that they will have children assist with downloading the App for phone and process of performing the study.  He will attempt to complete the study within one week.  I have reinforced that is they have issues with App or completing the study that they call me and will brign back the device.

## 2021-05-16 ENCOUNTER — Encounter (HOSPITAL_BASED_OUTPATIENT_CLINIC_OR_DEPARTMENT_OTHER): Payer: Medicare HMO | Admitting: Cardiology

## 2021-05-16 DIAGNOSIS — I5021 Acute systolic (congestive) heart failure: Secondary | ICD-10-CM

## 2021-05-17 NOTE — Procedures (Addendum)
° °  Sleep Study Report  Patient Information Study Date: 05/16/21 Patient Name: Anthony Weber Patient ID: [id number] Birth Date: Nov 12, 2045 Age: 76 Gender: Male Referring Physician: Glori Bickers, MD  TEST DESCRIPTION: Home sleep apnea testing was completed using the WatchPat, a Type 1 device, utilizing peripheral arterial tonometry (PAT), chest movement, actigraphy, pulse oximetry, pulse rate, body position and snore. AHI was calculated with apnea and hypopnea using valid sleep time as the denominator. RDI includes apneas, hypopneas, and RERAs. The data acquired and the scoring of sleep and all associated events were performed in accordance with the recommended standards and specifications as outlined in the AASM Manual for the Scoring of Sleep and Associated Events 2.2.0 (2015).  FINDINGS: 1. No evidence of Obstructive Sleep Apnea with AHI 4.5/hr. 2. No Central Sleep Apnea. 3. Oxygen desaturations as low as 87%. 4. Minimal snoring was present. O2 sats were < 88% for 1.7 minutes. 5. Total sleep time was 5 hrs and 49 min. 6. 28.2% of total sleep time was spent in REM sleep. 7. Shortened sleep onset latency at 6 min. 8. Shortened REM sleep onset latency at 39 min. 9. Total awakenings were 7.  DIAGNOSIS: Normal study with no significant sleep disordered breathing.  RECOMMENDATIONS: 1. Normal study with no significant sleep disordered breathing.  2. Healthy sleep recommendations include: adequate nightly sleep (normal 7-9 hrs/night), avoidance of caffeine after noon and alcohol near bedtime, and maintaining a sleep environment that is cool, dark and quiet.  3. Weight loss for overweight patients is recommended.  4. Snoring recommendations include: weight loss where appropriate, side sleeping, and avoidance of alcohol before bed.  5. Operation of motor vehicle or dangerous equipment must be avoided when feeling drowsy, excessively sleepy, or mentally fatigued.  6. An ENT  consultation which may be useful for specific causes of and possible treatment of bothersome snoring .  7. Weight loss may be of benefit in reducing the severity of snoring.   Signature: Electronically Signed: 05/17/21 Fransico Him, MD; [facility]; [place], American Board of Sleep Medicine

## 2021-05-19 ENCOUNTER — Ambulatory Visit: Payer: Medicare HMO

## 2021-05-19 DIAGNOSIS — I509 Heart failure, unspecified: Secondary | ICD-10-CM

## 2021-05-25 ENCOUNTER — Other Ambulatory Visit (HOSPITAL_COMMUNITY): Payer: Self-pay

## 2021-06-05 ENCOUNTER — Other Ambulatory Visit: Payer: Self-pay | Admitting: Cardiology

## 2021-06-05 ENCOUNTER — Telehealth: Payer: Self-pay | Admitting: *Deleted

## 2021-06-05 DIAGNOSIS — I5021 Acute systolic (congestive) heart failure: Secondary | ICD-10-CM

## 2021-06-05 NOTE — Telephone Encounter (Signed)
-----   Message from Sueanne Margarita, MD sent at 05/17/2021  6:53 PM EST ----- Normal home sleep study so in lab PSG will be ordered

## 2021-06-05 NOTE — Telephone Encounter (Signed)
Patient notified of HST results and recommendations. He hesitantly agrees to proceed with in lab sleep study.

## 2021-06-05 NOTE — Progress Notes (Incomplete)
***In Progress***    Advanced Heart Failure Clinic Note   Primary Care: Albina Billet, MD Primary HF Cardiologist: Dr. Haroldine Laws  HPI:  [name] is a 76 y.o. with history includes combined systolic/diastolic HF, HTN, HLD , PVCs, and obesity. Had Echo 04/18/2020  LVEF 60-65% w/ G2DD and normal RV.   Admitted to [facility] 01/18/2021  acute HFrEF. Echo  EF 25-30% from previous EF 60-65%. R/LHC w/ no CAD, moderately elevated left heart and pulmonary artery pressures, severely elevated right heart filling pressures, severely reduced Fick cardiac output/index.  Required intropes, pressor, and lasix drip.  Transferred to [facility] for cardiogenic/septic shock, + MSSA bacteremia. Underwent TEE showing EF 25-30%, RV moderately down, severe biatrial enlargement and moderate MR, no valvular vegetation. Required addition of mexitiline to suppress PVCs. Hospitalization c/b AKI and hypoxic/hypercarbic respiratory failure. Drips gradually weaned off.  GDMT not pursued with hypotension. Placed on midodrine. Discharged SNF for IV antibiotics. D/C weight 291. Discharged 02/11/2021   Discharged from [facility] 03/11/2021 after he completed IV antibiotics and rehab.    Last seen for f/u 03/2021. Volume stable. Midodrine decreased. Sleep study ordered.    Presented to clinic on 05/14/2021 where he was overall feeling much better. He was occasionally tired but ok if he slowed down. Denied PND/Orthopnea. He rides a stationary bike 30 minutes a day and walks 10-12 minutes a day. Appetite ok. No fever or chills. Weight at home was 267 pounds. Taking all medications. Works everyday at his [employer].    Today he returns to HF clinic for pharmacist medication titration. At last visit with MD jardiance 10 mg daily and spironolactone 12.5 mg daily was initiated.   Overall feeling ***. Dizziness, lightheadedness, fatigue:  Chest pain or palpitations:  How is your breathing?: *** SOB: Able to complete all ADLs. Activity level  ***  Weight at home pounds. Takes furosemide/torsemide/bumex *** mg *** daily.  LEE PND/Orthopnea  Appetite *** Low-salt diet:   Physical Exam Cost/affordability of meds   HF Medications: Spironolactone 12.5 mg daily Jardiance 10 mg daily  Torsemide 20 mg daily  Has the patient been experiencing any side effects to the medications prescribed?  {YES NO:22349}  Does the patient have any problems obtaining medications due to transportation or finances?   {YES NO:22349}  Understanding of regimen: {excellent/good/fair/poor:19665} Understanding of indications: {excellent/good/fair/poor:19665} Potential of compliance: {excellent/good/fair/poor:19665} Patient understands to avoid NSAIDs. Patient understands to avoid decongestants.    Pertinent Lab Values: Labs 05/14/2021: Serum creatinine 1.06, BUN 17, Potassium 4.2, Sodium 137  Vital Signs: Weight: *** (last clinic weight: 272 lbs) Blood pressure: *** 130/70 Heart rate: *** 56  Plan BMET today- did not get 7 day BMET after spiro start A. Losartan 25 mg daily B increase spiro to 25 mg daily- BMET 7 days Don't think can tolerate BB with low HR at BL Follow up with pharmacy clinic in 1 month  Assessment/Plan: 1. Chronic systolic CHF (EF ***), due to ***. NYHA class *** symptoms. 1. Chronic Systolic Heart Failure  - Recent admit with cardiogenic/septic shock - Echo (01/2021): EF down from 55-->20-25%.  - R/LHC (01/2021): with no CAD, elevated filling pressures and low output heart failure.  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  - Echo 05/14/2021 Ef 30% RV normal  - NYHA II. Previously GDMT was limited by hypotension but now improved.  - Volume status stable. Continue torsemide to 20 mg daily  - Continue  jardiance 10  mg daily - Continue spironolactone 12.5 mg daily - Plan to repeat ECHO in 3 months once we can get him on GDMT. If EF at that time remains < 35% will  need referral for ICD.   - Sleep study ordered at last visit but has not been completed. Will check on home sleep study.    2. NSVT, PVC  - .In SR . No PVCs.  - Continue mexilitene 150 mg bid. - Sleep study   3. MSSA bacteremia - Likely respiratory source - TEE (01/2021) - no vegetation  - Completed Cefazolin   4. Chronic Hypoxic/Hypercarbia Respiratory Failure  - Resolved.    5. Morbid Obesity  - Body mass index is 39.03 kg/m.   6. Suspected Sleep Apnea - Sleep study as above.    7. Physical Deconditioning - Completed HH - Doing great exercising at home.    Follow up in 3 weeks with pharmacy  Follow up in 3 months with Dr Haroldine Laws and an ECHO.      [name]  2:00 PM   Patient seen and examined with the above-signed Advanced Practice Provider and/or Housestaff. I personally reviewed laboratory data, imaging studies and relevant notes. I independently examined the patient and formulated the important aspects of the plan. I have edited the note to reflect any of my changes or salient points. I have personally discussed the plan with the patient and/or family.   He is dramatically improved. Doing 40 mins of exercise per day.    Mild DOE. Denies edema, orthopnea or PND. PVCs suppressed on mexilitene.    Echo today with very limited images. EF ~30%.    General:  Obese male No resp difficulty HEENT: normal Neck: supple. no JVD. Carotids 2+ bilat; no bruits. No lymphadenopathy or thryomegaly appreciated. Cor: PMI nondisplaced. Regular rate & rhythm. No rubs, gallops or murmurs. Lungs: clear Abdomen: obese soft, nontender, nondistended. No hepatosplenomegaly. No bruits or masses. Good bowel sounds. Extremities: no cyanosis, clubbing, rash, edema Neuro: alert & orientedx3, cranial nerves grossly intact. moves all 4 extremities w/o difficulty. Affect pleasant   Symptomatically much improved but EF still down. Agree with stopping midodrine and beginning to add GDMT.  Continue PVC suppression with mexilitene. Check sleep study.    F/u PharmD for continue med titration. Encouraged him to keep up with exercise program.   Follow up ***  Audry Riles, PharmD, BCPS, BCCP, CPP Heart Failure Clinic Pharmacist [phone]

## 2021-06-05 NOTE — Telephone Encounter (Signed)
Patient notified of in lab sleep study appointment. He wants to change the date. [facility] contact information provided to the patient to call to re schedule.

## 2021-06-08 ENCOUNTER — Ambulatory Visit (HOSPITAL_COMMUNITY)
Admission: RE | Admit: 2021-06-08 | Discharge: 2021-06-08 | Disposition: A | Discharge status: Home / Self Care | Payer: Medicare HMO | Source: Ambulatory Visit | Attending: Cardiology | Admitting: Cardiology

## 2021-06-08 ENCOUNTER — Other Ambulatory Visit: Payer: Self-pay

## 2021-06-08 ENCOUNTER — Telehealth (HOSPITAL_COMMUNITY): Payer: Self-pay | Admitting: Pharmacist

## 2021-06-08 VITALS — BP 130/76 | HR 70 | Wt 264.0 lb

## 2021-06-08 DIAGNOSIS — I5021 Acute systolic (congestive) heart failure: Secondary | ICD-10-CM | POA: Diagnosis not present

## 2021-06-08 DIAGNOSIS — Z79899 Other long term (current) drug therapy: Secondary | ICD-10-CM | POA: Insufficient documentation

## 2021-06-08 DIAGNOSIS — E785 Hyperlipidemia, unspecified: Secondary | ICD-10-CM | POA: Insufficient documentation

## 2021-06-08 DIAGNOSIS — Z6839 Body mass index (BMI) 39.0-39.9, adult: Secondary | ICD-10-CM | POA: Diagnosis not present

## 2021-06-08 DIAGNOSIS — I5022 Chronic systolic (congestive) heart failure: Secondary | ICD-10-CM | POA: Diagnosis not present

## 2021-06-08 DIAGNOSIS — I110 Hypertensive heart disease with heart failure: Secondary | ICD-10-CM | POA: Insufficient documentation

## 2021-06-08 DIAGNOSIS — I4729 Other ventricular tachycardia: Secondary | ICD-10-CM | POA: Diagnosis not present

## 2021-06-08 LAB — BASIC METABOLIC PANEL
Anion gap: 12 (ref 5–15)
BUN: 29 mg/dL — ABNORMAL HIGH (ref 8–23)
CO2: 25 mmol/L (ref 22–32)
Calcium: 9.3 mg/dL (ref 8.9–10.3)
Chloride: 98 mmol/L (ref 98–111)
Creatinine, Ser: 1.31 mg/dL — ABNORMAL HIGH (ref 0.61–1.24)
GFR, Estimated: 56 mL/min — ABNORMAL LOW (ref >60)
Glucose, Bld: 100 mg/dL — ABNORMAL HIGH (ref 70–99)
Potassium: 4.7 mmol/L (ref 3.5–5.1)
Sodium: 135 mmol/L (ref 135–145)

## 2021-06-08 MED ORDER — TORSEMIDE 20 MG PO TABS: 20.0000 mg | ORAL_TABLET | Freq: Every day | ORAL | 5 refills | Status: DC

## 2021-06-08 MED ORDER — LOSARTAN POTASSIUM 25 MG PO TABS: 12.5000 mg | ORAL_TABLET | Freq: Every day | ORAL | 3 refills | Status: DC

## 2021-06-08 MED ORDER — PRAVASTATIN SODIUM 20 MG PO TABS: 20.0000 mg | ORAL_TABLET | Freq: Every day | ORAL | 3 refills | Status: DC

## 2021-06-08 NOTE — Progress Notes (Signed)
Advanced Heart Failure Clinic Note   Primary Care: Albina Billet, MD Primary HF Cardiologist: Dr. Haroldine Laws  HPI:  Anthony Weber is a 76 y.o. whose history includes combined systolic/diastolic HF, HTN, HLD, PVCs, and obesity. Had Echo 04/18/2020  LVEF 60-65% w/ G2DD and normal RV.   Admitted to [facility] 01/18/2021 with acute HFrEF. Echo EF 25-30% from previous EF 60-65%. R/LHC w/ no CAD, moderately elevated left heart and pulmonary artery pressures, severely elevated right heart filling pressures, severely reduced Fick cardiac output/index.  Required intropes, pressor, and Lasix gtt.  Transferred to [facility] for cardiogenic/septic shock, + MSSA bacteremia. Underwent TEE showing EF 25-30%, RV moderately down, severe biatrial enlargement and moderate Anthony, no valvular vegetation. Required addition of mexiletine to suppress PVCs. Hospitalization c/b AKI and hypoxic/hypercarbic respiratory failure. Drips gradually weaned off.  GDMT not pursued with hypotension. Placed on midodrine. Discharged SNF for IV antibiotics. D/C weight 291. Discharged 02/11/2021   Discharged from [facility] 03/11/2021 after he completed IV antibiotics and rehab.    Seen for f/u 03/2021. Volume stable. Midodrine decreased. Sleep study ordered.    Presented to clinic on 05/14/2021 where he was overall feeling much better. He was occasionally tired but ok if he slowed down. Denied PND/Orthopnea. He reported that he rides a stationary bike 30 minutes a day and walks 10-12 minutes a day. Appetite was ok. No fever or chills. Weight at home was 267 pounds. Was taking all medications. Works everyday at his [employer].    Today he returns to HF clinic for pharmacist medication titration. At last visit with MD, jardiance 10 mg daily and spironolactone 12.5 mg daily were initiated. Additionally, midodrine was discontinued. Overall he is feeling much better. He does note he has dizziness 2-3 times a week when getting up or standing up for a long time, but  this mostly happens when waking up. Denies lightheadedness, SOB, chest pain or palpitations and his fatigue has improved. He continues to ride his stationary bike and walk and is able to complete all ADLs. His weight at home was 259 lbs this morning. Weight is down 8 lbs from last clinic visit. He has been actively trying to lose weight. He is taking torsemide 40 mg daily. Trace LEE which is normal for him. No PND/orthopnea. He is adherent to a low-salt diet and uses Mrs. Dash. His wife mentions that she is concerned he is not eating enough protein. We discussed a heart healthy diet with lean meats, veggies, whole grains.  HF Medications: Spironolactone 12.5 mg daily Jardiance 10 mg daily  Torsemide 40 mg daily  Has the patient been experiencing any side effects to the medications prescribed?  NO  Does the patient have any problems obtaining medications due to transportation or finances?   Obtained PAN grant for Jardiance today to help with cost.   Understanding of regimen: fair Understanding of indications: fair Potential of compliance: good Patient understands to avoid NSAIDs. Patient understands to avoid decongestants.   Pertinent Lab Values: Labs 05/14/2021: Serum creatinine 1.06, BUN 17, Potassium 4.2, Sodium 137 Labs 06/08/2021: Serum creatine 1.31, BUN 29, Potassium 4.7, Sodium 135  Vital Signs: Weight: 264.0 lbs (last clinic weight: 272 lbs) Blood pressure: 130/76 mmHg Heart rate: 70 bpm  Assessment/Plan: 1. Chronic Systolic Heart Failure  - Recent admit with cardiogenic/septic shock - Echo (01/2021): EF down from 55-->20-25%.  - R/LHC (01/2021): with no CAD, elevated filling pressures and low output heart failure.  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  - Echo 05/14/2021 EF 30%, RV normal  - NYHA II. Previously GDMT was limited by hypotension but now improved.  -Labs resulted after clinic visit completed indicating  dehydration/AKI. Scr increased from 1.06 to 1.31 and BUN increased from 17 to 29. Patient was called and expressed understanding regarding the following medication changes: - Volume status dry. Hold torsemide for 1 day then decrease from torsemide 40 mg daily to 20 mg daily. Repeat BMET in 2 weeks. - Prescription sent for losartan 12.5 mg daily  during clinic visit- instructed not to start until BMET result in 2 weeks is evaluated. - Continue Jardiance 10 mg daily - Continue spironolactone 12.5 mg daily - Repeat ECHO in 2 months once he is on GDMT. If EF at that time remains < 35% will need referral for ICD.   - Sleep study ordered at last visit but has not been completed.    2. NSVT, PVC  - Was in NSR, No PVCs in clinic 05/14/21.  - Continue mexiletine 150 mg BID. - Sleep study   3. MSSA bacteremia - Likely respiratory source - TEE (01/2021) - no vegetation  - Completed Cefazolin   4. Chronic Hypoxic/Hypercarbia Respiratory Failure  - Resolved.    5. Morbid Obesity  - Body mass index is 39.03 kg/m.   6. Suspected Sleep Apnea - Sleep study as above.    7. Physical Deconditioning - Completed HH - Doing great exercising at home.    Follow up BMET 06/22/21 and 1 month with pharmacy clinic and 2 months with Dr Haroldine Laws and an ECHO.   Audry Riles, PharmD, BCPS, BCCP, CPP Heart Failure Clinic Pharmacist [phone]

## 2021-06-08 NOTE — Patient Instructions (Signed)
It was a pleasure seeing you today!  MEDICATIONS: -We are changing your medications today -Start losartan 12.5 mg (half-tablet) daily  -Call if you have questions about your medications.  LABS: -We will call you if your labs need attention.  NEXT APPOINTMENT: Return to clinic in 1 month with phamacy clinic.  In general, to take care of your heart failure: -Limit your fluid intake to 2 Liters (half-gallon) per day.   -Limit your salt intake to ideally 2-3 grams (2000-3000 mg) per day. -Weigh yourself daily and record, and bring that "weight diary" to your next appointment.  (Weight gain of 2-3 pounds in 1 day typically means fluid weight.) -The medications for your heart are to help your heart and help you live longer.   -Please contact us before stopping any of your heart medications.  Call the clinic at [phone] with questions or to reschedule future appointments.

## 2021-06-08 NOTE — Telephone Encounter (Signed)
Was successful in securing patient an $56 grant from Patient [employer] [facility]) to provide copayment coverage for Jardiance.  This will keep the out of pocket expense at $0.     I have spoken with the patient.    The billing information is as follows and has been shared with the pharmacy.   Member ID: [id number] Group ID: [id number] RxBin ID: [id number] PCN: [id number] Eligibility Start Date: 03/10/2021 Eligibility End Date: 06/07/2022 Assistance Amount: $1,200.00  Fund:  Heart Failure  Audry Riles, PharmD, BCPS, BCCP, CPP Heart Failure Clinic Pharmacist [phone]

## 2021-06-22 ENCOUNTER — Ambulatory Visit (HOSPITAL_COMMUNITY)
Admission: RE | Admit: 2021-06-22 | Discharge: 2021-06-22 | Disposition: A | Discharge status: Home / Self Care | Payer: Medicare HMO | Source: Ambulatory Visit | Attending: Internal Medicine | Admitting: Internal Medicine

## 2021-06-22 ENCOUNTER — Other Ambulatory Visit: Payer: Self-pay

## 2021-06-22 DIAGNOSIS — I5022 Chronic systolic (congestive) heart failure: Secondary | ICD-10-CM | POA: Diagnosis present

## 2021-06-22 LAB — BASIC METABOLIC PANEL
Anion gap: 9 (ref 5–15)
BUN: 20 mg/dL (ref 8–23)
CO2: 24 mmol/L (ref 22–32)
Calcium: 9 mg/dL (ref 8.9–10.3)
Chloride: 102 mmol/L (ref 98–111)
Creatinine, Ser: 1.17 mg/dL (ref 0.61–1.24)
GFR, Estimated: >60 mL/min (ref >60)
Glucose, Bld: 105 mg/dL — ABNORMAL HIGH (ref 70–99)
Potassium: 5 mmol/L (ref 3.5–5.1)
Sodium: 135 mmol/L (ref 135–145)

## 2021-06-30 ENCOUNTER — Other Ambulatory Visit (HOSPITAL_COMMUNITY): Payer: Self-pay | Admitting: Family Medicine

## 2021-07-04 ENCOUNTER — Other Ambulatory Visit (HOSPITAL_COMMUNITY): Payer: Self-pay | Admitting: Family Medicine

## 2021-07-04 NOTE — Progress Notes (Incomplete)
*** in progress ?  ?Advanced Heart Failure Clinic Note  ? ?Primary Care: Albina Billet, MD ?Primary HF Cardiologist: Dr. Haroldine Laws ? ?HPI:  ?Anthony Weber is a 76 y.o. whose history includes combined systolic/diastolic HF, HTN, HLD, PVCs, and obesity. Had Echo 04/18/2020  LVEF 60-65% w/ G2DD and normal RV. ?  ?Admitted to [facility] 01/18/2021 with acute HFrEF. Echo EF 25-30% from previous EF 60-65%. R/LHC w/ no CAD, moderately elevated left heart and pulmonary artery pressures, severely elevated right heart filling pressures, severely reduced Fick cardiac output/index.  Required intropes, pressor, and Lasix gtt.  Transferred to [facility] for cardiogenic/septic shock, + MSSA bacteremia. Underwent TEE showing EF 25-30%, RV moderately down, severe biatrial enlargement and moderate MR, no valvular vegetation. Required addition of mexiletine to suppress PVCs. Hospitalization c/b AKI and hypoxic/hypercarbic respiratory failure. Drips gradually weaned off.  GDMT not pursued with hypotension. Placed on midodrine. Discharged SNF for IV antibiotics. D/C weight 291. Discharged 02/11/2021 ?  ?Discharged from [facility] 03/11/2021 after he completed IV antibiotics and rehab.  ?  ?Seen for f/u 03/2021. Volume stable. Midodrine decreased. Sleep study ordered.  ?  ?Presented to clinic on 05/14/2021 where he was overall feeling much better. He was working on being more active. He was taking all medications and working everyday at his [employer]. Jardiance 10 mg daily and spironolactone 12.5 mg daily were initiated and midodrine was discontinued.  ? ?Sleep study done on 05/16/2021 indicated no obstructive sleep apnea.  ?  ?He returned to pharmacy clinic on 06/08/2021,where overall he was feeling much better. He did note dizziness 2-3 times a week when getting up or standing up for a long time, but this mostly happened when waking up. Denied lightheadedness, SOB, chest pain or palpitations and his fatigue had improved. He continued to ride his stationary  bike and walk and was able to complete all ADLs. His weight at home was 259 lbs, and his clinic weight was down 8 lbs from visit. He had been actively trying to lose weight. He was taking torsemide 40 mg daily. He had trace LEE which is normal for him. No PND/orthopnea. He was adherent to a low-salt diet and was using Mrs. Dash. His wife mentioned that she is concerned he is not eating enough protein. We discussed a heart healthy diet with lean meats, veggies, whole grains. ? ?Today he returns to HF clinic for pharmacist medication titration. At last visit with pharmacy clinic lab work revealed dry volume status was and patient had an AKI. Torsemide  was held for a day and then reduced from 40 to 20 mg daily.Follow-up BMET indicated improved creatine.  ? ?Overall feeling ***. ?Dizziness, lightheadedness, fatigue:  ?Chest pain or palpitations: ? ?How is your breathing?: *** ?SOB: ?Able to complete all ADLs. Activity level *** ? ?Weight at home pounds. Takes furosemide/torsemide/bumex *** mg *** daily.  ?LEE ?PND/Orthopnea ? ?Appetite *** ?Low-salt diet:  ? ?Physical Exam ?Cost/affordability of meds  ? ?HF Medications: ?Spironolactone 12.5 mg daily ?Jardiance 10 mg daily  ?Torsemide 20 mg daily ? ?Has the patient been experiencing any side effects to the medications prescribed?  NO ? ?Does the patient have any problems obtaining medications due to transportation or finances?   ?Has PAN grant for Jardiance to help with cost.  ? ?Understanding of regimen: fair ?Understanding of indications: fair ?Potential of compliance: good ?Patient understands to avoid NSAIDs. ?Patient understands to avoid decongestants. ?  ?Pertinent Lab Values: ?Labs 06/22/2021: Serum creatine 1.17, BUN 20, Potassium 5.0,  Sodium 135 ? ?Vital Signs: ?Weight: *** lbs (last clinic weight: 264 lbs) 272 ?Blood pressure: *** 130/76 mmHg ?Heart rate: *** 70 bpm ? ? ?Plan ?BMET today- ?BNP today, last 354, help w/ vol assessment ?A.carvedilol 3.125 bid -  will help with PVC suppression also  ?B. Losartan 12.5- concerned about K, lokelma- 10 g daily (cost issues?), drop torsemide to 40 mg PRN wt gain ? ?Assessment/Plan: ?1. Chronic Systolic Heart Failure  ?- Recent admit with cardiogenic/septic shock ?- Echo (01/2021): EF down from 55-->20-25%.  ?- R/LHC (01/2021): with no CAD, elevated filling pressures and low output heart failure.  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 Previously GDMT was limited by hypotension but now improved.  ?-Volume status*** Continue torsemide 20 mg daily ?- Continue Jardiance 10 mg daily ?- Continue spironolactone 12.5 mg daily ?- Repeat ECHO in 1 month now that he has been on GDMT. If EF at that time remains < 35% will need referral for ICD.   ?  ?2. NSVT, PVC  ?- Was in NSR, No PVCs in clinic 05/14/21.  ?- Continue mexiletine 150 mg BID. ? ?3. MSSA bacteremia ?- Likely respiratory source ?- TEE (01/2021) - no vegetation  ?- Completed Cefazolin ?- Resolved ?  ?4. Chronic Hypoxic/Hypercarbia Respiratory Failure  ?- Resolved.  ?  ?5. Morbid Obesity  ?- Body mass index is 39.03 kg/m?. ?  ?6. Suspected Sleep Apnea ?- Sleep study (05/2021) revealed no obstructive sleep apnea ?  ?7. Physical Deconditioning ?- Completed HH ?- Doing great exercising at home.  ?  ?Follow up 1 month with Dr Haroldine Laws and an ECHO.  ? ?Audry Riles, PharmD, BCPS, BCCP, CPP ?Heart Failure Clinic Pharmacist ?[phone] ?

## 2021-07-06 ENCOUNTER — Other Ambulatory Visit: Payer: Self-pay

## 2021-07-06 ENCOUNTER — Ambulatory Visit (HOSPITAL_COMMUNITY)
Admission: RE | Admit: 2021-07-06 | Discharge: 2021-07-06 | Disposition: A | Discharge status: Home / Self Care | Payer: Medicare HMO | Source: Ambulatory Visit | Attending: Cardiology | Admitting: Cardiology

## 2021-07-06 VITALS — BP 128/76 | HR 65 | Wt 261.2 lb

## 2021-07-06 DIAGNOSIS — I5022 Chronic systolic (congestive) heart failure: Secondary | ICD-10-CM

## 2021-07-06 DIAGNOSIS — I5042 Chronic combined systolic (congestive) and diastolic (congestive) heart failure: Secondary | ICD-10-CM | POA: Diagnosis not present

## 2021-07-06 DIAGNOSIS — Z7984 Long term (current) use of oral hypoglycemic drugs: Secondary | ICD-10-CM | POA: Diagnosis not present

## 2021-07-06 DIAGNOSIS — I5021 Acute systolic (congestive) heart failure: Secondary | ICD-10-CM | POA: Diagnosis not present

## 2021-07-06 DIAGNOSIS — I110 Hypertensive heart disease with heart failure: Secondary | ICD-10-CM | POA: Insufficient documentation

## 2021-07-06 DIAGNOSIS — Z6839 Body mass index (BMI) 39.0-39.9, adult: Secondary | ICD-10-CM | POA: Insufficient documentation

## 2021-07-06 DIAGNOSIS — B9561 Methicillin susceptible Staphylococcus aureus infection as the cause of diseases classified elsewhere: Secondary | ICD-10-CM

## 2021-07-06 DIAGNOSIS — J9611 Chronic respiratory failure with hypoxia: Secondary | ICD-10-CM

## 2021-07-06 DIAGNOSIS — Z79899 Other long term (current) drug therapy: Secondary | ICD-10-CM | POA: Diagnosis not present

## 2021-07-06 DIAGNOSIS — I493 Ventricular premature depolarization: Secondary | ICD-10-CM | POA: Insufficient documentation

## 2021-07-06 DIAGNOSIS — I5043 Acute on chronic combined systolic (congestive) and diastolic (congestive) heart failure: Secondary | ICD-10-CM

## 2021-07-06 DIAGNOSIS — I4729 Other ventricular tachycardia: Secondary | ICD-10-CM

## 2021-07-06 DIAGNOSIS — E785 Hyperlipidemia, unspecified: Secondary | ICD-10-CM | POA: Diagnosis not present

## 2021-07-06 DIAGNOSIS — I472 Ventricular tachycardia, unspecified: Secondary | ICD-10-CM | POA: Diagnosis not present

## 2021-07-06 LAB — BASIC METABOLIC PANEL
Anion gap: 7 (ref 5–15)
BUN: 28 mg/dL — ABNORMAL HIGH (ref 8–23)
CO2: 28 mmol/L (ref 22–32)
Calcium: 9.1 mg/dL (ref 8.9–10.3)
Chloride: 98 mmol/L (ref 98–111)
Creatinine, Ser: 1.21 mg/dL (ref 0.61–1.24)
GFR, Estimated: >60 mL/min (ref >60)
Glucose, Bld: 105 mg/dL — ABNORMAL HIGH (ref 70–99)
Potassium: 4.8 mmol/L (ref 3.5–5.1)
Sodium: 133 mmol/L — ABNORMAL LOW (ref 135–145)

## 2021-07-06 LAB — BRAIN NATRIURETIC PEPTIDE: B Natriuretic Peptide: 243.3 pg/mL — ABNORMAL HIGH (ref 0.0–100.0)

## 2021-07-06 MED ORDER — LOSARTAN POTASSIUM 25 MG PO TABS: 12.5000 mg | ORAL_TABLET | Freq: Every day | ORAL | 3 refills | Status: DC

## 2021-07-06 MED ORDER — TORSEMIDE 20 MG PO TABS: 10.0000 mg | ORAL_TABLET | Freq: Every day | ORAL | 5 refills | Status: DC

## 2021-07-06 NOTE — Patient Instructions (Signed)
It was a pleasure seeing you today! ? ?MEDICATIONS: ?-No medication changes today ?-We will call you with changes today after your labs result ?-Call if you have questions about your medications. ? ?LABS: ?-We will call you if your labs need attention. ? ?NEXT APPOINTMENT: ?Return to clinic in 08/11/2021 with Dr. Haroldine Laws. ? ?In general, to take care of your heart failure: ?-Limit your fluid intake to 2 Liters (half-gallon) per day.   ?-Limit your salt intake to ideally 2-3 grams (2000-3000 mg) per day. ?-Weigh yourself daily and record, and bring that "weight diary" to your next appointment.  (Weight gain of 2-3 pounds in 1 day typically means fluid weight.) ?-The medications for your heart are to help your heart and help you live longer.   ?-Please contact us before stopping any of your heart medications. ? ?Call the clinic at [phone] with questions or to reschedule future appointments.  ?

## 2021-07-06 NOTE — Progress Notes (Signed)
?Advanced Heart Failure Clinic Note  ? ?Primary Care: Albina Billet, MD ?Primary HF Cardiologist: Dr. Haroldine Laws ? ?HPI:  ?Anthony Weber is a 76 y.o. whose history includes combined systolic/diastolic HF, HTN, HLD, PVCs, and obesity. Had Echo 04/18/2020  LVEF 60-65% w/ G2DD and normal RV. ?  ?Admitted to [facility] 01/18/2021 with acute HFrEF. Echo EF 25-30% from previous EF 60-65%. R/LHC w/ no CAD, moderately elevated left heart and pulmonary artery pressures, severely elevated right heart filling pressures, severely reduced Fick cardiac output/index.  Required intropes, pressor, and Lasix gtt.  Transferred to [facility] for cardiogenic/septic shock, + MSSA bacteremia. Underwent TEE showing EF 25-30%, RV moderately down, severe biatrial enlargement and moderate MR, no valvular vegetation. Required addition of mexiletine to suppress PVCs. Hospitalization c/b AKI and hypoxic/hypercarbic respiratory failure. Drips gradually weaned off.  GDMT not pursued with hypotension. Placed on midodrine. Discharged SNF for IV antibiotics. D/C weight 291. Discharged 02/11/2021 ?  ?Discharged from [facility] 03/11/2021 after he completed IV antibiotics and rehab.  ?  ?Seen for f/u 03/2021. Volume stable. Midodrine decreased. Sleep study ordered.  ?  ?Presented to clinic on 05/14/2021 where he was overall feeling much better. He was working on being more active. He was taking all medications and working everyday at his [employer]. Jardiance 10 mg daily and spironolactone 12.5 mg daily were initiated and midodrine was discontinued.  ? ?Sleep study done on 05/16/2021 indicated no obstructive sleep apnea. However, a second study is planned for 07/2021.  ?  ?He returned to pharmacy clinic on 06/08/2021, where overall he was feeling much better. He did note dizziness 2-3 times a week when getting up or standing up for a long time, but this mostly happened when waking up. Denied lightheadedness, SOB, chest pain or palpitations and his fatigue had improved. He  continued to ride his stationary bike and walk and was able to complete all ADLs. His weight at home was 259 lbs, and his clinic weight was down 8 lbs from previous visit. He had been actively trying to lose weight. He was taking torsemide 40 mg daily. He had trace LEE which is normal for him. No PND/orthopnea. He was adherent to a low-salt diet and was using Mrs. Dash. His wife mentioned that she is concerned he is not eating enough protein. We discussed a heart healthy diet with lean meats, veggies, whole grains. ? ?Today he returns to HF clinic for pharmacist medication titration. At last visit with pharmacy clinic lab work revealed dry volume status and patient had an AKI. Torsemide  was held for a day and then reduced from 40 to 20 mg daily. Follow-up BMET indicated improved Scr. Overall he is feeling the same as last visit. He does have dizziness when he gets up mostly at night to use the bathroom. He states this happens about 3-4 times a week. He denies lightheadedness, chest pain, or palpitations and his level of fatigue is unchanged from last visit. He denies SOB, and is able to complete all ADLs. He continues to ride his stationary bike 30 minutes/day, do 20 minutes of resistance training/day, 20 minutes of walking per day and work at his car dealership. Both his home and clinic weight are down about 3 lbs respectively from last visit and he is taking torsemide 20 mg daily. He has trace LEE, wearing compression stockings which is consistent at his baseline. Denies, PND/orthopnea and sleeps on his side. He does complain of having to wake up to use the bathroom about 3-4  times/night. He is still actively trying to lose weight and adherent to a low-salt diet. He only use Mrs. Dash on occasion as he doesn't really like the taste.  ? ?HF Medications: ?Spironolactone 12.5 mg daily ?Jardiance 10 mg daily  ?Torsemide 20 mg daily ? ?Has the patient been experiencing any side effects to the medications prescribed?   NO ? ?Does the patient have any problems obtaining medications due to transportation or finances?   ?Has PAN grant for Jardiance to help with cost.  ? ?Understanding of regimen: fair ?Understanding of indications: fair ?Potential of compliance: good ?Patient understands to avoid NSAIDs. ?Patient understands to avoid decongestants. ?  ?Pertinent Lab Values: ?Labs 07/06/2021: Serum creatine 1.21, BUN 28, Potassium 4.8, Sodium 133, BNP 243.3 ?Labs 06/22/2021: Serum creatine 1.17, BUN 20, Potassium 5.0, Sodium 135 ? ?Vital Signs: ?Weight: 261.2 lbs (last clinic weight: 264 lbs) ?Blood pressure: 128/76 mmHg ?Heart rate: 65 bpm ? ?Assessment/Plan: ?1. Chronic Systolic Heart Failure  ?- Recent admit with cardiogenic/septic shock ?- Echo (01/2021): EF down from 55-->20-25%.  ?- R/LHC (01/2021): with no CAD, elevated filling pressures and low output heart failure.  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 Previously GDMT was limited by hypotension but now improved.  ?-Volume status euvolemic/dry on exam. BUN and Scr elevated slightly above baseline on labs today and weight is also down 3 lbs from last visit. Decrease torsemide to 10 mg daily.  ?- Start losartan 12.5 mg daily. Repeat BMET in 2 weeks. ?- Continue Jardiance 10 mg daily ?- Continue spironolactone 12.5 mg daily ?- Repeat ECHO in 1 month now that he has been on GDMT. If EF at that time remains < 35% will need referral for ICD.   ?-Changes discussed with patient via telephone call after visit. Patient verbalizes understanding. ?  ?2. NSVT, PVC  ?- Was in NSR, No PVCs in clinic 05/14/2021.  ?- Continue mexiletine 150 mg BID. ? ?3. MSSA bacteremia ?- Likely respiratory source ?- TEE (01/2021) - no vegetation  ?- Completed Cefazolin ?- Resolved ?  ?4. Chronic Hypoxic/Hypercarbia Respiratory Failure  ?- Resolved ?  ?5. Morbid Obesity  ?- Body mass index is 39.03 kg/m? ?- Continue current exercise  regimen ?  ?6. Suspected Sleep Apnea ?- Sleep study (05/2021) revealed no obstructive sleep apnea ?- Another sleep study is planned for 07/12/2021 ?  ?7. Physical Deconditioning ?- Completed HH ?- Doing great exercising at home ? ?Follow up 1 month with Dr Haroldine Laws and an ECHO.  ? ?Audry Riles, PharmD, BCPS, BCCP, CPP ?Heart Failure Clinic Pharmacist ?[phone] ?

## 2021-07-12 ENCOUNTER — Ambulatory Visit (HOSPITAL_BASED_OUTPATIENT_CLINIC_OR_DEPARTMENT_OTHER): Payer: Medicare HMO | Attending: Cardiology | Admitting: Cardiology

## 2021-07-12 DIAGNOSIS — I5021 Acute systolic (congestive) heart failure: Secondary | ICD-10-CM | POA: Insufficient documentation

## 2021-07-12 DIAGNOSIS — E669 Obesity, unspecified: Secondary | ICD-10-CM | POA: Diagnosis not present

## 2021-07-12 DIAGNOSIS — R0600 Dyspnea, unspecified: Secondary | ICD-10-CM | POA: Diagnosis not present

## 2021-07-12 DIAGNOSIS — Z6841 Body Mass Index (BMI) 40.0 and over, adult: Secondary | ICD-10-CM | POA: Diagnosis not present

## 2021-07-12 DIAGNOSIS — R0681 Apnea, not elsewhere classified: Secondary | ICD-10-CM | POA: Diagnosis not present

## 2021-07-14 ENCOUNTER — Encounter: Payer: Self-pay | Admitting: *Deleted

## 2021-07-14 NOTE — Telephone Encounter (Signed)
The patient has been notified of the result and verbalized understanding.  All questions (if any) were answered. ?Anthony Weber, [place] 07/14/2021 11:24 PM   ?Pt is aware and agreeable to normal results  ?

## 2021-07-14 NOTE — Telephone Encounter (Signed)
-----   Message from Lauralee Evener, [place] sent at 07/14/2021  8:44 AM EDT ----- ? ?----- Message ----- ?From: Sueanne Margarita, MD ?Sent: 07/14/2021   8:41 AM EDT ?To: Cv Div Sleep Studies ? ?Please let patient know that sleep study showed no significant sleep apnea.   ?  ? ?

## 2021-07-14 NOTE — Procedures (Signed)
? ?  Patient Name: [name], Anthony Weber ?Study Date:07/12/2021 ?Gender: Male ?D.O.B: [birth year] ?Age (years): 52 ?Referring Provider: Fransico Him MD, ABSM ?Height (inches): 67 ?Interpreting Physician: Fransico Him MD, ABSM ?Weight (lbs): 256 ?RPSGT: Baxter Flattery ?BMI: 40 ?MRN: [id number] ?Neck Size: 18.00 ? ?CLINICAL INFORMATION ?Sleep Study Type: NPSG ? ?Indication for sleep study: Obesity, Snoring, Witnesses Apnea / Gasping During Sleep ? ?Epworth Sleepiness Score: 1 ? ?SLEEP STUDY TECHNIQUE ?As per the AASM Manual for the Scoring of Sleep and Associated Events v2.3 (April 2016) with a hypopnea requiring 4% desaturations. ? ?The channels recorded and monitored were frontal, central and occipital EEG, electrooculogram (EOG), submentalis EMG (chin), nasal and oral airflow, thoracic and abdominal wall motion, anterior tibialis EMG, snore microphone, electrocardiogram, and pulse oximetry. ? ?MEDICATIONS ?Medications self-administered by patient taken the night of the study : MELATONIN ? ?SLEEP ARCHITECTURE ?The study was initiated at 10:12:12 PM and ended at 4:35:42 AM. ? ?Sleep onset time was 13.4 minutes and the sleep efficiency was 67.8%. The total sleep time was 260 minutes. ? ?Stage REM latency was 101.5 minutes. ? ?The patient spent 2.7% of the night in stage N1 sleep, 78.1% in stage N2 sleep, 0.0% in stage N3 and 19.2% in REM. ? ?Alpha intrusion was absent. ? ?Supine sleep was 0.00%. ? ?RESPIRATORY PARAMETERS ?The overall apnea/hypopnea index (AHI) was 1.4 per hour. There were 0 total apneas, including 0 obstructive, 0 central and 0 mixed apneas. There were 6 hypopneas and 0 RERAs. ? ?The AHI during Stage REM sleep was 3.6 per hour. ? ?AHI while supine was N/A per hour. ? ?The mean oxygen saturation was 93.5%. The minimum SpO2 during sleep was 89.0%. ? ?snoring was noted during this study. ? ?CARDIAC DATA ?The 2 lead EKG demonstrated sinus rhythm. The mean heart rate was 61.3 beats per minute. Other EKG findings  include: PVCs. ? ?LEG MOVEMENT DATA ?The total PLMS were 0 with a resulting PLMS index of 0.0. Associated arousal with leg movement index was 0.0 . ? ?IMPRESSIONS ?- No significant obstructive sleep apnea occurred during this study (AHI = 1.4/h). ?- The patient had minimal or no oxygen desaturation during the study (Min O2 = 89.0%) ?- No snoring was audible during this study. ?- EKG findings include  PVCs. ?- Clinically significant periodic limb movements did not occur during sleep. No significant associated arousals. ? ?DIAGNOSIS ?- Normal Study ? ?RECOMMENDATIONS ?- Avoid alcohol, sedatives and other CNS depressants that may worsen sleep apnea and disrupt normal sleep architecture. ?- Sleep hygiene should be reviewed to assess factors that may improve sleep quality. ?- Weight management and regular exercise should be initiated or continued if appropriate. ? ?[Electronically signed] 07/14/2021 08:39 AM ? ?Fransico Him MD, ABSM ?Diplomate, Tax adviser of Sleep Medicine ?

## 2021-07-20 ENCOUNTER — Ambulatory Visit (HOSPITAL_COMMUNITY)
Admission: RE | Admit: 2021-07-20 | Discharge: 2021-07-20 | Disposition: A | Discharge status: Home / Self Care | Payer: Medicare HMO | Source: Ambulatory Visit | Attending: Internal Medicine | Admitting: Internal Medicine

## 2021-07-20 DIAGNOSIS — I5043 Acute on chronic combined systolic (congestive) and diastolic (congestive) heart failure: Secondary | ICD-10-CM | POA: Diagnosis not present

## 2021-07-20 LAB — BASIC METABOLIC PANEL
Anion gap: 4 — ABNORMAL LOW (ref 5–15)
BUN: 25 mg/dL — ABNORMAL HIGH (ref 8–23)
CO2: 28 mmol/L (ref 22–32)
Calcium: 9.1 mg/dL (ref 8.9–10.3)
Chloride: 103 mmol/L (ref 98–111)
Creatinine, Ser: 1.24 mg/dL (ref 0.61–1.24)
GFR, Estimated: >60 mL/min (ref >60)
Glucose, Bld: 88 mg/dL (ref 70–99)
Potassium: 5.1 mmol/L (ref 3.5–5.1)
Sodium: 135 mmol/L (ref 135–145)

## 2021-07-24 ENCOUNTER — Telehealth: Payer: Self-pay | Admitting: *Deleted

## 2021-07-24 NOTE — Telephone Encounter (Signed)
The patient has been notified of the result and verbalized understanding.  All questions (if any) were answered. ?Marolyn Hammock, [place] 07/24/2021 11:43 PM   ?

## 2021-07-24 NOTE — Telephone Encounter (Signed)
-----   Message from Lauralee Evener, [place] sent at 07/14/2021  8:44 AM EDT ----- ? ?----- Message ----- ?From: Sueanne Margarita, MD ?Sent: 07/14/2021   8:41 AM EDT ?To: Cv Div Sleep Studies ? ?Please let patient know that sleep study showed no significant sleep apnea.   ?  ? ?

## 2021-07-28 NOTE — Telephone Encounter (Signed)
The encounter on 07/24/21 at 11:43 was created in error. Patient was called today and given his results. ?The patient has been notified of the result and verbalized understanding.  All questions (if any) were answered. ?Marolyn Hammock, [place] 07/28/2021 12:54 PM   ?Pt is aware and agreeable to normal results. ?

## 2021-07-28 NOTE — Telephone Encounter (Signed)
This encounter was created in error - please disregard.

## 2021-08-11 ENCOUNTER — Encounter (HOSPITAL_COMMUNITY): Payer: Self-pay | Admitting: Internal Medicine

## 2021-08-11 ENCOUNTER — Ambulatory Visit (HOSPITAL_COMMUNITY)
Admission: RE | Admit: 2021-08-11 | Discharge: 2021-08-11 | Disposition: A | Discharge status: Home / Self Care | Payer: Medicare HMO | Source: Ambulatory Visit | Attending: Internal Medicine | Admitting: Internal Medicine

## 2021-08-11 VITALS — BP 120/70 | HR 60 | Wt 262.0 lb

## 2021-08-11 DIAGNOSIS — I493 Ventricular premature depolarization: Secondary | ICD-10-CM | POA: Diagnosis not present

## 2021-08-11 DIAGNOSIS — I110 Hypertensive heart disease with heart failure: Secondary | ICD-10-CM | POA: Insufficient documentation

## 2021-08-11 DIAGNOSIS — Z6841 Body Mass Index (BMI) 40.0 and over, adult: Secondary | ICD-10-CM | POA: Insufficient documentation

## 2021-08-11 DIAGNOSIS — Z79899 Other long term (current) drug therapy: Secondary | ICD-10-CM | POA: Diagnosis not present

## 2021-08-11 DIAGNOSIS — Z7984 Long term (current) use of oral hypoglycemic drugs: Secondary | ICD-10-CM | POA: Insufficient documentation

## 2021-08-11 DIAGNOSIS — E785 Hyperlipidemia, unspecified: Secondary | ICD-10-CM | POA: Insufficient documentation

## 2021-08-11 DIAGNOSIS — I447 Left bundle-branch block, unspecified: Secondary | ICD-10-CM | POA: Insufficient documentation

## 2021-08-11 DIAGNOSIS — I5022 Chronic systolic (congestive) heart failure: Secondary | ICD-10-CM | POA: Diagnosis not present

## 2021-08-11 DIAGNOSIS — R6521 Severe sepsis with septic shock: Secondary | ICD-10-CM | POA: Insufficient documentation

## 2021-08-11 DIAGNOSIS — I472 Ventricular tachycardia, unspecified: Secondary | ICD-10-CM | POA: Diagnosis not present

## 2021-08-11 LAB — COMPREHENSIVE METABOLIC PANEL
ALT: 13 U/L (ref 0–44)
AST: 19 U/L (ref 15–41)
Albumin: 3.6 g/dL (ref 3.5–5.0)
Alkaline Phosphatase: 43 U/L (ref 38–126)
Anion gap: 6 (ref 5–15)
BUN: 23 mg/dL (ref 8–23)
CO2: 27 mmol/L (ref 22–32)
Calcium: 9 mg/dL (ref 8.9–10.3)
Chloride: 100 mmol/L (ref 98–111)
Creatinine, Ser: 1.19 mg/dL (ref 0.61–1.24)
GFR, Estimated: >60 mL/min (ref >60)
Glucose, Bld: 104 mg/dL — ABNORMAL HIGH (ref 70–99)
Potassium: 5.4 mmol/L — ABNORMAL HIGH (ref 3.5–5.1)
Sodium: 133 mmol/L — ABNORMAL LOW (ref 135–145)
Total Bilirubin: 0.8 mg/dL (ref 0.3–1.2)
Total Protein: 6.8 g/dL (ref 6.5–8.1)

## 2021-08-11 LAB — LIPID PANEL
Cholesterol: 137 mg/dL (ref 0–200)
HDL: 37 mg/dL — ABNORMAL LOW (ref >40)
LDL Cholesterol: 86 mg/dL (ref 0–99)
Total CHOL/HDL Ratio: 3.7 RATIO
Triglycerides: 69 mg/dL (ref <150)
VLDL: 14 mg/dL (ref 0–40)

## 2021-08-11 NOTE — Progress Notes (Signed)
? ?ADVANCED HF CLINIC CONSULT NOTE ? ?Primary Care: Albina Billet, MD ?Primary HF Cardiologist: Dr. Haroldine Laws ? ?HPI: ?Anthony Weber is a 76 y.o. with history includes combined systolic/diastolic HF, HTN, HLD , PVCs, and obesity. Had Echo 04/18/20  LVEF 60-65% w/ G2DD and normal RV. ?  ?Admitted to [facility] 10/22  acute HFrEF. Echo  EF 25-30% from previous EF 60-65%. R/LHC w/ no CAD, moderately elevated left heart and pulmonary artery pressures, severely elevated right heart filling pressures, severely reduced Fick cardiac output/index.  Required intropes, pressor, and lasix drip.  Transferred to [facility] for cardiogenic/septic shock, + MSSA bacteremia. Underwent TEE showing EF 25-30%, RV moderately down, severe biatrial enlargement and moderate Anthony, no valvular vegetation. Required addition of mexitiline to suppress PVCs. Hospitalization c/b AKI and hypoxic/hypercarbic respiratory failure. ? ?Discharged from [facility] 03/11/21 after he completed IV antibiotics and rehab.  ? ?At last visit midodrine stopped and GDMT started.  ? ?Sleep study 4/23 AHI 2 ? ?Here for routine f/u. Feels good. Here with his wife. Continues to do his exercises at home. Rides exercise bike for 56ms and walks for 20 min every day. Compliant with all meds. No SOB, orthopnea or PND. BP runs around 110 at home.  ? ?Cardiac Studies ?Echo 2/23 EF 30%  RV normal.  ?Echo 04/2020 EF 60-65%.  ? ?LHC/RHC 01/2021  ?no CAD  ?RA 20 ?PA 48/29 (35)  ?PCWP 25 ?CO 3.7 ?CI 1.4 ? ?Past Medical History:  ?Diagnosis Date  ? Depression   ? Hypertension   ? Hypothyroidism   ? ?Current Outpatient Medications  ?Medication Sig Dispense Refill  ? Ascorbic Acid (VITAMIN C) 1000 MG tablet Take 1,000 mg by mouth daily.    ? aspirin 325 MG tablet Take 325 mg by mouth daily.    ? Cholecalciferol (VITAMIN D3) 125 MCG (5000 UT) CAPS Take 1 capsule by mouth daily.    ? empagliflozin (JARDIANCE) 10 MG TABS tablet Take 1 tablet (10 mg total) by mouth daily before breakfast. 90 tablet 3  ? folic acid  (FOLVITE) 4694MCG tablet Take 400 mcg by mouth daily.    ? levothyroxine (SYNTHROID) 50 MCG tablet Take 50 mcg by mouth daily before breakfast.    ? losartan (COZAAR) 25 MG tablet Take 0.5 tablets (12.5 mg total) by mouth daily. 90 tablet 3  ? mexiletine (MEXITIL) 150 MG capsule Take 1 capsule (150 mg total) by mouth every 12 (twelve) hours. 60 capsule 0  ? pravastatin (PRAVACHOL) 20 MG tablet Take 1 tablet (20 mg total) by mouth daily. 90 tablet 3  ? spironolactone (ALDACTONE) 25 MG tablet Take 0.5 tablets (12.5 mg total) by mouth daily. 30 tablet 3  ? torsemide (DEMADEX) 20 MG tablet Take 0.5 tablets (10 mg total) by mouth daily. 30 tablet 5  ? vitamin B-12 (CYANOCOBALAMIN) 100 MCG tablet Take 100 mcg by mouth daily.    ? vitamin E 180 MG (400 UNITS) capsule Take 400 Units by mouth daily.    ? ?No current facility-administered medications for this encounter.  ? ?No Known Allergies ? ?Social History  ? ?Socioeconomic History  ? Marital status: Married  ?  Spouse name: BWilder Kurowski ? Number of children: Not on file  ? Years of education: Not on file  ? Highest education level: Not on file  ?Occupational History  ? Not on file  ?Tobacco Use  ? Smoking status: Former  ? Smokeless tobacco: Former  ?  Types: Chew  ?Vaping Use  ?  Vaping Use: Unknown  ?Substance and Sexual Activity  ? Alcohol use: No  ? Drug use: Not on file  ? Sexual activity: Not on file  ?Other Topics Concern  ? Not on file  ?Social History Narrative  ? Not on file  ? ?Social Determinants of Health  ? ?Financial Resource Strain: Not on file  ?Food Insecurity: Not on file  ?Transportation Needs: Not on file  ?Physical Activity: Not on file  ?Stress: Not on file  ?Social Connections: Not on file  ?Intimate Partner Violence: Not on file  ? ?No family history on file. ?Vitals:  ? 08/11/21 1052  ?BP: 120/70  ?Pulse: 60  ?SpO2: 97%  ? ?BP 120/70   Pulse 60   Wt 118.8 kg (262 lb)   SpO2 97%   BMI 41.04 kg/m?  ? ?Wt Readings from Last 3 Encounters:   ?08/11/21 118.8 kg (262 lb)  ?07/12/21 116.1 kg (256 lb)  ?07/06/21 118.5 kg (261 lb 3.2 oz)  ? ?PHYSICAL EXAM: ?General:  Well appearing. No resp difficulty ?HEENT: normal ?Neck: supple. no JVD. Carotids 2+ bilat; no bruits. No lymphadenopathy or thryomegaly appreciated. ?Cor: PMI nondisplaced. Regular rate & rhythm. No rubs, gallops or murmurs. ?Lungs: clear ?Abdomen: obese soft, nontender, nondistended. No hepatosplenomegaly. No bruits or masses. Good bowel sounds. ?Extremities: no cyanosis, clubbing, rash, edema ?Neuro: alert & orientedx3, cranial nerves grossly intact. moves all 4 extremities w/o difficulty. Affect pleasant ? ? ?ASSESSMENT & PLAN:  ? ?1. Chronic Systolic Heart Failure  ?- Recent admit with cardiogenic/septic shock ?- Echo (10/22): EF down from 55-->20-25%.  ?- R/LHC (10/22): with no CAD, elevated filling pressures and low output heart failure.  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 Sleep study 3/23 negative (AHI 2) ?- Echo 05/14/21 EF 30% RV normal  ?- Improved NYHA II ?- Volume status stable Continue torsemide to 10 mg daily  ?- Continue jardiance 10 mg daily ?- Continue spironolactone 12.5 mg daily ?- Continue losartan 12.5  ?- HR too low for b-blocker ?- Recent labs ok. K 5.1. Will recheck ?- will see back in 4 months with echo if EF < 35% will need to consider ICD ? ?2. NSVT, PVC  ?- In SR. PVCs suppressed ?- Well suppressed. Continue mexilitene 150 mg bid. ?- Sleep study negative 3/23 ?  ?3. Morbid Obesity  ?- Body mass index is 41.04 kg/m?. ?- discussed need for ongoing weight loss efforts with exercise and diet  ? ? ?Glori Bickers MD ?11:28 AM ? ? ? ? ?

## 2021-08-11 NOTE — Patient Instructions (Addendum)
Medication Changes: ? ?None--continue current regimen.  ? ?Lab Work: ? ?Labs done today, your results will be available in MyChart, we will contact you for abnormal readings. ? ? ?Testing/Procedures: ? ?Your physician has requested that you have an echocardiogram. Echocardiography is a painless test that uses sound waves to create images of your heart. It provides your doctor with information about the size and shape of your heart and how well your heart?s chambers and valves are working. This procedure takes approximately one hour. There are no restrictions for this procedure. Scheduled for 4 months.  ? ? ?Special Instructions // Education: ? ?Do the following things EVERYDAY: ?Weigh yourself in the morning before breakfast. Write it down and keep it in a log. ?Take your medicines as prescribed ?Eat low salt foods--Limit salt (sodium) to 2000 mg per day.  ?Stay as active as you can everyday ?Limit all fluids for the day to less than 2 liters ? ? ?Follow-Up in: 4 months ? ?At the [facility], you and your health needs are our priority. We have a designated team specialized in the treatment of Heart Failure. This Care Team includes your primary Heart Failure Specialized Cardiologist (physician), Advanced Practice Providers (APPs- Physician Assistants and Nurse Practitioners), and Pharmacist who all work together to provide you with the care you need, when you need it.  ? ?You may see any of the following providers on your designated Care Team at your next follow up: ? ?Dr Glori Bickers ?Dr Loralie Champagne ?Darrick Grinder, NP ?Lyda Jester, PA ?Jessica Milford,NP ?Marlyce Huge, PA ?Audry Riles, PharmD ? ? ?Please be sure to bring in all your medications bottles to every appointment.  ? ?Need to Contact us: ? ?If you have any questions or concerns before your next appointment please send [place] a message through [place] or call our office at [phone].   ? ?TO LEAVE A MESSAGE FOR THE NURSE SELECT  OPTION 2, PLEASE LEAVE A MESSAGE INCLUDING: ?YOUR NAME ?DATE OF BIRTH ?CALL BACK NUMBER ?REASON FOR CALL**this is important as we prioritize the call backs ? ?YOU WILL RECEIVE A CALL BACK THE SAME DAY AS LONG AS YOU CALL BEFORE 4:00 PM ? ? ?

## 2021-10-28 ENCOUNTER — Other Ambulatory Visit (HOSPITAL_COMMUNITY): Payer: Self-pay

## 2021-10-28 MED ORDER — SPIRONOLACTONE 25 MG PO TABS: 12.5000 mg | ORAL_TABLET | Freq: Every day | ORAL | 3 refills | Status: DC

## 2021-12-15 ENCOUNTER — Other Ambulatory Visit (HOSPITAL_COMMUNITY): Payer: Self-pay | Admitting: *Deleted

## 2021-12-15 DIAGNOSIS — I5022 Chronic systolic (congestive) heart failure: Secondary | ICD-10-CM

## 2021-12-17 ENCOUNTER — Telehealth (HOSPITAL_COMMUNITY): Payer: Self-pay | Admitting: *Deleted

## 2021-12-17 ENCOUNTER — Other Ambulatory Visit: Payer: Self-pay

## 2021-12-17 ENCOUNTER — Ambulatory Visit (HOSPITAL_BASED_OUTPATIENT_CLINIC_OR_DEPARTMENT_OTHER)
Admission: RE | Admit: 2021-12-17 | Discharge: 2021-12-17 | Disposition: A | Discharge status: Home / Self Care | Payer: Medicare HMO | Source: Ambulatory Visit | Attending: Internal Medicine | Admitting: Internal Medicine

## 2021-12-17 ENCOUNTER — Encounter (HOSPITAL_COMMUNITY): Payer: Self-pay | Admitting: Internal Medicine

## 2021-12-17 ENCOUNTER — Ambulatory Visit (HOSPITAL_COMMUNITY)
Admission: RE | Admit: 2021-12-17 | Discharge: 2021-12-17 | Disposition: A | Discharge status: Home / Self Care | Payer: Medicare HMO | Source: Ambulatory Visit | Attending: Internal Medicine | Admitting: Internal Medicine

## 2021-12-17 VITALS — BP 126/58 | HR 58 | Wt 257.0 lb

## 2021-12-17 DIAGNOSIS — I493 Ventricular premature depolarization: Secondary | ICD-10-CM | POA: Diagnosis not present

## 2021-12-17 DIAGNOSIS — I447 Left bundle-branch block, unspecified: Secondary | ICD-10-CM | POA: Insufficient documentation

## 2021-12-17 DIAGNOSIS — E785 Hyperlipidemia, unspecified: Secondary | ICD-10-CM | POA: Insufficient documentation

## 2021-12-17 DIAGNOSIS — I5022 Chronic systolic (congestive) heart failure: Secondary | ICD-10-CM

## 2021-12-17 DIAGNOSIS — Z79899 Other long term (current) drug therapy: Secondary | ICD-10-CM | POA: Insufficient documentation

## 2021-12-17 DIAGNOSIS — Z6841 Body Mass Index (BMI) 40.0 and over, adult: Secondary | ICD-10-CM | POA: Insufficient documentation

## 2021-12-17 DIAGNOSIS — I5042 Chronic combined systolic (congestive) and diastolic (congestive) heart failure: Secondary | ICD-10-CM | POA: Diagnosis not present

## 2021-12-17 DIAGNOSIS — I110 Hypertensive heart disease with heart failure: Secondary | ICD-10-CM | POA: Diagnosis present

## 2021-12-17 DIAGNOSIS — Z7984 Long term (current) use of oral hypoglycemic drugs: Secondary | ICD-10-CM | POA: Diagnosis not present

## 2021-12-17 DIAGNOSIS — R6521 Severe sepsis with septic shock: Secondary | ICD-10-CM | POA: Insufficient documentation

## 2021-12-17 DIAGNOSIS — I472 Ventricular tachycardia, unspecified: Secondary | ICD-10-CM

## 2021-12-17 LAB — BASIC METABOLIC PANEL
Anion gap: 7 (ref 5–15)
BUN: 21 mg/dL (ref 8–23)
CO2: 27 mmol/L (ref 22–32)
Calcium: 8.9 mg/dL (ref 8.9–10.3)
Chloride: 101 mmol/L (ref 98–111)
Creatinine, Ser: 1.1 mg/dL (ref 0.61–1.24)
GFR, Estimated: >60 mL/min (ref >60)
Glucose, Bld: 109 mg/dL — ABNORMAL HIGH (ref 70–99)
Potassium: 5.4 mmol/L — ABNORMAL HIGH (ref 3.5–5.1)
Sodium: 135 mmol/L (ref 135–145)

## 2021-12-17 LAB — ECHOCARDIOGRAM COMPLETE
Area-P 1/2: 3.31 cm2
Calc EF: 33.8 %
MV M vel: 4.85 m/s
MV Peak grad: 94.1 mmHg
Radius: 0.5 cm
S' Lateral: 5.6 cm
Single Plane A2C EF: 34.4 %
Single Plane A4C EF: 36.5 %

## 2021-12-17 LAB — BRAIN NATRIURETIC PEPTIDE: B Natriuretic Peptide: 206.2 pg/mL — ABNORMAL HIGH (ref 0.0–100.0)

## 2021-12-17 MED ORDER — PERFLUTREN LIPID MICROSPHERE
1.0000 mL | INTRAVENOUS | Status: DC | PRN
  Administered 2021-12-17: 2 mL via INTRAVENOUS

## 2021-12-17 NOTE — Telephone Encounter (Signed)
Called home number and spoke w/pt's wife, she is aware, agreeable, and verbalized understanding, pt will go to [facility] for labs in 2 weeks, order placed, med list updated

## 2021-12-17 NOTE — Patient Instructions (Signed)
Medication Changes:  none  Lab Work:  Labs done today, we will call you for abnormal results  Testing/Procedures:  Your physician has requested that you have an echocardiogram. Echocardiography is a painless test that uses sound waves to create images of your heart. It provides your doctor with information about the size and shape of your heart and how well your heart's chambers and valves are working. This procedure takes approximately one hour. There are no restrictions for this procedure. IN 4 MONTHS  Referrals:  none  Special Instructions // Education:  Do the following things EVERYDAY: Weigh yourself in the morning before breakfast. Write it down and keep it in a log. Take your medicines as prescribed Eat low salt foods--Limit salt (sodium) to 2000 mg per day.  Stay as active as you can everyday Limit all fluids for the day to less than 2 liters   Follow-Up in: 4 months with an echocardiogram (Jan 2024), **PLEASE CALL OUR OFFICE IN NOVEMBER TO SCHEDULE THESE APPOINTMENT  At the [facility], you and your health needs are our priority. We have a designated team specialized in the treatment of Heart Failure. This Care Team includes your primary Heart Failure Specialized Cardiologist (physician), Advanced Practice Providers (APPs- Physician Assistants and Nurse Practitioners), and Pharmacist who all work together to provide you with the care you need, when you need it.   You may see any of the following providers on your designated Care Team at your next follow up:  Dr. Glori Bickers Dr. Loralie Champagne Dr. Roxana Hires, NP Lyda Jester, [place] [facility] [place], [place] Forestine Na, NP Audry Riles, PharmD   Please be sure to bring in all your medications bottles to every appointment.   Need to Contact us:  If you have any questions or concerns before your next appointment please send [place] a message through [place] or call our  office at [phone].    TO LEAVE A MESSAGE FOR THE NURSE SELECT OPTION 2, PLEASE LEAVE A MESSAGE INCLUDING: YOUR NAME DATE OF BIRTH CALL BACK NUMBER REASON FOR CALL**this is important as we prioritize the call backs  YOU WILL RECEIVE A CALL BACK THE SAME DAY AS LONG AS YOU CALL BEFORE 4:00 PM

## 2021-12-17 NOTE — Progress Notes (Signed)
Echocardiogram 2D Echocardiogram has been performed.  Anthony Weber 12/17/2021, 11:03 AM

## 2021-12-17 NOTE — Progress Notes (Signed)
ADVANCED HF CLINIC CONSULT NOTE  Primary Care: Albina Billet, MD Primary HF Cardiologist: Dr. Haroldine Laws  HPI: [name] is a 76 y.o. with history includes combined systolic/diastolic HF, HTN, HLD , PVCs, and obesity. Had Echo 04/18/20  LVEF 60-65% w/ G2DD and normal RV.   Admitted to [facility] 10/22  acute HFrEF. Echo  EF 25-30% from previous EF 60-65%. R/LHC w/ no CAD, moderately elevated left heart and pulmonary artery pressures, severely elevated right heart filling pressures, severely reduced Fick cardiac output/index.  Required intropes, pressor, and lasix drip.  Transferred to [facility] for cardiogenic/septic shock, + MSSA bacteremia. Underwent TEE showing EF 25-30%, RV moderately down, severe biatrial enlargement and moderate MR, no valvular vegetation. Required addition of mexitiline to suppress PVCs. Hospitalization c/b AKI and hypoxic/hypercarbic respiratory failure.  Discharged from [facility] 03/11/21 after he completed IV antibiotics and rehab.   At last visit midodrine stopped and GDMT started.   Sleep study 4/23 AHI 2  Here for routine f/u. Feels good. Here with his wife. Feels better. Working 6 days per week. Mild DOE. No edema, orthopnea or PND. Riding exercise bike and walking driveway.   Echo today 12/17/21 EF 35-40% moderate MR. Personally reviewed  Cardiac Studies Echo 2/23 EF 30%  RV normal.  Echo 04/2020 EF 60-65%.   LHC/RHC 01/2021  no CAD  RA 20 PA 48/29 (35)  PCWP 25 CO 3.7 CI 1.4  Past Medical History:  Diagnosis Date   Depression    Hypertension    Hypothyroidism    Current Outpatient Medications  Medication Sig Dispense Refill   Ascorbic Acid (VITAMIN C) 1000 MG tablet Take 1,000 mg by mouth daily.     aspirin 325 MG tablet Take 325 mg by mouth daily.     Cholecalciferol (VITAMIN D3) 125 MCG (5000 UT) CAPS Take 1 capsule by mouth daily.     empagliflozin (JARDIANCE) 10 MG TABS tablet Take 1 tablet (10 mg total) by mouth daily before breakfast. 90 tablet 3   folic  acid (FOLVITE) 767 MCG tablet Take 400 mcg by mouth daily.     levothyroxine (SYNTHROID) 75 MCG tablet Take 75 mcg by mouth daily before breakfast.     losartan (COZAAR) 25 MG tablet Take 0.5 tablets (12.5 mg total) by mouth daily. 90 tablet 3   mexiletine (MEXITIL) 150 MG capsule Take 1 capsule (150 mg total) by mouth every 12 (twelve) hours. 60 capsule 0   pravastatin (PRAVACHOL) 20 MG tablet Take 1 tablet (20 mg total) by mouth daily. 90 tablet 3   spironolactone (ALDACTONE) 25 MG tablet Take 0.5 tablets (12.5 mg total) by mouth daily. 30 tablet 3   torsemide (DEMADEX) 20 MG tablet Take 0.5 tablets (10 mg total) by mouth daily. 30 tablet 5   vitamin B-12 (CYANOCOBALAMIN) 100 MCG tablet Take 100 mcg by mouth daily.     vitamin E 180 MG (400 UNITS) capsule Take 400 Units by mouth daily.     No current facility-administered medications for this encounter.   Facility-Administered Medications Ordered in Other Encounters  Medication Dose Route Frequency Provider Last Rate Last Admin   perflutren lipid microspheres (DEFINITY) IV suspension  1-10 mL Intravenous PRN Marri Mcneff, Shaune Pascal, MD   2 mL at 12/17/21 1104   No Known Allergies  Social History   Socioeconomic History   Marital status: Married    Spouse name: Michaela Shankel   Number of children: Not on file   Years of education: Not on file  Highest education level: Not on file  Occupational History   Not on file  Tobacco Use   Smoking status: Former   Smokeless tobacco: Former    Types: Nurse, children's Use: Unknown  Substance and Sexual Activity   Alcohol use: No   Drug use: Not on file   Sexual activity: Not on file  Other Topics Concern   Not on file  Social History Narrative   Not on file   Social Determinants of Health   Financial Resource Strain: Not on file  Food Insecurity: Not on file  Transportation Needs: Not on file  Physical Activity: Not on file  Stress: Not on file  Social Connections: Not on  file  Intimate Partner Violence: Not on file   No family history on file. Vitals:   12/17/21 1106  BP: (!) 126/58  Pulse: (!) 58  SpO2: 100%   BP (!) 126/58   Pulse (!) 58   Wt 116.6 kg (257 lb)   SpO2 100%   BMI 40.25 kg/m   Wt Readings from Last 3 Encounters:  12/17/21 116.6 kg (257 lb)  08/11/21 118.8 kg (262 lb)  07/12/21 116.1 kg (256 lb)   PHYSICAL EXAM: General:  Well appearing. No resp difficulty HEENT: normal Neck: supple. no JVD. Carotids 2+ bilat; no bruits. No lymphadenopathy or thryomegaly appreciated. Cor: PMI nondisplaced. Regular rate & rhythm. No rubs, gallops or murmurs. Lungs: clear Abdomen: obese  soft, nontender, nondistended. No hepatosplenomegaly. No bruits or masses. Good bowel sounds. Extremities: no cyanosis, clubbing, rash, edema Neuro: alert & orientedx3, cranial nerves grossly intact. moves all 4 extremities w/o difficulty. Affect pleasant   ASSESSMENT & PLAN:   1. Chronic Systolic Heart Failure  - Recent admit with cardiogenic/septic shock - Echo (10/22): EF down from 55-->20-25%.  - R/LHC (10/22): with no CAD, elevated filling pressures and low output heart failure.  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 Sleep study 3/23 negative (AHI 2) - Echo 05/14/21 EF 30% RV normal  - Echo today 12/17/21 EF 35-40% moderate MR. Personally reviewed - Improved NYHA II - Volume status stable Continue torsemide to 10 mg daily  - Continue jardiance 10 mg daily - Continue spironolactone 12.5 mg daily - Continue losartan 12.5  - HR too low for b-blocker - Recent labs ok. K 5.1. Will recheck - may need to hold spiro. Discussed low potassium diet.  - Hopefully EF continues to improve with PVC suppression   2. NSVT, PVC  - In SR. PVCs suppressed - Continue mexilitene 150 mg bid. - Sleep study negative 3/23 - ECG today   3. Morbid Obesity  - Body mass index is 40.25 kg/m. - Continue weight loss  efforts  Glori Bickers MD 11:28 AM

## 2021-12-17 NOTE — Telephone Encounter (Signed)
-----   Message from Jolaine Artist, MD sent at 12/17/2021  2:56 PM EDT ----- Stop spiro. Repeat BMET 2 weeks

## 2021-12-31 ENCOUNTER — Other Ambulatory Visit
Admission: RE | Admit: 2021-12-31 | Discharge: 2021-12-31 | Disposition: A | Discharge status: Home / Self Care | Payer: Medicare HMO | Attending: Internal Medicine | Admitting: Internal Medicine

## 2021-12-31 DIAGNOSIS — I5042 Chronic combined systolic (congestive) and diastolic (congestive) heart failure: Secondary | ICD-10-CM | POA: Diagnosis present

## 2021-12-31 LAB — BASIC METABOLIC PANEL
Anion gap: 7 (ref 5–15)
BUN: 21 mg/dL (ref 8–23)
CO2: 25 mmol/L (ref 22–32)
Calcium: 8.7 mg/dL — ABNORMAL LOW (ref 8.9–10.3)
Chloride: 103 mmol/L (ref 98–111)
Creatinine, Ser: 1 mg/dL (ref 0.61–1.24)
GFR, Estimated: >60 mL/min (ref >60)
Glucose, Bld: 107 mg/dL — ABNORMAL HIGH (ref 70–99)
Potassium: 4.5 mmol/L (ref 3.5–5.1)
Sodium: 135 mmol/L (ref 135–145)

## 2022-02-25 ENCOUNTER — Other Ambulatory Visit (HOSPITAL_COMMUNITY): Payer: Self-pay | Admitting: Cardiology

## 2022-04-28 ENCOUNTER — Other Ambulatory Visit (HOSPITAL_COMMUNITY): Payer: Self-pay | Admitting: Cardiology

## 2022-04-28 MED ORDER — EMPAGLIFLOZIN 10 MG PO TABS: 10.0000 mg | ORAL_TABLET | Freq: Every day | ORAL | 3 refills | Status: DC

## 2022-05-26 ENCOUNTER — Other Ambulatory Visit (HOSPITAL_COMMUNITY): Payer: Self-pay | Admitting: Cardiology

## 2022-05-26 DIAGNOSIS — I5043 Acute on chronic combined systolic (congestive) and diastolic (congestive) heart failure: Secondary | ICD-10-CM

## 2022-07-03 LAB — LAB REPORT - SCANNED: EGFR: 71

## 2022-07-27 ENCOUNTER — Ambulatory Visit (HOSPITAL_COMMUNITY)
Admission: RE | Admit: 2022-07-27 | Discharge: 2022-07-27 | Disposition: A | Discharge status: Home / Self Care | Payer: Medicare HMO | Source: Ambulatory Visit | Attending: Internal Medicine | Admitting: Internal Medicine

## 2022-07-27 ENCOUNTER — Ambulatory Visit (HOSPITAL_BASED_OUTPATIENT_CLINIC_OR_DEPARTMENT_OTHER)
Admission: RE | Admit: 2022-07-27 | Discharge: 2022-07-27 | Disposition: A | Discharge status: Home / Self Care | Payer: Medicare HMO | Source: Ambulatory Visit | Attending: Internal Medicine | Admitting: Internal Medicine

## 2022-07-27 ENCOUNTER — Encounter (HOSPITAL_COMMUNITY): Payer: Self-pay | Admitting: Internal Medicine

## 2022-07-27 ENCOUNTER — Other Ambulatory Visit (HOSPITAL_COMMUNITY): Payer: Self-pay | Admitting: Internal Medicine

## 2022-07-27 ENCOUNTER — Inpatient Hospital Stay (HOSPITAL_COMMUNITY)
Admission: RE | Admit: 2022-07-27 | Discharge: 2022-07-27 | Disposition: A | Discharge status: Home / Self Care | Payer: Medicare HMO | Source: Ambulatory Visit | Attending: Internal Medicine | Admitting: Internal Medicine

## 2022-07-27 VITALS — BP 130/72 | HR 65 | Wt 252.2 lb

## 2022-07-27 DIAGNOSIS — Z87891 Personal history of nicotine dependence: Secondary | ICD-10-CM | POA: Insufficient documentation

## 2022-07-27 DIAGNOSIS — Z8619 Personal history of other infectious and parasitic diseases: Secondary | ICD-10-CM | POA: Insufficient documentation

## 2022-07-27 DIAGNOSIS — E785 Hyperlipidemia, unspecified: Secondary | ICD-10-CM | POA: Insufficient documentation

## 2022-07-27 DIAGNOSIS — Z79899 Other long term (current) drug therapy: Secondary | ICD-10-CM | POA: Insufficient documentation

## 2022-07-27 DIAGNOSIS — Z6839 Body mass index (BMI) 39.0-39.9, adult: Secondary | ICD-10-CM | POA: Insufficient documentation

## 2022-07-27 DIAGNOSIS — I5022 Chronic systolic (congestive) heart failure: Secondary | ICD-10-CM | POA: Insufficient documentation

## 2022-07-27 DIAGNOSIS — I472 Ventricular tachycardia, unspecified: Secondary | ICD-10-CM | POA: Insufficient documentation

## 2022-07-27 DIAGNOSIS — I340 Nonrheumatic mitral (valve) insufficiency: Secondary | ICD-10-CM | POA: Diagnosis not present

## 2022-07-27 DIAGNOSIS — I493 Ventricular premature depolarization: Secondary | ICD-10-CM

## 2022-07-27 DIAGNOSIS — I5042 Chronic combined systolic (congestive) and diastolic (congestive) heart failure: Secondary | ICD-10-CM | POA: Diagnosis not present

## 2022-07-27 DIAGNOSIS — I447 Left bundle-branch block, unspecified: Secondary | ICD-10-CM | POA: Insufficient documentation

## 2022-07-27 DIAGNOSIS — R2689 Other abnormalities of gait and mobility: Secondary | ICD-10-CM | POA: Diagnosis not present

## 2022-07-27 DIAGNOSIS — I110 Hypertensive heart disease with heart failure: Secondary | ICD-10-CM | POA: Insufficient documentation

## 2022-07-27 LAB — BASIC METABOLIC PANEL
Anion gap: 7 (ref 5–15)
BUN: 15 mg/dL (ref 8–23)
CO2: 25 mmol/L (ref 22–32)
Calcium: 8.7 mg/dL — ABNORMAL LOW (ref 8.9–10.3)
Chloride: 101 mmol/L (ref 98–111)
Creatinine, Ser: 1.07 mg/dL (ref 0.61–1.24)
GFR, Estimated: >60 mL/min (ref >60)
Glucose, Bld: 98 mg/dL (ref 70–99)
Potassium: 4.5 mmol/L (ref 3.5–5.1)
Sodium: 133 mmol/L — ABNORMAL LOW (ref 135–145)

## 2022-07-27 LAB — BRAIN NATRIURETIC PEPTIDE: B Natriuretic Peptide: 444 pg/mL — ABNORMAL HIGH (ref 0.0–100.0)

## 2022-07-27 LAB — ECHOCARDIOGRAM COMPLETE
AR max vel: 4.24 cm2
AV Area VTI: 4.14 cm2
AV Area mean vel: 4.03 cm2
AV Mean grad: 2 mmHg
AV Peak grad: 4.4 mmHg
Ao pk vel: 1.05 m/s
Area-P 1/2: 4.36 cm2
MV M vel: 4.27 m/s
MV Peak grad: 72.9 mmHg
Radius: 0.5 cm
S' Lateral: 4.6 cm

## 2022-07-27 MED ORDER — PERFLUTREN LIPID MICROSPHERE
1.0000 mL | INTRAVENOUS | Status: DC | PRN
  Administered 2022-07-27: 2 mL via INTRAVENOUS

## 2022-07-27 NOTE — Progress Notes (Signed)
ADVANCED HF CLINIC CONSULT NOTE  Primary Care: Jaclyn Shaggy, MD Primary HF Cardiologist: Dr. Gala Romney  HPI: Anthony Weber is a 77 y.o. with history includes combined systolic/diastolic HF, HTN, HLD , PVCs, and obesity. Had Echo 04/18/20  LVEF 60-65% w/ G2DD and normal RV.   Admitted to [facility] 10/22  acute HFrEF. Echo  EF 25-30% from previous EF 60-65%. R/LHC w/ no CAD, moderately elevated left heart and pulmonary artery pressures, severely elevated right heart filling pressures, severely reduced Fick cardiac output/index.  Required intropes, pressor, and lasix drip.  Transferred to [facility] for cardiogenic/septic shock, + MSSA bacteremia. Underwent TEE showing EF 25-30%, RV moderately down, severe biatrial enlargement and moderate Anthony, no valvular vegetation. Required addition of mexitiline to suppress PVCs. Hospitalization c/b AKI and hypoxic/hypercarbic respiratory failure.  Discharged from [facility] 03/11/21 after he completed IV antibiotics and rehab.   At last visit midodrine stopped and GDMT started.   Sleep study 4/23 AHI 2  Here for routine f/u. Here for f/u with his wife. Says he is doing pretty good. 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 Walking 1 mile/day. Main complaint is poor balance. No edema, orthopnea or PND. Has had 3 UTIs.   Echo  12/17/21 EF 35-40% moderate Anthony.   Echo 07/27/22 EF 30-35% moderate Anthony  Cardiac Studies Echo 2/23 EF 30%  RV normal.  Echo 04/2020 EF 60-65%.   LHC/RHC 01/2021  no CAD  RA 20 PA 48/29 (35)  PCWP 25 CO 3.7 CI 1.4  Past Medical History:  Diagnosis Date   Depression    Hypertension    Hypothyroidism    Current Outpatient Medications  Medication Sig Dispense Refill   Ascorbic Acid (VITAMIN C) 1000 MG tablet Take 1,000 mg by mouth daily.     aspirin 325 MG tablet Take 325 mg by mouth daily.     Cholecalciferol (VITAMIN D3) 125 MCG (5000 UT) CAPS Take 1 capsule by mouth daily.     empagliflozin (JARDIANCE) 10 MG TABS tablet Take 1 tablet (10 mg  total) by mouth daily before breakfast. 90 tablet 3   folic acid (FOLVITE) 400 MCG tablet Take 400 mcg by mouth daily.     levothyroxine (SYNTHROID) 75 MCG tablet Take 75 mcg by mouth daily before breakfast.     losartan (COZAAR) 25 MG tablet TAKE ONE-HALF TABLET BY MOUTH ONE TIME DAILY 90 tablet 3   mexiletine (MEXITIL) 150 MG capsule Take 1 capsule (150 mg total) by mouth every 12 (twelve) hours. 60 capsule 0   pravastatin (PRAVACHOL) 20 MG tablet TAKE ONE TABLET BY MOUTH ONE TIME DAILY 90 tablet 3   torsemide (DEMADEX) 20 MG tablet Take 0.5 tablets (10 mg total) by mouth daily. 30 tablet 5   vitamin B-12 (CYANOCOBALAMIN) 100 MCG tablet Take 100 mcg by mouth daily.     vitamin E 180 MG (400 UNITS) capsule Take 400 Units by mouth daily.     No current facility-administered medications for this encounter.   Facility-Administered Medications Ordered in Other Encounters  Medication Dose Route Frequency Provider Last Rate Last Admin   perflutren lipid microspheres (DEFINITY) IV suspension  1-10 mL Intravenous PRN Nyriah Coote, Bevelyn Buckles, MD   2 mL at 07/27/22 1458   No Known Allergies  Social History   Socioeconomic History   Marital status: Married    Spouse name: Khayden Herzberg   Number of children: Not on file   Years of education: Not on file   Highest education level:  Not on file  Occupational History   Not on file  Tobacco Use   Smoking status: Former   Smokeless tobacco: Former    Types: Associate Professor Use: Unknown  Substance and Sexual Activity   Alcohol use: No   Drug use: Not on file   Sexual activity: Not on file  Other Topics Concern   Not on file  Social History Narrative   Not on file   Social Determinants of Health   Financial Resource Strain: Not on file  Food Insecurity: Not on file  Transportation Needs: Not on file  Physical Activity: Not on file  Stress: Not on file  Social Connections: Not on file  Intimate Partner Violence: Not on file    History reviewed. No pertinent family history. Vitals:   07/27/22 1459  BP: 130/72  Pulse: 65  SpO2: 97%    BP 130/72   Pulse 65   Wt 114.4 kg (252 lb 3.2 oz)   SpO2 97%   BMI 39.50 kg/m   Wt Readings from Last 3 Encounters:  07/27/22 114.4 kg (252 lb 3.2 oz)  12/17/21 116.6 kg (257 lb)  08/11/21 118.8 kg (262 lb)   PHYSICAL EXAM: General: Obese male No resp difficulty HEENT: normal Neck: supple. no JVD. Carotids 2+ bilat; no bruits. No lymphadenopathy or thryomegaly appreciated. Cor: PMI nondisplaced. Regular rate & rhythm. No rubs, gallops or murmurs. Lungs: clear Abdomen: obese soft, nontender, nondistended. No hepatosplenomegaly. No bruits or masses. Good bowel sounds. Extremities: no cyanosis, clubbing, rash, trace edema Neuro: alert & orientedx3, cranial nerves grossly intact. moves all 4 extremities w/o difficulty. Affect pleasant  ECG: NSR 63 LBBB Personally reviewed  ASSESSMENT & PLAN:   1. Chronic Systolic Heart Failure  - Recent admit with cardiogenic/septic shock - Echo (10/22): EF down from 55-->20-25%.  - R/LHC (10/22): with no CAD, elevated filling pressures and low output heart failure.  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 Sleep study 3/23 negative (AHI 2) - Echo 05/14/21 EF 30% RV normal  - Echo 12/17/21 EF 35-40% moderate Anthony.  - Echo today 07/27/22: EF 30-35% moderate Anthony - Stable NYHA II-III. Volume status looks good  -  Continue torsemide to 10 mg daily  - Continue jardiance 10 mg daily - Continue spironolactone 12.5 mg daily - Continue losartan 12.5  - HR too low for b-blocker - EF essentially unchanged despite PVC suppression. Will check cMRI and rpeat Zio to make sure PVCs full suppressed - If EF < 35% will need to condier ICD but may defer with age and comorbidities. If sugnificant dyssynchrony on cMRi can consdier CRT with LBBB  2. NSVT, PVC  - In SR. PVCs suppressed -  Continue mexilitene 150 mg bid. - Sleep study negative 3/23 - With lack of improvement in EF will repeat Zip to reassess PVC burden   3. Morbid Obesity  - Body mass index is 39.5 kg/m. - Continue weight loss efforts  4. LBBB - plan as above   Arvilla Meres MD 3:30 PM

## 2022-07-27 NOTE — Progress Notes (Signed)
Zio patch placed onto patient.  All instructions and information reviewed with patient, they verbalize understanding with no questions. 

## 2022-07-27 NOTE — Patient Instructions (Signed)
Medication Changes:  None, continue current medications  Lab Work:  Labs done today, your results will be available in MyChart, we will contact you for abnormal readings.  Testing/Procedures:  Your provider has recommended that  you wear a Zio Patch for 7 days.  This monitor will record your heart rhythm for our review.  IF you have any symptoms while wearing the monitor please press the button.  If you have any issues with the patch or you notice a red or orange light on it please call the company at [phone].  Once you remove the patch please mail it back to the company as soon as possible so we can get the results.  Your physician has requested that you have a cardiac MRI. Cardiac MRI uses a computer to create images of your heart as its beating, producing both still and moving pictures of your heart and major blood vessels. For further information please visit [email or web address]. Please follow the instruction sheet given to you today for more information.  Referrals:  none  Special Instructions // Education:  Do the following things EVERYDAY: Weigh yourself in the morning before breakfast. Write it down and keep it in a log. Take your medicines as prescribed Eat low salt foods--Limit salt (sodium) to 2000 mg per day.  Stay as active as you can everyday Limit all fluids for the day to less than 2 liters   Follow-Up in: 3 months at [facility], they will call you closer to that time to schedule  At the [facility], you and your health needs are our priority. We have a designated team specialized in the treatment of Heart Failure. This Care Team includes your primary Heart Failure Specialized Cardiologist (physician), Advanced Practice Providers (APPs- Physician Assistants and Nurse Practitioners), and Pharmacist who all work together to provide you with the care you need, when you need it.   You may see any of the following providers on your designated  Care Team at your next follow up:  Dr. Arvilla Meres Dr. Marca Ancona Dr. Marcos Eke, NP Robbie Lis, [place] [facility], [place] Brynda Peon, NP Karle Plumber, PharmD   Please be sure to bring in all your medications bottles to every appointment.   Need to Contact us:  If you have any questions or concerns before your next appointment please send [place] a message through [place] or call our office at [phone].    TO LEAVE A MESSAGE FOR THE NURSE SELECT OPTION 2, PLEASE LEAVE A MESSAGE INCLUDING: YOUR NAME DATE OF BIRTH CALL BACK NUMBER REASON FOR CALL**this is important as we prioritize the call backs  YOU WILL RECEIVE A CALL BACK THE SAME DAY AS LONG AS YOU CALL BEFORE 4:00 PM

## 2022-07-28 ENCOUNTER — Telehealth: Payer: Self-pay | Admitting: *Deleted

## 2022-07-28 NOTE — Telephone Encounter (Signed)
Cardiac MRI auth approved   Approved Authorization #[id number]  Tracking #[id number]  Expires 09/01/22

## 2022-08-18 ENCOUNTER — Encounter (HOSPITAL_COMMUNITY): Payer: Self-pay | Admitting: Internal Medicine

## 2022-08-18 ENCOUNTER — Ambulatory Visit
Admission: RE | Admit: 2022-08-18 | Discharge: 2022-08-18 | Disposition: A | Discharge status: Home / Self Care | Payer: Medicare HMO | Source: Ambulatory Visit | Attending: Internal Medicine | Admitting: Internal Medicine

## 2022-08-18 ENCOUNTER — Other Ambulatory Visit (HOSPITAL_COMMUNITY): Payer: Self-pay | Admitting: Internal Medicine

## 2022-08-18 DIAGNOSIS — I5022 Chronic systolic (congestive) heart failure: Secondary | ICD-10-CM | POA: Insufficient documentation

## 2022-08-18 MED ORDER — GADOBUTROL 1 MMOL/ML IV SOLN
14.0000 mL | Freq: Once | INTRAVENOUS | Status: AC | PRN
  Administered 2022-08-18: 14 mL via INTRAVENOUS

## 2022-09-07 ENCOUNTER — Other Ambulatory Visit (HOSPITAL_COMMUNITY): Payer: Self-pay

## 2022-09-07 DIAGNOSIS — I5043 Acute on chronic combined systolic (congestive) and diastolic (congestive) heart failure: Secondary | ICD-10-CM

## 2022-09-07 MED ORDER — TORSEMIDE 20 MG PO TABS: 10.0000 mg | ORAL_TABLET | Freq: Every day | ORAL | 3 refills | Status: DC

## 2022-09-29 ENCOUNTER — Ambulatory Visit (HOSPITAL_BASED_OUTPATIENT_CLINIC_OR_DEPARTMENT_OTHER): Payer: Medicare HMO | Admitting: Internal Medicine

## 2022-09-29 ENCOUNTER — Other Ambulatory Visit
Admission: RE | Admit: 2022-09-29 | Discharge: 2022-09-29 | Disposition: A | Discharge status: Home / Self Care | Payer: Medicare HMO | Source: Ambulatory Visit | Attending: Internal Medicine | Admitting: Internal Medicine

## 2022-09-29 ENCOUNTER — Encounter: Payer: Self-pay | Admitting: Internal Medicine

## 2022-09-29 VITALS — BP 122/67 | HR 65 | Wt 251.2 lb

## 2022-09-29 DIAGNOSIS — I5022 Chronic systolic (congestive) heart failure: Secondary | ICD-10-CM | POA: Insufficient documentation

## 2022-09-29 DIAGNOSIS — I493 Ventricular premature depolarization: Secondary | ICD-10-CM | POA: Diagnosis not present

## 2022-09-29 LAB — BASIC METABOLIC PANEL
Anion gap: 10 (ref 5–15)
BUN: 16 mg/dL (ref 8–23)
CO2: 22 mmol/L (ref 22–32)
Calcium: 8.5 mg/dL — ABNORMAL LOW (ref 8.9–10.3)
Chloride: 103 mmol/L (ref 98–111)
Creatinine, Ser: 0.93 mg/dL (ref 0.61–1.24)
GFR, Estimated: >60 mL/min (ref >60)
Glucose, Bld: 95 mg/dL (ref 70–99)
Potassium: 4.4 mmol/L (ref 3.5–5.1)
Sodium: 135 mmol/L (ref 135–145)

## 2022-09-29 LAB — BRAIN NATRIURETIC PEPTIDE: B Natriuretic Peptide: 391.7 pg/mL — ABNORMAL HIGH (ref 0.0–100.0)

## 2022-09-29 MED ORDER — METOPROLOL SUCCINATE ER 25 MG PO TB24: 25.0000 mg | ORAL_TABLET | Freq: Every day | ORAL | 1 refills | Status: DC

## 2022-09-29 NOTE — Progress Notes (Signed)
ADVANCED HF CLINIC  NOTE  Primary Care: Jaclyn Shaggy, MD Primary HF Cardiologist: Dr. Gala Romney  HPI: [name] is a 77 y.o. with history includes combined systolic/diastolic HF, HTN, HLD , PVCs, and obesity. Had Echo 04/18/20  LVEF 60-65% w/ G2DD and normal RV.   Admitted to [facility] 10/22  acute HFrEF. Echo  EF 25-30% from previous EF 60-65%. R/LHC w/ no CAD, moderately elevated left heart and pulmonary artery pressures, severely elevated right heart filling pressures, severely reduced Fick cardiac output/index.  Required intropes, pressor, and lasix drip.  Transferred to [facility] for cardiogenic/septic shock, + MSSA bacteremia. Underwent TEE showing EF 25-30%, RV moderately down, severe biatrial enlargement and moderate MR, no valvular vegetation. Required addition of mexitiline to suppress PVCs. Hospitalization c/b AKI and hypoxic/hypercarbic respiratory failure.  Discharged from [facility] 03/11/21 after he completed IV antibiotics and rehab.   At last visit midodrine stopped and GDMT started.   Sleep study 4/23 AHI 2  Here for routine f/u. Here for f/u with his wife. Says he is doing pretty good. 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 Walking 1 mile/day or rides stationary bike. Breathing ok. Gets SOB if he pushes it or it is hot.Edema well controlled.  cMRI 5/24 EF 31% no LGE or infiltrative process. RVEF 43%  Zio 4/24 SR - average rate 66. Nine runs NSVT  3.1% PVCs  Echo  12/17/21 EF 35-40% moderate MR.   Echo 07/27/22 EF 30-35% moderate MR  Cardiac Studies Echo 2/23 EF 30%  RV normal.  Echo 04/2020 EF 60-65%.   LHC/RHC 01/2021  no CAD  RA 20 PA 48/29 (35)  PCWP 25 CO 3.7 CI 1.4  Past Medical History:  Diagnosis Date   Depression    Hypertension    Hypothyroidism    Current Outpatient Medications  Medication Sig Dispense Refill   Ascorbic Acid (VITAMIN C) 1000 MG tablet Take 1,000 mg by mouth daily.     aspirin 325 MG tablet Take 325 mg by mouth daily.     Cholecalciferol  (VITAMIN D3) 125 MCG (5000 UT) CAPS Take 1 capsule by mouth daily.     empagliflozin (JARDIANCE) 10 MG TABS tablet Take 1 tablet (10 mg total) by mouth daily before breakfast. 90 tablet 3   folic acid (FOLVITE) 400 MCG tablet Take 400 mcg by mouth daily.     levothyroxine (SYNTHROID) 75 MCG tablet Take 75 mcg by mouth daily before breakfast.     losartan (COZAAR) 25 MG tablet TAKE ONE-HALF TABLET BY MOUTH ONE TIME DAILY 90 tablet 3   mexiletine (MEXITIL) 150 MG capsule Take 1 capsule (150 mg total) by mouth every 12 (twelve) hours. 60 capsule 0   pravastatin (PRAVACHOL) 20 MG tablet TAKE ONE TABLET BY MOUTH ONE TIME DAILY 90 tablet 3   torsemide (DEMADEX) 20 MG tablet Take 0.5 tablets (10 mg total) by mouth daily. 45 tablet 3   vitamin B-12 (CYANOCOBALAMIN) 100 MCG tablet Take 100 mcg by mouth daily.     vitamin E 180 MG (400 UNITS) capsule Take 400 Units by mouth daily.     No current facility-administered medications for this visit.   No Known Allergies  Social History   Socioeconomic History   Marital status: Married    Spouse name: Brodhi Penilla   Number of children: Not on file   Years of education: Not on file   Highest education level: Not on file  Occupational History   Not on file  Tobacco  Use   Smoking status: Former   Smokeless tobacco: Former    Types: Associate Professor Use: Unknown  Substance and Sexual Activity   Alcohol use: No   Drug use: Not on file   Sexual activity: Not on file  Other Topics Concern   Not on file  Social History Narrative   Not on file   Social Determinants of Health   Financial Resource Strain: Not on file  Food Insecurity: Not on file  Transportation Needs: Not on file  Physical Activity: Not on file  Stress: Not on file  Social Connections: Not on file  Intimate Partner Violence: Not on file   No family history on file. Vitals:   09/29/22 1031  BP: 122/67  Pulse: 65  SpO2: 98%    BP 122/67 (BP Location: Right  Arm, Patient Position: Sitting)   Pulse 65   Wt 251 lb 3.2 oz (113.9 kg)   SpO2 98%   BMI 39.34 kg/m   Wt Readings from Last 3 Encounters:  09/29/22 251 lb 3.2 oz (113.9 kg)  07/27/22 252 lb 3.2 oz (114.4 kg)  12/17/21 257 lb (116.6 kg)   PHYSICAL EXAM: General:  Obese male No resp difficulty HEENT: normal Neck: supple. no JVD. Carotids 2+ bilat; no bruits. No lymphadenopathy or thryomegaly appreciated. Cor: PMI nondisplaced. Regular rate & rhythm. No rubs, gallops or murmurs. Lungs: clear Abdomen: obese soft, nontender, nondistended. No hepatosplenomegaly. No bruits or masses. Good bowel sounds. Extremities: no cyanosis, clubbing, rash, edema Neuro: alert & orientedx3, cranial nerves grossly intact. moves all 4 extremities w/o difficulty. Affect pleasant   ASSESSMENT & PLAN:   1. Chronic Systolic Heart Failure  - Echo (10/22): EF down from 55-->20-25%.  - R/LHC (10/22): with no CAD, elevated filling pressures and low output heart failure.  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 Sleep study 3/23 negative (AHI 2) - Echo 05/14/21 EF 30% RV normal  - Echo 12/17/21 EF 35-40% moderate MR.  - Echo 07/27/22: EF 30-35% moderate MR - cMRI 5/24 EF 31% no LGE or infiltrative process. RVEF 43% - Zio 4/24 SR - average rate 66. Nine runs NSVT  3.1% PVCs - Improved NYHA II Volume status looks good  - Continue torsemide to 10 mg daily  - Continue jardiance 10 mg daily - Continue spironolactone 12.5 mg daily - Continue losartan 12.5  - Add Toprol 25 - EF essentially unchanged despite PVC suppression. cMRI confirms NICM. Doubt LBBB wide enough to cause CM.  - We discussed potential ICD but he would like to defer for now  - Labs today  2. NSVT, PVC  - In SR. PVCs suppressed - - Zio 4/24 SR - average rate 66. Nine runs NSVT  3.1% PVCs - Continue mexilitene 150 mg bid. - Sleep study negative 3/23  3. Morbid Obesity  - Body mass index  is 39.34 kg/m. - Continue weight loss efforts  4. LBBB - plan as above - QRS 136 -  doubt this causing CM   Arvilla Meres MD 10:59 AM

## 2022-10-05 ENCOUNTER — Telehealth: Payer: Self-pay

## 2022-10-05 NOTE — Telephone Encounter (Signed)
Patient's spouse, Steward Drone, called to report that patient has been experiencing extreme fatigue and weakness since starting metoprolol 25 mg daily at last appt on 6/19. She confirms he has been taking the medication before bedtime. She states they do not check blood pressure at home, so she's unsure if blood pressure has changed.  Patient stopped taking the medication last night. Pt spouse asking for advise if it is okay to stop this medication immediately, and if he will need to try an alternate medication.  Will forward to Dr. Gala Romney to advise.

## 2022-10-06 NOTE — Telephone Encounter (Signed)
Spoke with patient to inform him Dr. Gala Romney states it's okay to stop taking his metoprolol.  Pt verbalized understanding. Pt states he felt less tired yesterday after one day of not taking medication. Instructed patient to call for any other questions or concerns.

## 2022-12-08 LAB — LAB REPORT - SCANNED
EGFR: 84
HM Hepatitis Screen: NEGATIVE

## 2022-12-19 IMAGING — DX DG CHEST 1V PORT
1 series · 2 of 2 positions shown · non-contrast
Comparison: 06/23/2011

CLINICAL DATA: Shortness of breath, leg swelling

EXAM:
PORTABLE CHEST 1 VIEW

[Series 1: chest ap · 0.14mm/px · 2 of 2 slices shown]
[im 1/2]
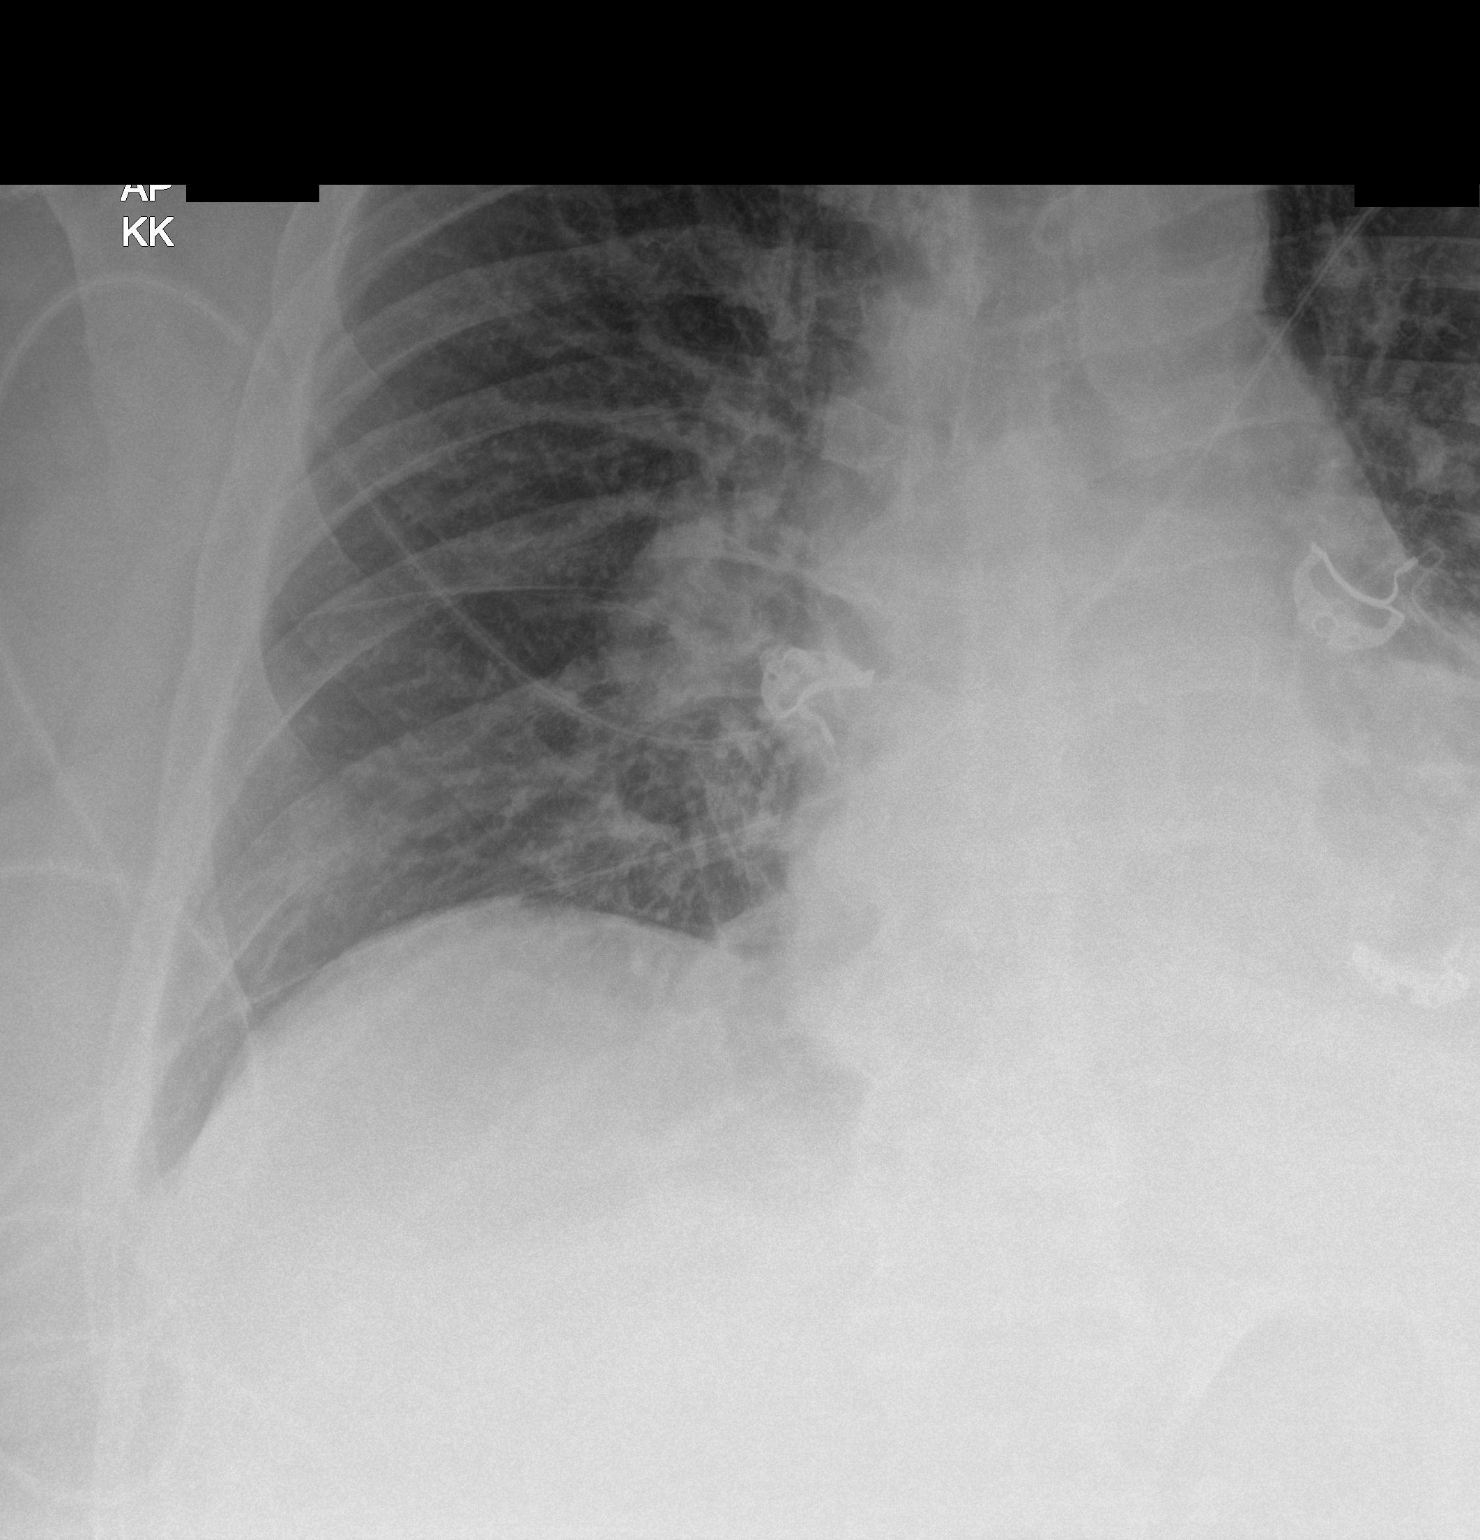
[im 2/2]
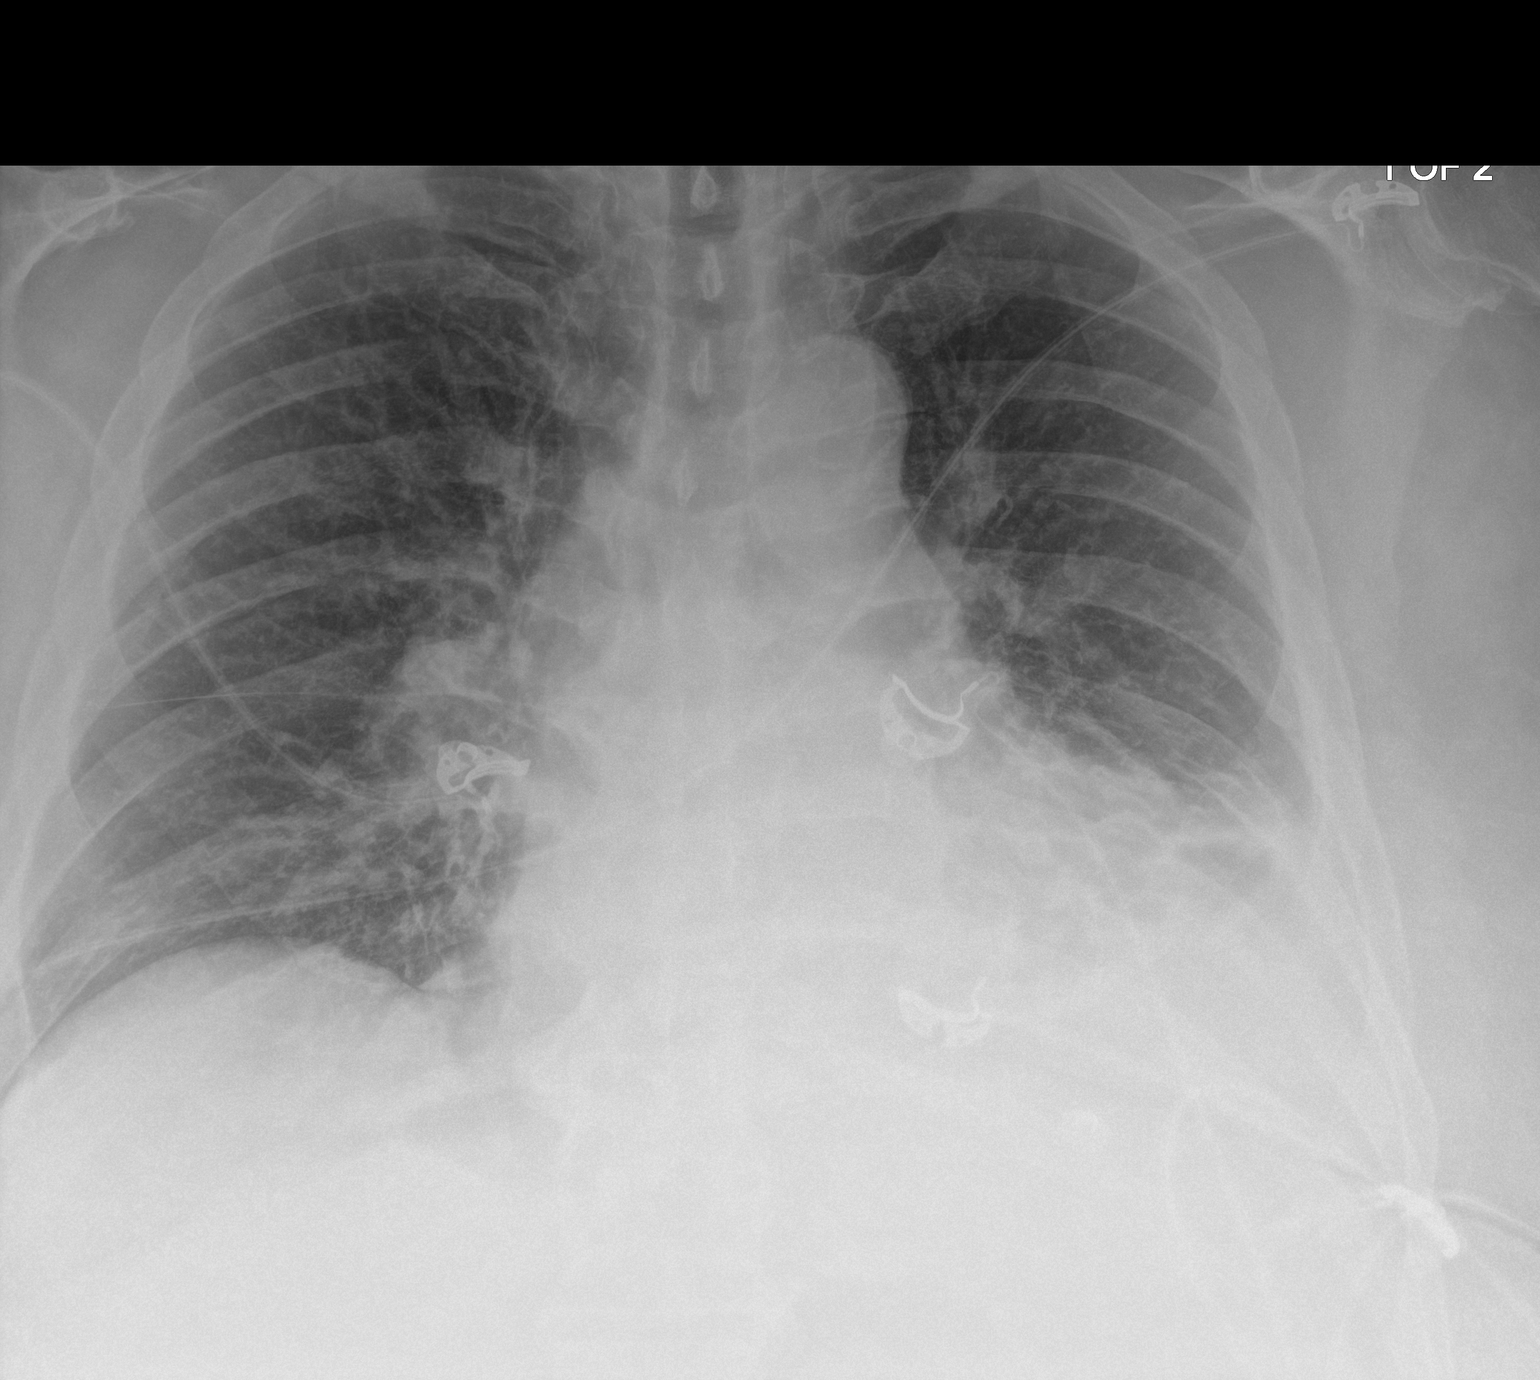

[2 of 2 positions shown; findings below may reference images not displayed]

FINDINGS: Mild cardiomegaly, vascular congestion. Left basilar atelectasis or
infiltrate. Suspect small left effusion. No overt edema. No acute
bony abnormality.
IMPRESSION: Borderline cardiomegaly, vascular congestion.

Left lower lobe atelectasis or infiltrate.  Small left effusion.

## 2022-12-31 ENCOUNTER — Ambulatory Visit: Payer: Medicare HMO | Attending: Internal Medicine | Admitting: Internal Medicine

## 2022-12-31 DIAGNOSIS — I5042 Chronic combined systolic (congestive) and diastolic (congestive) heart failure: Secondary | ICD-10-CM

## 2022-12-31 DIAGNOSIS — I493 Ventricular premature depolarization: Secondary | ICD-10-CM | POA: Diagnosis not present

## 2022-12-31 NOTE — Progress Notes (Signed)
ADVANCED HF CLINIC  NOTE  Primary Care: Jaclyn Shaggy, MD Primary HF Cardiologist: Dr. Gala Romney  HPI: [name] is a 77 y.o. with history includes combined systolic/diastolic HF, HTN, HLD , PVCs, and obesity. Had Echo 04/18/20  LVEF 60-65% w/ G2DD and normal RV.   Admitted to [facility] 10/22  acute HFrEF. Echo  EF 25-30% from previous EF 60-65%. R/LHC w/ no CAD, moderately elevated left heart and pulmonary artery pressures, severely elevated right heart filling pressures, severely reduced Fick cardiac output/index.  Required intropes, pressor, and lasix drip.  Transferred to [facility] for cardiogenic/septic shock, + MSSA bacteremia. Underwent TEE showing EF 25-30%, RV moderately down, severe biatrial enlargement and moderate MR, no valvular vegetation. Required addition of mexitiline to suppress PVCs. Hospitalization c/b AKI and hypoxic/hypercarbic respiratory failure.  Discharged from [facility] 03/11/21 after he completed IV antibiotics and rehab.   At last visit midodrine stopped and GDMT started.   Sleep study 4/23 AHI 2  Here for routine f/u. Here for f/u with his wife. Says he is doing pretty good. 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 Doing arm exercises for 20 mins eery morning. Walking 1 mile every day or riding stationary bike. At last visit started Toprol 25 but had fatigue so cut back to 12.5. Still has poor balance.    cMRI 5/24 EF 31% no LGE or infiltrative process. RVEF 43% Zio 4/24 SR - average rate 66. Nine runs NSVT  3.1% PVCs Echo  9/23 EF 35-40% moderate MR.  Echo 07/27/22 EF 30-35% moderate MR  Cardiac Studies Echo 2/23 EF 30%  RV normal.  Echo 04/2020 EF 60-65%.   LHC/RHC 01/2021  no CAD  RA 20 PA 48/29 (35)  PCWP 25 CO 3.7 CI 1.4  Past Medical History:  Diagnosis Date   Depression    Hypertension    Hypothyroidism    Current Outpatient Medications  Medication Sig Dispense Refill   Ascorbic Acid (VITAMIN C) 1000 MG tablet Take 1,000 mg by mouth daily.     aspirin  325 MG tablet Take 325 mg by mouth daily.     Cholecalciferol (VITAMIN D3) 125 MCG (5000 UT) CAPS Take 1 capsule by mouth daily.     empagliflozin (JARDIANCE) 10 MG TABS tablet Take 1 tablet (10 mg total) by mouth daily before breakfast. 90 tablet 3   folic acid (FOLVITE) 400 MCG tablet Take 400 mcg by mouth daily.     levothyroxine (SYNTHROID) 75 MCG tablet Take 75 mcg by mouth daily before breakfast.     losartan (COZAAR) 25 MG tablet TAKE ONE-HALF TABLET BY MOUTH ONE TIME DAILY 90 tablet 3   metoprolol succinate (TOPROL-XL) 25 MG 24 hr tablet Take 1 tablet (25 mg total) by mouth daily. Take 1 tablet at bedtime. Take with or immediately following a meal. (Patient taking differently: Take 25 mg by mouth daily. Take 1/2 tablet at bedtime. Take with or immediately following a meal.) 90 tablet 1   pravastatin (PRAVACHOL) 20 MG tablet TAKE ONE TABLET BY MOUTH ONE TIME DAILY 90 tablet 3   torsemide (DEMADEX) 20 MG tablet Take 0.5 tablets (10 mg total) by mouth daily. 45 tablet 3   vitamin B-12 (CYANOCOBALAMIN) 100 MCG tablet Take 100 mcg by mouth daily.     vitamin E 180 MG (400 UNITS) capsule Take 400 Units by mouth daily.     mexiletine (MEXITIL) 150 MG capsule Take 1 capsule (150 mg total) by mouth every 12 (twelve) hours. 60 capsule  0   No current facility-administered medications for this visit.   No Known Allergies  Social History   Socioeconomic History   Marital status: Married    Spouse name: Pearley Thresher   Number of children: Not on file   Years of education: Not on file   Highest education level: Not on file  Occupational History   Not on file  Tobacco Use   Smoking status: Former   Smokeless tobacco: Former    Types: Engineer, drilling   Vaping status: Unknown  Substance and Sexual Activity   Alcohol use: No   Drug use: Not on file   Sexual activity: Not on file  Other Topics Concern   Not on file  Social History Narrative   Not on file   Social Determinants of Health    Financial Resource Strain: Not on file  Food Insecurity: Not on file  Transportation Needs: Not on file  Physical Activity: Not on file  Stress: Not on file  Social Connections: Not on file  Intimate Partner Violence: Not on file   No family history on file. Vitals:   12/31/22 1001  BP: 106/65  Pulse: 67  SpO2: 98%    BP 106/65   Pulse 67   Wt 253 lb (114.8 kg)   SpO2 98%   BMI 39.63 kg/m   Wt Readings from Last 3 Encounters:  12/31/22 253 lb (114.8 kg)  09/29/22 251 lb 3.2 oz (113.9 kg)  07/27/22 252 lb 3.2 oz (114.4 kg)   PHYSICAL EXAM: General:  Obese male No resp difficulty Neck: supple. no JVD. Carotids 2+ bilat; no bruits. No lymphadenopathy or thryomegaly appreciated. Cor: PMI nondisplaced. Regular rate & rhythm. No rubs, gallops or murmurs. Lungs: clear Abdomen: obese soft, nontender, nondistended. No hepatosplenomegaly. No bruits or masses. Good bowel sounds. Extremities: no cyanosis, clubbing, rash, tr edema Neuro: alert & orientedx3, cranial nerves grossly intact. moves all 4 extremities w/o difficulty. Affect pleasant    ASSESSMENT & PLAN:   1. Chronic Systolic Heart Failure  - Echo (10/22): EF down from 55-->20-25%.  - R/LHC (10/22): with no CAD, elevated filling pressures and low output heart failure.  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 Sleep study 3/23 negative (AHI 2) - Echo 05/14/21 EF 30% RV normal  - Echo 12/17/21 EF 35-40% moderate MR.  - Echo 07/27/22: EF 30-35% moderate MR - cMRI 5/24 EF 31% no LGE or infiltrative process. RVEF 43% - Zio 4/24 SR - average rate 66. Nine runs NSVT  3.1% PVCs - Improved NYHA II-III Volume status ok - Continue torsemide 10 mg daily. Discussed sliding scale torsemide as needed - Continue jardiance 10 mg daily - Continue spironolactone 12.5 mg daily - Continue losartan 12.5  - Continue Toprol 12.5 unable to tolerate higher - EF essentially unchanged  despite PVC suppression. cMRI confirms NICM. Doubt LBBB wide enough to cause CM.  - We discussed potential ICD again but he would like to defer for now   2. NSVT, PVC  - In SR. PVCs suppressed - Zio 4/24 SR - average rate 66. Nine runs NSVT  3.1% PVCs - Continue mexilitene 150 mg bid. - Sleep study negative 3/23  3. Morbid Obesity  - Body mass index is 39.63 kg/m. - Continue weight loss efforts  4. LBBB - plan as above - QRS 136 -  doubt this causing CM  5. Poor balance - send for outpatient PT/OT  eval   Arvilla Meres MD 10:13 AM

## 2022-12-31 NOTE — Patient Instructions (Addendum)
   Referrals:  You have been referred to PT/OT therapy ( physical therapy). You will receive a call to schedule your appointment.   Special Instructions // Education:  Do the following things EVERYDAY: Weigh yourself in the morning before breakfast. Write it down and keep it in a log. Take your medicines as prescribed Eat low salt foods--Limit salt (sodium) to 2000 mg per day.  Stay as active as you can everyday Limit all fluids for the day to less than 2 liters   Follow-Up in: follow up in 3 months with Dr. Gala Romney.    If you have any questions or concerns before your next appointment please send [place] a message through [place] or call our office at [phone] Monday-Friday 8 am-5 pm.   If you have an urgent need after hours on the weekend please call your Primary Cardiologist or the [facility] in [place] at [phone].

## 2023-01-06 ENCOUNTER — Other Ambulatory Visit (HOSPITAL_COMMUNITY): Payer: Self-pay

## 2023-01-06 MED ORDER — EMPAGLIFLOZIN 10 MG PO TABS: 10.0000 mg | ORAL_TABLET | Freq: Every day | ORAL | 3 refills | Status: DC

## 2023-02-08 ENCOUNTER — Other Ambulatory Visit (HOSPITAL_COMMUNITY): Payer: Self-pay

## 2023-03-09 ENCOUNTER — Telehealth (HOSPITAL_COMMUNITY): Payer: Self-pay

## 2023-03-09 ENCOUNTER — Other Ambulatory Visit (HOSPITAL_COMMUNITY): Payer: Self-pay

## 2023-03-09 DIAGNOSIS — I5042 Chronic combined systolic (congestive) and diastolic (congestive) heart failure: Secondary | ICD-10-CM

## 2023-03-09 MED ORDER — EMPAGLIFLOZIN 10 MG PO TABS
10.0000 mg | ORAL_TABLET | Freq: Every day | ORAL | 0 refills | Status: DC
Start: 2023-03-09 — End: 2023-10-06

## 2023-03-09 MED ORDER — PRAVASTATIN SODIUM 20 MG PO TABS
20.0000 mg | ORAL_TABLET | Freq: Every day | ORAL | 0 refills | Status: DC
Start: 2023-03-09 — End: 2023-03-21

## 2023-03-09 NOTE — Telephone Encounter (Signed)
Patient's Jardiance medication has been sent to his pharmacy.

## 2023-03-17 ENCOUNTER — Telehealth: Payer: Self-pay | Admitting: Internal Medicine

## 2023-03-17 NOTE — Telephone Encounter (Signed)
Pt confirmed appt for 03/18/23

## 2023-03-18 ENCOUNTER — Ambulatory Visit: Payer: Medicare HMO | Attending: Internal Medicine | Admitting: Internal Medicine

## 2023-03-18 VITALS — BP 124/68 | HR 63 | Wt 261.0 lb

## 2023-03-18 DIAGNOSIS — I447 Left bundle-branch block, unspecified: Secondary | ICD-10-CM | POA: Diagnosis not present

## 2023-03-18 DIAGNOSIS — I493 Ventricular premature depolarization: Secondary | ICD-10-CM

## 2023-03-18 DIAGNOSIS — I5022 Chronic systolic (congestive) heart failure: Secondary | ICD-10-CM

## 2023-03-18 NOTE — Patient Instructions (Signed)
Take Torsemide 20 mg (1 tab) Daily for 3-5 days until swelling improves, then return to 10 mg (1/2 tab) Daily  Do the following things EVERYDAY: Weigh yourself in the morning before breakfast. Write it down and keep it in a log. Take your medicines as prescribed Eat low salt foods--Limit salt (sodium) to 2000 mg per day.  Stay as active as you can everyday Limit all fluids for the day to less than 2 liters  Your physician recommends that you schedule a follow-up appointment in: 1 month

## 2023-03-18 NOTE — Progress Notes (Signed)
ADVANCED HF CLINIC  NOTE  Primary Care: Jaclyn Shaggy, MD Primary HF Cardiologist: Dr. Gala Romney  HPI: [name] is a 77 y.o. with history includes combined systolic/diastolic HF, HTN, HLD , PVCs, and obesity. Had Echo 04/18/20  LVEF 60-65% w/ G2DD and normal RV.   Admitted to [facility] 10/22  acute HFrEF. Echo  EF 25-30% from previous EF 60-65%. R/LHC w/ no CAD, moderately elevated left heart and pulmonary artery pressures, severely elevated right heart filling pressures, severely reduced Fick cardiac output/index.  Required intropes, pressor, and lasix drip.  Transferred to [facility] for cardiogenic/septic shock, + MSSA bacteremia. Underwent TEE showing EF 25-30%, RV moderately down, severe biatrial enlargement and moderate MR, no valvular vegetation. Required addition of mexitiline to suppress PVCs. Hospitalization c/b AKI and hypoxic/hypercarbic respiratory failure.  Discharged from [facility] 03/11/21 after he completed IV antibiotics and rehab.   At last visit midodrine stopped and GDMT started.   Sleep study 4/23 AHI 2  Here for routine f/u. Here for f/u with his wife. Says he is doing well. 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 Doing arm exercises for 20 mins every morning. Walking 1 mile every day or riding stationary bike. Previously started Toprol 25 but had fatigue so cut back to 12.5. Awaiting PT/OT eval for poor balance. Says fluid is well controlled. Hasn't needed extra diuretic.     cMRI 5/24 EF 31% no LGE or infiltrative process. RVEF 43% Zio 4/24 SR - average rate 66. Nine runs NSVT  3.1% PVCs Echo  9/23 EF 35-40% moderate MR.  Echo 07/27/22 EF 30-35% moderate MR  Cardiac Studies Echo 2/23 EF 30%  RV normal.  Echo 04/2020 EF 60-65%.   LHC/RHC 01/2021  no CAD  RA 20 PA 48/29 (35)  PCWP 25 CO 3.7 CI 1.4  Past Medical History:  Diagnosis Date   Depression    Hypertension    Hypothyroidism    Current Outpatient Medications  Medication Sig Dispense Refill   Ascorbic Acid  (VITAMIN C) 1000 MG tablet Take 1,000 mg by mouth daily.     aspirin 325 MG tablet Take 325 mg by mouth daily.     Cholecalciferol (VITAMIN D3) 125 MCG (5000 UT) CAPS Take 1 capsule by mouth daily.     empagliflozin (JARDIANCE) 10 MG TABS tablet Take 1 tablet (10 mg total) by mouth daily before breakfast. 60 tablet 0   folic acid (FOLVITE) 400 MCG tablet Take 400 mcg by mouth daily.     levothyroxine (SYNTHROID) 75 MCG tablet Take 75 mcg by mouth daily before breakfast.     losartan (COZAAR) 25 MG tablet TAKE ONE-HALF TABLET BY MOUTH ONE TIME DAILY 90 tablet 3   metoprolol succinate (TOPROL-XL) 25 MG 24 hr tablet Take 1 tablet (25 mg total) by mouth daily. Take 1 tablet at bedtime. Take with or immediately following a meal. 90 tablet 1   mexiletine (MEXITIL) 150 MG capsule Take 1 capsule (150 mg total) by mouth every 12 (twelve) hours. 60 capsule 0   pravastatin (PRAVACHOL) 20 MG tablet Take 1 tablet (20 mg total) by mouth daily. 60 tablet 0   torsemide (DEMADEX) 20 MG tablet Take 0.5 tablets (10 mg total) by mouth daily. 45 tablet 3   vitamin B-12 (CYANOCOBALAMIN) 100 MCG tablet Take 100 mcg by mouth daily.     vitamin E 180 MG (400 UNITS) capsule Take 400 Units by mouth daily.     No current facility-administered medications for this visit.  No Known Allergies  Social History   Socioeconomic History   Marital status: Married    Spouse name: Anthony Weber   Number of children: Not on file   Years of education: Not on file   Highest education level: Not on file  Occupational History   Not on file  Tobacco Use   Smoking status: Former   Smokeless tobacco: Former    Types: Engineer, drilling   Vaping status: Unknown  Substance and Sexual Activity   Alcohol use: No   Drug use: Not on file   Sexual activity: Not on file  Other Topics Concern   Not on file  Social History Narrative   Not on file   Social Determinants of Health   Financial Resource Strain: Not on file  Food  Insecurity: Not on file  Transportation Needs: Not on file  Physical Activity: Not on file  Stress: Not on file  Social Connections: Not on file  Intimate Partner Violence: Not on file   No family history on file. Vitals:   03/18/23 0958 03/18/23 1002  BP: 132/62 124/68  Pulse: 63   SpO2: 99%      BP 124/68 (BP Location: Left Arm)   Pulse 63   Wt 261 lb (118.4 kg)   SpO2 99%   BMI 40.88 kg/m   Wt Readings from Last 3 Encounters:  03/18/23 261 lb (118.4 kg)  12/31/22 253 lb (114.8 kg)  09/29/22 251 lb 3.2 oz (113.9 kg)   PHYSICAL EXAM: General:  Obese male No resp difficulty HEENT: normal Neck: supple. JVP 8-9. Carotids 2+ bilat; no bruits. No lymphadenopathy or thryomegaly appreciated. Cor: PMI nondisplaced. Regular rate & rhythm. No rubs, gallops or murmurs. Lungs: clear Abdomen: obese soft, nontender, nondistended. No hepatosplenomegaly. No bruits or masses. Good bowel sounds. Extremities: no cyanosis, clubbing, rash, 1+ edema Neuro: alert & orientedx3, cranial nerves grossly intact. moves all 4 extremities w/o difficulty. Affect pleasant   ASSESSMENT & PLAN:   1. Chronic Systolic Heart Failure  - Echo (10/22): EF down from 55-->20-25%.  - R/LHC (10/22): with no CAD, elevated filling pressures and low output heart failure.  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 Sleep study 3/23 negative (AHI 2) - Echo 05/14/21 EF 30% RV normal  - Echo 12/17/21 EF 35-40% moderate MR.  - Echo 07/27/22: EF 30-35% moderate MR - cMRI 5/24 EF 31% no LGE or infiltrative process. RVEF 43% - Zio 4/24 SR - average rate 66. Nine runs NSVT  3.1% PVCs - Stable NYHA II-III Volume is up today on exam - Currently on torsemide 10 daily. Will increase to 20 daily for 3-5 days until weight returns to baseline. Discussed use of sliding scale diuretics especially over the holidsy - Continue jardiance 10 mg daily - Continue spironolactone 12.5 mg  daily - Continue losartan 12.5  - Continue Toprol 12.5 unable to tolerate higher - EF essentially unchanged despite PVC suppression. cMRI confirms NICM. Doubt LBBB wide enough to cause CM.  - Remains uninterested in ICD   2. NSVT, PVC  - Remains in NSR.  PVCs suppressed - Zio 4/24 SR - average rate 66. Nine runs NSVT  3.1% PVCs - Continue mexilitene 150 mg bid. - Sleep study negative 3/23  3. Morbid Obesity  - Body mass index is 40.88 kg/m. - Continue weight loss efforts. Previously refused GLP1RA  4. LBBB - plan as above - QRS 136 -  doubt this causing CM  5. Poor balance - Needs outpatient PT/OT eval   Arvilla Meres MD 10:38 AM

## 2023-03-21 ENCOUNTER — Other Ambulatory Visit: Payer: Self-pay

## 2023-03-21 DIAGNOSIS — I5042 Chronic combined systolic (congestive) and diastolic (congestive) heart failure: Secondary | ICD-10-CM

## 2023-03-21 MED ORDER — PRAVASTATIN SODIUM 20 MG PO TABS
20.0000 mg | ORAL_TABLET | Freq: Every day | ORAL | 11 refills | Status: DC
Start: 2023-03-21 — End: 2023-11-10
  Filled 2023-05-04: qty 90, 90d supply, fill #0
  Filled 2023-05-05: qty 30, 30d supply, fill #0
  Filled 2023-06-07: qty 30, 30d supply, fill #1
  Filled 2023-07-17: qty 30, 30d supply, fill #2
  Filled 2023-08-07 – 2023-08-09 (×2): qty 30, 30d supply, fill #3
  Filled 2023-09-13: qty 30, 30d supply, fill #4
  Filled 2023-10-10: qty 30, 30d supply, fill #5
  Filled 2023-11-09: qty 100, 100d supply, fill #6

## 2023-03-24 ENCOUNTER — Other Ambulatory Visit: Payer: Self-pay | Admitting: Internal Medicine

## 2023-03-28 ENCOUNTER — Other Ambulatory Visit (HOSPITAL_COMMUNITY): Payer: Self-pay

## 2023-03-28 ENCOUNTER — Other Ambulatory Visit: Payer: Self-pay

## 2023-04-15 ENCOUNTER — Other Ambulatory Visit: Payer: Self-pay

## 2023-04-15 DIAGNOSIS — I5042 Chronic combined systolic (congestive) and diastolic (congestive) heart failure: Secondary | ICD-10-CM

## 2023-04-19 ENCOUNTER — Other Ambulatory Visit: Payer: Self-pay

## 2023-04-19 MED ORDER — TORSEMIDE 20 MG PO TABS
10.0000 mg | ORAL_TABLET | Freq: Every day | ORAL | 0 refills | Status: DC
  Filled 2023-04-19: qty 45, 90d supply, fill #0

## 2023-04-20 ENCOUNTER — Other Ambulatory Visit (HOSPITAL_COMMUNITY): Payer: Self-pay

## 2023-04-21 ENCOUNTER — Other Ambulatory Visit: Payer: Self-pay

## 2023-04-21 DIAGNOSIS — I5043 Acute on chronic combined systolic (congestive) and diastolic (congestive) heart failure: Secondary | ICD-10-CM

## 2023-04-21 MED ORDER — TORSEMIDE 20 MG PO TABS
10.0000 mg | ORAL_TABLET | Freq: Every day | ORAL | 3 refills | Status: AC
Start: 2023-04-21 — End: ?

## 2023-04-28 ENCOUNTER — Ambulatory Visit: Payer: PPO | Attending: Internal Medicine | Admitting: Internal Medicine

## 2023-04-28 VITALS — BP 120/59 | HR 57 | Wt 257.0 lb

## 2023-04-28 DIAGNOSIS — I447 Left bundle-branch block, unspecified: Secondary | ICD-10-CM | POA: Diagnosis not present

## 2023-04-28 DIAGNOSIS — R2689 Other abnormalities of gait and mobility: Secondary | ICD-10-CM | POA: Diagnosis not present

## 2023-04-28 DIAGNOSIS — I5042 Chronic combined systolic (congestive) and diastolic (congestive) heart failure: Secondary | ICD-10-CM

## 2023-04-28 DIAGNOSIS — I493 Ventricular premature depolarization: Secondary | ICD-10-CM | POA: Diagnosis not present

## 2023-04-28 DIAGNOSIS — I5022 Chronic systolic (congestive) heart failure: Secondary | ICD-10-CM

## 2023-04-28 NOTE — Progress Notes (Signed)
ADVANCED HF CLINIC  NOTE  Primary Care: Anthony Shaggy, MD Primary HF Cardiologist: Dr. Gala Weber  HPI: [name] is a 78 y.o. with history includes combined systolic/diastolic HF, HTN, HLD , PVCs, and obesity. Had Echo 04/18/20  LVEF 60-65% w/ G2DD and normal RV.   Admitted to [facility] 10/22  acute HFrEF. Echo  EF 25-30% from previous EF 60-65%. R/LHC w/ no CAD, moderately elevated left heart and pulmonary artery pressures, severely elevated right heart filling pressures, severely reduced Fick cardiac output/index.  Required intropes, pressor, and lasix drip.  Transferred to [facility] for cardiogenic/septic shock, + MSSA bacteremia. Underwent TEE showing EF 25-30%, RV moderately down, severe biatrial enlargement and moderate MR, no valvular vegetation. Required addition of mexitiline to suppress PVCs. Hospitalization c/b AKI and hypoxic/hypercarbic respiratory failure.  Discharged from [facility] 03/11/21 after he completed IV antibiotics and rehab.   At last visit midodrine stopped and GDMT started.   Sleep study 4/23 AHI 2  Here for routine f/u. Here for f/u with his wife. Says he is doing pretty well. 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 Walking 1 mile at the [place] almost everyday. And riding stationary bike for 30 mins every night.   cMRI 5/24 EF 31% no LGE or infiltrative process. RVEF 43% Zio 4/24 SR - average rate 66. Nine runs NSVT  3.1% PVCs Echo  9/23 EF 35-40% moderate MR.  Echo 07/27/22 EF 30-35% moderate MR  Cardiac Studies Echo 2/23 EF 30%  RV normal.  Echo 04/2020 EF 60-65%.   LHC/RHC 01/2021  no CAD  RA 20 PA 48/29 (35)  PCWP 25 CO 3.7 CI 1.4  Past Medical History:  Diagnosis Date   Depression    Hypertension    Hypothyroidism    Current Outpatient Medications  Medication Sig Dispense Refill   Ascorbic Acid (VITAMIN C) 1000 MG tablet Take 1,000 mg by mouth daily.     aspirin 325 MG tablet Take 325 mg by mouth daily.     Cholecalciferol (VITAMIN D3) 125 MCG (5000 UT) CAPS  Take 1 capsule by mouth daily.     empagliflozin (JARDIANCE) 10 MG TABS tablet Take 1 tablet (10 mg total) by mouth daily before breakfast. 60 tablet 0   folic acid (FOLVITE) 400 MCG tablet Take 400 mcg by mouth daily.     levothyroxine (SYNTHROID) 75 MCG tablet Take 75 mcg by mouth daily before breakfast.     losartan (COZAAR) 25 MG tablet TAKE ONE-HALF TABLET BY MOUTH ONE TIME DAILY 90 tablet 3   metoprolol succinate (TOPROL-XL) 25 MG 24 hr tablet TAKE ONE TABLET BY MOUTH AT BEDTIME WITH OR IMMEDIATELY FOLLOWING A MEAL 90 tablet 1   mexiletine (MEXITIL) 150 MG capsule Take 1 capsule (150 mg total) by mouth every 12 (twelve) hours. 60 capsule 0   pravastatin (PRAVACHOL) 20 MG tablet Take 1 tablet (20 mg total) by mouth daily. 30 tablet 11   torsemide (DEMADEX) 20 MG tablet Take 20 mg by mouth daily.     vitamin B-12 (CYANOCOBALAMIN) 100 MCG tablet Take 100 mcg by mouth daily.     vitamin E 180 MG (400 UNITS) capsule Take 400 Units by mouth daily.     No current facility-administered medications for this visit.   No Known Allergies  Social History   Socioeconomic History   Marital status: Married    Spouse name: Anthony Weber   Number of children: Not on file   Years of education: Not on file  Highest education level: Not on file  Occupational History   Not on file  Tobacco Use   Smoking status: Former   Smokeless tobacco: Former    Types: Engineer, drilling   Vaping status: Unknown  Substance and Sexual Activity   Alcohol use: No   Drug use: Not on file   Sexual activity: Not on file  Other Topics Concern   Not on file  Social History Narrative   Not on file   Social Drivers of Health   Financial Resource Strain: Not on file  Food Insecurity: Not on file  Transportation Needs: Not on file  Physical Activity: Not on file  Stress: Not on file  Social Connections: Not on file  Intimate Partner Violence: Not on file   No family history on file. Vitals:   04/28/23 1019   BP: (!) 120/59  Pulse: (!) 57  SpO2: 96%     BP (!) 120/59   Pulse (!) 57   Wt 257 lb (116.6 kg)   SpO2 96%   BMI 40.25 kg/m   Wt Readings from Last 3 Encounters:  04/28/23 257 lb (116.6 kg)  03/18/23 261 lb (118.4 kg)  12/31/22 253 lb (114.8 kg)   PHYSICAL EXAM: General:  Well appearing. No resp difficulty HEENT: normal Neck: supple. no JVD. Carotids 2+ bilat; no bruits. No lymphadenopathy or thryomegaly appreciated. Cor: PMI nondisplaced. Regular rate & rhythm. No rubs, gallops or murmurs. Lungs: clear Abdomen: obese soft, nontender, nondistended. No hepatosplenomegaly. No bruits or masses. Good bowel sounds. Extremities: no cyanosis, clubbing, rash, edema Neuro: alert & orientedx3, cranial nerves grossly intact. moves all 4 extremities w/o difficulty. Affect pleasant   ASSESSMENT & PLAN:   1. Chronic Systolic Heart Failure  - Echo (10/22): EF down from 55-->20-25%.  - R/LHC (10/22): with no CAD, elevated filling pressures and low output heart failure.  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 Sleep study 3/23 negative (AHI 2) - Echo 05/14/21 EF 30% RV normal  - Echo 12/17/21 EF 35-40% moderate MR.  - Echo 07/27/22: EF 30-35% moderate MR - cMRI 5/24 EF 31% no LGE or infiltrative process. RVEF 43% - Zio 4/24 SR - average rate 66. Nine runs NSVT  3.1% PVCs - Stable NYHA II-early III. Volume status much improved with increased ot torsemide to 20 daily  - Currently on torsemide 20 daily. - Continue jardiance 10 mg daily - Continue spironolactone 12.5 mg daily - Continue losartan 12.5  - Continue Toprol 25 unable to tolerate higher - EF essentially unchanged despite PVC suppression. cMRI confirms NICM. Doubt LBBB wide enough to cause CM.  - Remains uninterested in ICD - Due for repeat echo at next visit   2. NSVT, PVC  - Remains in NSR.  PVCs suppressed - Zio 4/24 SR - average rate 66. Nine runs NSVT  3.1% PVCs -  Continue mexilitene 150 bid - Sleep study negative 3/23  3. Morbid Obesity  - Body mass index is 40.25 kg/m. - Continue weight loss efforts. - Has refused GLP1RA  4. LBBB - plan as above - QRS 136 -  doubt this causing CM  5. Poor balance - Will schedule outpatient PT/OT eval   Arvilla Meres MD 10:40 AM

## 2023-04-28 NOTE — Patient Instructions (Addendum)
Please have your echo completed. You will check in for this at the [facility]. You have to arrive 15 MINS EARLY for preparation, otherwise you will have to reschedule.  We have placed a referral for physical therapy. They should reach out to you within 1 week. If you do not hear from them, please reach out to them to get scheduled. Their information will be on this AVS.  Dr. Prescott Gum schedule is not yet open for April. We will place you on a recall list and call you once the schedule is open.

## 2023-04-29 DIAGNOSIS — I10 Essential (primary) hypertension: Secondary | ICD-10-CM | POA: Diagnosis not present

## 2023-04-29 DIAGNOSIS — E039 Hypothyroidism, unspecified: Secondary | ICD-10-CM | POA: Diagnosis not present

## 2023-04-29 DIAGNOSIS — E785 Hyperlipidemia, unspecified: Secondary | ICD-10-CM | POA: Diagnosis not present

## 2023-04-30 LAB — LAB REPORT - SCANNED: EGFR: 73

## 2023-05-04 ENCOUNTER — Other Ambulatory Visit (HOSPITAL_COMMUNITY): Payer: Self-pay

## 2023-05-04 ENCOUNTER — Other Ambulatory Visit: Payer: Self-pay

## 2023-05-04 MED ORDER — LEVOTHYROXINE SODIUM 75 MCG PO TABS
75.0000 ug | ORAL_TABLET | Freq: Every morning | ORAL | 3 refills | Status: DC
  Filled 2023-05-04 – 2023-05-05 (×2): qty 90, 90d supply, fill #0

## 2023-05-04 MED ORDER — EMPAGLIFLOZIN 10 MG PO TABS
10.0000 mg | ORAL_TABLET | Freq: Every day | ORAL | 3 refills | Status: DC
  Filled 2023-05-04 – 2023-05-05 (×3): qty 90, 90d supply, fill #0
  Filled 2023-06-07: qty 90, 90d supply, fill #1

## 2023-05-04 MED ORDER — TORSEMIDE 20 MG PO TABS
10.0000 mg | ORAL_TABLET | Freq: Every day | ORAL | 3 refills | Status: DC
  Filled 2023-05-04 – 2023-05-05 (×2): qty 45, 90d supply, fill #0
  Filled 2023-07-17: qty 45, 90d supply, fill #1

## 2023-05-04 MED ORDER — MEXILETINE HCL 150 MG PO CAPS
150.0000 mg | ORAL_CAPSULE | Freq: Two times a day (BID) | ORAL | 3 refills | Status: DC
  Filled 2023-05-04 – 2023-05-05 (×2): qty 180, 90d supply, fill #0

## 2023-05-04 MED FILL — Losartan Potassium Tab 25 MG: ORAL | 180 days supply | Qty: 90 | Fill #0 | Status: CN

## 2023-05-04 MED FILL — Metoprolol Succinate Tab ER 24HR 25 MG (Tartrate Equiv): ORAL | 90 days supply | Qty: 90 | Fill #0 | Status: CN

## 2023-05-05 ENCOUNTER — Other Ambulatory Visit: Payer: Self-pay

## 2023-05-05 ENCOUNTER — Other Ambulatory Visit (HOSPITAL_COMMUNITY): Payer: Self-pay

## 2023-05-05 MED FILL — Losartan Potassium Tab 25 MG: ORAL | 100 days supply | Qty: 50 | Fill #0 | Status: AC

## 2023-05-05 MED FILL — Metoprolol Succinate Tab ER 24HR 25 MG (Tartrate Equiv): ORAL | 90 days supply | Qty: 90 | Fill #0 | Status: AC

## 2023-05-09 ENCOUNTER — Other Ambulatory Visit (HOSPITAL_COMMUNITY): Payer: Self-pay

## 2023-05-10 ENCOUNTER — Other Ambulatory Visit: Payer: Self-pay

## 2023-05-10 ENCOUNTER — Ambulatory Visit: Payer: PPO | Attending: Internal Medicine | Admitting: Physical Therapy

## 2023-05-10 ENCOUNTER — Encounter: Payer: Self-pay | Admitting: Physical Therapy

## 2023-05-10 DIAGNOSIS — I5042 Chronic combined systolic (congestive) and diastolic (congestive) heart failure: Secondary | ICD-10-CM | POA: Diagnosis not present

## 2023-05-10 DIAGNOSIS — R2681 Unsteadiness on feet: Secondary | ICD-10-CM | POA: Diagnosis present

## 2023-05-10 DIAGNOSIS — I5022 Chronic systolic (congestive) heart failure: Secondary | ICD-10-CM | POA: Insufficient documentation

## 2023-05-10 DIAGNOSIS — R2689 Other abnormalities of gait and mobility: Secondary | ICD-10-CM | POA: Insufficient documentation

## 2023-05-10 NOTE — Therapy (Unsigned)
OUTPATIENT PHYSICAL THERAPY NEURO EVALUATION   Patient Name: Anthony Weber MRN: [id number] DOB:1945/10/01, 78 y.o., male Today's Date: 05/10/2023   PCP: Jaclyn Shaggy, MD  REFERRING PROVIDER: Dolores Patty, MD   END OF SESSION:  PT End of Session - 05/10/23 0830     Visit Number 1    Number of Visits 24    Date for PT Re-Evaluation 08/02/23    PT Start Time 0845    PT Stop Time 0930    PT Time Calculation (min) 45 min    Equipment Utilized During Treatment Gait belt    Activity Tolerance Patient tolerated treatment well    Behavior During Therapy WFL for tasks assessed/performed             Past Medical History:  Diagnosis Date   Depression    Hypertension    Hypothyroidism    Past Surgical History:  Procedure Laterality Date   COLONOSCOPY WITH PROPOFOL N/A 03/03/2015   Procedure: COLONOSCOPY WITH PROPOFOL;  Surgeon: Scot Jun, MD;  Location: [facility] ENDOSCOPY;  Service: Endoscopy;  Laterality: N/A;   EYE SURGERY     RIGHT/LEFT HEART CATH AND CORONARY ANGIOGRAPHY N/A 01/27/2021   Procedure: RIGHT/LEFT HEART CATH AND CORONARY ANGIOGRAPHY;  Surgeon: Yvonne Kendall, MD;  Location: [facility] INVASIVE CV LAB;  Service: Cardiovascular;  Laterality: N/A;   TEE WITHOUT CARDIOVERSION N/A 02/05/2021   Procedure: TRANSESOPHAGEAL ECHOCARDIOGRAM (TEE);  Surgeon: Dolores Patty, MD;  Location: [facility] ENDOSCOPY;  Service: Cardiovascular;  Laterality: N/A;   Patient Active Problem List   Diagnosis Date Noted   Hyponatremia    Acute on chronic combined systolic and diastolic congestive heart failure (HCC)    Acute on chronic respiratory failure with hypoxia and hypercapnia (HCC)    MSSA bacteremia 02/03/2021   Central line infection    Pneumonia of both lower lobes due to methicillin susceptible Staphylococcus aureus (MSSA) (HCC)    Cardiogenic shock (HCC) 01/29/2021   Primary hypertension    Acute HFrEF (heart failure with reduced ejection fraction) (HCC)     Acute CHF (congestive heart failure) (HCC) 01/18/2021    ONSET DATE: 3 years.   REFERRING DIAG:  Diagnosis  R26.89 (ICD-10-CM) - Imbalance    THERAPY DIAG:  Unsteadiness on feet  Imbalance  Balance disorder  Rationale for Evaluation and Treatment: Rehabilitation  SUBJECTIVE:                                                                                                                                                                                             SUBJECTIVE STATEMENT: Reports that balance is a little off  since getting out of hospital in 2022. Reports that he is not getting stronger. Pt reports that he performs arm exercises in the morning and walks a little at work. Reports that he is also riding the stationary bike.  Pt accompanied by: self  PERTINENT HISTORY:   From recent MD appointment. "Admitted to [facility] 01/2021  acute HFrEF. Echo  EF 25-30% from previous EF 60-65%. R/LHC w/ no CAD, moderately elevated left heart and pulmonary artery pressures, severely elevated right heart filling pressures, severely reduced Fick cardiac output/index.  Required intropes, pressor, and lasix drip.  Transferred to [facility] for cardiogenic/septic shock, + MSSA bacteremia. Underwent TEE showing EF 25-30%, RV moderately down, severe biatrial enlargement and moderate MR, no valvular vegetation. Required addition of mexitiline to suppress PVCs. Hospitalization c/b AKI and hypoxic/hypercarbic respiratory failure.   Discharged from [facility] 03/11/21 after he completed IV antibiotics and rehab.    At last visit midodrine stopped and GDMT started.    Sleep study 4/23 AHI 2   Here for routine f/u. Here for f/u with his wife. Says he is doing pretty well. 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 Walking 1 mile at the [place] almost everyday. And riding stationary bike for 30 mins every night."  PAIN:  Are you having pain? No  PRECAUTIONS: None  RED FLAGS: None   WEIGHT BEARING RESTRICTIONS:  No  FALLS: Has patient fallen in last 6 months? No  LIVING ENVIRONMENT: Lives with: lives with their spouse Lives in: House/apartment Stairs: Yes: Internal: 12 steps; on right going up and on left going up and External: 1 steps; on right going up and on left going up Has following equipment at home: Single point cane and does not use often    PLOF: Independent, Independent with basic ADLs, Independent with gait, and Independent with transfers  PATIENT GOALS: improve balance and strength in legs.   OBJECTIVE:  Note: Objective measures were completed at Evaluation unless otherwise noted.  DIAGNOSTIC FINDINGS:  None relevant   COGNITION: Overall cognitive status: Within functional limits for tasks assessed   SENSATION: Light touch: Impaired  and intermittent n/t in feet up to shins    COORDINATION: WFL   EDEMA:  Mild edema in bil LE   MUSCLE TONE: WFL   POSTURE: rounded shoulders and forward head  LOWER EXTREMITY ROM:     Grossly WFL  LOWER EXTREMITY MMT:    MMT Right Eval Left Eval  Hip flexion 4 4  Hip extension    Hip abduction 4+ 4+  Hip adduction 4 4  Hip internal rotation    Hip external rotation    Knee flexion 4+ 4+  Knee extension 4+ 4+  Ankle dorsiflexion 4+ 4+  Ankle plantarflexion    Ankle inversion    Ankle eversion    (Blank rows = not tested)  BED MOBILITY:  Sit to supine Complete Independence Supine to sit Complete Independence  TRANSFERS: Assistive device utilized: None  Sit to stand: Complete Independence Stand to sit: Complete Independence Chair to chair: Complete Independence Floor:  unable to complete .  Requires UE support topush from thighs to stand   RAMP:  Level of Assistance: Complete Independence Assistive device utilized:  Ramp Comments:   CURB:  Level of Assistance: Modified independence Assistive device utilized:  rail Curb Comments:   STAIRS: Level of Assistance: SBA Stair Negotiation Technique: Step to  Pattern Alternating Pattern  with Single Rail on Right Number of Stairs: 4  Height of Stairs: 6  Comments: step to on descent  GAIT: Gait pattern: lateral hip instability, trunk flexed, and wide BOS Distance walked: 915 Assistive device utilized: None Level of assistance: Complete Independence Comments: increased flexed posture with increased distance  FUNCTIONAL TESTS:  5 times sit to stand: 19.11 with UE support; 21.44 with UE support on knees.  Timed up and go (TUG): 15.64, 13.74 sec  6 minute walk test: 912ft  10 meter walk test: 12.43 and 11.68 Berg Balance Scale: TBD Dynamic Gait Index: TBD  PATIENT SURVEYS:  ABC scale 60                                                                                                                              TREATMENT DATE: 05/10/2023    EVAL only    PATIENT EDUCATION: Education details: POC Person educated: Patient Education method: Explanation Education comprehension: verbalized understanding  HOME EXERCISE PROGRAM: To be given at visit 2  GOALS: Goals reviewed with patient? Yes  SHORT TERM GOALS: Target date: 06/09/2023   Patient will be independent in home exercise program to improve strength/mobility for better functional independence with ADLs. Baseline: to be given visit 2  Goal status: INITIAL   LONG TERM GOALS: Target date: 08/04/2023    Patient will increase ABC score to equal to or greater than 72  to demonstrate statistically significant improvement in mobility and quality of life.  Baseline: 60 Goal status: INITIAL  2.  Patient (> 34 years old) will complete five times sit to stand test in < 15 seconds indicating an increased LE strength and improved balance. Baseline: 21.44 Goal status: INITIAL  3.  Patient will increase Berg Balance score by > 6 points to demonstrate decreased fall risk during functional activities Baseline: to be completed  Goal status: INITIAL  4.  Patient will increase 10 meter  walk test to >1.63m/s as to improve gait speed for better community ambulation and to reduce fall risk. Baseline:  Goal status: INITIAL  5.  Patient will reduce timed up and go to <11 seconds to reduce fall risk and demonstrate improved transfer/gait ability. Baseline: 14 sec  Goal status: INITIAL  6.  Patient will increase dynamic gait index score to >19/24 as to demonstrate reduced fall risk and improved dynamic gait balance for better safety with community/home ambulation.   Baseline: TBD  Goal status: INITIAL  ASSESSMENT:  CLINICAL IMPRESSION: Patient is a 78 y.o. male who was seen today for physical therapy evaluation and treatment for imbalance, decreased activity tolerance, and weakness. States that he feels like his strength, balance and endurance have not gotten much better since discharging from hospital in 2022. Very active pt, reports walking and riding stationary bike every day. 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 Noted to have decreased gait speed, increased time on TUG and 5 times sit<>stand indicating increased fall risk. Pt will benefit from skilled PT to address strength and balance deficits to reduce  fallrisk, improve safety and improve overall QoL.   OBJECTIVE IMPAIRMENTS: Abnormal gait, cardiopulmonary status limiting activity, decreased activity tolerance, decreased balance, decreased coordination, decreased endurance, decreased knowledge of use of DME, decreased mobility, difficulty walking, decreased ROM, decreased strength, hypomobility, impaired perceived functional ability, impaired flexibility, impaired sensation, improper body mechanics, postural dysfunction, and obesity.   ACTIVITY LIMITATIONS: carrying, lifting, bending, standing, squatting, stairs, transfers, and locomotion level  PARTICIPATION LIMITATIONS: cleaning, laundry, community activity, and occupation  PERSONAL FACTORS: Age and 1-2 comorbidities: obesity CHF  are also affecting patient's  functional outcome.   REHAB POTENTIAL: Good  CLINICAL DECISION MAKING: Stable/uncomplicated  EVALUATION COMPLEXITY: Moderate  PLAN:  PT FREQUENCY: 1-2x/week  PT DURATION: 12 weeks  PLANNED INTERVENTIONS: 97110-Therapeutic exercises, 97530- Therapeutic activity, [id number]- Neuromuscular re-education, 97535- Self Care, [id number]- Manual therapy, [phone]- Gait training, Patient/Family education, Balance training, Stair training, Taping, DME instructions, Cryotherapy, and Moist heat  PLAN FOR NEXT SESSION:  BERG DGI Balance HEP.    Golden Pop, PT 05/10/2023, 8:37 AM

## 2023-05-12 ENCOUNTER — Ambulatory Visit: Payer: PPO

## 2023-05-12 DIAGNOSIS — R2681 Unsteadiness on feet: Secondary | ICD-10-CM | POA: Diagnosis not present

## 2023-05-12 DIAGNOSIS — R2689 Other abnormalities of gait and mobility: Secondary | ICD-10-CM

## 2023-05-12 NOTE — Therapy (Signed)
OUTPATIENT PHYSICAL THERAPY NEURO EVALUATION   Patient Name: Anthony Weber MRN: [id number] DOB:05-Jun-1945, 78 y.o., male Today's Date: 05/12/2023   PCP: Jaclyn Shaggy, MD  REFERRING PROVIDER: Dolores Patty, MD   END OF SESSION:  PT End of Session - 05/12/23 1021     Visit Number 2    Number of Visits 24    Date for PT Re-Evaluation 08/02/23    Progress Note Due on Visit 10    PT Start Time 1018    PT Stop Time 1102    PT Time Calculation (min) 44 min    Equipment Utilized During Treatment Gait belt    Activity Tolerance Patient tolerated treatment well    Behavior During Therapy WFL for tasks assessed/performed             Past Medical History:  Diagnosis Date   Depression    Hypertension    Hypothyroidism    Past Surgical History:  Procedure Laterality Date   COLONOSCOPY WITH PROPOFOL N/A 03/03/2015   Procedure: COLONOSCOPY WITH PROPOFOL;  Surgeon: Scot Jun, MD;  Location: [facility] ENDOSCOPY;  Service: Endoscopy;  Laterality: N/A;   EYE SURGERY     RIGHT/LEFT HEART CATH AND CORONARY ANGIOGRAPHY N/A 01/27/2021   Procedure: RIGHT/LEFT HEART CATH AND CORONARY ANGIOGRAPHY;  Surgeon: Yvonne Kendall, MD;  Location: [facility] INVASIVE CV LAB;  Service: Cardiovascular;  Laterality: N/A;   TEE WITHOUT CARDIOVERSION N/A 02/05/2021   Procedure: TRANSESOPHAGEAL ECHOCARDIOGRAM (TEE);  Surgeon: Dolores Patty, MD;  Location: [facility] ENDOSCOPY;  Service: Cardiovascular;  Laterality: N/A;   Patient Active Problem List   Diagnosis Date Noted   Hyponatremia    Acute on chronic combined systolic and diastolic congestive heart failure (HCC)    Acute on chronic respiratory failure with hypoxia and hypercapnia (HCC)    MSSA bacteremia 02/03/2021   Central line infection    Pneumonia of both lower lobes due to methicillin susceptible Staphylococcus aureus (MSSA) (HCC)    Cardiogenic shock (HCC) 01/29/2021   Primary hypertension    Acute HFrEF (heart failure with  reduced ejection fraction) (HCC)    Acute CHF (congestive heart failure) (HCC) 01/18/2021    ONSET DATE: 3 years.   REFERRING DIAG:  Diagnosis  R26.89 (ICD-10-CM) - Imbalance    THERAPY DIAG:  Unsteadiness on feet  Imbalance  Balance disorder  Rationale for Evaluation and Treatment: Rehabilitation  SUBJECTIVE:  SUBJECTIVE STATEMENT: Reports that balance is a little off since getting out of hospital in 2022. Reports that he is not getting stronger. Pt reports that he performs arm exercises in the morning and walks a little at work. Reports that he is also riding the stationary bike.  Pt accompanied by: self  PERTINENT HISTORY:   From recent MD appointment. "Admitted to [facility] 01/2021  acute HFrEF. Echo  EF 25-30% from previous EF 60-65%. R/LHC w/ no CAD, moderately elevated left heart and pulmonary artery pressures, severely elevated right heart filling pressures, severely reduced Fick cardiac output/index.  Required intropes, pressor, and lasix drip.  Transferred to [facility] for cardiogenic/septic shock, + MSSA bacteremia. Underwent TEE showing EF 25-30%, RV moderately down, severe biatrial enlargement and moderate MR, no valvular vegetation. Required addition of mexitiline to suppress PVCs. Hospitalization c/b AKI and hypoxic/hypercarbic respiratory failure.   Discharged from [facility] 03/11/21 after he completed IV antibiotics and rehab.    At last visit midodrine stopped and GDMT started.    Sleep study 4/23 AHI 2   Here for routine f/u. Here for f/u with his wife. Says he is doing pretty well. 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 Walking 1 mile at the [place] almost everyday. And riding stationary bike for 30 mins every night."  PAIN:  Are you having pain? No  PRECAUTIONS: None  RED  FLAGS: None   WEIGHT BEARING RESTRICTIONS: No  FALLS: Has patient fallen in last 6 months? No  LIVING ENVIRONMENT: Lives with: lives with their spouse Lives in: House/apartment Stairs: Yes: Internal: 12 steps; on right going up and on left going up and External: 1 steps; on right going up and on left going up Has following equipment at home: Single point cane and does not use often    PLOF: Independent, Independent with basic ADLs, Independent with gait, and Independent with transfers  PATIENT GOALS: improve balance and strength in legs.   OBJECTIVE:  Note: Objective measures were completed at Evaluation unless otherwise noted.  DIAGNOSTIC FINDINGS:  None relevant   COGNITION: Overall cognitive status: Within functional limits for tasks assessed   SENSATION: Light touch: Impaired  and intermittent n/t in feet up to shins    COORDINATION: WFL   EDEMA:  Mild edema in bil LE   MUSCLE TONE: WFL   POSTURE: rounded shoulders and forward head  LOWER EXTREMITY ROM:     Grossly WFL  LOWER EXTREMITY MMT:    MMT Right Eval Left Eval  Hip flexion 4 4  Hip extension    Hip abduction 4+ 4+  Hip adduction 4 4  Hip internal rotation    Hip external rotation    Knee flexion 4+ 4+  Knee extension 4+ 4+  Ankle dorsiflexion 4+ 4+  Ankle plantarflexion    Ankle inversion    Ankle eversion    (Blank rows = not tested)  BED MOBILITY:  Sit to supine Complete Independence Supine to sit Complete Independence  TRANSFERS: Assistive device utilized: None  Sit to stand: Complete Independence Stand to sit: Complete Independence Chair to chair: Complete Independence Floor:  unable to complete .  Requires UE support topush from thighs to stand   RAMP:  Level of Assistance: Complete Independence Assistive device utilized:  Ramp Comments:   CURB:  Level of Assistance: Modified independence Assistive device utilized:  rail Curb Comments:   STAIRS: Level of  Assistance: SBA Stair Negotiation Technique: Step to Pattern Alternating Pattern  with Single Rail on Right  Number of Stairs: 4  Height of Stairs: 6  Comments: step to on descent  GAIT: Gait pattern: lateral hip instability, trunk flexed, and wide BOS Distance walked: 915 Assistive device utilized: None Level of assistance: Complete Independence Comments: increased flexed posture with increased distance  FUNCTIONAL TESTS:  5 times sit to stand: 19.11 with UE support; 21.44 with UE support on knees.  Timed up and go (TUG): 15.64, 13.74 sec  6 minute walk test: 935ft  10 meter walk test: 12.43 and 11.68 Berg Balance Scale: TBD Dynamic Gait Index: TBD  PATIENT SURVEYS:  ABC scale 60                                                                                                                              TREATMENT DATE: 05/12/2023    BERG: 38/56 DGI= 17/24   [facility] PT Assessment - 05/12/23 1031       Standardized Balance Assessment   Standardized Balance Assessment Berg Balance Test;Dynamic Gait Index      Berg Balance Test   Sit to Stand Able to stand without using hands and stabilize independently    Standing Unsupported Able to stand safely 2 minutes    Sitting with Back Unsupported but Feet Supported on Floor or Stool Able to sit safely and securely 2 minutes    Stand to Sit Controls descent by using hands    Transfers Able to transfer safely, definite need of hands    Standing Unsupported with Eyes Closed Needs help to keep from falling    Standing Unsupported with Feet Together Able to place feet together independently and stand for 1 minute with supervision    From Standing, Reach Forward with Outstretched Arm Can reach forward >12 cm safely (5")    From Standing Position, Pick up Object from Floor Able to pick up shoe, needs supervision    From Standing Position, Turn to Look Behind Over each Shoulder Turn sideways only but maintains balance    Turn 360 Degrees Able  to turn 360 degrees safely but slowly    Standing Unsupported, Alternately Place Feet on Step/Stool Able to stand independently and safely and complete 8 steps in 20 seconds    Standing Unsupported, One Foot in Front Able to take small step independently and hold 30 seconds    Standing on One Leg Tries to lift leg/unable to hold 3 seconds but remains standing independently    Total Score 38      Dynamic Gait Index   Level Surface Normal    Change in Gait Speed Mild Impairment    Gait with Horizontal Head Turns Moderate Impairment    Gait with Vertical Head Turns Normal    Gait and Pivot Turn Mild Impairment    Step Over Obstacle Moderate Impairment    Step Around Obstacles Normal    Steps Mild Impairment    Total Score 17  NMR: ADDED below listed HEP and PT demonstrated correct technique. Patient able to practice each exercise with some difficulty- instructed to hold onto kitchen sink at home for safety as needed.      PATIENT EDUCATION: Education details: Purpose of functional outcome testing and education in HEP Person educated: Patient Education method: Explanation Education comprehension: verbalized understanding  HOME EXERCISE PROGRAM: Access Code: URL: [email or web address] Date: 05/12/2023 Prepared by: Maureen Ralphs  Exercises - Standing Tandem Balance with Counter Support  - 3 x weekly - 3 sets - up to 30 sec hold - Standing March with Counter Support  - 3 x weekly - 3 sets - 10 reps - Standing Single Leg Stance with Counter Support  - 73 x weekly - 3-5 sets - as long as possible hold   GOALS: Goals reviewed with patient? Yes  SHORT TERM GOALS: Target date: 06/09/2023   Patient will be independent in home exercise program to improve strength/mobility for better functional independence with ADLs. Baseline: to be given visit 2  Goal status: INITIAL   LONG TERM GOALS: Target date: 08/04/2023    Patient will increase  ABC score to equal to or greater than 72  to demonstrate statistically significant improvement in mobility and quality of life.  Baseline: 60 Goal status: INITIAL  2.  Patient (> 45 years old) will complete five times sit to stand test in < 15 seconds indicating an increased LE strength and improved balance. Baseline: 21.44 Goal status: INITIAL  3.  Patient will increase Berg Balance score by > 6 points to demonstrate decreased fall risk during functional activities Baseline: to be completed; 1/30= 38/56 Goal status: INITIAL  4.  Patient will increase 10 meter walk test to >1.37m/s as to improve gait speed for better community ambulation and to reduce fall risk. Baseline: 0.83 m/s Goal status: INITIAL  5.  Patient will reduce timed up and go to <11 seconds to reduce fall risk and demonstrate improved transfer/gait ability. Baseline: 14 sec  Goal status: INITIAL  6.  Patient will increase dynamic gait index score to >19/24 as to demonstrate reduced fall risk and improved dynamic gait balance for better safety with community/home ambulation.   Baseline: TBD; 1/30= 17 Goal status: INITIAL  ASSESSMENT:  CLINICAL IMPRESSION: Treatment consisted of further assessment of balance per plan of care from initial evaluation. Patient presents with impaired overall balance scoring 38/56 on BERG and 17/24 on DGI- both indicating some dynamic balance deficits with increased risk of falling. He was issued some beginning balance activities and able to return good demonstration of all activities today. Will focus on static and dynamic balance along with some LE strengthening to improve his overall balance. Pt will benefit from skilled PT to address strength and balance deficits to reduce fallrisk, improve safety and improve overall QoL.   OBJECTIVE IMPAIRMENTS: Abnormal gait, cardiopulmonary status limiting activity, decreased activity tolerance, decreased balance, decreased coordination, decreased  endurance, decreased knowledge of use of DME, decreased mobility, difficulty walking, decreased ROM, decreased strength, hypomobility, impaired perceived functional ability, impaired flexibility, impaired sensation, improper body mechanics, postural dysfunction, and obesity.   ACTIVITY LIMITATIONS: carrying, lifting, bending, standing, squatting, stairs, transfers, and locomotion level  PARTICIPATION LIMITATIONS: cleaning, laundry, community activity, and occupation  PERSONAL FACTORS: Age and 1-2 comorbidities: obesity CHF  are also affecting patient's functional outcome.   REHAB POTENTIAL: Good  CLINICAL DECISION MAKING: Stable/uncomplicated  EVALUATION COMPLEXITY: Moderate  PLAN:  PT FREQUENCY: 1-2x/week  PT DURATION: 12 weeks  PLANNED INTERVENTIONS:  97110-Therapeutic exercises, 97530- Therapeutic activity, [id number]- Neuromuscular re-education, 97535- Self Care, [id number]- Manual therapy, [phone]- Gait training, Patient/Family education, Balance training, Stair training, Taping, DME instructions, Cryotherapy, and Moist heat  PLAN FOR NEXT SESSION:  LE strengthening Balance training Add to Balance HEP as appropriate.    Lenda Kelp, PT 05/12/2023, 11:24 AM

## 2023-05-17 ENCOUNTER — Ambulatory Visit: Payer: PPO | Attending: Internal Medicine

## 2023-05-17 DIAGNOSIS — R2681 Unsteadiness on feet: Secondary | ICD-10-CM | POA: Insufficient documentation

## 2023-05-17 DIAGNOSIS — R2689 Other abnormalities of gait and mobility: Secondary | ICD-10-CM | POA: Insufficient documentation

## 2023-05-17 NOTE — Therapy (Signed)
 OUTPATIENT PHYSICAL THERAPY NEURO TREATMENT   Patient Name: Anthony Weber MRN: [id number] DOB:Jun 13, 1945, 78 y.o., male Today's Date: 05/17/2023   PCP: Corlis Honor BROCKS, MD  REFERRING PROVIDER: Cherrie Toribio SAUNDERS, MD   END OF SESSION:  PT End of Session - 05/17/23 1530     Visit Number 3    Number of Visits 24    Date for PT Re-Evaluation 08/02/23    Progress Note Due on Visit 10    PT Start Time 1530    PT Stop Time 1615    PT Time Calculation (min) 45 min    Equipment Utilized During Treatment Gait belt    Activity Tolerance Patient tolerated treatment well    Behavior During Therapy WFL for tasks assessed/performed             Past Medical History:  Diagnosis Date   Depression    Hypertension    Hypothyroidism    Past Surgical History:  Procedure Laterality Date   COLONOSCOPY WITH PROPOFOL  N/A 03/03/2015   Procedure: COLONOSCOPY WITH PROPOFOL ;  Surgeon: Lamar ONEIDA Holmes, MD;  Location: [facility] ENDOSCOPY;  Service: Endoscopy;  Laterality: N/A;   EYE SURGERY     RIGHT/LEFT HEART CATH AND CORONARY ANGIOGRAPHY N/A 01/27/2021   Procedure: RIGHT/LEFT HEART CATH AND CORONARY ANGIOGRAPHY;  Surgeon: Mady Bruckner, MD;  Location: [facility] INVASIVE CV LAB;  Service: Cardiovascular;  Laterality: N/A;   TEE WITHOUT CARDIOVERSION N/A 02/05/2021   Procedure: TRANSESOPHAGEAL ECHOCARDIOGRAM (TEE);  Surgeon: Cherrie Toribio SAUNDERS, MD;  Location: [facility] ENDOSCOPY;  Service: Cardiovascular;  Laterality: N/A;   Patient Active Problem List   Diagnosis Date Noted   Hyponatremia    Acute on chronic combined systolic and diastolic congestive heart failure (HCC)    Acute on chronic respiratory failure with hypoxia and hypercapnia (HCC)    MSSA bacteremia 02/03/2021   Central line infection    Pneumonia of both lower lobes due to methicillin susceptible Staphylococcus aureus (MSSA) (HCC)    Cardiogenic shock (HCC) 01/29/2021   Primary hypertension    Acute HFrEF (heart failure with  reduced ejection fraction) (HCC)    Acute CHF (congestive heart failure) (HCC) 01/18/2021    ONSET DATE: 3 years.   REFERRING DIAG:  Diagnosis  R26.89 (ICD-10-CM) - Imbalance    THERAPY DIAG:  Unsteadiness on feet  Imbalance  Balance disorder  Rationale for Evaluation and Treatment: Rehabilitation  SUBJECTIVE:  SUBJECTIVE STATEMENT:  Pt reports he has been doing his exercises, just notes that he has no balance and requires UE support to perform without falling.  Pt denies any falls since being seen last.  Pt also denies any pain, just reporting being lazy.  Pt accompanied by: self  PERTINENT HISTORY:   From recent MD appointment. Admitted to [facility] 01/2021  acute HFrEF. Echo  EF 25-30% from previous EF 60-65%. R/LHC w/ no CAD, moderately elevated left heart and pulmonary artery pressures, severely elevated right heart filling pressures, severely reduced Fick cardiac output/index.  Required intropes, pressor, and lasix  drip.  Transferred to [facility] for cardiogenic/septic shock, + MSSA bacteremia. Underwent TEE showing EF 25-30%, RV moderately down, severe biatrial enlargement and moderate MR, no valvular vegetation. Required addition of mexitiline to suppress PVCs. Hospitalization c/b AKI and hypoxic/hypercarbic respiratory failure.   Discharged from [facility] 03/11/21 after he completed IV antibiotics and rehab.    At last visit midodrine  stopped and GDMT started.    Sleep study 4/23 AHI 2   Here for routine f/u. Here for f/u with his wife. Says he is doing pretty well. 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 Walking 1 mile at the [place] almost everyday. And riding stationary bike for 30 mins every night.  PAIN:  Are you having pain? No  PRECAUTIONS: None  RED FLAGS: None   WEIGHT BEARING  RESTRICTIONS: No  FALLS: Has patient fallen in last 6 months? No  LIVING ENVIRONMENT: Lives with: lives with their spouse Lives in: House/apartment Stairs: Yes: Internal: 12 steps; on right going up and on left going up and External: 1 steps; on right going up and on left going up Has following equipment at home: Single point cane and does not use often    PLOF: Independent, Independent with basic ADLs, Independent with gait, and Independent with transfers  PATIENT GOALS: improve balance and strength in legs.   OBJECTIVE:  Note: Objective measures were completed at Evaluation unless otherwise noted.  DIAGNOSTIC FINDINGS:  None relevant   COGNITION: Overall cognitive status: Within functional limits for tasks assessed   SENSATION: Light touch: Impaired  and intermittent n/t in feet up to shins    COORDINATION: WFL   EDEMA:  Mild edema in bil LE   MUSCLE TONE: WFL   POSTURE: rounded shoulders and forward head  LOWER EXTREMITY ROM:     Grossly WFL  LOWER EXTREMITY MMT:    MMT Right Eval Left Eval  Hip flexion 4 4  Hip extension    Hip abduction 4+ 4+  Hip adduction 4 4  Hip internal rotation    Hip external rotation    Knee flexion 4+ 4+  Knee extension 4+ 4+  Ankle dorsiflexion 4+ 4+  Ankle plantarflexion    Ankle inversion    Ankle eversion    (Blank rows = not tested)  BED MOBILITY:  Sit to supine Complete Independence Supine to sit Complete Independence  TRANSFERS: Assistive device utilized: None  Sit to stand: Complete Independence Stand to sit: Complete Independence Chair to chair: Complete Independence Floor:  unable to complete .  Requires UE support topush from thighs to stand   RAMP:  Level of Assistance: Complete Independence Assistive device utilized:  Ramp Comments:   CURB:  Level of Assistance: Modified independence Assistive device utilized:  rail Curb Comments:   STAIRS: Level of Assistance: SBA Stair Negotiation  Technique: Step to Pattern Alternating Pattern  with Single Rail on Right Number of Stairs:  4  Height of Stairs: 6  Comments: step to on descent  GAIT: Gait pattern: lateral hip instability, trunk flexed, and wide BOS Distance walked: 915 Assistive device utilized: None Level of assistance: Complete Independence Comments: increased flexed posture with increased distance  FUNCTIONAL TESTS:  5 times sit to stand: 19.11 with UE support; 21.44 with UE support on knees.  Timed up and go (TUG): 15.64, 13.74 sec  6 minute walk test: 948ft  10 meter walk test: 12.43 and 11.68 Berg Balance Scale: TBD Dynamic Gait Index: TBD  PATIENT SURVEYS:  ABC scale 60                                                                                                                              TREATMENT DATE: DATE: 05/17/2023  TherAct:  STS, x10 Seated LAQ, 3# AW donned, 2x10 each LE Seated marches with back away from chair, 3# AW donned, 2x10 each LE Seated hip adduction squeeze into rainbow physioball, 3 sec holds, 2x10 Standing hip abduction with 3# AW donned, x10 each LE Standing hamstring curls with 3# AW donned, x10 each LE Standing donkey kicks with 3# AW donned, x10 each LE Standing calf raises with 3# AW donned, x10   Neuro:  Ambulation in the hallway with lateral head turns, x length of hallway Ambulation in the hallway with vertical head turns, x length of hallway Ambulation in the hallway with lateral head turns and sticky note recall, x length of hallway Backwards ambulation for improved balance and stability, x length of hallway     PATIENT EDUCATION: Education details: Purpose of functional outcome testing and education in HEP Person educated: Patient Education method: Explanation Education comprehension: verbalized understanding  HOME EXERCISE PROGRAM: Access Code: URL: [email or web address] Date: 05/12/2023 Prepared by: Reyes London  Exercises - Standing Tandem Balance with Counter Support  - 3 x weekly - 3 sets - up to 30 sec hold - Standing March with Counter Support  - 3 x weekly - 3 sets - 10 reps - Standing Single Leg Stance with Counter Support  - 73 x weekly - 3-5 sets - as long as possible hold   GOALS: Goals reviewed with patient? Yes  SHORT TERM GOALS: Target date: 06/09/2023  Patient will be independent in home exercise program to improve strength/mobility for better functional independence with ADLs. Baseline: to be given visit 2  Goal status: INITIAL   LONG TERM GOALS: Target date: 08/04/2023  Patient will increase ABC score to equal to or greater than 72  to demonstrate statistically significant improvement in mobility and quality of life.  Baseline: 60 Goal status: INITIAL  2.  Patient (> 79 years old) will complete five times sit to stand test in < 15 seconds indicating an increased LE strength and improved balance. Baseline: 21.44 Goal status: INITIAL  3.  Patient will increase Berg Balance score by > 6 points to demonstrate decreased fall risk during functional activities  Baseline: 1/30= 38/56 Goal status: INITIAL  4.  Patient will increase 10 meter walk test to >1.22m/s as to improve gait speed for better community ambulation and to reduce fall risk. Baseline: 0.83 m/s Goal status: INITIAL  5.  Patient will reduce timed up and go to <11 seconds to reduce fall risk and demonstrate improved transfer/gait ability. Baseline: 14 sec  Goal status: INITIAL  6.  Patient will increase dynamic gait index score to >19/24 as to demonstrate reduced fall risk and improved dynamic gait balance for better safety with community/home ambulation.   Baseline: 05/12/23: 17 Goal status: INITIAL    ASSESSMENT:  CLINICAL IMPRESSION:  Pt responded well to the exercises and put forth great effort throughout the session.  Pt had several questions and concerns regarding AD's and use of stocking,  by which therapist made sure to answer all questions regarding these AD's.  Pt also participated in newly introduced intrinsic foot exercises in order to improve overall tolerance to mobility and improve the muscle-brain connection to exercises.   Pt will continue to benefit from skilled therapy to address remaining deficits in order to improve overall QoL and return to PLOF.       OBJECTIVE IMPAIRMENTS: Abnormal gait, cardiopulmonary status limiting activity, decreased activity tolerance, decreased balance, decreased coordination, decreased endurance, decreased knowledge of use of DME, decreased mobility, difficulty walking, decreased ROM, decreased strength, hypomobility, impaired perceived functional ability, impaired flexibility, impaired sensation, improper body mechanics, postural dysfunction, and obesity.   ACTIVITY LIMITATIONS: carrying, lifting, bending, standing, squatting, stairs, transfers, and locomotion level  PARTICIPATION LIMITATIONS: cleaning, laundry, community activity, and occupation  PERSONAL FACTORS: Age and 1-2 comorbidities: obesity CHF  are also affecting patient's functional outcome.   REHAB POTENTIAL: Good  CLINICAL DECISION MAKING: Stable/uncomplicated  EVALUATION COMPLEXITY: Moderate  PLAN:  PT FREQUENCY: 1-2x/week  PT DURATION: 12 weeks  PLANNED INTERVENTIONS: 97110-Therapeutic exercises, 97530- Therapeutic activity, [id number]- Neuromuscular re-education, 97535- Self Care, [id number]- Manual therapy, [phone]- Gait training, Patient/Family education, Balance training, Stair training, Taping, DME instructions, Cryotherapy, and Moist heat  PLAN FOR NEXT SESSION:  LE strengthening Balance training Add to Balance HEP as appropriate.     Fonda Simpers, PT, DPT Physical Therapist - [facility]  05/17/23, 5:34 PM

## 2023-05-24 ENCOUNTER — Ambulatory Visit: Payer: PPO

## 2023-05-24 DIAGNOSIS — R2689 Other abnormalities of gait and mobility: Secondary | ICD-10-CM

## 2023-05-24 DIAGNOSIS — R2681 Unsteadiness on feet: Secondary | ICD-10-CM | POA: Diagnosis not present

## 2023-05-24 NOTE — Therapy (Signed)
OUTPATIENT PHYSICAL THERAPY NEURO TREATMENT   Patient Name: Anthony Weber MRN: [id number] DOB:09/04/1945, 78 y.o., male Today's Date: 05/25/2023   PCP: Jaclyn Shaggy, MD  REFERRING PROVIDER: Dolores Patty, MD   END OF SESSION:  PT End of Session - 05/24/23 1619     Visit Number 4    Number of Visits 24    Date for PT Re-Evaluation 08/02/23    Progress Note Due on Visit 10    PT Start Time 1616    PT Stop Time 1700    PT Time Calculation (min) 44 min    Equipment Utilized During Treatment Gait belt    Activity Tolerance Patient tolerated treatment well    Behavior During Therapy WFL for tasks assessed/performed              Past Medical History:  Diagnosis Date   Depression    Hypertension    Hypothyroidism    Past Surgical History:  Procedure Laterality Date   COLONOSCOPY WITH PROPOFOL N/A 03/03/2015   Procedure: COLONOSCOPY WITH PROPOFOL;  Surgeon: Scot Jun, MD;  Location: [facility] ENDOSCOPY;  Service: Endoscopy;  Laterality: N/A;   EYE SURGERY     RIGHT/LEFT HEART CATH AND CORONARY ANGIOGRAPHY N/A 01/27/2021   Procedure: RIGHT/LEFT HEART CATH AND CORONARY ANGIOGRAPHY;  Surgeon: Yvonne Kendall, MD;  Location: [facility] INVASIVE CV LAB;  Service: Cardiovascular;  Laterality: N/A;   TEE WITHOUT CARDIOVERSION N/A 02/05/2021   Procedure: TRANSESOPHAGEAL ECHOCARDIOGRAM (TEE);  Surgeon: Dolores Patty, MD;  Location: [facility] ENDOSCOPY;  Service: Cardiovascular;  Laterality: N/A;   Patient Active Problem List   Diagnosis Date Noted   Hyponatremia    Acute on chronic combined systolic and diastolic congestive heart failure (HCC)    Acute on chronic respiratory failure with hypoxia and hypercapnia (HCC)    MSSA bacteremia 02/03/2021   Central line infection    Pneumonia of both lower lobes due to methicillin susceptible Staphylococcus aureus (MSSA) (HCC)    Cardiogenic shock (HCC) 01/29/2021   Primary hypertension    Acute HFrEF (heart failure with  reduced ejection fraction) (HCC)    Acute CHF (congestive heart failure) (HCC) 01/18/2021    ONSET DATE: 3 years.   REFERRING DIAG:  Diagnosis  R26.89 (ICD-10-CM) - Imbalance    THERAPY DIAG:  Unsteadiness on feet  Imbalance  Balance disorder  Rationale for Evaluation and Treatment: Rehabilitation  SUBJECTIVE:  SUBJECTIVE STATEMENT:  Pt reports no issues since last visit. States almost didn't come today with the bad rainy weather.   Pt accompanied by: self  PERTINENT HISTORY:   From recent MD appointment. "Admitted to [facility] 01/2021  acute HFrEF. Echo  EF 25-30% from previous EF 60-65%. R/LHC w/ no CAD, moderately elevated left heart and pulmonary artery pressures, severely elevated right heart filling pressures, severely reduced Fick cardiac output/index.  Required intropes, pressor, and lasix drip.  Transferred to [facility] for cardiogenic/septic shock, + MSSA bacteremia. Underwent TEE showing EF 25-30%, RV moderately down, severe biatrial enlargement and moderate MR, no valvular vegetation. Required addition of mexitiline to suppress PVCs. Hospitalization c/b AKI and hypoxic/hypercarbic respiratory failure.   Discharged from [facility] 03/11/21 after he completed IV antibiotics and rehab.    At last visit midodrine stopped and GDMT started.    Sleep study 4/23 AHI 2   Here for routine f/u. Here for f/u with his wife. Says he is doing pretty well. 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 Walking 1 mile at the [place] almost everyday. And riding stationary bike for 30 mins every night."  PAIN:  Are you having pain? No  PRECAUTIONS: None  RED FLAGS: None   WEIGHT BEARING RESTRICTIONS: No  FALLS: Has patient fallen in last 6 months? No  LIVING ENVIRONMENT: Lives with: lives with their spouse Lives  in: House/apartment Stairs: Yes: Internal: 12 steps; on right going up and on left going up and External: 1 steps; on right going up and on left going up Has following equipment at home: Single point cane and does not use often    PLOF: Independent, Independent with basic ADLs, Independent with gait, and Independent with transfers  PATIENT GOALS: improve balance and strength in legs.   OBJECTIVE:  Note: Objective measures were completed at Evaluation unless otherwise noted.  DIAGNOSTIC FINDINGS:  None relevant   COGNITION: Overall cognitive status: Within functional limits for tasks assessed   SENSATION: Light touch: Impaired  and intermittent n/t in feet up to shins    COORDINATION: WFL   EDEMA:  Mild edema in bil LE   MUSCLE TONE: WFL   POSTURE: rounded shoulders and forward head  LOWER EXTREMITY ROM:     Grossly WFL  LOWER EXTREMITY MMT:    MMT Right Eval Left Eval  Hip flexion 4 4  Hip extension    Hip abduction 4+ 4+  Hip adduction 4 4  Hip internal rotation    Hip external rotation    Knee flexion 4+ 4+  Knee extension 4+ 4+  Ankle dorsiflexion 4+ 4+  Ankle plantarflexion    Ankle inversion    Ankle eversion    (Blank rows = not tested)  BED MOBILITY:  Sit to supine Complete Independence Supine to sit Complete Independence  TRANSFERS: Assistive device utilized: None  Sit to stand: Complete Independence Stand to sit: Complete Independence Chair to chair: Complete Independence Floor:  unable to complete .  Requires UE support topush from thighs to stand   RAMP:  Level of Assistance: Complete Independence Assistive device utilized:  Ramp Comments:   CURB:  Level of Assistance: Modified independence Assistive device utilized:  rail Curb Comments:   STAIRS: Level of Assistance: SBA Stair Negotiation Technique: Step to Pattern Alternating Pattern  with Single Rail on Right Number of Stairs: 4  Height of Stairs: 6  Comments: step to on  descent  GAIT: Gait pattern: lateral hip instability, trunk flexed, and wide BOS  Distance walked: 915 Assistive device utilized: None Level of assistance: Complete Independence Comments: increased flexed posture with increased distance  FUNCTIONAL TESTS:  5 times sit to stand: 19.11 with UE support; 21.44 with UE support on knees.  Timed up and go (TUG): 15.64, 13.74 sec  6 minute walk test: 950ft  10 meter walk test: 12.43 and 11.68 Berg Balance Scale: TBD Dynamic Gait Index: TBD  PATIENT SURVEYS:  ABC scale 60                                                                                                                              TREATMENT DATE: DATE: 05/24/2023  TherAct:  STS, without UE support 2 x10 Seated LAQ, 3# AW donned Seated marches with back away from chair, 3# AW donned, 2x10 each LE. Step up with 3# AW alt LE x 15 reps with BUE support Standing hip abduction with 3# AW donned, 2 sets x10 each LE  NMR:  Resistive gait wearing 3# AW- No AD, CGA- Short reciprocal steps x 160 feet.  Forward step up/over 1/2 foam rolls x 3 in // bars - focusing on step length and picking feet up - wearing 3# AW x 6 - down and back.  High knee march walk  with 3# AW in // bars - down and back without UE x 6 trips  Retro gait 3# AW in bars - down and back x 4 (focusing on increasing length of steps  Tandem standing (3-10 sec) x multiple attempts each direction            PATIENT EDUCATION: Education details: Purpose of functional outcome testing and education in HEP Person educated: Patient Education method: Explanation Education comprehension: verbalized understanding  HOME EXERCISE PROGRAM: Access Code: URL: [email or web address] Date: 05/12/2023 Prepared by: Maureen Ralphs  Exercises - Standing Tandem Balance with Counter Support  - 3 x weekly - 3 sets - up to 30 sec hold - Standing March with Counter Support  - 3 x weekly - 3  sets - 10 reps - Standing Single Leg Stance with Counter Support  - 73 x weekly - 3-5 sets - as long as possible hold   GOALS: Goals reviewed with patient? Yes  SHORT TERM GOALS: Target date: 06/09/2023  Patient will be independent in home exercise program to improve strength/mobility for better functional independence with ADLs. Baseline: to be given visit 2  Goal status: INITIAL   LONG TERM GOALS: Target date: 08/04/2023  Patient will increase ABC score to equal to or greater than 72  to demonstrate statistically significant improvement in mobility and quality of life.  Baseline: 60 Goal status: INITIAL  2.  Patient (> 14 years old) will complete five times sit to stand test in < 15 seconds indicating an increased LE strength and improved balance. Baseline: 21.44 Goal status: INITIAL  3.  Patient will increase Berg Balance score by > 6 points to demonstrate decreased fall risk  during functional activities Baseline: 1/30= 38/56 Goal status: INITIAL  4.  Patient will increase 10 meter walk test to >1.105m/s as to improve gait speed for better community ambulation and to reduce fall risk. Baseline: 0.83 m/s Goal status: INITIAL  5.  Patient will reduce timed up and go to <11 seconds to reduce fall risk and demonstrate improved transfer/gait ability. Baseline: 14 sec  Goal status: INITIAL  6.  Patient will increase dynamic gait index score to >19/24 as to demonstrate reduced fall risk and improved dynamic gait balance for better safety with community/home ambulation.   Baseline: 05/12/23: 17 Goal status: INITIAL    ASSESSMENT:  CLINICAL IMPRESSION:  Patient remained motivated and performed well with increased resistance today. He was challenged with tandem standing and instructed to practice at home. He was fatigued but recovers quick during session- asking "what's next" He had some in session improvement with foot clearance with stepping over obstacles.  Pt will continue to  benefit from skilled therapy to address remaining deficits in order to improve overall QoL and return to PLOF.       OBJECTIVE IMPAIRMENTS: Abnormal gait, cardiopulmonary status limiting activity, decreased activity tolerance, decreased balance, decreased coordination, decreased endurance, decreased knowledge of use of DME, decreased mobility, difficulty walking, decreased ROM, decreased strength, hypomobility, impaired perceived functional ability, impaired flexibility, impaired sensation, improper body mechanics, postural dysfunction, and obesity.   ACTIVITY LIMITATIONS: carrying, lifting, bending, standing, squatting, stairs, transfers, and locomotion level  PARTICIPATION LIMITATIONS: cleaning, laundry, community activity, and occupation  PERSONAL FACTORS: Age and 1-2 comorbidities: obesity CHF  are also affecting patient's functional outcome.   REHAB POTENTIAL: Good  CLINICAL DECISION MAKING: Stable/uncomplicated  EVALUATION COMPLEXITY: Moderate  PLAN:  PT FREQUENCY: 1-2x/week  PT DURATION: 12 weeks  PLANNED INTERVENTIONS: 97110-Therapeutic exercises, 97530- Therapeutic activity, [id number]- Neuromuscular re-education, 97535- Self Care, 91478- Manual therapy, [id number]- Gait training, Patient/Family education, Balance training, Stair training, Taping, DME instructions, Cryotherapy, and Moist heat  PLAN FOR NEXT SESSION:  LE strengthening Balance training Add to Balance HEP as appropriate.     Louis Meckel, PT Physical Therapist - [facility]  05/25/23, 9:25 AM

## 2023-05-25 ENCOUNTER — Ambulatory Visit: Payer: PPO | Admitting: Physical Therapy

## 2023-05-26 ENCOUNTER — Ambulatory Visit: Payer: PPO | Admitting: Physical Therapy

## 2023-05-26 DIAGNOSIS — R2681 Unsteadiness on feet: Secondary | ICD-10-CM

## 2023-05-26 DIAGNOSIS — R2689 Other abnormalities of gait and mobility: Secondary | ICD-10-CM

## 2023-05-26 NOTE — Therapy (Signed)
OUTPATIENT PHYSICAL THERAPY NEURO TREATMENT   Patient Name: Anthony Weber MRN: [id number] DOB:1946-03-30, 78 y.o., male Today's Date: 05/26/2023   PCP: Jaclyn Shaggy, MD  REFERRING PROVIDER: Dolores Patty, MD   END OF SESSION:  PT End of Session - 05/26/23 1049     Visit Number 5    Number of Visits 24    Date for PT Re-Evaluation 08/02/23    Progress Note Due on Visit 10    PT Start Time 1100    PT Stop Time 1139    PT Time Calculation (min) 39 min    Equipment Utilized During Treatment Gait belt    Activity Tolerance Patient tolerated treatment well    Behavior During Therapy WFL for tasks assessed/performed              Past Medical History:  Diagnosis Date   Depression    Hypertension    Hypothyroidism    Past Surgical History:  Procedure Laterality Date   COLONOSCOPY WITH PROPOFOL N/A 03/03/2015   Procedure: COLONOSCOPY WITH PROPOFOL;  Surgeon: Scot Jun, MD;  Location: [facility] ENDOSCOPY;  Service: Endoscopy;  Laterality: N/A;   EYE SURGERY     RIGHT/LEFT HEART CATH AND CORONARY ANGIOGRAPHY N/A 01/27/2021   Procedure: RIGHT/LEFT HEART CATH AND CORONARY ANGIOGRAPHY;  Surgeon: Yvonne Kendall, MD;  Location: [facility] INVASIVE CV LAB;  Service: Cardiovascular;  Laterality: N/A;   TEE WITHOUT CARDIOVERSION N/A 02/05/2021   Procedure: TRANSESOPHAGEAL ECHOCARDIOGRAM (TEE);  Surgeon: Dolores Patty, MD;  Location: [facility] ENDOSCOPY;  Service: Cardiovascular;  Laterality: N/A;   Patient Active Problem List   Diagnosis Date Noted   Hyponatremia    Acute on chronic combined systolic and diastolic congestive heart failure (HCC)    Acute on chronic respiratory failure with hypoxia and hypercapnia (HCC)    MSSA bacteremia 02/03/2021   Central line infection    Pneumonia of both lower lobes due to methicillin susceptible Staphylococcus aureus (MSSA) (HCC)    Cardiogenic shock (HCC) 01/29/2021   Primary hypertension    Acute HFrEF (heart failure with  reduced ejection fraction) (HCC)    Acute CHF (congestive heart failure) (HCC) 01/18/2021    ONSET DATE: 3 years.   REFERRING DIAG:  Diagnosis  R26.89 (ICD-10-CM) - Imbalance    THERAPY DIAG:  Unsteadiness on feet  Imbalance  Balance disorder  Rationale for Evaluation and Treatment: Rehabilitation  SUBJECTIVE:  SUBJECTIVE STATEMENT:  Pt reports no issues since last visit. States he has been doing well. 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   Pt accompanied by: self  PERTINENT HISTORY:   From recent MD appointment. "Admitted to [facility] 01/2021  acute HFrEF. Echo  EF 25-30% from previous EF 60-65%. R/LHC w/ no CAD, moderately elevated left heart and pulmonary artery pressures, severely elevated right heart filling pressures, severely reduced Fick cardiac output/index.  Required intropes, pressor, and lasix drip.  Transferred to [facility] for cardiogenic/septic shock, + MSSA bacteremia. Underwent TEE showing EF 25-30%, RV moderately down, severe biatrial enlargement and moderate MR, no valvular vegetation. Required addition of mexitiline to suppress PVCs. Hospitalization c/b AKI and hypoxic/hypercarbic respiratory failure.   Discharged from [facility] 03/11/21 after he completed IV antibiotics and rehab.    At last visit midodrine stopped and GDMT started.    Sleep study 4/23 AHI 2   Here for routine f/u. Here for f/u with his wife. Says he is doing pretty well. 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 Walking 1 mile at the [place] almost everyday. And riding stationary bike for 30 mins every night."  PAIN:  Are you having pain? No  PRECAUTIONS: None  RED FLAGS: None   WEIGHT BEARING RESTRICTIONS: No  FALLS: Has patient fallen in last 6 months? No  LIVING ENVIRONMENT: Lives with: lives with  their spouse Lives in: House/apartment Stairs: Yes: Internal: 12 steps; on right going up and on left going up and External: 1 steps; on right going up and on left going up Has following equipment at home: Single point cane and does not use often    PLOF: Independent, Independent with basic ADLs, Independent with gait, and Independent with transfers  PATIENT GOALS: improve balance and strength in legs.   OBJECTIVE:  Note: Objective measures were completed at Evaluation unless otherwise noted.  DIAGNOSTIC FINDINGS:  None relevant   COGNITION: Overall cognitive status: Within functional limits for tasks assessed   SENSATION: Light touch: Impaired  and intermittent n/t in feet up to shins    COORDINATION: WFL   EDEMA:  Mild edema in bil LE   MUSCLE TONE: WFL   POSTURE: rounded shoulders and forward head  LOWER EXTREMITY ROM:     Grossly WFL  LOWER EXTREMITY MMT:    MMT Right Eval Left Eval  Hip flexion 4 4  Hip extension    Hip abduction 4+ 4+  Hip adduction 4 4  Hip internal rotation    Hip external rotation    Knee flexion 4+ 4+  Knee extension 4+ 4+  Ankle dorsiflexion 4+ 4+  Ankle plantarflexion    Ankle inversion    Ankle eversion    (Blank rows = not tested)  BED MOBILITY:  Sit to supine Complete Independence Supine to sit Complete Independence  TRANSFERS: Assistive device utilized: None  Sit to stand: Complete Independence Stand to sit: Complete Independence Chair to chair: Complete Independence Floor:  unable to complete .  Requires UE support topush from thighs to stand   RAMP:  Level of Assistance: Complete Independence Assistive device utilized:  Ramp Comments:   CURB:  Level of Assistance: Modified independence Assistive device utilized:  rail Curb Comments:   STAIRS: Level of Assistance: SBA Stair Negotiation Technique: Step to Pattern Alternating Pattern  with Single Rail on Right Number of Stairs: 4  Height of Stairs:  6  Comments: step to on descent  GAIT: Gait pattern: lateral hip instability, trunk  flexed, and wide BOS Distance walked: 915 Assistive device utilized: None Level of assistance: Complete Independence Comments: increased flexed posture with increased distance  FUNCTIONAL TESTS:  5 times sit to stand: 19.11 with UE support; 21.44 with UE support on knees.  Timed up and go (TUG): 15.64, 13.74 sec  6 minute walk test: 944ft  10 meter walk test: 12.43 and 11.68 Berg Balance Scale: TBD Dynamic Gait Index: TBD  PATIENT SURVEYS:  ABC scale 60                                                                                                                              TREATMENT DATE: DATE: 05/24/2023  TherAct:  STS, without UE support  x12 Seated LAQ, 3# AW donned x 12 ea LE  STS no UE support, x 12 reps   TE Seated LAQ, 3# AW donned x 12 ea LE Standing march with 3# AW and forward progression   Gait training Gait with focus on march first lap and step length second lap. Pt with decreased eccentric control on the LLE with initial contact.   TA Lateral side stepping in // bars x 4 laps  - added 1/2 foam rollers for another round of 4 on ea side to challenge coordination and foot clearance further   Lateral step up with heavy UE assist ( instruction to minimize UE support) 2 x 10 ea side    PATIENT EDUCATION: Education details: Purpose of functional outcome testing and education in HEP Person educated: Patient Education method: Explanation Education comprehension: verbalized understanding  HOME EXERCISE PROGRAM: Access Code: URL: [email or web address] Date: 05/12/2023 Prepared by: Maureen Ralphs  Exercises - Standing Tandem Balance with Counter Support  - 3 x weekly - 3 sets - up to 30 sec hold - Standing March with Counter Support  - 3 x weekly - 3 sets - 10 reps - Standing Single Leg Stance with Counter Support  - 73 x weekly - 3-5 sets - as  long as possible hold   GOALS: Goals reviewed with patient? Yes  SHORT TERM GOALS: Target date: 06/09/2023  Patient will be independent in home exercise program to improve strength/mobility for better functional independence with ADLs. Baseline: to be given visit 2  Goal status: INITIAL   LONG TERM GOALS: Target date: 08/04/2023  Patient will increase ABC score to equal to or greater than 72  to demonstrate statistically significant improvement in mobility and quality of life.  Baseline: 60 Goal status: INITIAL  2.  Patient (> 52 years old) will complete five times sit to stand test in < 15 seconds indicating an increased LE strength and improved balance. Baseline: 21.44 Goal status: INITIAL  3.  Patient will increase Berg Balance score by > 6 points to demonstrate decreased fall risk during functional activities Baseline: 1/30= 38/56 Goal status: INITIAL  4.  Patient will increase 10 meter walk test to >1.2m/s as to improve gait speed for better  community ambulation and to reduce fall risk. Baseline: 0.83 m/s Goal status: INITIAL  5.  Patient will reduce timed up and go to <11 seconds to reduce fall risk and demonstrate improved transfer/gait ability. Baseline: 14 sec  Goal status: INITIAL  6.  Patient will increase dynamic gait index score to >19/24 as to demonstrate reduced fall risk and improved dynamic gait balance for better safety with community/home ambulation.   Baseline: 05/12/23: 17 Goal status: INITIAL    ASSESSMENT:  CLINICAL IMPRESSION:  Patient arrived with good motivation for completion of pt activities.   Pt challenged with frontal plane strength and balance activities as well as dynamic walking and gait training activities. Pt has trouble with NBOS when ambulating and toe out gait. Pt will continue to benefit from skilled physical therapy intervention to address impairments, improve QOL, and attain therapy goals.       OBJECTIVE IMPAIRMENTS:  Abnormal gait, cardiopulmonary status limiting activity, decreased activity tolerance, decreased balance, decreased coordination, decreased endurance, decreased knowledge of use of DME, decreased mobility, difficulty walking, decreased ROM, decreased strength, hypomobility, impaired perceived functional ability, impaired flexibility, impaired sensation, improper body mechanics, postural dysfunction, and obesity.   ACTIVITY LIMITATIONS: carrying, lifting, bending, standing, squatting, stairs, transfers, and locomotion level  PARTICIPATION LIMITATIONS: cleaning, laundry, community activity, and occupation  PERSONAL FACTORS: Age and 1-2 comorbidities: obesity CHF  are also affecting patient's functional outcome.   REHAB POTENTIAL: Good  CLINICAL DECISION MAKING: Stable/uncomplicated  EVALUATION COMPLEXITY: Moderate  PLAN:  PT FREQUENCY: 1-2x/week  PT DURATION: 12 weeks  PLANNED INTERVENTIONS: 97110-Therapeutic exercises, 97530- Therapeutic activity, [id number]- Neuromuscular re-education, 97535- Self Care, [id number]- Manual therapy, [phone]- Gait training, Patient/Family education, Balance training, Stair training, Taping, DME instructions, Cryotherapy, and Moist heat  PLAN FOR NEXT SESSION:  LE strengthening Balance training Add to Balance HEP as appropriate.     Norman Herrlich PT ,DPT Physical Therapist- [facility]  05/26/23, 1:27 PM

## 2023-05-27 ENCOUNTER — Ambulatory Visit: Payer: PPO | Admitting: Physical Therapy

## 2023-05-31 ENCOUNTER — Ambulatory Visit: Payer: PPO | Admitting: Physical Therapy

## 2023-05-31 DIAGNOSIS — R2681 Unsteadiness on feet: Secondary | ICD-10-CM

## 2023-05-31 DIAGNOSIS — R2689 Other abnormalities of gait and mobility: Secondary | ICD-10-CM

## 2023-05-31 NOTE — Therapy (Signed)
OUTPATIENT PHYSICAL THERAPY NEURO TREATMENT   Patient Name: Anthony Weber MRN: [id number] DOB:Jun 19, 1945, 78 y.o., male Today's Date: 05/31/2023   PCP: Jaclyn Shaggy, MD  REFERRING PROVIDER: Dolores Patty, MD   END OF SESSION:  PT End of Session - 05/31/23 1531     Visit Number 6    Number of Visits 24    Date for PT Re-Evaluation 08/02/23    Progress Note Due on Visit 10    PT Start Time 1533    PT Stop Time 1615    PT Time Calculation (min) 42 min    Equipment Utilized During Treatment Gait belt    Activity Tolerance Patient tolerated treatment well    Behavior During Therapy WFL for tasks assessed/performed              Past Medical History:  Diagnosis Date   Depression    Hypertension    Hypothyroidism    Past Surgical History:  Procedure Laterality Date   COLONOSCOPY WITH PROPOFOL N/A 03/03/2015   Procedure: COLONOSCOPY WITH PROPOFOL;  Surgeon: Scot Jun, MD;  Location: [facility] ENDOSCOPY;  Service: Endoscopy;  Laterality: N/A;   EYE SURGERY     RIGHT/LEFT HEART CATH AND CORONARY ANGIOGRAPHY N/A 01/27/2021   Procedure: RIGHT/LEFT HEART CATH AND CORONARY ANGIOGRAPHY;  Surgeon: Yvonne Kendall, MD;  Location: [facility] INVASIVE CV LAB;  Service: Cardiovascular;  Laterality: N/A;   TEE WITHOUT CARDIOVERSION N/A 02/05/2021   Procedure: TRANSESOPHAGEAL ECHOCARDIOGRAM (TEE);  Surgeon: Dolores Patty, MD;  Location: [facility] ENDOSCOPY;  Service: Cardiovascular;  Laterality: N/A;   Patient Active Problem List   Diagnosis Date Noted   Hyponatremia    Acute on chronic combined systolic and diastolic congestive heart failure (HCC)    Acute on chronic respiratory failure with hypoxia and hypercapnia (HCC)    MSSA bacteremia 02/03/2021   Central line infection    Pneumonia of both lower lobes due to methicillin susceptible Staphylococcus aureus (MSSA) (HCC)    Cardiogenic shock (HCC) 01/29/2021   Primary hypertension    Acute HFrEF (heart failure with  reduced ejection fraction) (HCC)    Acute CHF (congestive heart failure) (HCC) 01/18/2021    ONSET DATE: 3 years.   REFERRING DIAG:  Diagnosis  R26.89 (ICD-10-CM) - Imbalance    THERAPY DIAG:  Unsteadiness on feet  Imbalance  Balance disorder  Rationale for Evaluation and Treatment: Rehabilitation  SUBJECTIVE:  SUBJECTIVE STATEMENT:  Pt reports no issues since last visit. States he has been doing well. 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   Pt accompanied by: self  PERTINENT HISTORY:   From recent MD appointment. "Admitted to [facility] 01/2021  acute HFrEF. Echo  EF 25-30% from previous EF 60-65%. R/LHC w/ no CAD, moderately elevated left heart and pulmonary artery pressures, severely elevated right heart filling pressures, severely reduced Fick cardiac output/index.  Required intropes, pressor, and lasix drip.  Transferred to [facility] for cardiogenic/septic shock, + MSSA bacteremia. Underwent TEE showing EF 25-30%, RV moderately down, severe biatrial enlargement and moderate MR, no valvular vegetation. Required addition of mexitiline to suppress PVCs. Hospitalization c/b AKI and hypoxic/hypercarbic respiratory failure.   Discharged from [facility] 03/11/21 after he completed IV antibiotics and rehab.    At last visit midodrine stopped and GDMT started.    Sleep study 4/23 AHI 2   Here for routine f/u. Here for f/u with his wife. Says he is doing pretty well. 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 Walking 1 mile at the [place] almost everyday. And riding stationary bike for 30 mins every night."  PAIN:  Are you having pain? No  PRECAUTIONS: None  RED FLAGS: None   WEIGHT BEARING RESTRICTIONS: No  FALLS: Has patient fallen in last 6 months? No  LIVING ENVIRONMENT: Lives with: lives with  their spouse Lives in: House/apartment Stairs: Yes: Internal: 12 steps; on right going up and on left going up and External: 1 steps; on right going up and on left going up Has following equipment at home: Single point cane and does not use often    PLOF: Independent, Independent with basic ADLs, Independent with gait, and Independent with transfers  PATIENT GOALS: improve balance and strength in legs.   OBJECTIVE:  Note: Objective measures were completed at Evaluation unless otherwise noted.  DIAGNOSTIC FINDINGS:  None relevant   COGNITION: Overall cognitive status: Within functional limits for tasks assessed   SENSATION: Light touch: Impaired  and intermittent n/t in feet up to shins    COORDINATION: WFL   EDEMA:  Mild edema in bil LE   MUSCLE TONE: WFL   POSTURE: rounded shoulders and forward head  LOWER EXTREMITY ROM:     Grossly WFL  LOWER EXTREMITY MMT:    MMT Right Eval Left Eval  Hip flexion 4 4  Hip extension    Hip abduction 4+ 4+  Hip adduction 4 4  Hip internal rotation    Hip external rotation    Knee flexion 4+ 4+  Knee extension 4+ 4+  Ankle dorsiflexion 4+ 4+  Ankle plantarflexion    Ankle inversion    Ankle eversion    (Blank rows = not tested)  BED MOBILITY:  Sit to supine Complete Independence Supine to sit Complete Independence  TRANSFERS: Assistive device utilized: None  Sit to stand: Complete Independence Stand to sit: Complete Independence Chair to chair: Complete Independence Floor:  unable to complete .  Requires UE support topush from thighs to stand   RAMP:  Level of Assistance: Complete Independence Assistive device utilized:  Ramp Comments:   CURB:  Level of Assistance: Modified independence Assistive device utilized:  rail Curb Comments:   STAIRS: Level of Assistance: SBA Stair Negotiation Technique: Step to Pattern Alternating Pattern  with Single Rail on Right Number of Stairs: 4  Height of Stairs:  6  Comments: step to on descent  GAIT: Gait pattern: lateral hip instability, trunk  flexed, and wide BOS Distance walked: 915 Assistive device utilized: None Level of assistance: Complete Independence Comments: increased flexed posture with increased distance  FUNCTIONAL TESTS:  5 times sit to stand: 19.11 with UE support; 21.44 with UE support on knees.  Timed up and go (TUG): 15.64, 13.74 sec  6 minute walk test: 978ft  10 meter walk test: 12.43 and 11.68 Berg Balance Scale: TBD Dynamic Gait Index: TBD  PATIENT SURVEYS:  ABC scale 60                                                                                                                              TREATMENT DATE: DATE: 05/24/2023    TE STS, without UE support  2x12 Seated LAQ, GTB donned x 12 ea LE  Seated hip abduction GTB x 15 bil  Standing cal raises from wedge x 15   TA Side stepping with GTB at ankles 50ft x 3 bil  Reciprocal foot taps on 4 inch step x 15 bil  Reciprocal step over bolster x 12 bil with intermittent UE support  Lateral step over bolster x 12 bil without UE Support   Gait to weave through equipment in rehab gym to simulate navigation of work site; supervision assist 2x 43ft with cues for posture and increased step length.   Pt required short rest break for urination. Ambulatory through rehab department without assist.    PATIENT EDUCATION: Education details: Purpose of functional outcome testing and education in HEP Person educated: Patient Education method: Explanation Education comprehension: verbalized understanding  HOME EXERCISE PROGRAM: Access Code: URL: [email or web address] Date: 05/12/2023 Prepared by: Maureen Ralphs  Exercises - Standing Tandem Balance with Counter Support  - 3 x weekly - 3 sets - up to 30 sec hold - Standing March with Counter Support  - 3 x weekly - 3 sets - 10 reps - Standing Single Leg Stance with Counter Support  - 73 x  weekly - 3-5 sets - as long as possible hold   GOALS: Goals reviewed with patient? Yes  SHORT TERM GOALS: Target date: 06/09/2023  Patient will be independent in home exercise program to improve strength/mobility for better functional independence with ADLs. Baseline: to be given visit 2  Goal status: INITIAL   LONG TERM GOALS: Target date: 08/04/2023  Patient will increase ABC score to equal to or greater than 72  to demonstrate statistically significant improvement in mobility and quality of life.  Baseline: 60 Goal status: INITIAL  2.  Patient (> 5 years old) will complete five times sit to stand test in < 15 seconds indicating an increased LE strength and improved balance. Baseline: 21.44 Goal status: INITIAL  3.  Patient will increase Berg Balance score by > 6 points to demonstrate decreased fall risk during functional activities Baseline: 1/30= 38/56 Goal status: INITIAL  4.  Patient will increase 10 meter walk test to >1.41m/s as to improve gait speed for better community ambulation  and to reduce fall risk. Baseline: 0.83 m/s Goal status: INITIAL  5.  Patient will reduce timed up and go to <11 seconds to reduce fall risk and demonstrate improved transfer/gait ability. Baseline: 14 sec  Goal status: INITIAL  6.  Patient will increase dynamic gait index score to >19/24 as to demonstrate reduced fall risk and improved dynamic gait balance for better safety with community/home ambulation.   Baseline: 05/12/23: 17 Goal status: INITIAL    ASSESSMENT:  CLINICAL IMPRESSION:  Patient arrived with good motivation for completion of pt activities. PT treatment focused on improved BLE strength and improved balance and movement patterns to improve safety with communtiy and work mobility. Tolerated well, with only min cues for safety in turns with emphasis on improved step length and posture.  Pt will continue to benefit from skilled physical therapy intervention to address  impairments, improve QOL, and attain therapy goals.       OBJECTIVE IMPAIRMENTS: Abnormal gait, cardiopulmonary status limiting activity, decreased activity tolerance, decreased balance, decreased coordination, decreased endurance, decreased knowledge of use of DME, decreased mobility, difficulty walking, decreased ROM, decreased strength, hypomobility, impaired perceived functional ability, impaired flexibility, impaired sensation, improper body mechanics, postural dysfunction, and obesity.   ACTIVITY LIMITATIONS: carrying, lifting, bending, standing, squatting, stairs, transfers, and locomotion level  PARTICIPATION LIMITATIONS: cleaning, laundry, community activity, and occupation  PERSONAL FACTORS: Age and 1-2 comorbidities: obesity CHF  are also affecting patient's functional outcome.   REHAB POTENTIAL: Good  CLINICAL DECISION MAKING: Stable/uncomplicated  EVALUATION COMPLEXITY: Moderate  PLAN:  PT FREQUENCY: 1-2x/week  PT DURATION: 12 weeks  PLANNED INTERVENTIONS: 97110-Therapeutic exercises, 97530- Therapeutic activity, [id number]- Neuromuscular re-education, 97535- Self Care, 16109- Manual therapy, [phone]- Gait training, Patient/Family education, Balance training, Stair training, Taping, DME instructions, Cryotherapy, and Moist heat  PLAN FOR NEXT SESSION:  LE strengthening Balance training Add to Balance HEP as appropriate.     Golden Pop PT ,DPT Physical Therapist- [facility]  05/31/23, 4:33 PM

## 2023-06-02 ENCOUNTER — Ambulatory Visit: Payer: PPO

## 2023-06-07 ENCOUNTER — Other Ambulatory Visit (HOSPITAL_COMMUNITY): Payer: Self-pay

## 2023-06-07 ENCOUNTER — Ambulatory Visit: Payer: PPO | Admitting: Physical Therapy

## 2023-06-07 DIAGNOSIS — R2681 Unsteadiness on feet: Secondary | ICD-10-CM

## 2023-06-07 DIAGNOSIS — R2689 Other abnormalities of gait and mobility: Secondary | ICD-10-CM

## 2023-06-07 NOTE — Therapy (Signed)
 OUTPATIENT PHYSICAL THERAPY NEURO TREATMENT   Patient Name: Anthony Weber MRN: [id number] DOB:[birth year], 78 y.o., male Today's Date: 06/07/2023   PCP: Jaclyn Shaggy, MD  REFERRING PROVIDER: Dolores Patty, MD   END OF SESSION:  PT End of Session - 06/07/23 1632     Visit Number 7    Number of Visits 24    Date for PT Re-Evaluation 08/02/23    Progress Note Due on Visit 10    PT Start Time 1622    PT Stop Time 1700    PT Time Calculation (min) 38 min    Equipment Utilized During Treatment Gait belt    Activity Tolerance Patient tolerated treatment well    Behavior During Therapy WFL for tasks assessed/performed              Past Medical History:  Diagnosis Date   Depression    Hypertension    Hypothyroidism    Past Surgical History:  Procedure Laterality Date   COLONOSCOPY WITH PROPOFOL N/A 03/03/2015   Procedure: COLONOSCOPY WITH PROPOFOL;  Surgeon: Scot Jun, MD;  Location: [facility] ENDOSCOPY;  Service: Endoscopy;  Laterality: N/A;   EYE SURGERY     RIGHT/LEFT HEART CATH AND CORONARY ANGIOGRAPHY N/A 01/27/2021   Procedure: RIGHT/LEFT HEART CATH AND CORONARY ANGIOGRAPHY;  Surgeon: Yvonne Kendall, MD;  Location: [facility] INVASIVE CV LAB;  Service: Cardiovascular;  Laterality: N/A;   TEE WITHOUT CARDIOVERSION N/A 02/05/2021   Procedure: TRANSESOPHAGEAL ECHOCARDIOGRAM (TEE);  Surgeon: Dolores Patty, MD;  Location: [facility] ENDOSCOPY;  Service: Cardiovascular;  Laterality: N/A;   Patient Active Problem List   Diagnosis Date Noted   Hyponatremia    Acute on chronic combined systolic and diastolic congestive heart failure (HCC)    Acute on chronic respiratory failure with hypoxia and hypercapnia (HCC)    MSSA bacteremia 02/03/2021   Central line infection    Pneumonia of both lower lobes due to methicillin susceptible Staphylococcus aureus (MSSA) (HCC)    Cardiogenic shock (HCC) 01/29/2021   Primary hypertension    Acute HFrEF (heart failure with  reduced ejection fraction) (HCC)    Acute CHF (congestive heart failure) (HCC) 01/18/2021    ONSET DATE: 3 years.   REFERRING DIAG:  Diagnosis  R26.89 (ICD-10-CM) - Imbalance    THERAPY DIAG:  Unsteadiness on feet  Imbalance  Balance disorder  Rationale for Evaluation and Treatment: Rehabilitation  SUBJECTIVE:  SUBJECTIVE STATEMENT:  Pt reports no issues since last visit. Reports completing HEP daily since last week. Has been selling more cars with increased temperature.   Pt accompanied by: self  PERTINENT HISTORY:   From recent MD appointment. "Admitted to [facility] 01/2021  acute HFrEF. Echo  EF 25-30% from previous EF 60-65%. R/LHC w/ no CAD, moderately elevated left heart and pulmonary artery pressures, severely elevated right heart filling pressures, severely reduced Fick cardiac output/index.  Required intropes, pressor, and lasix drip.  Transferred to [facility] for cardiogenic/septic shock, + MSSA bacteremia. Underwent TEE showing EF 25-30%, RV moderately down, severe biatrial enlargement and moderate MR, no valvular vegetation. Required addition of mexitiline to suppress PVCs. Hospitalization c/b AKI and hypoxic/hypercarbic respiratory failure.   Discharged from [facility] 03/11/21 after he completed IV antibiotics and rehab.    At last visit midodrine stopped and GDMT started.    Sleep study 4/23 AHI 2   Here for routine f/u. Here for f/u with his wife. Says he is doing pretty well. 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 Walking 1 mile at the [place] almost everyday. And riding stationary bike for 30 mins every night."  PAIN:  Are you having pain? No  PRECAUTIONS: None  RED FLAGS: None   WEIGHT BEARING RESTRICTIONS: No  FALLS: Has patient fallen in last 6 months? No  LIVING  ENVIRONMENT: Lives with: lives with their spouse Lives in: House/apartment Stairs: Yes: Internal: 12 steps; on right going up and on left going up and External: 1 steps; on right going up and on left going up Has following equipment at home: Single point cane and does not use often    PLOF: Independent, Independent with basic ADLs, Independent with gait, and Independent with transfers  PATIENT GOALS: improve balance and strength in legs.   OBJECTIVE:  Note: Objective measures were completed at Evaluation unless otherwise noted.  DIAGNOSTIC FINDINGS:  None relevant   COGNITION: Overall cognitive status: Within functional limits for tasks assessed   SENSATION: Light touch: Impaired  and intermittent n/t in feet up to shins    COORDINATION: WFL   EDEMA:  Mild edema in bil LE   MUSCLE TONE: WFL   POSTURE: rounded shoulders and forward head  LOWER EXTREMITY ROM:     Grossly WFL  LOWER EXTREMITY MMT:    MMT Right Eval Left Eval  Hip flexion 4 4  Hip extension    Hip abduction 4+ 4+  Hip adduction 4 4  Hip internal rotation    Hip external rotation    Knee flexion 4+ 4+  Knee extension 4+ 4+  Ankle dorsiflexion 4+ 4+  Ankle plantarflexion    Ankle inversion    Ankle eversion    (Blank rows = not tested)  BED MOBILITY:  Sit to supine Complete Independence Supine to sit Complete Independence  TRANSFERS: Assistive device utilized: None  Sit to stand: Complete Independence Stand to sit: Complete Independence Chair to chair: Complete Independence Floor:  unable to complete .  Requires UE support topush from thighs to stand   RAMP:  Level of Assistance: Complete Independence Assistive device utilized:  Ramp Comments:   CURB:  Level of Assistance: Modified independence Assistive device utilized:  rail Curb Comments:   STAIRS: Level of Assistance: SBA Stair Negotiation Technique: Step to Pattern Alternating Pattern  with Single Rail on Right Number  of Stairs: 4  Height of Stairs: 6  Comments: step to on descent  GAIT: Gait pattern: lateral hip instability,  trunk flexed, and wide BOS Distance walked: 915 Assistive device utilized: None Level of assistance: Complete Independence Comments: increased flexed posture with increased distance  FUNCTIONAL TESTS:  5 times sit to stand: 19.11 with UE support; 21.44 with UE support on knees.  Timed up and go (TUG): 15.64, 13.74 sec  6 minute walk test: 974ft  10 meter walk test: 12.43 and 11.68 Berg Balance Scale: TBD Dynamic Gait Index: TBD  PATIENT SURVEYS:  ABC scale 60                                                                                                                              TREATMENT DATE: DATE: 05/24/2023   Standing:  Row with RTB 2 x 15  Shoulder extension in semitandem 2 x 15  Trunk rotation 2x 10 bil in standing with RTB    - Standing Tandem Balance with Counter Support 2 x 20 sec hold bil - Standing March with Counter Support  2x 10 reps - Standing Single Leg Stance with Counter Support  2 x 20 sec bil  - Standing 3-Way Leg Reach with Resistance at Ankles and Counter Support 8 reps bil  - Side Stepping with Counter Support  66ft x 5 reps bil - Backward Walking with Counter Support 6 ft  - 5 reps each   CGA throughout session for safety throughout session with one near LOB, but able to correct with stepping strategy to prevent posterior LOB.   Cues for improved posture and core activation in stance improve use of ankle strategy with COM control and reduce lateral sway    PATIENT EDUCATION: Education details: Purpose of functional outcome testing and education in HEP Person educated: Patient Education method: Explanation Education comprehension: verbalized understanding  HOME EXERCISE PROGRAM: Access Code: URL: [email or web address] Date: 06/07/2023 Prepared by: Grier Rocher  Exercises - Standing Tandem Balance with  Counter Support  - 3 x weekly - 3 sets - up to 30 sec hold - Standing March with Counter Support  - 3 x weekly - 3 sets - 10 reps - Standing Single Leg Stance with Counter Support  - 3 x weekly - 3-5 sets - as long as possible hold - Standing 3-Way Leg Reach with Resistance at Ankles and Counter Support  - 1 x daily - 3 x weekly - 3 sets - 8 reps - Side Stepping with Counter Support  - 1 x daily - 3 x weekly - 3 sets - 3 reps - Backward Walking with Counter Support  - 1 x daily - 7 x weekly - 3 sets - 5 reps   GOALS: Goals reviewed with patient? Yes  SHORT TERM GOALS: Target date: 06/09/2023  Patient will be independent in home exercise program to improve strength/mobility for better functional independence with ADLs. Baseline: to be given visit 2  Goal status: INITIAL   LONG TERM GOALS: Target date: 08/04/2023  Patient will increase ABC score to equal to  or greater than 72  to demonstrate statistically significant improvement in mobility and quality of life.  Baseline: 60 Goal status: INITIAL  2.  Patient (> 81 years old) will complete five times sit to stand test in < 15 seconds indicating an increased LE strength and improved balance. Baseline: 21.44 Goal status: INITIAL  3.  Patient will increase Berg Balance score by > 6 points to demonstrate decreased fall risk during functional activities Baseline: 1/30= 38/56 Goal status: INITIAL  4.  Patient will increase 10 meter walk test to >1.75m/s as to improve gait speed for better community ambulation and to reduce fall risk. Baseline: 0.83 m/s Goal status: INITIAL  5.  Patient will reduce timed up and go to <11 seconds to reduce fall risk and demonstrate improved transfer/gait ability. Baseline: 14 sec  Goal status: INITIAL  6.  Patient will increase dynamic gait index score to >19/24 as to demonstrate reduced fall risk and improved dynamic gait balance for better safety with community/home ambulation.   Baseline: 05/12/23:  17 Goal status: INITIAL    ASSESSMENT:  CLINICAL IMPRESSION:  Patient arrived with good motivation for completion of pt activities. PT treatment focused on improved BLE strength and core activation with static balance training. Pt noted to have improved use of ankle strategy and glute activation with increased repetitions to correct lateral LOB. Able to progress to intermittent UE support with tandem and SLS.  Pt will continue to benefit from skilled physical therapy intervention to address impairments, improve QOL, and attain therapy goals.       OBJECTIVE IMPAIRMENTS: Abnormal gait, cardiopulmonary status limiting activity, decreased activity tolerance, decreased balance, decreased coordination, decreased endurance, decreased knowledge of use of DME, decreased mobility, difficulty walking, decreased ROM, decreased strength, hypomobility, impaired perceived functional ability, impaired flexibility, impaired sensation, improper body mechanics, postural dysfunction, and obesity.   ACTIVITY LIMITATIONS: carrying, lifting, bending, standing, squatting, stairs, transfers, and locomotion level  PARTICIPATION LIMITATIONS: cleaning, laundry, community activity, and occupation  PERSONAL FACTORS: Age and 1-2 comorbidities: obesity CHF  are also affecting patient's functional outcome.   REHAB POTENTIAL: Good  CLINICAL DECISION MAKING: Stable/uncomplicated  EVALUATION COMPLEXITY: Moderate  PLAN:  PT FREQUENCY: 1-2x/week  PT DURATION: 12 weeks  PLANNED INTERVENTIONS: 97110-Therapeutic exercises, 97530- Therapeutic activity, [id number]- Neuromuscular re-education, 97535- Self Care, [id number]- Manual therapy, [phone]- Gait training, Patient/Family education, Balance training, Stair training, Taping, DME instructions, Cryotherapy, and Moist heat  PLAN FOR NEXT SESSION:  LE strengthening Balance training Core activation.    Golden Pop PT ,DPT Physical Therapist- [facility]  06/07/23, 4:33 PM

## 2023-06-08 NOTE — Therapy (Unsigned)
 OUTPATIENT PHYSICAL THERAPY NEURO TREATMENT   Patient Name: Anthony Weber MRN: [id number] DOB:1946-03-01, 78 y.o., male Today's Date: 06/09/2023   PCP: Jaclyn Shaggy, MD  REFERRING PROVIDER: Dolores Patty, MD   END OF SESSION:   PT End of Session - 06/09/23 1004     Visit Number 8    Number of Visits 24    Date for PT Re-Evaluation 08/02/23    Progress Note Due on Visit 10    PT Start Time 1015    PT Stop Time 1056    PT Time Calculation (min) 41 min    Equipment Utilized During Treatment Gait belt    Activity Tolerance Patient tolerated treatment well    Behavior During Therapy WFL for tasks assessed/performed               Past Medical History:  Diagnosis Date   Depression    Hypertension    Hypothyroidism    Past Surgical History:  Procedure Laterality Date   COLONOSCOPY WITH PROPOFOL N/A 03/03/2015   Procedure: COLONOSCOPY WITH PROPOFOL;  Surgeon: Scot Jun, MD;  Location: [facility] ENDOSCOPY;  Service: Endoscopy;  Laterality: N/A;   EYE SURGERY     RIGHT/LEFT HEART CATH AND CORONARY ANGIOGRAPHY N/A 01/27/2021   Procedure: RIGHT/LEFT HEART CATH AND CORONARY ANGIOGRAPHY;  Surgeon: Yvonne Kendall, MD;  Location: [facility] INVASIVE CV LAB;  Service: Cardiovascular;  Laterality: N/A;   TEE WITHOUT CARDIOVERSION N/A 02/05/2021   Procedure: TRANSESOPHAGEAL ECHOCARDIOGRAM (TEE);  Surgeon: Dolores Patty, MD;  Location: [facility] ENDOSCOPY;  Service: Cardiovascular;  Laterality: N/A;   Patient Active Problem List   Diagnosis Date Noted   Hyponatremia    Acute on chronic combined systolic and diastolic congestive heart failure (HCC)    Acute on chronic respiratory failure with hypoxia and hypercapnia (HCC)    MSSA bacteremia 02/03/2021   Central line infection    Pneumonia of both lower lobes due to methicillin susceptible Staphylococcus aureus (MSSA) (HCC)    Cardiogenic shock (HCC) 01/29/2021   Primary hypertension    Acute HFrEF (heart failure with  reduced ejection fraction) (HCC)    Acute CHF (congestive heart failure) (HCC) 01/18/2021    ONSET DATE: 3 years.   REFERRING DIAG:  Diagnosis  R26.89 (ICD-10-CM) - Imbalance    THERAPY DIAG:  Unsteadiness on feet  Imbalance  Balance disorder  Rationale for Evaluation and Treatment: Rehabilitation  SUBJECTIVE:  SUBJECTIVE STATEMENT:  Pt reports he tries to "walk " each day but sometimes he gets "caught up and can't do it." States he hasn't done it this week. Pt reports he is doing the "same...ain't much different, no better and no worse."  Pt reports he was busy all day yesterday and didn't get a chance to try his new HEP.   Pt accompanied by: self  PERTINENT HISTORY:   From recent MD appointment. "Admitted to [facility] 01/2021  acute HFrEF. Echo  EF 25-30% from previous EF 60-65%. R/LHC w/ no CAD, moderately elevated left heart and pulmonary artery pressures, severely elevated right heart filling pressures, severely reduced Fick cardiac output/index.  Required intropes, pressor, and lasix drip.  Transferred to [facility] for cardiogenic/septic shock, + MSSA bacteremia. Underwent TEE showing EF 25-30%, RV moderately down, severe biatrial enlargement and moderate MR, no valvular vegetation. Required addition of mexitiline to suppress PVCs. Hospitalization c/b AKI and hypoxic/hypercarbic respiratory failure.   Discharged from [facility] 03/11/21 after he completed IV antibiotics and rehab.    At last visit midodrine stopped and GDMT started.    Sleep study 4/23 AHI 2   Here for routine f/u. Here for f/u with his wife. Says he is doing pretty well. 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 Walking 1 mile at the [place] almost everyday. And riding stationary bike for 30 mins every night."  PAIN:  Are you having  pain? No  PRECAUTIONS: None  RED FLAGS: None   WEIGHT BEARING RESTRICTIONS: No  FALLS: Has patient fallen in last 6 months? No  LIVING ENVIRONMENT: Lives with: lives with their spouse Lives in: House/apartment Stairs: Yes: Internal: 12 steps; on right going up and on left going up and External: 1 steps; on right going up and on left going up Has following equipment at home: Single point cane and does not use often    PLOF: Independent, Independent with basic ADLs, Independent with gait, and Independent with transfers  PATIENT GOALS: improve balance and strength in legs.   OBJECTIVE:  Note: Objective measures were completed at Evaluation unless otherwise noted.  DIAGNOSTIC FINDINGS:  None relevant   COGNITION: Overall cognitive status: Within functional limits for tasks assessed   SENSATION: Light touch: Impaired  and intermittent n/t in feet up to shins    COORDINATION: WFL   EDEMA:  Mild edema in bil LE   MUSCLE TONE: WFL   POSTURE: rounded shoulders and forward head  LOWER EXTREMITY ROM:     Grossly WFL  LOWER EXTREMITY MMT:    MMT Right Eval Left Eval  Hip flexion 4 4  Hip extension    Hip abduction 4+ 4+  Hip adduction 4 4  Hip internal rotation    Hip external rotation    Knee flexion 4+ 4+  Knee extension 4+ 4+  Ankle dorsiflexion 4+ 4+  Ankle plantarflexion    Ankle inversion    Ankle eversion    (Blank rows = not tested)  BED MOBILITY:  Sit to supine Complete Independence Supine to sit Complete Independence  TRANSFERS: Assistive device utilized: None  Sit to stand: Complete Independence Stand to sit: Complete Independence Chair to chair: Complete Independence Floor:  unable to complete .  Requires UE support topush from thighs to stand   RAMP:  Level of Assistance: Complete Independence Assistive device utilized:  Ramp Comments:   CURB:  Level of Assistance: Modified independence Assistive device utilized:  rail Curb  Comments:   STAIRS: Level of Assistance:  SBA Stair Negotiation Technique: Step to Pattern Alternating Pattern  with Single Rail on Right Number of Stairs: 4  Height of Stairs: 6  Comments: step to on descent  GAIT: Gait pattern: lateral hip instability, trunk flexed, and wide BOS Distance walked: 915 Assistive device utilized: None Level of assistance: Complete Independence Comments: increased flexed posture with increased distance  FUNCTIONAL TESTS:  5 times sit to stand: 19.11 with UE support; 21.44 with UE support on knees.  Timed up and go (TUG): 15.64, 13.74 sec  6 minute walk test: 922ft  10 meter walk test: 12.43 and 11.68 Berg Balance Scale: TBD Dynamic Gait Index: TBD  PATIENT SURVEYS:  ABC scale 60                                                                                                                              TREATMENT DATE: DATE: 06/09/23  Unless otherwise stated, CGA was provided and gait belt donned in order to ensure pt safety throughout session.  B LE and B UE reciprocal movement pattern retraining and strengthening using Octane against level 7 resistance for 5 minutes.  Sit<>stands from EOM no UE support 2x 10reps - cuing for increased anterior trunk lean and not relying on back of legs against mat to maintain balance while rising to stand.  Standing with B UEs overhead to promote increased core engagement and upright posture while performing marching in place 2 x10 reps progressed to 1lb dumbbell in each hand for 2nd set - min A for balance - adequate hip/knee flexion when marching.  Side stepping using cable pull Pavlov holding handle at chest level against 7.5lb resistance down/back 3-4steps x3 reps - min A for balance with greatest challenge being pulled back towards R.  In // bars for safety performed the below stepping retraining and static standing balance challenges:  - forward/backwards stepping over PVC pipe x10reps with greatest  challenge leading with L LE  - side stepping over hurdle with B UE support progressed to no UE support with min A for balance - is sufficiently challenged by lifting leg to this height - forward/backwards stepping through agility ladder - not significantly difficult in // bars - narrow BOS eyes closed x30sec - semi-tandem  eyes closed 2x 30sec each - requires light min A due to R/L sway *Pt with great difficulty maintaining balance with this task  Would benefit from future interventions including: eyes closed, improved posture (decreased thoracic kyphosis), backwards stepping, and turning   PATIENT EDUCATION: Education details: Purpose of functional outcome testing and education in HEP Person educated: Patient Education method: Explanation Education comprehension: verbalized understanding  HOME EXERCISE PROGRAM:  Access Code: URL: [email or web address] Date: 06/07/2023 Prepared by: Grier Rocher  Exercises - Standing Tandem Balance with Counter Support  - 3 x weekly - 3 sets - up to 30 sec hold - Standing March with Counter Support  - 3 x weekly - 3 sets - 10 reps -  Standing Single Leg Stance with Counter Support  - 3 x weekly - 3-5 sets - as long as possible hold - Standing 3-Way Leg Reach with Resistance at Ankles and Counter Support  - 1 x daily - 3 x weekly - 3 sets - 8 reps - Side Stepping with Counter Support  - 1 x daily - 3 x weekly - 3 sets - 3 reps - Backward Walking with Counter Support  - 1 x daily - 7 x weekly - 3 sets - 5 reps   GOALS: Goals reviewed with patient? Yes  SHORT TERM GOALS: Target date: 06/09/2023   Patient will be independent in home exercise program to improve strength/mobility for better functional independence with ADLs. Baseline: 06/07/23: HEP provided  Goal status: IN PROGRESS   LONG TERM GOALS: Target date: 08/04/2023  Patient will increase ABC score to equal to or greater than 72  to demonstrate statistically  significant improvement in mobility and quality of life.  Baseline: 60 Goal status: INITIAL  2.  Patient (> 26 years old) will complete five times sit to stand test in < 15 seconds indicating an increased LE strength and improved balance. Baseline: 21.44 Goal status: INITIAL  3.  Patient will increase Berg Balance score by > 6 points to demonstrate decreased fall risk during functional activities Baseline: 1/30= 38/56 Goal status: INITIAL  4.  Patient will increase 10 meter walk test to >1.22m/s as to improve gait speed for better community ambulation and to reduce fall risk. Baseline: 0.83 m/s Goal status: INITIAL  5.  Patient will reduce timed up and go to <11 seconds to reduce fall risk and demonstrate improved transfer/gait ability. Baseline: 14 sec  Goal status: INITIAL  6.  Patient will increase dynamic gait index score to >19/24 as to demonstrate reduced fall risk and improved dynamic gait balance for better safety with community/home ambulation.   Baseline: 05/12/23: 17 Goal status: INITIAL    ASSESSMENT:  CLINICAL IMPRESSION:  Patient arrived with good motivation for completion of pt activities. PT treatment focused on improved BLE strength and core activation with static and dynamic balance training. Pt participated in higher level dynamic stepping balance challenges with decreased use of UE support. He demonstrates greatest challenges with static standing eyes closed, stepping backwards, and turning all causing LOB requiring assistance to maintain upright.  Pt will continue to benefit from skilled physical therapy intervention to address impairments, improve QOL, and attain therapy goals.       OBJECTIVE IMPAIRMENTS: Abnormal gait, cardiopulmonary status limiting activity, decreased activity tolerance, decreased balance, decreased coordination, decreased endurance, decreased knowledge of use of DME, decreased mobility, difficulty walking, decreased ROM, decreased  strength, hypomobility, impaired perceived functional ability, impaired flexibility, impaired sensation, improper body mechanics, postural dysfunction, and obesity.   ACTIVITY LIMITATIONS: carrying, lifting, bending, standing, squatting, stairs, transfers, and locomotion level  PARTICIPATION LIMITATIONS: cleaning, laundry, community activity, and occupation  PERSONAL FACTORS: Age and 1-2 comorbidities: obesity CHF  are also affecting patient's functional outcome.   REHAB POTENTIAL: Good  CLINICAL DECISION MAKING: Stable/uncomplicated  EVALUATION COMPLEXITY: Moderate  PLAN:  PT FREQUENCY: 1-2x/week  PT DURATION: 12 weeks  PLANNED INTERVENTIONS: 97110-Therapeutic exercises, 97530- Therapeutic activity, 97112- Neuromuscular re-education, 97535- Self Care, 62952- Manual therapy, [phone]- Gait training, Patient/Family education, Balance training, Stair training, Taping, DME instructions, Cryotherapy, and Moist heat  PLAN FOR NEXT SESSION:  - postural muscle strengthening due to thoracic kyphosis - balance eyes closed in tandem - dynamic stepping backwards  - core strengthening  Ginny Forth PT ,DPT Physical Therapist- [facility]  06/09/23, 5:56 PM

## 2023-06-09 ENCOUNTER — Ambulatory Visit: Payer: PPO | Admitting: Physical Therapy

## 2023-06-09 DIAGNOSIS — R2689 Other abnormalities of gait and mobility: Secondary | ICD-10-CM

## 2023-06-09 DIAGNOSIS — R2681 Unsteadiness on feet: Secondary | ICD-10-CM

## 2023-06-13 ENCOUNTER — Other Ambulatory Visit (HOSPITAL_COMMUNITY): Payer: Self-pay

## 2023-06-14 ENCOUNTER — Ambulatory Visit: Payer: PPO | Attending: Internal Medicine | Admitting: Physical Therapy

## 2023-06-14 DIAGNOSIS — R2689 Other abnormalities of gait and mobility: Secondary | ICD-10-CM | POA: Diagnosis not present

## 2023-06-14 DIAGNOSIS — R2681 Unsteadiness on feet: Secondary | ICD-10-CM | POA: Insufficient documentation

## 2023-06-14 NOTE — Therapy (Signed)
 OUTPATIENT PHYSICAL THERAPY NEURO TREATMENT   Patient Name: Anthony Weber MRN: [id number] DOB:[birth year], 78 y.o., male Today's Date: 06/14/2023   PCP: Jaclyn Shaggy, MD  REFERRING PROVIDER: Dolores Patty, MD   END OF SESSION:   PT End of Session - 06/14/23 1041     Visit Number 9    Number of Visits 24    Date for PT Re-Evaluation 08/02/23    Progress Note Due on Visit 10    PT Start Time 1019    PT Stop Time 1059    PT Time Calculation (min) 40 min    Equipment Utilized During Treatment Gait belt    Activity Tolerance Patient tolerated treatment well    Behavior During Therapy WFL for tasks assessed/performed               Past Medical History:  Diagnosis Date   Depression    Hypertension    Hypothyroidism    Past Surgical History:  Procedure Laterality Date   COLONOSCOPY WITH PROPOFOL N/A 03/03/2015   Procedure: COLONOSCOPY WITH PROPOFOL;  Surgeon: Scot Jun, MD;  Location: [facility] ENDOSCOPY;  Service: Endoscopy;  Laterality: N/A;   EYE SURGERY     RIGHT/LEFT HEART CATH AND CORONARY ANGIOGRAPHY N/A 01/27/2021   Procedure: RIGHT/LEFT HEART CATH AND CORONARY ANGIOGRAPHY;  Surgeon: Yvonne Kendall, MD;  Location: [facility] INVASIVE CV LAB;  Service: Cardiovascular;  Laterality: N/A;   TEE WITHOUT CARDIOVERSION N/A 02/05/2021   Procedure: TRANSESOPHAGEAL ECHOCARDIOGRAM (TEE);  Surgeon: Dolores Patty, MD;  Location: [facility] ENDOSCOPY;  Service: Cardiovascular;  Laterality: N/A;   Patient Active Problem List   Diagnosis Date Noted   Hyponatremia    Acute on chronic combined systolic and diastolic congestive heart failure (HCC)    Acute on chronic respiratory failure with hypoxia and hypercapnia (HCC)    MSSA bacteremia 02/03/2021   Central line infection    Pneumonia of both lower lobes due to methicillin susceptible Staphylococcus aureus (MSSA) (HCC)    Cardiogenic shock (HCC) 01/29/2021   Primary hypertension    Acute HFrEF (heart failure with  reduced ejection fraction) (HCC)    Acute CHF (congestive heart failure) (HCC) 01/18/2021    ONSET DATE: 3 years.   REFERRING DIAG:  Diagnosis  R26.89 (ICD-10-CM) - Imbalance    THERAPY DIAG:  Unsteadiness on feet  Balance disorder  Imbalance  Rationale for Evaluation and Treatment: Rehabilitation  SUBJECTIVE:  SUBJECTIVE STATEMENT:  Pt reports that he is doing well. "Might feel a little better today". No other updates.    Pt accompanied by: self  PERTINENT HISTORY:   From recent MD appointment. "Admitted to [facility] 01/2021  acute HFrEF. Echo  EF 25-30% from previous EF 60-65%. R/LHC w/ no CAD, moderately elevated left heart and pulmonary artery pressures, severely elevated right heart filling pressures, severely reduced Fick cardiac output/index.  Required intropes, pressor, and lasix drip.  Transferred to [facility] for cardiogenic/septic shock, + MSSA bacteremia. Underwent TEE showing EF 25-30%, RV moderately down, severe biatrial enlargement and moderate MR, no valvular vegetation. Required addition of mexitiline to suppress PVCs. Hospitalization c/b AKI and hypoxic/hypercarbic respiratory failure.   Discharged from [facility] 03/11/21 after he completed IV antibiotics and rehab.    At last visit midodrine stopped and GDMT started.    Sleep study 4/23 AHI 2   Here for routine f/u. Here for f/u with his wife. Says he is doing pretty well. 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 Walking 1 mile at the [place] almost everyday. And riding stationary bike for 30 mins every night."  PAIN:  Are you having pain? No  PRECAUTIONS: None  RED FLAGS: None   WEIGHT BEARING RESTRICTIONS: No  FALLS: Has patient fallen in last 6 months? No  LIVING ENVIRONMENT: Lives with: lives with their spouse Lives in:  House/apartment Stairs: Yes: Internal: 12 steps; on right going up and on left going up and External: 1 steps; on right going up and on left going up Has following equipment at home: Single point cane and does not use often    PLOF: Independent, Independent with basic ADLs, Independent with gait, and Independent with transfers  PATIENT GOALS: improve balance and strength in legs.   OBJECTIVE:  Note: Objective measures were completed at Evaluation unless otherwise noted.  DIAGNOSTIC FINDINGS:  None relevant   COGNITION: Overall cognitive status: Within functional limits for tasks assessed   SENSATION: Light touch: Impaired  and intermittent n/t in feet up to shins    COORDINATION: WFL   EDEMA:  Mild edema in bil LE   MUSCLE TONE: WFL   POSTURE: rounded shoulders and forward head  LOWER EXTREMITY ROM:     Grossly WFL  LOWER EXTREMITY MMT:    MMT Right Eval Left Eval  Hip flexion 4 4  Hip extension    Hip abduction 4+ 4+  Hip adduction 4 4  Hip internal rotation    Hip external rotation    Knee flexion 4+ 4+  Knee extension 4+ 4+  Ankle dorsiflexion 4+ 4+  Ankle plantarflexion    Ankle inversion    Ankle eversion    (Blank rows = not tested)  BED MOBILITY:  Sit to supine Complete Independence Supine to sit Complete Independence  TRANSFERS: Assistive device utilized: None  Sit to stand: Complete Independence Stand to sit: Complete Independence Chair to chair: Complete Independence Floor:  unable to complete .  Requires UE support topush from thighs to stand   RAMP:  Level of Assistance: Complete Independence Assistive device utilized:  Ramp Comments:   CURB:  Level of Assistance: Modified independence Assistive device utilized:  rail Curb Comments:   STAIRS: Level of Assistance: SBA Stair Negotiation Technique: Step to Pattern Alternating Pattern  with Single Rail on Right Number of Stairs: 4  Height of Stairs: 6  Comments: step to on  descent  GAIT: Gait pattern: lateral hip instability, trunk flexed, and wide BOS  Distance walked: 915 Assistive device utilized: None Level of assistance: Complete Independence Comments: increased flexed posture with increased distance  FUNCTIONAL TESTS:  5 times sit to stand: 19.11 with UE support; 21.44 with UE support on knees.  Timed up and go (TUG): 15.64, 13.74 sec  6 minute walk test: 965ft  10 meter walk test: 12.43 and 11.68 Berg Balance Scale: TBD Dynamic Gait Index: TBD  PATIENT SURVEYS:  ABC scale 60                                                                                                                              TREATMENT DATE: DATE: 06/14/23  Unless otherwise stated, supervision was provided and gait belt donned in order to ensure pt safety throughout session.  B LE and B UE reciprocal movement pattern retraining and strengthening using nustep against level 4 resistance for 6 minutes.  In parallel bars: Lateral step up/over 4 inch step with light UE support x 10 bil  Forward step up with contralateral hip flexoin x 8 bil  Reciprocal foot tap on 4inch step x 12 bil  Forward/reverse gait 72ft x 5 each  Side stepping 34ft bil x 4 each  Seated trunkal stabilization  Chest press against lateral resistance from BTB 2 x 12 bil  Standing mid row 2 x 15 BTB.  Standing trunk rotation BTB with 2 sec hold 2  x 15 bil   Cues throughout session for improved posture and core activation with variable gait training and resistive exercises. One near LOB, but utilized arm support on rail to prevent Lateral LOB to the R.   PATIENT EDUCATION: Education details: Purpose of functional outcome testing and education in HEP Person educated: Patient Education method: Explanation Education comprehension: verbalized understanding  HOME EXERCISE PROGRAM:  Access Code: URL: [email or web address] Date: 06/07/2023 Prepared by: Grier Rocher  Exercises - Standing Tandem Balance with Counter Support  - 3 x weekly - 3 sets - up to 30 sec hold - Standing March with Counter Support  - 3 x weekly - 3 sets - 10 reps - Standing Single Leg Stance with Counter Support  - 3 x weekly - 3-5 sets - as long as possible hold - Standing 3-Way Leg Reach with Resistance at Ankles and Counter Support  - 1 x daily - 3 x weekly - 3 sets - 8 reps - Side Stepping with Counter Support  - 1 x daily - 3 x weekly - 3 sets - 3 reps - Backward Walking with Counter Support  - 1 x daily - 7 x weekly - 3 sets - 5 reps   GOALS: Goals reviewed with patient? Yes  SHORT TERM GOALS: Target date: 06/09/2023   Patient will be independent in home exercise program to improve strength/mobility for better functional independence with ADLs. Baseline: 06/07/23: HEP provided  Goal status: IN PROGRESS   LONG TERM GOALS: Target date: 08/04/2023  Patient will increase ABC score  to equal to or greater than 72  to demonstrate statistically significant improvement in mobility and quality of life.  Baseline: 60 Goal status: INITIAL  2.  Patient (> 10 years old) will complete five times sit to stand test in < 15 seconds indicating an increased LE strength and improved balance. Baseline: 21.44 Goal status: INITIAL  3.  Patient will increase Berg Balance score by > 6 points to demonstrate decreased fall risk during functional activities Baseline: 1/30= 38/56 Goal status: INITIAL  4.  Patient will increase 10 meter walk test to >1.56m/s as to improve gait speed for better community ambulation and to reduce fall risk. Baseline: 0.83 m/s Goal status: INITIAL  5.  Patient will reduce timed up and go to <11 seconds to reduce fall risk and demonstrate improved transfer/gait ability. Baseline: 14 sec  Goal status: INITIAL  6.  Patient will increase dynamic gait index score to >19/24 as to demonstrate reduced fall risk and improved dynamic gait balance for better safety  with community/home ambulation.   Baseline: 05/12/23: 17 Goal status: INITIAL    ASSESSMENT:  CLINICAL IMPRESSION:  Patient arrived with good motivation for completion of pt activities. PT treatment focused on improved posture, core activation and movement in multiple planes. Pt tolerated well with decreased UE support intermittently, but required support with step up/down laterally and anteriorly.  1 mild LOB to the R with trunk rotation, requiring UE supported on rail to prevent fall.  Pt will continue to benefit from skilled physical therapy intervention to address impairments, improve QOL, and attain therapy goals.       OBJECTIVE IMPAIRMENTS: Abnormal gait, cardiopulmonary status limiting activity, decreased activity tolerance, decreased balance, decreased coordination, decreased endurance, decreased knowledge of use of DME, decreased mobility, difficulty walking, decreased ROM, decreased strength, hypomobility, impaired perceived functional ability, impaired flexibility, impaired sensation, improper body mechanics, postural dysfunction, and obesity.   ACTIVITY LIMITATIONS: carrying, lifting, bending, standing, squatting, stairs, transfers, and locomotion level  PARTICIPATION LIMITATIONS: cleaning, laundry, community activity, and occupation  PERSONAL FACTORS: Age and 1-2 comorbidities: obesity CHF  are also affecting patient's functional outcome.   REHAB POTENTIAL: Good  CLINICAL DECISION MAKING: Stable/uncomplicated  EVALUATION COMPLEXITY: Moderate  PLAN:  PT FREQUENCY: 1-2x/week  PT DURATION: 12 weeks  PLANNED INTERVENTIONS: 97110-Therapeutic exercises, 97530- Therapeutic activity, [id number]- Neuromuscular re-education, 97535- Self Care, 16109- Manual therapy, [id number]- Gait training, Patient/Family education, Balance training, Stair training, Taping, DME instructions, Cryotherapy, and Moist heat  PLAN FOR NEXT SESSION:   Progress note.     Golden Pop PT  ,DPT Physical Therapist- [facility]  06/14/23, 10:42 AM

## 2023-06-16 ENCOUNTER — Ambulatory Visit: Payer: PPO | Admitting: Physical Therapy

## 2023-06-16 DIAGNOSIS — R2681 Unsteadiness on feet: Secondary | ICD-10-CM

## 2023-06-16 DIAGNOSIS — R2689 Other abnormalities of gait and mobility: Secondary | ICD-10-CM

## 2023-06-16 NOTE — Therapy (Signed)
 OUTPATIENT PHYSICAL THERAPY NEURO TREATMENT  Physical Therapy Progress Note   Dates of reporting period  05/10/2023   to   06/16/2023    Patient Name: Anthony Weber MRN: [id number] DOB:Nov 05, 1945, 78 y.o., male Today's Date: 06/16/2023   PCP: Jaclyn Shaggy, MD  REFERRING PROVIDER: Dolores Patty, MD   END OF SESSION:   PT End of Session - 06/16/23 1149     Visit Number 10    Number of Visits 24    Date for PT Re-Evaluation 08/02/23    Progress Note Due on Visit 10    PT Start Time 1150    PT Stop Time 1232    PT Time Calculation (min) 42 min    Equipment Utilized During Treatment Gait belt    Activity Tolerance Patient tolerated treatment well    Behavior During Therapy WFL for tasks assessed/performed                Past Medical History:  Diagnosis Date   Depression    Hypertension    Hypothyroidism    Past Surgical History:  Procedure Laterality Date   COLONOSCOPY WITH PROPOFOL N/A 03/03/2015   Procedure: COLONOSCOPY WITH PROPOFOL;  Surgeon: Scot Jun, MD;  Location: [facility] ENDOSCOPY;  Service: Endoscopy;  Laterality: N/A;   EYE SURGERY     RIGHT/LEFT HEART CATH AND CORONARY ANGIOGRAPHY N/A 01/27/2021   Procedure: RIGHT/LEFT HEART CATH AND CORONARY ANGIOGRAPHY;  Surgeon: Yvonne Kendall, MD;  Location: [facility] INVASIVE CV LAB;  Service: Cardiovascular;  Laterality: N/A;   TEE WITHOUT CARDIOVERSION N/A 02/05/2021   Procedure: TRANSESOPHAGEAL ECHOCARDIOGRAM (TEE);  Surgeon: Dolores Patty, MD;  Location: [facility] ENDOSCOPY;  Service: Cardiovascular;  Laterality: N/A;   Patient Active Problem List   Diagnosis Date Noted   Hyponatremia    Acute on chronic combined systolic and diastolic congestive heart failure (HCC)    Acute on chronic respiratory failure with hypoxia and hypercapnia (HCC)    MSSA bacteremia 02/03/2021   Central line infection    Pneumonia of both lower lobes due to methicillin susceptible Staphylococcus aureus (MSSA) (HCC)     Cardiogenic shock (HCC) 01/29/2021   Primary hypertension    Acute HFrEF (heart failure with reduced ejection fraction) (HCC)    Acute CHF (congestive heart failure) (HCC) 01/18/2021    ONSET DATE: 3 years.   REFERRING DIAG:  Diagnosis  R26.89 (ICD-10-CM) - Imbalance    THERAPY DIAG:  Unsteadiness on feet  Balance disorder  Imbalance  Rationale for Evaluation and Treatment: Rehabilitation  SUBJECTIVE:  SUBJECTIVE STATEMENT:  Pt reports doing good. Denies pain. Denies stumbles/falls since last visit. Denies any medical changes. Pt reports he feels like he is doing "maybe a little better."  Pt accompanied by: self  PERTINENT HISTORY:   From recent MD appointment. "Admitted to [facility] 01/2021  acute HFrEF. Echo  EF 25-30% from previous EF 60-65%. R/LHC w/ no CAD, moderately elevated left heart and pulmonary artery pressures, severely elevated right heart filling pressures, severely reduced Fick cardiac output/index.  Required intropes, pressor, and lasix drip.  Transferred to [facility] for cardiogenic/septic shock, + MSSA bacteremia. Underwent TEE showing EF 25-30%, RV moderately down, severe biatrial enlargement and moderate MR, no valvular vegetation. Required addition of mexitiline to suppress PVCs. Hospitalization c/b AKI and hypoxic/hypercarbic respiratory failure.   Discharged from [facility] 03/11/21 after he completed IV antibiotics and rehab.    At last visit midodrine stopped and GDMT started.    Sleep study 4/23 AHI 2   Here for routine f/u. Here for f/u with his wife. Says he is doing pretty well. 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 Walking 1 mile at the [place] almost everyday. And riding stationary bike for 30 mins every night."  PAIN:  Are you having pain? No  PRECAUTIONS: None  RED  FLAGS: None   WEIGHT BEARING RESTRICTIONS: No  FALLS: Has patient fallen in last 6 months? No  LIVING ENVIRONMENT: Lives with: lives with their spouse Lives in: House/apartment Stairs: Yes: Internal: 12 steps; on right going up and on left going up and External: 1 steps; on right going up and on left going up Has following equipment at home: Single point cane and does not use often    PLOF: Independent, Independent with basic ADLs, Independent with gait, and Independent with transfers  PATIENT GOALS: improve balance and strength in legs.   OBJECTIVE:  Note: Objective measures were completed at Evaluation unless otherwise noted.  DIAGNOSTIC FINDINGS:  None relevant   COGNITION: Overall cognitive status: Within functional limits for tasks assessed   SENSATION: Light touch: Impaired  and intermittent n/t in feet up to shins    COORDINATION: WFL   EDEMA:  Mild edema in bil LE   MUSCLE TONE: WFL   POSTURE: rounded shoulders and forward head  LOWER EXTREMITY ROM:     Grossly WFL  LOWER EXTREMITY MMT:    MMT Right Eval Left Eval  Hip flexion 4 4  Hip extension    Hip abduction 4+ 4+  Hip adduction 4 4  Hip internal rotation    Hip external rotation    Knee flexion 4+ 4+  Knee extension 4+ 4+  Ankle dorsiflexion 4+ 4+  Ankle plantarflexion    Ankle inversion    Ankle eversion    (Blank rows = not tested)  BED MOBILITY:  Sit to supine Complete Independence Supine to sit Complete Independence  TRANSFERS: Assistive device utilized: None  Sit to stand: Complete Independence Stand to sit: Complete Independence Chair to chair: Complete Independence Floor:  unable to complete .  Requires UE support topush from thighs to stand   RAMP:  Level of Assistance: Complete Independence Assistive device utilized:  Ramp Comments:   CURB:  Level of Assistance: Modified independence Assistive device utilized:  rail Curb Comments:   STAIRS: Level of  Assistance: SBA Stair Negotiation Technique: Step to Pattern Alternating Pattern  with Single Rail on Right Number of Stairs: 4  Height of Stairs: 6  Comments: step to on descent  GAIT: Gait  pattern: lateral hip instability, trunk flexed, and wide BOS Distance walked: 915 Assistive device utilized: None Level of assistance: Complete Independence Comments: increased flexed posture with increased distance  FUNCTIONAL TESTS:  5 times sit to stand: 19.11 with UE support; 21.44 with UE support on knees.  Timed up and go (TUG): 15.64, 13.74 sec  6 minute walk test: 973ft  10 meter walk test: 12.43 and 11.68 Berg Balance Scale: TBD Dynamic Gait Index: TBD  PATIENT SURVEYS:  ABC scale 60                                                                                                                              TREATMENT DATE: DATE: 06/16/23  Activities-specific Balance Confidence Scale:  Score: 55% with pt reporting around 50-70% confident on majority of activities he would perform  Increased risk of falls in community-dwelling, older adults <80% (79.89%)  0% = no confidence - 100% = complete confidence (ANPTA Core Set of Outcome Measures for Adults with Neurologic Conditions, 2018)  Unless otherwise stated, supervision was provided and gait belt donned in order to ensure pt safety throughout session.  Five times Sit to Stand Test (FTSS) "Stand up and sit down as quickly as possible 5 times, keeping your arms folded across your chest."    TIME: 18.38 seconds without UE support  Times > 13.6 seconds is associated with increased disability and morbidity (Guralnik, 2000) Times > 15 seconds is predictive of recurrent falls in healthy individuals aged 71 and older (Buatois, et al., 2008) Normal performance values in community dwelling individuals aged 17 and older (Bohannon, 2006): 60-69 years: 11.4 seconds 70-79 years: 12.6 seconds 80-89 years: 14.8 seconds  MCID: >= 2.3 seconds  for Vestibular Disorders (Meretta, 2006)   Participated in Timed Up and Go (TUG): 1st trial: 13.39 seconds no AD 2nd trial: 11.74 seconds no AD Average: 12.57 seconds no AD Patient demonstrates high fall risk as indicated by requiring >13.5seconds to complete the TUG.   Patient participated in [facility] and demonstrates increased fall risk as noted by score of  40/56.  (<36= high risk for falls, close to 100%; 37-45 significant >80%; 46-51 moderate >50%; 52-55 lower >25%).  10 Meter Walk Test: Patient instructed to walk 10 meters (32.8 ft) as quickly and as safely as possible at their normal speed Results: 0.83 m/s (12 seconds) without AD  Cut off scores:   Household Ambulator  < 0.4 m/s  Limited Community Ambulator  0.4 - 0.8 m/s  [employer]  > 0.8 m/s  Increased fall risk  < 1.30m/s  Crossing a Street  >1.85m/s  MCID 0.05 m/s (small), 0.13 m/s (moderate), 0.06 m/s (significant)  (ANPTA Core Set of Outcome Measures for Adults with Neurologic Conditions, 2018)    Pt participated in Dynamic Gait Index (DGI) with score of 17/24 demonstrating high fall risk (< 19/24 = predictive of falls risk in community dwelling elderly).   [facility] PT  Assessment - 06/16/23 0001       Berg Balance Test   Sit to Stand Able to stand without using hands and stabilize independently    Standing Unsupported Able to stand safely 2 minutes    Sitting with Back Unsupported but Feet Supported on Floor or Stool Able to sit safely and securely 2 minutes    Stand to Sit Sits safely with minimal use of hands    Transfers Able to transfer safely, minor use of hands    Standing Unsupported with Eyes Closed Able to stand 10 seconds with supervision    Standing Unsupported with Feet Together Able to place feet together independently and stand for 1 minute with supervision    From Standing, Reach Forward with Outstretched Arm Can reach forward >12 cm safely (5")    From Standing Position, Pick up  Object from Floor Able to pick up shoe, needs supervision    From Standing Position, Turn to Look Behind Over each Shoulder Turn sideways only but maintains balance    Turn 360 Degrees Able to turn 360 degrees safely but slowly    Standing Unsupported, Alternately Place Feet on Step/Stool Able to complete >2 steps/needs minimal assist    Standing Unsupported, One Foot in Front Able to take small step independently and hold 30 seconds    Standing on One Leg Tries to lift leg/unable to hold 3 seconds but remains standing independently    Total Score 40      Dynamic Gait Index   Level Surface Mild Impairment   pt has mild gait deviations of wider BOS, increased postural sway, B LEs slightly externally rotated to keep balance   Change in Gait Speed Mild Impairment    Gait with Horizontal Head Turns Mild Impairment    Gait with Vertical Head Turns Normal    Gait and Pivot Turn Mild Impairment    Step Over Obstacle Moderate Impairment    Step Around Obstacles Normal    Steps Mild Impairment    Total Score 17            Therapist educated pt on results of testing today and plan to continue addressing his balance and gait deficits to decrease fall risk.   PATIENT EDUCATION: Education details: Purpose of functional outcome testing and education in HEP Person educated: Patient Education method: Explanation Education comprehension: verbalized understanding  HOME EXERCISE PROGRAM:  Access Code: URL: [email or web address] Date: 06/07/2023 Prepared by: Grier Rocher  Exercises - Standing Tandem Balance with Counter Support  - 3 x weekly - 3 sets - up to 30 sec hold - Standing March with Counter Support  - 3 x weekly - 3 sets - 10 reps - Standing Single Leg Stance with Counter Support  - 3 x weekly - 3-5 sets - as long as possible hold - Standing 3-Way Leg Reach with Resistance at Ankles and Counter Support  - 1 x daily - 3 x weekly - 3 sets - 8 reps - Side  Stepping with Counter Support  - 1 x daily - 3 x weekly - 3 sets - 3 reps - Backward Walking with Counter Support  - 1 x daily - 7 x weekly - 3 sets - 5 reps   GOALS: Goals reviewed with patient? Yes  SHORT TERM GOALS: Target date: 06/09/2023   Patient will be independent in home exercise program to improve strength/mobility for better functional independence with ADLs. Baseline: 06/07/23: HEP provided  06/16/2023: pt reports consistent compliance with  HEP Goal status: IN PROGRESS   LONG TERM GOALS: Target date: 08/04/2023  Patient will increase ABC score to equal to or greater than 72  to demonstrate statistically significant improvement in mobility and quality of life.  Baseline: 60 06/16/2023: 55% Goal status: IN PROGRESS  2.  Patient (> 95 years old) will complete five times sit to stand test in < 15 seconds indicating an increased LE strength and improved balance. Baseline: 21.44 06/16/2023: 18.38 seconds no UE support Goal status: IN PROGRESS  3.  Patient will increase Berg Balance score by > 6 points to demonstrate decreased fall risk during functional activities Baseline: 1/30= 38/56 06/16/2023: 40/56 Goal status: IN PROGRESS  4.  Patient will increase 10 meter walk test to >1.4m/s as to improve gait speed for better community ambulation and to reduce fall risk. Baseline: 0.83 m/s 06/16/2023: 0.83 m/s (12 seconds) without AD Goal status: IN PROGRESS  5.  Patient will reduce timed up and go to <11 seconds to reduce fall risk and demonstrate improved transfer/gait ability. Baseline: 14 sec  06/16/2023: 12.57 seconds no AD Goal status: IN PROGRESS  6.  Patient will increase dynamic gait index score to >19/24 as to demonstrate reduced fall risk and improved dynamic gait balance for better safety with community/home ambulation.  Baseline: 05/12/23: 17 06/16/2023: 17/24 Goal status: IN PROGRESS    ASSESSMENT:  CLINICAL IMPRESSION:  Patient is a 77y.o. male who was seen today for  physical therapy treatment for impaired balance, B LE weakness, impaired endurance, and gait deficits. Patient arrived with good motivation to participate in physical therapy. Therapy session focused on re-assessment of outcome measures with patient demonstrating a slight improvement on Berg Balance Test, TUG, and 5xSTS demonstrating changes in balance, functional transfers and gait. Patient demonstrates consistent score on his gait speed and dynamic gait index with no significant change in ABC score.  Patient will benefit from continued skilled physical therapy as a trial period for the remainder of this certification because patient demonstrates ability to improve despite it not being demonstrated at this time on the stated outcome measures. Patient remains highly motivated to participate in physical therapy with good compliance to HEP. Pt will continue to benefit from skilled physical therapy intervention to address impairments, improve QOL, and attain therapy goals. Patient's condition has the potential to improve in response to therapy. Maximum improvement is yet to be obtained. The anticipated improvement is attainable and reasonable in a generally predictable time.      OBJECTIVE IMPAIRMENTS: Abnormal gait, cardiopulmonary status limiting activity, decreased activity tolerance, decreased balance, decreased coordination, decreased endurance, decreased knowledge of use of DME, decreased mobility, difficulty walking, decreased ROM, decreased strength, hypomobility, impaired perceived functional ability, impaired flexibility, impaired sensation, improper body mechanics, postural dysfunction, and obesity.   ACTIVITY LIMITATIONS: carrying, lifting, bending, standing, squatting, stairs, transfers, and locomotion level  PARTICIPATION LIMITATIONS: cleaning, laundry, community activity, and occupation  PERSONAL FACTORS: Age and 1-2 comorbidities: obesity CHF  are also affecting patient's functional outcome.    REHAB POTENTIAL: Good  CLINICAL DECISION MAKING: Stable/uncomplicated  EVALUATION COMPLEXITY: Moderate  PLAN:  PT FREQUENCY: 1-2x/week  PT DURATION: 12 weeks  PLANNED INTERVENTIONS: 97110-Therapeutic exercises, 97530- Therapeutic activity, 97112- Neuromuscular re-education, 97535- Self Care, [id number]- Manual therapy, [phone]- Gait training, Patient/Family education, Balance training, Stair training, Taping, DME instructions, Cryotherapy, and Moist heat  PLAN FOR NEXT SESSION:  - balance with eyes closed - standing trunk rotations - dynamic turning  - alternating foot taps to unstable step  -  dynamic gait training stepping over obstacles    Tanav Orsak Verdell Face PT ,DPT Physical Therapist- [facility]  06/16/23, 1:05 PM

## 2023-06-20 NOTE — Therapy (Unsigned)
 OUTPATIENT PHYSICAL THERAPY NEURO TREATMENT    Patient Name: Anthony Weber MRN: [id number] DOB:Aug 21, 1945, 78 y.o., male Today's Date: 06/21/2023   PCP: Jaclyn Shaggy, MD  REFERRING PROVIDER: Dolores Patty, MD   END OF SESSION:   PT End of Session - 06/21/23 1018     Visit Number 11    Number of Visits 24    Date for PT Re-Evaluation 08/02/23    Progress Note Due on Visit 10    PT Start Time 1018    PT Stop Time 1100    PT Time Calculation (min) 42 min    Equipment Utilized During Treatment Gait belt    Activity Tolerance Patient tolerated treatment well    Behavior During Therapy WFL for tasks assessed/performed                 Past Medical History:  Diagnosis Date   Depression    Hypertension    Hypothyroidism    Past Surgical History:  Procedure Laterality Date   COLONOSCOPY WITH PROPOFOL N/A 03/03/2015   Procedure: COLONOSCOPY WITH PROPOFOL;  Surgeon: Scot Jun, MD;  Location: [facility] ENDOSCOPY;  Service: Endoscopy;  Laterality: N/A;   EYE SURGERY     RIGHT/LEFT HEART CATH AND CORONARY ANGIOGRAPHY N/A 01/27/2021   Procedure: RIGHT/LEFT HEART CATH AND CORONARY ANGIOGRAPHY;  Surgeon: Yvonne Kendall, MD;  Location: [facility] INVASIVE CV LAB;  Service: Cardiovascular;  Laterality: N/A;   TEE WITHOUT CARDIOVERSION N/A 02/05/2021   Procedure: TRANSESOPHAGEAL ECHOCARDIOGRAM (TEE);  Surgeon: Dolores Patty, MD;  Location: [facility] ENDOSCOPY;  Service: Cardiovascular;  Laterality: N/A;   Patient Active Problem List   Diagnosis Date Noted   Hyponatremia    Acute on chronic combined systolic and diastolic congestive heart failure (HCC)    Acute on chronic respiratory failure with hypoxia and hypercapnia (HCC)    MSSA bacteremia 02/03/2021   Central line infection    Pneumonia of both lower lobes due to methicillin susceptible Staphylococcus aureus (MSSA) (HCC)    Cardiogenic shock (HCC) 01/29/2021   Primary hypertension    Acute HFrEF (heart  failure with reduced ejection fraction) (HCC)    Acute CHF (congestive heart failure) (HCC) 01/18/2021    ONSET DATE: 3 years.   REFERRING DIAG:  Diagnosis  R26.89 (ICD-10-CM) - Imbalance    THERAPY DIAG:  Unsteadiness on feet  Balance disorder  Imbalance  Rationale for Evaluation and Treatment: Rehabilitation  SUBJECTIVE:  SUBJECTIVE STATEMENT:  Pt reports his L shoulder is hurting "a little" today stating he might have done too much exercise. Reports having specific arm exercises he does at home. Denies stumbles/falls since last visit. Denies any medical changes.   Pt accompanied by: self  PERTINENT HISTORY:   From recent MD appointment. "Admitted to [facility] 01/2021  acute HFrEF. Echo  EF 25-30% from previous EF 60-65%. R/LHC w/ no CAD, moderately elevated left heart and pulmonary artery pressures, severely elevated right heart filling pressures, severely reduced Fick cardiac output/index.  Required intropes, pressor, and lasix drip.  Transferred to [facility] for cardiogenic/septic shock, + MSSA bacteremia. Underwent TEE showing EF 25-30%, RV moderately down, severe biatrial enlargement and moderate MR, no valvular vegetation. Required addition of mexitiline to suppress PVCs. Hospitalization c/b AKI and hypoxic/hypercarbic respiratory failure.   Discharged from [facility] 03/11/21 after he completed IV antibiotics and rehab.    At last visit midodrine stopped and GDMT started.    Sleep study 4/23 AHI 2   Here for routine f/u. Here for f/u with his wife. Says he is doing pretty well. 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 Walking 1 mile at the [place] almost everyday. And riding stationary bike for 30 mins every night."  PAIN:  Are you having pain? No  PRECAUTIONS: None  RED FLAGS: None   WEIGHT  BEARING RESTRICTIONS: No  FALLS: Has patient fallen in last 6 months? No  LIVING ENVIRONMENT: Lives with: lives with their spouse Lives in: House/apartment Stairs: Yes: Internal: 12 steps; on right going up and on left going up and External: 1 steps; on right going up and on left going up Has following equipment at home: Single point cane and does not use often    PLOF: Independent, Independent with basic ADLs, Independent with gait, and Independent with transfers  PATIENT GOALS: improve balance and strength in legs.   OBJECTIVE:  Note: Objective measures were completed at Evaluation unless otherwise noted.  DIAGNOSTIC FINDINGS:  None relevant   COGNITION: Overall cognitive status: Within functional limits for tasks assessed   SENSATION: Light touch: Impaired  and intermittent n/t in feet up to shins    COORDINATION: WFL   EDEMA:  Mild edema in bil LE   MUSCLE TONE: WFL   POSTURE: rounded shoulders and forward head  LOWER EXTREMITY ROM:     Grossly WFL  LOWER EXTREMITY MMT:    MMT Right Eval Left Eval  Hip flexion 4 4  Hip extension    Hip abduction 4+ 4+  Hip adduction 4 4  Hip internal rotation    Hip external rotation    Knee flexion 4+ 4+  Knee extension 4+ 4+  Ankle dorsiflexion 4+ 4+  Ankle plantarflexion    Ankle inversion    Ankle eversion    (Blank rows = not tested)  BED MOBILITY:  Sit to supine Complete Independence Supine to sit Complete Independence  TRANSFERS: Assistive device utilized: None  Sit to stand: Complete Independence Stand to sit: Complete Independence Chair to chair: Complete Independence Floor:  unable to complete .  Requires UE support topush from thighs to stand   RAMP:  Level of Assistance: Complete Independence Assistive device utilized:  Ramp Comments:   CURB:  Level of Assistance: Modified independence Assistive device utilized:  rail Curb Comments:   STAIRS: Level of Assistance: SBA Stair  Negotiation Technique: Step to Pattern Alternating Pattern  with Single Rail on Right Number of Stairs: 4  Height of Stairs:  6  Comments: step to on descent  GAIT: Gait pattern: lateral hip instability, trunk flexed, and wide BOS Distance walked: 915 Assistive device utilized: None Level of assistance: Complete Independence Comments: increased flexed posture with increased distance  FUNCTIONAL TESTS:  5 times sit to stand: 19.11 with UE support; 21.44 with UE support on knees.  Timed up and go (TUG): 15.64, 13.74 sec  6 minute walk test: 968ft  10 meter walk test: 12.43 and 11.68 Berg Balance Scale: TBD Dynamic Gait Index: TBD  PATIENT SURVEYS:  ABC scale 60                                                                                                                              TREATMENT DATE: DATE: 06/21/23  Unless otherwise stated, supervision/CGA was provided and gait belt donned in order to ensure pt safety throughout session.  Dynamic gait training ~476ft without rest break including: fast forward walking, forward walking with head rotations to identify targets, backwards walking ~45ft x2, and side stepping down/back 2x20-51ft each direction. Pt having greatest challenge throughout maintaining upright posture with excessive thoracic kyphosis causing minor anterior LOB tendency - cuing throughout for larger step lengths - min A for balance  Alternating foot taps to 4" step, no UE support, with CGA/light min A for steadying - pt with increasing confidence with this today - cuing to maintain upright posture, not flex trunk forward - progresses towards more consistent CGA.  Progressed to forward step-ups on green step x10reps per LE next to balance bar for safety, but pt did not need UE support after 1st trial - CGA/light min A for balance especially when stepping backwards down.   Dynamic standing balance reaching task with 6 Blaze Pods promoting forward, lateral, and  posterior diagonal reaching at various heights to challenge balance with turning/reaching - requires CGA/light min A for steadying with greatest challenge with posterior-diagonal reaching.  -- advanced to separating targets to promote patient taking forward/backwards and lateral steps at various angles to reach targets - requires light min A for balance - continues to have greatest difficult with rotating and stepping posteriorly *Performed each for 2 minutes  Forward/backwards stepping over yard stick x10reps per LE - slight increased difficulty stepping back leading with L LE compared to R but not significant Side stepping over yard stick x10reps - this was not sufficiently challenging for patient  Static standing balance with eyes closed, 30 seconds each, including:  - narrow BOS - semi-tandem (3/4) with pt having L anterior LOB with R foot forward and L posterior LOB with L foot forward *Requires CGA and min A to regain balance and maintain upright ** Pt continues to have significant difficulty with static balance, eyes closed, with narrowing of his BOS  Pt noted to have primarily anterior and L biased LOB during session today.   PATIENT EDUCATION: Education details: Purpose of functional outcome testing and education in HEP Person educated: Patient Education  method: Explanation Education comprehension: verbalized understanding  HOME EXERCISE PROGRAM:  Access Code: URL: [email or web address] Date: 06/07/2023 Prepared by: Grier Rocher  Exercises - Standing Tandem Balance with Counter Support  - 3 x weekly - 3 sets - up to 30 sec hold - Standing March with Counter Support  - 3 x weekly - 3 sets - 10 reps - Standing Single Leg Stance with Counter Support  - 3 x weekly - 3-5 sets - as long as possible hold - Standing 3-Way Leg Reach with Resistance at Ankles and Counter Support  - 1 x daily - 3 x weekly - 3 sets - 8 reps - Side Stepping with Counter Support   - 1 x daily - 3 x weekly - 3 sets - 3 reps - Backward Walking with Counter Support  - 1 x daily - 7 x weekly - 3 sets - 5 reps   GOALS: Goals reviewed with patient? Yes  SHORT TERM GOALS: Target date: 06/09/2023   Patient will be independent in home exercise program to improve strength/mobility for better functional independence with ADLs. Baseline: 06/07/23: HEP provided  06/16/2023: pt reports consistent compliance with HEP Goal status: IN PROGRESS   LONG TERM GOALS: Target date: 08/04/2023  Patient will increase ABC score to equal to or greater than 72  to demonstrate statistically significant improvement in mobility and quality of life.  Baseline: 60 06/16/2023: 55% Goal status: IN PROGRESS  2.  Patient (> 41 years old) will complete five times sit to stand test in < 15 seconds indicating an increased LE strength and improved balance. Baseline: 21.44 06/16/2023: 18.38 seconds no UE support Goal status: IN PROGRESS  3.  Patient will increase Berg Balance score by > 6 points to demonstrate decreased fall risk during functional activities Baseline: 1/30= 38/56 06/16/2023: 40/56 Goal status: IN PROGRESS  4.  Patient will increase 10 meter walk test to >1.52m/s as to improve gait speed for better community ambulation and to reduce fall risk. Baseline: 0.83 m/s 06/16/2023: 0.83 m/s (12 seconds) without AD Goal status: IN PROGRESS  5.  Patient will reduce timed up and go to <11 seconds to reduce fall risk and demonstrate improved transfer/gait ability. Baseline: 14 sec  06/16/2023: 12.57 seconds no AD Goal status: IN PROGRESS  6.  Patient will increase dynamic gait index score to >19/24 as to demonstrate reduced fall risk and improved dynamic gait balance for better safety with community/home ambulation.  Baseline: 05/12/23: 17 06/16/2023: 17/24 Goal status: IN PROGRESS    ASSESSMENT:  CLINICAL IMPRESSION:  Patient is a 77y.o. male who was seen today for physical therapy treatment for  impaired balance, B LE weakness, impaired endurance, and gait deficits. Patient arrived with good motivation to participate in physical therapy. Therapy session focused on increasing intensity of dynamic gait training with dual-task challenges of head rotations and variable stepping training. Pt noted to have excessive anterior trunk lean today due to fatigue in postural muscles to maintain thoracic extension for upright. Participated in dynamic balance challenges targeting turning/reaching, variable stepping, and narrowing BOS with decreased visual reliance. Pt continues to have greatest LOB with eyes closed tandem stance having L anterior vs posterior LOB. Patient remains highly motivated to participate in physical therapy with good compliance to HEP. Pt will continue to benefit from skilled physical therapy intervention to address impairments, improve QOL, and attain therapy goals.     OBJECTIVE IMPAIRMENTS: Abnormal gait, cardiopulmonary status limiting activity, decreased activity tolerance, decreased balance, decreased coordination, decreased  endurance, decreased knowledge of use of DME, decreased mobility, difficulty walking, decreased ROM, decreased strength, hypomobility, impaired perceived functional ability, impaired flexibility, impaired sensation, improper body mechanics, postural dysfunction, and obesity.   ACTIVITY LIMITATIONS: carrying, lifting, bending, standing, squatting, stairs, transfers, and locomotion level  PARTICIPATION LIMITATIONS: cleaning, laundry, community activity, and occupation  PERSONAL FACTORS: Age and 1-2 comorbidities: obesity CHF  are also affecting patient's functional outcome.   REHAB POTENTIAL: Good  CLINICAL DECISION MAKING: Stable/uncomplicated  EVALUATION COMPLEXITY: Moderate  PLAN:  PT FREQUENCY: 1-2x/week  PT DURATION: 12 weeks  PLANNED INTERVENTIONS: 97110-Therapeutic exercises, 97530- Therapeutic activity, 97112- Neuromuscular re-education,  97535- Self Care, 16109- Manual therapy, [phone]- Gait training, Patient/Family education, Balance training, Stair training, Taping, DME instructions, Cryotherapy, and Moist heat  PLAN FOR NEXT SESSION:  - review and update/modify HEP  - add scap retractions and postural stretching - static balance with eyes closed  - 3/4 tandem (has L LOB) - dynamic gait training including:  - quick turning   - stepping over obstacles  - agility ladder - dynamic balance  - stepping over larger object than yard stick      Tashika Goodin Verdell Face PT ,DPT Physical Therapist- [facility]  [facility]  06/21/23, 6:00 PM

## 2023-06-21 ENCOUNTER — Ambulatory Visit: Payer: PPO | Admitting: Physical Therapy

## 2023-06-21 DIAGNOSIS — R2689 Other abnormalities of gait and mobility: Secondary | ICD-10-CM

## 2023-06-21 DIAGNOSIS — R2681 Unsteadiness on feet: Secondary | ICD-10-CM | POA: Diagnosis not present

## 2023-06-23 ENCOUNTER — Ambulatory Visit: Payer: PPO

## 2023-06-23 ENCOUNTER — Ambulatory Visit: Payer: PPO | Admitting: Physical Therapy

## 2023-06-23 DIAGNOSIS — R2689 Other abnormalities of gait and mobility: Secondary | ICD-10-CM

## 2023-06-23 DIAGNOSIS — R2681 Unsteadiness on feet: Secondary | ICD-10-CM

## 2023-06-23 NOTE — Therapy (Signed)
 OUTPATIENT PHYSICAL THERAPY NEURO TREATMENT    Patient Name: Anthony Weber MRN: [id number] DOB:11-Oct-1945, 78 y.o., male Today's Date: 06/23/2023   PCP: Jaclyn Shaggy, MD  REFERRING PROVIDER: Dolores Patty, MD   END OF SESSION:   PT End of Session - 06/23/23 1150     Visit Number 12    Number of Visits 24    Date for PT Re-Evaluation 08/02/23    Progress Note Due on Visit 10    PT Start Time 1150    PT Stop Time 1230    PT Time Calculation (min) 40 min    Equipment Utilized During Treatment Gait belt    Activity Tolerance Patient tolerated treatment well    Behavior During Therapy WFL for tasks assessed/performed                  Past Medical History:  Diagnosis Date   Depression    Hypertension    Hypothyroidism    Past Surgical History:  Procedure Laterality Date   COLONOSCOPY WITH PROPOFOL N/A 03/03/2015   Procedure: COLONOSCOPY WITH PROPOFOL;  Surgeon: Scot Jun, MD;  Location: [facility] ENDOSCOPY;  Service: Endoscopy;  Laterality: N/A;   EYE SURGERY     RIGHT/LEFT HEART CATH AND CORONARY ANGIOGRAPHY N/A 01/27/2021   Procedure: RIGHT/LEFT HEART CATH AND CORONARY ANGIOGRAPHY;  Surgeon: Yvonne Kendall, MD;  Location: [facility] INVASIVE CV LAB;  Service: Cardiovascular;  Laterality: N/A;   TEE WITHOUT CARDIOVERSION N/A 02/05/2021   Procedure: TRANSESOPHAGEAL ECHOCARDIOGRAM (TEE);  Surgeon: Dolores Patty, MD;  Location: [facility] ENDOSCOPY;  Service: Cardiovascular;  Laterality: N/A;   Patient Active Problem List   Diagnosis Date Noted   Hyponatremia    Acute on chronic combined systolic and diastolic congestive heart failure (HCC)    Acute on chronic respiratory failure with hypoxia and hypercapnia (HCC)    MSSA bacteremia 02/03/2021   Central line infection    Pneumonia of both lower lobes due to methicillin susceptible Staphylococcus aureus (MSSA) (HCC)    Cardiogenic shock (HCC) 01/29/2021   Primary hypertension    Acute HFrEF (heart  failure with reduced ejection fraction) (HCC)    Acute CHF (congestive heart failure) (HCC) 01/18/2021    ONSET DATE: 3 years.   REFERRING DIAG:  Diagnosis  R26.89 (ICD-10-CM) - Imbalance    THERAPY DIAG:  Unsteadiness on feet  Balance disorder  Imbalance  Rationale for Evaluation and Treatment: Rehabilitation  SUBJECTIVE:  SUBJECTIVE STATEMENT:  Pt reports he is doing "fine." Reports he hasn't done much exercise so his L shoulder is "doing alright, right now."   Pt accompanied by: self  PERTINENT HISTORY:   From recent MD appointment. "Admitted to [facility] 01/2021  acute HFrEF. Echo  EF 25-30% from previous EF 60-65%. R/LHC w/ no CAD, moderately elevated left heart and pulmonary artery pressures, severely elevated right heart filling pressures, severely reduced Fick cardiac output/index.  Required intropes, pressor, and lasix drip.  Transferred to [facility] for cardiogenic/septic shock, + MSSA bacteremia. Underwent TEE showing EF 25-30%, RV moderately down, severe biatrial enlargement and moderate MR, no valvular vegetation. Required addition of mexitiline to suppress PVCs. Hospitalization c/b AKI and hypoxic/hypercarbic respiratory failure.   Discharged from [facility] 03/11/21 after he completed IV antibiotics and rehab.    At last visit midodrine stopped and GDMT started.    Sleep study 4/23 AHI 2   Here for routine f/u. Here for f/u with his wife. Says he is doing pretty well. 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 Walking 1 mile at the [place] almost everyday. And riding stationary bike for 30 mins every night."  PAIN:  Are you having pain? No  PRECAUTIONS: None  RED FLAGS: None   WEIGHT BEARING RESTRICTIONS: No  FALLS: Has patient fallen in last 6 months? No  LIVING ENVIRONMENT: Lives with:  lives with their spouse Lives in: House/apartment Stairs: Yes: Internal: 12 steps; on right going up and on left going up and External: 1 steps; on right going up and on left going up Has following equipment at home: Single point cane and does not use often    PLOF: Independent, Independent with basic ADLs, Independent with gait, and Independent with transfers  PATIENT GOALS: improve balance and strength in legs.   OBJECTIVE:  Note: Objective measures were completed at Evaluation unless otherwise noted.  DIAGNOSTIC FINDINGS:  None relevant   COGNITION: Overall cognitive status: Within functional limits for tasks assessed   SENSATION: Light touch: Impaired  and intermittent n/t in feet up to shins    COORDINATION: WFL   EDEMA:  Mild edema in bil LE   MUSCLE TONE: WFL   POSTURE: rounded shoulders and forward head  LOWER EXTREMITY ROM:     Grossly WFL  LOWER EXTREMITY MMT:    MMT Right Eval Left Eval  Hip flexion 4 4  Hip extension    Hip abduction 4+ 4+  Hip adduction 4 4  Hip internal rotation    Hip external rotation    Knee flexion 4+ 4+  Knee extension 4+ 4+  Ankle dorsiflexion 4+ 4+  Ankle plantarflexion    Ankle inversion    Ankle eversion    (Blank rows = not tested)  BED MOBILITY:  Sit to supine Complete Independence Supine to sit Complete Independence  TRANSFERS: Assistive device utilized: None  Sit to stand: Complete Independence Stand to sit: Complete Independence Chair to chair: Complete Independence Floor:  unable to complete .  Requires UE support topush from thighs to stand   RAMP:  Level of Assistance: Complete Independence Assistive device utilized:  Ramp Comments:   CURB:  Level of Assistance: Modified independence Assistive device utilized:  rail Curb Comments:   STAIRS: Level of Assistance: SBA Stair Negotiation Technique: Step to Pattern Alternating Pattern  with Single Rail on Right Number of Stairs: 4  Height of  Stairs: 6  Comments: step to on descent  GAIT: Gait pattern: lateral hip instability, trunk  flexed, and wide BOS Distance walked: 915 Assistive device utilized: None Level of assistance: Complete Independence Comments: increased flexed posture with increased distance  FUNCTIONAL TESTS:  5 times sit to stand: 19.11 with UE support; 21.44 with UE support on knees.  Timed up and go (TUG): 15.64, 13.74 sec  6 minute walk test: 967ft  10 meter walk test: 12.43 and 11.68 Berg Balance Scale: TBD Dynamic Gait Index: TBD  PATIENT SURVEYS:  ABC scale 60                                                                                                                              TREATMENT DATE: DATE: 06/23/23  Unless otherwise stated, supervision/CGA was provided and gait belt donned in order to ensure pt safety throughout session.  Dynamic gait training ~557ft without rest break including:  - fast forward walking - forward walking with head rotations to identify targets on wall and up close held up by therapist - backwards walking ~19ft x2 progressed to visual scanning to identify targets on wall - side stepping down/back 2x20-28ft each direction. ** Pt having greatest challenge throughout maintaining upright posture with excessive thoracic kyphosis causing minor anterior LOB tendency, improved today compared to last visit ** Cuing throughout for larger step lengths  ** Min A for balance, especially during backwards stepping with pt having short, shuffled steps  Performed each of the below exercises to ensure proper form/technique and safe set-up for home environment:  - Doorway Pec Stretch at 60 Elevation  2x 1 minute hold - Standing Shoulder Row with Anchored Resistance (Green level 3 theraband resistance)  2x 10 reps - Standing "Ys" x 10 reps - Standing Tandem Balance with Counter Support (used L HHA today) x 30 sec hold each - Standing Near Stance in [place] with Eyes Closed x30  seconds hold  Tried seated thoracic extension in chair, but pt with incorrect form requiring repeated cuing to improve, so did not include it on his HEP.  Provided pt updated HEP printout and provided level 3 green theraband for use at home. Discussed safety precautions with performing HEP at home. Discussed standing in corner with chair in front for safety and patient verbalized understanding.  PATIENT EDUCATION: Education details: Purpose of functional outcome testing and education in HEP Person educated: Patient Education method: Explanation Education comprehension: verbalized understanding  HOME EXERCISE PROGRAM:  Access Code: URL: [email or web address] Date: 06/23/2023 Prepared by: Casimiro Needle  Exercises - Doorway Pec Stretch at 60 Elevation  - 1 x daily - 7 x weekly - 2 sets - 1 minute hold - Standing Shoulder Row with Anchored Resistance  - 1 x daily - 7 x weekly - 3 sets - 10 reps - Low Trap Setting at Wall  - 1 x daily - 7 x weekly - 2 sets - 10 reps - Standing Tandem Balance with Counter Support  - 3 x weekly - 2 sets - 30 sec hold -  Standing Near Stance in [place] with Eyes Closed  - 1 x daily - 7 x weekly - 2 sets - 30 seconds hold - Standing Single Leg Stance with Counter Support  - 3 x weekly - 3-5 sets - as long as possible hold - Standing March with Counter Support  - 3 x weekly - 3 sets - 10 reps - Side Stepping with Counter Support  - 1 x daily - 3 x weekly - 3 sets - 3 reps - Backward Walking with Counter Support  - 1 x daily - 7 x weekly - 3 sets - 5 reps   GOALS: Goals reviewed with patient? Yes  SHORT TERM GOALS: Target date: 06/09/2023   Patient will be independent in home exercise program to improve strength/mobility for better functional independence with ADLs. Baseline: 06/07/23: HEP provided  06/16/2023: pt reports consistent compliance with HEP 06/23/2023: updated HEP provided Goal status: IN PROGRESS   LONG TERM GOALS: Target  date: 08/04/2023  Patient will increase ABC score to equal to or greater than 72  to demonstrate statistically significant improvement in mobility and quality of life.  Baseline: 60 06/16/2023: 55% Goal status: IN PROGRESS  2.  Patient (> 1 years old) will complete five times sit to stand test in < 15 seconds indicating an increased LE strength and improved balance. Baseline: 21.44 06/16/2023: 18.38 seconds no UE support Goal status: IN PROGRESS  3.  Patient will increase Berg Balance score by > 6 points to demonstrate decreased fall risk during functional activities Baseline: 1/30= 38/56 06/16/2023: 40/56 Goal status: IN PROGRESS  4.  Patient will increase 10 meter walk test to >1.46m/s as to improve gait speed for better community ambulation and to reduce fall risk. Baseline: 0.83 m/s 06/16/2023: 0.83 m/s (12 seconds) without AD Goal status: IN PROGRESS  5.  Patient will reduce timed up and go to <11 seconds to reduce fall risk and demonstrate improved transfer/gait ability. Baseline: 14 sec  06/16/2023: 12.57 seconds no AD Goal status: IN PROGRESS  6.  Patient will increase dynamic gait index score to >19/24 as to demonstrate reduced fall risk and improved dynamic gait balance for better safety with community/home ambulation.  Baseline: 05/12/23: 17 06/16/2023: 17/24 Goal status: IN PROGRESS    ASSESSMENT:  CLINICAL IMPRESSION:  Patient is a 77y.o. male who was seen today for physical therapy treatment for impaired balance, B LE weakness, impaired endurance, postural abnormalities, and gait deficits. Patient arrived with good motivation to participate in physical therapy. Therapy session focused on increasing intensity of dynamic gait training with dual-task challenges of head rotations and variable direction stepping. Pt continues to have excessive anterior trunk lean today due to fatigue in postural muscles lacking ability to maintain thoracic extension for upright posture; however,  improved slightly from last visit. Participated in standing thoracic stretches and strengthening exercises to promote improved upright posture to decrease anterior LOB bias. Participated in balance challenges targeting narrowing BOS with decreased visual reliance. Therapist provided pt with updated HEP. Pt continues to have greatest LOB with eyes closed. Patient remains highly motivated to participate in physical therapy with good compliance to HEP. Pt will continue to benefit from skilled physical therapy intervention to address impairments, improve QOL, and attain therapy goals.     OBJECTIVE IMPAIRMENTS: Abnormal gait, cardiopulmonary status limiting activity, decreased activity tolerance, decreased balance, decreased coordination, decreased endurance, decreased knowledge of use of DME, decreased mobility, difficulty walking, decreased ROM, decreased strength, hypomobility, impaired perceived functional ability, impaired flexibility,  impaired sensation, improper body mechanics, postural dysfunction, and obesity.   ACTIVITY LIMITATIONS: carrying, lifting, bending, standing, squatting, stairs, transfers, and locomotion level  PARTICIPATION LIMITATIONS: cleaning, laundry, community activity, and occupation  PERSONAL FACTORS: Age and 1-2 comorbidities: obesity CHF  are also affecting patient's functional outcome.   REHAB POTENTIAL: Good  CLINICAL DECISION MAKING: Stable/uncomplicated  EVALUATION COMPLEXITY: Moderate  PLAN:  PT FREQUENCY: 1-2x/week  PT DURATION: 12 weeks  PLANNED INTERVENTIONS: 97110-Therapeutic exercises, 97530- Therapeutic activity, 97112- Neuromuscular re-education, 97535- Self Care, 16109- Manual therapy, [phone]- Gait training, Patient/Family education, Balance training, Stair training, Taping, DME instructions, Cryotherapy, and Moist heat  PLAN FOR NEXT SESSION:  - review updated HEP to ensure no questions  - add scap retractions and postural stretching - static  balance with eyes closed  - 3/4 tandem (has L LOB) - dynamic gait training including:  - quick turning   - stepping over obstacles  - agility ladder - dynamic balance  - stepping over larger object than yard stick     Kendell Sagraves Verdell Face PT ,DPT Physical Therapist- [facility]  [facility]  06/23/23, 12:54 PM

## 2023-06-28 ENCOUNTER — Ambulatory Visit: Payer: PPO

## 2023-06-28 DIAGNOSIS — R2689 Other abnormalities of gait and mobility: Secondary | ICD-10-CM

## 2023-06-28 DIAGNOSIS — R2681 Unsteadiness on feet: Secondary | ICD-10-CM

## 2023-06-28 NOTE — Therapy (Signed)
 OUTPATIENT PHYSICAL THERAPY NEURO TREATMENT    Patient Name: Anthony Weber MRN: [id number] DOB:1945-05-20, 78 y.o., male Today's Date: 06/29/2023   PCP: Jaclyn Shaggy, MD  REFERRING PROVIDER: Dolores Patty, MD   END OF SESSION:   PT End of Session - 06/28/23 1518     Visit Number 13    Number of Visits 24    Date for PT Re-Evaluation 08/02/23    Progress Note Due on Visit 10    PT Start Time 1518    PT Stop Time 1603    PT Time Calculation (min) 45 min    Equipment Utilized During Treatment Gait belt    Activity Tolerance Patient tolerated treatment well    Behavior During Therapy WFL for tasks assessed/performed                   Past Medical History:  Diagnosis Date   Depression    Hypertension    Hypothyroidism    Past Surgical History:  Procedure Laterality Date   COLONOSCOPY WITH PROPOFOL N/A 03/03/2015   Procedure: COLONOSCOPY WITH PROPOFOL;  Surgeon: Scot Jun, MD;  Location: [facility] ENDOSCOPY;  Service: Endoscopy;  Laterality: N/A;   EYE SURGERY     RIGHT/LEFT HEART CATH AND CORONARY ANGIOGRAPHY N/A 01/27/2021   Procedure: RIGHT/LEFT HEART CATH AND CORONARY ANGIOGRAPHY;  Surgeon: Yvonne Kendall, MD;  Location: [facility] INVASIVE CV LAB;  Service: Cardiovascular;  Laterality: N/A;   TEE WITHOUT CARDIOVERSION N/A 02/05/2021   Procedure: TRANSESOPHAGEAL ECHOCARDIOGRAM (TEE);  Surgeon: Dolores Patty, MD;  Location: [facility] ENDOSCOPY;  Service: Cardiovascular;  Laterality: N/A;   Patient Active Problem List   Diagnosis Date Noted   Hyponatremia    Acute on chronic combined systolic and diastolic congestive heart failure (HCC)    Acute on chronic respiratory failure with hypoxia and hypercapnia (HCC)    MSSA bacteremia 02/03/2021   Central line infection    Pneumonia of both lower lobes due to methicillin susceptible Staphylococcus aureus (MSSA) (HCC)    Cardiogenic shock (HCC) 01/29/2021   Primary hypertension    Acute HFrEF (heart  failure with reduced ejection fraction) (HCC)    Acute CHF (congestive heart failure) (HCC) 01/18/2021    ONSET DATE: 3 years.   REFERRING DIAG:  Diagnosis  R26.89 (ICD-10-CM) - Imbalance    THERAPY DIAG:  Unsteadiness on feet  Balance disorder  Imbalance  Rationale for Evaluation and Treatment: Rehabilitation  SUBJECTIVE:  SUBJECTIVE STATEMENT:  Patient reports doing okay. Denies any falls- Reports he can't see much improvement to date. States his biggest deficit remains some LE weakness.    Pt accompanied by: self  PERTINENT HISTORY:   From recent MD appointment. "Admitted to [facility] 01/2021  acute HFrEF. Echo  EF 25-30% from previous EF 60-65%. R/LHC w/ no CAD, moderately elevated left heart and pulmonary artery pressures, severely elevated right heart filling pressures, severely reduced Fick cardiac output/index.  Required intropes, pressor, and lasix drip.  Transferred to [facility] for cardiogenic/septic shock, + MSSA bacteremia. Underwent TEE showing EF 25-30%, RV moderately down, severe biatrial enlargement and moderate MR, no valvular vegetation. Required addition of mexitiline to suppress PVCs. Hospitalization c/b AKI and hypoxic/hypercarbic respiratory failure.   Discharged from [facility] 03/11/21 after he completed IV antibiotics and rehab.    At last visit midodrine stopped and GDMT started.    Sleep study 4/23 AHI 2   Here for routine f/u. Here for f/u with his wife. Says he is doing pretty well. 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 Walking 1 mile at the [place] almost everyday. And riding stationary bike for 30 mins every night."  PAIN:  Are you having pain? No  PRECAUTIONS: None  RED FLAGS: None   WEIGHT BEARING RESTRICTIONS: No  FALLS: Has patient fallen in last 6 months?  No  LIVING ENVIRONMENT: Lives with: lives with their spouse Lives in: House/apartment Stairs: Yes: Internal: 12 steps; on right going up and on left going up and External: 1 steps; on right going up and on left going up Has following equipment at home: Single point cane and does not use often    PLOF: Independent, Independent with basic ADLs, Independent with gait, and Independent with transfers  PATIENT GOALS: improve balance and strength in legs.   OBJECTIVE:  Note: Objective measures were completed at Evaluation unless otherwise noted.  DIAGNOSTIC FINDINGS:  None relevant   COGNITION: Overall cognitive status: Within functional limits for tasks assessed   SENSATION: Light touch: Impaired  and intermittent n/t in feet up to shins    COORDINATION: WFL   EDEMA:  Mild edema in bil LE   MUSCLE TONE: WFL   POSTURE: rounded shoulders and forward head  LOWER EXTREMITY ROM:     Grossly WFL  LOWER EXTREMITY MMT:    MMT Right Eval Left Eval  Hip flexion 4 4  Hip extension    Hip abduction 4+ 4+  Hip adduction 4 4  Hip internal rotation    Hip external rotation    Knee flexion 4+ 4+  Knee extension 4+ 4+  Ankle dorsiflexion 4+ 4+  Ankle plantarflexion    Ankle inversion    Ankle eversion    (Blank rows = not tested)  BED MOBILITY:  Sit to supine Complete Independence Supine to sit Complete Independence  TRANSFERS: Assistive device utilized: None  Sit to stand: Complete Independence Stand to sit: Complete Independence Chair to chair: Complete Independence Floor:  unable to complete .  Requires UE support topush from thighs to stand   RAMP:  Level of Assistance: Complete Independence Assistive device utilized:  Ramp Comments:   CURB:  Level of Assistance: Modified independence Assistive device utilized:  rail Curb Comments:   STAIRS: Level of Assistance: SBA Stair Negotiation Technique: Step to Pattern Alternating Pattern  with Single Rail on  Right Number of Stairs: 4  Height of Stairs: 6  Comments: step to on descent  GAIT: Gait pattern: lateral  hip instability, trunk flexed, and wide BOS Distance walked: 915 Assistive device utilized: None Level of assistance: Complete Independence Comments: increased flexed posture with increased distance  FUNCTIONAL TESTS:  5 times sit to stand: 19.11 with UE support; 21.44 with UE support on knees.  Timed up and go (TUG): 15.64, 13.74 sec  6 minute walk test: 946ft  10 meter walk test: 12.43 and 11.68 Berg Balance Scale: TBD Dynamic Gait Index: TBD  PATIENT SURVEYS:  ABC scale 60                                                                                                                              TREATMENT DATE: DATE: 06/29/23   NMR: Unless otherwise stated, supervision/CGA was provided and gait belt donned in order to ensure pt safety throughout session.   Postural Awareness activities:  Scap retract with GTB - 2 sets x 12 reps Should ext with GTB - 2 sets x 12 reps Horizontal shoulder ABD- active x 15 reps   Therapeutic activities-Dynamic activities designed to achieve improved functional performance  Step ups onto 6 in step without UE support x 15 reps ea LE Dead lifts at wall using 15# KB x 12 reps (VC for correct technique) Forward/retro lunge walk in // bars - down & back x 3.  Calf walk (up onto toes) x length of bar and back  x 4 Heel walk (toes up) x length of bar and back x 2.  Lateral steps down and back in // bars with Min to No UE support x 2 trips.    PATIENT EDUCATION: Education details: Purpose of functional outcome testing and education in HEP Person educated: Patient Education method: Explanation Education comprehension: verbalized understanding  HOME EXERCISE PROGRAM:  Access Code: URL: [email or web address] Date: 06/23/2023 Prepared by: Casimiro Needle  Exercises - Doorway Pec Stretch at 60 Elevation  - 1 x  daily - 7 x weekly - 2 sets - 1 minute hold - Standing Shoulder Row with Anchored Resistance  - 1 x daily - 7 x weekly - 3 sets - 10 reps - Low Trap Setting at Wall  - 1 x daily - 7 x weekly - 2 sets - 10 reps - Standing Tandem Balance with Counter Support  - 3 x weekly - 2 sets - 30 sec hold - Standing Near Stance in Corner with Eyes Closed  - 1 x daily - 7 x weekly - 2 sets - 30 seconds hold - Standing Single Leg Stance with Counter Support  - 3 x weekly - 3-5 sets - as long as possible hold - Standing March with Counter Support  - 3 x weekly - 3 sets - 10 reps - Side Stepping with Counter Support  - 1 x daily - 3 x weekly - 3 sets - 3 reps - Backward Walking with Counter Support  - 1 x daily - 7 x weekly - 3 sets - 5 reps  GOALS: Goals reviewed with patient? Yes  SHORT TERM GOALS: Target date: 06/09/2023   Patient will be independent in home exercise program to improve strength/mobility for better functional independence with ADLs. Baseline: 06/07/23: HEP provided  06/16/2023: pt reports consistent compliance with HEP 06/23/2023: updated HEP provided Goal status: IN PROGRESS   LONG TERM GOALS: Target date: 08/04/2023  Patient will increase ABC score to equal to or greater than 72  to demonstrate statistically significant improvement in mobility and quality of life.  Baseline: 60 06/16/2023: 55% Goal status: IN PROGRESS  2.  Patient (> 79 years old) will complete five times sit to stand test in < 15 seconds indicating an increased LE strength and improved balance. Baseline: 21.44 06/16/2023: 18.38 seconds no UE support Goal status: IN PROGRESS  3.  Patient will increase Berg Balance score by > 6 points to demonstrate decreased fall risk during functional activities Baseline: 1/30= 38/56 06/16/2023: 40/56 Goal status: IN PROGRESS  4.  Patient will increase 10 meter walk test to >1.49m/s as to improve gait speed for better community ambulation and to reduce fall risk. Baseline: 0.83  m/s 06/16/2023: 0.83 m/s (12 seconds) without AD Goal status: IN PROGRESS  5.  Patient will reduce timed up and go to <11 seconds to reduce fall risk and demonstrate improved transfer/gait ability. Baseline: 14 sec  06/16/2023: 12.57 seconds no AD Goal status: IN PROGRESS  6.  Patient will increase dynamic gait index score to >19/24 as to demonstrate reduced fall risk and improved dynamic gait balance for better safety with community/home ambulation.  Baseline: 05/12/23: 17 06/16/2023: 17/24 Goal status: IN PROGRESS    ASSESSMENT:  CLINICAL IMPRESSION:  Patient is a 77y.o. male who was seen today for physical therapy treatment for impaired balance, B LE weakness, impaired endurance, postural abnormalities, and gait deficits. Treatment continued with some postural activities to promote improved upright posture and to decrease anterior LOB bias. Treatment also focused on on some basic LE strengthening dynamic activities as patient reported feeling like weakness was still his biggest issue. He performed well overall- able to complete all reps/sets and activities with minimal VC for correct technique.  Pt will continue to benefit from skilled physical therapy intervention to address impairments, improve QOL, and attain therapy goals.     OBJECTIVE IMPAIRMENTS: Abnormal gait, cardiopulmonary status limiting activity, decreased activity tolerance, decreased balance, decreased coordination, decreased endurance, decreased knowledge of use of DME, decreased mobility, difficulty walking, decreased ROM, decreased strength, hypomobility, impaired perceived functional ability, impaired flexibility, impaired sensation, improper body mechanics, postural dysfunction, and obesity.   ACTIVITY LIMITATIONS: carrying, lifting, bending, standing, squatting, stairs, transfers, and locomotion level  PARTICIPATION LIMITATIONS: cleaning, laundry, community activity, and occupation  PERSONAL FACTORS: Age and 1-2  comorbidities: obesity CHF  are also affecting patient's functional outcome.   REHAB POTENTIAL: Good  CLINICAL DECISION MAKING: Stable/uncomplicated  EVALUATION COMPLEXITY: Moderate  PLAN:  PT FREQUENCY: 1-2x/week  PT DURATION: 12 weeks  PLANNED INTERVENTIONS: 97110-Therapeutic exercises, 97530- Therapeutic activity, 97112- Neuromuscular re-education, 97535- Self Care, [id number]- Manual therapy, [phone]- Gait training, Patient/Family education, Balance training, Stair training, Taping, DME instructions, Cryotherapy, and Moist heat  PLAN FOR NEXT SESSION:  - review updated HEP to ensure no questions  - add scap retractions and postural stretching - static balance with eyes closed  - 3/4 tandem (has L LOB) - dynamic gait training including:  - quick turning   - stepping over obstacles  - agility ladder - dynamic balance  - stepping over larger  object than yard stick     Lenda Kelp PT , Physical Therapist- [facility]  06/29/23, 8:17 AM

## 2023-06-30 ENCOUNTER — Ambulatory Visit: Payer: PPO | Admitting: Physical Therapy

## 2023-06-30 ENCOUNTER — Ambulatory Visit: Payer: PPO

## 2023-06-30 DIAGNOSIS — R2681 Unsteadiness on feet: Secondary | ICD-10-CM | POA: Diagnosis not present

## 2023-06-30 DIAGNOSIS — R2689 Other abnormalities of gait and mobility: Secondary | ICD-10-CM

## 2023-06-30 NOTE — Therapy (Signed)
 OUTPATIENT PHYSICAL THERAPY NEURO TREATMENT    Patient Name: Anthony Weber MRN: [id number] DOB:05-06-1945, 78 y.o., male Today's Date: 06/30/2023   PCP: Jaclyn Shaggy, MD  REFERRING PROVIDER: Dolores Patty, MD   END OF SESSION:   PT End of Session - 06/30/23 1148     Visit Number 14    Number of Visits 24    Date for PT Re-Evaluation 08/02/23    Progress Note Due on Visit 10    PT Start Time 1149    PT Stop Time 1229    PT Time Calculation (min) 40 min    Equipment Utilized During Treatment Gait belt    Activity Tolerance Patient tolerated treatment well    Behavior During Therapy WFL for tasks assessed/performed                    Past Medical History:  Diagnosis Date   Depression    Hypertension    Hypothyroidism    Past Surgical History:  Procedure Laterality Date   COLONOSCOPY WITH PROPOFOL N/A 03/03/2015   Procedure: COLONOSCOPY WITH PROPOFOL;  Surgeon: Scot Jun, MD;  Location: [facility] ENDOSCOPY;  Service: Endoscopy;  Laterality: N/A;   EYE SURGERY     RIGHT/LEFT HEART CATH AND CORONARY ANGIOGRAPHY N/A 01/27/2021   Procedure: RIGHT/LEFT HEART CATH AND CORONARY ANGIOGRAPHY;  Surgeon: Yvonne Kendall, MD;  Location: [facility] INVASIVE CV LAB;  Service: Cardiovascular;  Laterality: N/A;   TEE WITHOUT CARDIOVERSION N/A 02/05/2021   Procedure: TRANSESOPHAGEAL ECHOCARDIOGRAM (TEE);  Surgeon: Dolores Patty, MD;  Location: [facility] ENDOSCOPY;  Service: Cardiovascular;  Laterality: N/A;   Patient Active Problem List   Diagnosis Date Noted   Hyponatremia    Acute on chronic combined systolic and diastolic congestive heart failure (HCC)    Acute on chronic respiratory failure with hypoxia and hypercapnia (HCC)    MSSA bacteremia 02/03/2021   Central line infection    Pneumonia of both lower lobes due to methicillin susceptible Staphylococcus aureus (MSSA) (HCC)    Cardiogenic shock (HCC) 01/29/2021   Primary hypertension    Acute HFrEF (heart  failure with reduced ejection fraction) (HCC)    Acute CHF (congestive heart failure) (HCC) 01/18/2021    ONSET DATE: 3 years.   REFERRING DIAG:  Diagnosis  R26.89 (ICD-10-CM) - Imbalance    THERAPY DIAG:  Unsteadiness on feet  Balance disorder  Imbalance  Rationale for Evaluation and Treatment: Rehabilitation  SUBJECTIVE:  SUBJECTIVE STATEMENT:  Pt report he is doing "about the same...no better, no worse." Pt reports he is trying to do his HEP every day. Pt reports he doesn't walk as much as he is supposed to stating he was tired yesterday. Pt reports he still also does his recumbent bike at home.  Pt accompanied by: self  PERTINENT HISTORY:   From recent MD appointment. "Admitted to [facility] 01/2021  acute HFrEF. Echo  EF 25-30% from previous EF 60-65%. R/LHC w/ no CAD, moderately elevated left heart and pulmonary artery pressures, severely elevated right heart filling pressures, severely reduced Fick cardiac output/index.  Required intropes, pressor, and lasix drip.  Transferred to [facility] for cardiogenic/septic shock, + MSSA bacteremia. Underwent TEE showing EF 25-30%, RV moderately down, severe biatrial enlargement and moderate MR, no valvular vegetation. Required addition of mexitiline to suppress PVCs. Hospitalization c/b AKI and hypoxic/hypercarbic respiratory failure.   Discharged from [facility] 03/11/21 after he completed IV antibiotics and rehab.    At last visit midodrine stopped and GDMT started.    Sleep study 4/23 AHI 2   Here for routine f/u. Here for f/u with his wife. Says he is doing pretty well. 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 Walking 1 mile at the [place] almost everyday. And riding stationary bike for 30 mins every night."  PAIN:  Are you having pain? No  PRECAUTIONS:  None  RED FLAGS: None   WEIGHT BEARING RESTRICTIONS: No  FALLS: Has patient fallen in last 6 months? No  LIVING ENVIRONMENT: Lives with: lives with their spouse Lives in: House/apartment Stairs: Yes: Internal: 12 steps; on right going up and on left going up and External: 1 steps; on right going up and on left going up Has following equipment at home: Single point cane and does not use often    PLOF: Independent, Independent with basic ADLs, Independent with gait, and Independent with transfers  PATIENT GOALS: improve balance and strength in legs.   OBJECTIVE:  Note: Objective measures were completed at Evaluation unless otherwise noted.  DIAGNOSTIC FINDINGS:  None relevant   COGNITION: Overall cognitive status: Within functional limits for tasks assessed   SENSATION: Light touch: Impaired  and intermittent n/t in feet up to shins    COORDINATION: WFL   EDEMA:  Mild edema in bil LE   MUSCLE TONE: WFL   POSTURE: rounded shoulders and forward head  LOWER EXTREMITY ROM:     Grossly WFL  LOWER EXTREMITY MMT:    MMT Right Eval Left Eval  Hip flexion 4 4  Hip extension    Hip abduction 4+ 4+  Hip adduction 4 4  Hip internal rotation    Hip external rotation    Knee flexion 4+ 4+  Knee extension 4+ 4+  Ankle dorsiflexion 4+ 4+  Ankle plantarflexion    Ankle inversion    Ankle eversion    (Blank rows = not tested)  BED MOBILITY:  Sit to supine Complete Independence Supine to sit Complete Independence  TRANSFERS: Assistive device utilized: None  Sit to stand: Complete Independence Stand to sit: Complete Independence Chair to chair: Complete Independence Floor:  unable to complete .  Requires UE support topush from thighs to stand   RAMP:  Level of Assistance: Complete Independence Assistive device utilized:  Ramp Comments:   CURB:  Level of Assistance: Modified independence Assistive device utilized:  rail Curb Comments:   STAIRS: Level  of Assistance: SBA Stair Negotiation Technique: Step to Pattern Alternating Pattern  with Single Rail on Right Number of Stairs: 4  Height of Stairs: 6  Comments: step to on descent  GAIT: Gait pattern: lateral hip instability, trunk flexed, and wide BOS Distance walked: 915 Assistive device utilized: None Level of assistance: Complete Independence Comments: increased flexed posture with increased distance  FUNCTIONAL TESTS:  5 times sit to stand: 19.11 with UE support; 21.44 with UE support on knees.  Timed up and go (TUG): 15.64, 13.74 sec  6 minute walk test: 978ft  10 meter walk test: 12.43 and 11.68 Berg Balance Scale: TBD Dynamic Gait Index: TBD  PATIENT SURVEYS:  ABC scale 60                                                                                                                              TREATMENT DATE: DATE: 06/30/23  Unless otherwise stated, supervision/CGA was provided and gait belt donned in order to ensure pt safety throughout session.  Gait training ~334ft focused on fast forward walking with dual-task challenge of head rotations to identify targets on walls. Including ~69ft backwards gait x2 progressed to also having head rotation dual-task challenge with pt having more instability with this requiring intermittent min A.  Dynamic gait training through obstacle course including:  - stepping forward over hurdle - stepping up, on, and over green step  - agility ladder including:  - forward reciprocal pattern   - side stepping  *Pt requires min A for balance throughout the above challenges  Hurdle was too challenging so downgraded to 1/2 foam roll including following stepping challenges: - forward/backwards  - lateral stepping *Requires heavy min A for balance especially when backwards stepping with poor ability to weight shift and maintain single leg stance to bring foot backwards *This was very challenging for patient and would benefit from further  practice  Static standing with 1 foot up on brown step with airex pad on top 49min30sec each LE - with dual-task challenge using Blaze Pods on table top having 2 colors to tap each UE to promote cross body reaching - requires heavier min A especially when standing on L LE due to increased instability and L lean.  Pt continues to have excessive anterior trunk flexion with L anterior lean bias, but this is improving.  Static standing balance:  - 3/4 tandem stance with eyes open progressed to eyes closed 2x30sec each LE  *requires frequent min A to maintain upright due to L>R anterior LOB    PATIENT EDUCATION: Education details: Purpose of functional outcome testing and education in HEP Person educated: Patient Education method: Explanation Education comprehension: verbalized understanding  HOME EXERCISE PROGRAM:  Access Code: URL: [email or web address] Date: 06/23/2023 Prepared by: Casimiro Needle  Exercises - Doorway Pec Stretch at 60 Elevation  - 1 x daily - 7 x weekly - 2 sets - 1 minute hold - Standing Shoulder Row with Anchored Resistance  - 1 x daily - 7 x weekly - 3  sets - 10 reps - Low Trap Setting at Wall  - 1 x daily - 7 x weekly - 2 sets - 10 reps - Standing Tandem Balance with Counter Support  - 3 x weekly - 2 sets - 30 sec hold - Standing Near Stance in Corner with Eyes Closed  - 1 x daily - 7 x weekly - 2 sets - 30 seconds hold - Standing Single Leg Stance with Counter Support  - 3 x weekly - 3-5 sets - as long as possible hold - Standing March with Counter Support  - 3 x weekly - 3 sets - 10 reps - Side Stepping with Counter Support  - 1 x daily - 3 x weekly - 3 sets - 3 reps - Backward Walking with Counter Support  - 1 x daily - 7 x weekly - 3 sets - 5 reps   GOALS: Goals reviewed with patient? Yes  SHORT TERM GOALS: Target date: 06/09/2023   Patient will be independent in home exercise program to improve strength/mobility for better  functional independence with ADLs. Baseline: 06/07/23: HEP provided  06/16/2023: pt reports consistent compliance with HEP 06/23/2023: updated HEP provided Goal status: IN PROGRESS   LONG TERM GOALS: Target date: 08/04/2023  Patient will increase ABC score to equal to or greater than 72  to demonstrate statistically significant improvement in mobility and quality of life.  Baseline: 60 06/16/2023: 55% Goal status: IN PROGRESS  2.  Patient (> 7 years old) will complete five times sit to stand test in < 15 seconds indicating an increased LE strength and improved balance. Baseline: 21.44 06/16/2023: 18.38 seconds no UE support Goal status: IN PROGRESS  3.  Patient will increase Berg Balance score by > 6 points to demonstrate decreased fall risk during functional activities Baseline: 1/30= 38/56 06/16/2023: 40/56 Goal status: IN PROGRESS  4.  Patient will increase 10 meter walk test to >1.43m/s as to improve gait speed for better community ambulation and to reduce fall risk. Baseline: 0.83 m/s 06/16/2023: 0.83 m/s (12 seconds) without AD Goal status: IN PROGRESS  5.  Patient will reduce timed up and go to <11 seconds to reduce fall risk and demonstrate improved transfer/gait ability. Baseline: 14 sec  06/16/2023: 12.57 seconds no AD Goal status: IN PROGRESS  6.  Patient will increase dynamic gait index score to >19/24 as to demonstrate reduced fall risk and improved dynamic gait balance for better safety with community/home ambulation.  Baseline: 05/12/23: 17 06/16/2023: 17/24 Goal status: IN PROGRESS    ASSESSMENT:  CLINICAL IMPRESSION:   Patient is a 77y.o. male who was seen today for physical therapy treatment for impaired balance, B LE weakness, impaired endurance, postural abnormalities, and gait deficits. Therapy session focusing on dynamic gait training with dual-task challenges as well as balance interventions targeting increased time on single-limb-stance and variable direction  stepping over obstacles. Pt continues to demonstrate significant balance deficits requiring min A throughout to maintain upright with predominantly L anterior lean/LOB bias. Pt also continues to have significant challenges with eyes closed balance challenges. Pt will continue to benefit from skilled physical therapy intervention to address impairments, improve QOL, and attain therapy goals.     OBJECTIVE IMPAIRMENTS: Abnormal gait, cardiopulmonary status limiting activity, decreased activity tolerance, decreased balance, decreased coordination, decreased endurance, decreased knowledge of use of DME, decreased mobility, difficulty walking, decreased ROM, decreased strength, hypomobility, impaired perceived functional ability, impaired flexibility, impaired sensation, improper body mechanics, postural dysfunction, and obesity.   ACTIVITY LIMITATIONS: carrying,  lifting, bending, standing, squatting, stairs, transfers, and locomotion level  PARTICIPATION LIMITATIONS: cleaning, laundry, community activity, and occupation  PERSONAL FACTORS: Age and 1-2 comorbidities: obesity CHF  are also affecting patient's functional outcome.   REHAB POTENTIAL: Good  CLINICAL DECISION MAKING: Stable/uncomplicated  EVALUATION COMPLEXITY: Moderate  PLAN:  PT FREQUENCY: 1-2x/week  PT DURATION: 12 weeks  PLANNED INTERVENTIONS: 97110-Therapeutic exercises, 97530- Therapeutic activity, 97112- Neuromuscular re-education, 97535- Self Care, [id number]- Manual therapy, [phone]- Gait training, Patient/Family education, Balance training, Stair training, Taping, DME instructions, Cryotherapy, and Moist heat  PLAN FOR NEXT SESSION:  - review updated HEP to ensure no questions  - add scap retractions and postural stretching - static balance with eyes closed  - 3/4 tandem (has L LOB) - dynamic gait training including:  - quick turning   - stepping over obstacles  - agility ladder - dynamic balance  - stepping over 1/2 foam  roll  - 1 foot up on step to progress towards single leg stance with dual-task challenge     Kajsa Butrum Verdell Face PT , Physical Therapist- [facility]  [facility]  06/30/23, 12:54 PM

## 2023-07-05 ENCOUNTER — Ambulatory Visit: Payer: PPO

## 2023-07-05 DIAGNOSIS — R2689 Other abnormalities of gait and mobility: Secondary | ICD-10-CM

## 2023-07-05 DIAGNOSIS — R2681 Unsteadiness on feet: Secondary | ICD-10-CM

## 2023-07-05 NOTE — Therapy (Signed)
 OUTPATIENT PHYSICAL THERAPY NEURO TREATMENT    Patient Name: Anthony Weber MRN: [id number] DOB:[birth year], 78 y.o., male Today's Date: 07/06/2023   PCP: Jaclyn Shaggy, MD  REFERRING PROVIDER: Dolores Patty, MD   END OF SESSION:   PT End of Session - 07/05/23 1513     Visit Number 15    Number of Visits 24    Date for PT Re-Evaluation 08/02/23    Progress Note Due on Visit 10    PT Start Time 1530    PT Stop Time 1613    PT Time Calculation (min) 43 min    Equipment Utilized During Treatment Gait belt    Activity Tolerance Patient tolerated treatment well    Behavior During Therapy WFL for tasks assessed/performed                    Past Medical History:  Diagnosis Date   Depression    Hypertension    Hypothyroidism    Past Surgical History:  Procedure Laterality Date   COLONOSCOPY WITH PROPOFOL N/A 03/03/2015   Procedure: COLONOSCOPY WITH PROPOFOL;  Surgeon: Scot Jun, MD;  Location: [facility] ENDOSCOPY;  Service: Endoscopy;  Laterality: N/A;   EYE SURGERY     RIGHT/LEFT HEART CATH AND CORONARY ANGIOGRAPHY N/A 01/27/2021   Procedure: RIGHT/LEFT HEART CATH AND CORONARY ANGIOGRAPHY;  Surgeon: Yvonne Kendall, MD;  Location: [facility] INVASIVE CV LAB;  Service: Cardiovascular;  Laterality: N/A;   TEE WITHOUT CARDIOVERSION N/A 02/05/2021   Procedure: TRANSESOPHAGEAL ECHOCARDIOGRAM (TEE);  Surgeon: Dolores Patty, MD;  Location: [facility] ENDOSCOPY;  Service: Cardiovascular;  Laterality: N/A;   Patient Active Problem List   Diagnosis Date Noted   Hyponatremia    Acute on chronic combined systolic and diastolic congestive heart failure (HCC)    Acute on chronic respiratory failure with hypoxia and hypercapnia (HCC)    MSSA bacteremia 02/03/2021   Central line infection    Pneumonia of both lower lobes due to methicillin susceptible Staphylococcus aureus (MSSA) (HCC)    Cardiogenic shock (HCC) 01/29/2021   Primary hypertension    Acute HFrEF (heart  failure with reduced ejection fraction) (HCC)    Acute CHF (congestive heart failure) (HCC) 01/18/2021    ONSET DATE: 3 years.   REFERRING DIAG:  Diagnosis  R26.89 (ICD-10-CM) - Imbalance    THERAPY DIAG:  Unsteadiness on feet  Balance disorder  Imbalance  Rationale for Evaluation and Treatment: Rehabilitation  SUBJECTIVE:  SUBJECTIVE STATEMENT: Patient reports he has been using his recumbent bike at work some.   Pt accompanied by: self  PERTINENT HISTORY:   From recent MD appointment. "Admitted to [facility] 01/2021  acute HFrEF. Echo  EF 25-30% from previous EF 60-65%. R/LHC w/ no CAD, moderately elevated left heart and pulmonary artery pressures, severely elevated right heart filling pressures, severely reduced Fick cardiac output/index.  Required intropes, pressor, and lasix drip.  Transferred to [facility] for cardiogenic/septic shock, + MSSA bacteremia. Underwent TEE showing EF 25-30%, RV moderately down, severe biatrial enlargement and moderate MR, no valvular vegetation. Required addition of mexitiline to suppress PVCs. Hospitalization c/b AKI and hypoxic/hypercarbic respiratory failure.   Discharged from [facility] 03/11/21 after he completed IV antibiotics and rehab.    At last visit midodrine stopped and GDMT started.    Sleep study 4/23 AHI 2   Here for routine f/u. Here for f/u with his wife. Says he is doing pretty well. 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 Walking 1 mile at the [place] almost everyday. And riding stationary bike for 30 mins every night."  PAIN:  Are you having pain? No  PRECAUTIONS: None  RED FLAGS: None   WEIGHT BEARING RESTRICTIONS: No  FALLS: Has patient fallen in last 6 months? No  LIVING ENVIRONMENT: Lives with: lives with their spouse Lives in:  House/apartment Stairs: Yes: Internal: 12 steps; on right going up and on left going up and External: 1 steps; on right going up and on left going up Has following equipment at home: Single point cane and does not use often    PLOF: Independent, Independent with basic ADLs, Independent with gait, and Independent with transfers  PATIENT GOALS: improve balance and strength in legs.   OBJECTIVE:  Note: Objective measures were completed at Evaluation unless otherwise noted.  DIAGNOSTIC FINDINGS:  None relevant   COGNITION: Overall cognitive status: Within functional limits for tasks assessed   SENSATION: Light touch: Impaired  and intermittent n/t in feet up to shins    COORDINATION: WFL   EDEMA:  Mild edema in bil LE   MUSCLE TONE: WFL   POSTURE: rounded shoulders and forward head  LOWER EXTREMITY ROM:     Grossly WFL  LOWER EXTREMITY MMT:    MMT Right Eval Left Eval  Hip flexion 4 4  Hip extension    Hip abduction 4+ 4+  Hip adduction 4 4  Hip internal rotation    Hip external rotation    Knee flexion 4+ 4+  Knee extension 4+ 4+  Ankle dorsiflexion 4+ 4+  Ankle plantarflexion    Ankle inversion    Ankle eversion    (Blank rows = not tested)  BED MOBILITY:  Sit to supine Complete Independence Supine to sit Complete Independence  TRANSFERS: Assistive device utilized: None  Sit to stand: Complete Independence Stand to sit: Complete Independence Chair to chair: Complete Independence Floor:  unable to complete .  Requires UE support topush from thighs to stand   RAMP:  Level of Assistance: Complete Independence Assistive device utilized:  Ramp Comments:   CURB:  Level of Assistance: Modified independence Assistive device utilized:  rail Curb Comments:   STAIRS: Level of Assistance: SBA Stair Negotiation Technique: Step to Pattern Alternating Pattern  with Single Rail on Right Number of Stairs: 4  Height of Stairs: 6  Comments: step to on  descent  GAIT: Gait pattern: lateral hip instability, trunk flexed, and wide BOS Distance walked: 915 Assistive device utilized:  None Level of assistance: Complete Independence Comments: increased flexed posture with increased distance  FUNCTIONAL TESTS:  5 times sit to stand: 19.11 with UE support; 21.44 with UE support on knees.  Timed up and go (TUG): 15.64, 13.74 sec  6 minute walk test: 941ft  10 meter walk test: 12.43 and 11.68 Berg Balance Scale: TBD Dynamic Gait Index: TBD  PATIENT SURVEYS:  ABC scale 60                                                                                                                              TREATMENT DATE: DATE: 07/05/2023  Unless otherwise stated, supervision/CGA was provided and gait belt donned in order to ensure pt safety throughout session. NMR: - static balance with eyes closed up to 30 sec x 3 - 3/4 tandem (able to hold 30 sec) x 2 then progressed to full tandem- hold 30 sec x 3.  - dynamic gait training including: - quick turning - walk 10 feet- then quick turn - practicing turning left and right (180 deg)  - forward walking x 10 feet counting steps- 6  - Retro walking x 10 feet counting step- 10-13 steps Lateral stepping over 1/2 foam x 20   -Step taps 20 reps without UE support - 4 cones approx 6x6 feet - working on forward/backward x several min        PATIENT EDUCATION: Education details: Purpose of functional outcome testing and education in HEP Person educated: Patient Education method: Explanation Education comprehension: verbalized understanding  HOME EXERCISE PROGRAM:  Access Code: URL: [email or web address] Date: 06/23/2023 Prepared by: Casimiro Needle  Exercises - Doorway Pec Stretch at 60 Elevation  - 1 x daily - 7 x weekly - 2 sets - 1 minute hold - Standing Shoulder Row with Anchored Resistance  - 1 x daily - 7 x weekly - 3 sets - 10 reps - Low Trap Setting at Wall  - 1 x  daily - 7 x weekly - 2 sets - 10 reps - Standing Tandem Balance with Counter Support  - 3 x weekly - 2 sets - 30 sec hold - Standing Near Stance in Corner with Eyes Closed  - 1 x daily - 7 x weekly - 2 sets - 30 seconds hold - Standing Single Leg Stance with Counter Support  - 3 x weekly - 3-5 sets - as long as possible hold - Standing March with Counter Support  - 3 x weekly - 3 sets - 10 reps - Side Stepping with Counter Support  - 1 x daily - 3 x weekly - 3 sets - 3 reps - Backward Walking with Counter Support  - 1 x daily - 7 x weekly - 3 sets - 5 reps   GOALS: Goals reviewed with patient? Yes  SHORT TERM GOALS: Target date: 06/09/2023   Patient will be independent in home exercise program to improve strength/mobility for better functional independence with ADLs. Baseline:  06/07/23: HEP provided  06/16/2023: pt reports consistent compliance with HEP 06/23/2023: updated HEP provided Goal status: IN PROGRESS   LONG TERM GOALS: Target date: 08/04/2023  Patient will increase ABC score to equal to or greater than 72  to demonstrate statistically significant improvement in mobility and quality of life.  Baseline: 60 06/16/2023: 55% Goal status: IN PROGRESS  2.  Patient (> 37 years old) will complete five times sit to stand test in < 15 seconds indicating an increased LE strength and improved balance. Baseline: 21.44 06/16/2023: 18.38 seconds no UE support Goal status: IN PROGRESS  3.  Patient will increase Berg Balance score by > 6 points to demonstrate decreased fall risk during functional activities Baseline: 1/30= 38/56 06/16/2023: 40/56 Goal status: IN PROGRESS  4.  Patient will increase 10 meter walk test to >1.63m/s as to improve gait speed for better community ambulation and to reduce fall risk. Baseline: 0.83 m/s 06/16/2023: 0.83 m/s (12 seconds) without AD Goal status: IN PROGRESS  5.  Patient will reduce timed up and go to <11 seconds to reduce fall risk and demonstrate improved  transfer/gait ability. Baseline: 14 sec  06/16/2023: 12.57 seconds no AD Goal status: IN PROGRESS  6.  Patient will increase dynamic gait index score to >19/24 as to demonstrate reduced fall risk and improved dynamic gait balance for better safety with community/home ambulation.  Baseline: 05/12/23: 17 06/16/2023: 17/24 Goal status: IN PROGRESS    ASSESSMENT:  CLINICAL IMPRESSION:   Patient is a 77y.o. male who was seen today for physical therapy treatment for impaired balance, B LE weakness, impaired endurance, postural abnormalities, and gait deficits. Therapy session focusing on dynamic gait training coordination with some single limb balance. Patient continues to demonstrate some poor overall ability to stand on one leg and continued difficulty with eyes closed Pt will continue to benefit from skilled physical therapy intervention to address impairments, improve QOL, and attain therapy goals.     OBJECTIVE IMPAIRMENTS: Abnormal gait, cardiopulmonary status limiting activity, decreased activity tolerance, decreased balance, decreased coordination, decreased endurance, decreased knowledge of use of DME, decreased mobility, difficulty walking, decreased ROM, decreased strength, hypomobility, impaired perceived functional ability, impaired flexibility, impaired sensation, improper body mechanics, postural dysfunction, and obesity.   ACTIVITY LIMITATIONS: carrying, lifting, bending, standing, squatting, stairs, transfers, and locomotion level  PARTICIPATION LIMITATIONS: cleaning, laundry, community activity, and occupation  PERSONAL FACTORS: Age and 1-2 comorbidities: obesity CHF  are also affecting patient's functional outcome.   REHAB POTENTIAL: Good  CLINICAL DECISION MAKING: Stable/uncomplicated  EVALUATION COMPLEXITY: Moderate  PLAN:  PT FREQUENCY: 1-2x/week  PT DURATION: 12 weeks  PLANNED INTERVENTIONS: 97110-Therapeutic exercises, 97530- Therapeutic activity, 97112-  Neuromuscular re-education, 97535- Self Care, 16109- Manual therapy, [phone]- Gait training, Patient/Family education, Balance training, Stair training, Taping, DME instructions, Cryotherapy, and Moist heat  PLAN FOR NEXT SESSION:  - Dyanmic balance interventions    Lenda Kelp PT , Physical Therapist- [facility]  07/06/23, 10:44 PM

## 2023-07-07 ENCOUNTER — Encounter: Payer: Self-pay | Admitting: Physical Therapy

## 2023-07-07 ENCOUNTER — Ambulatory Visit: Payer: PPO

## 2023-07-07 DIAGNOSIS — R2681 Unsteadiness on feet: Secondary | ICD-10-CM | POA: Diagnosis not present

## 2023-07-07 DIAGNOSIS — R2689 Other abnormalities of gait and mobility: Secondary | ICD-10-CM

## 2023-07-07 NOTE — Therapy (Signed)
 OUTPATIENT PHYSICAL THERAPY NEURO TREATMENT    Patient Name: Anthony Weber MRN: [id number] DOB:09-May-1945, 79 y.o., male Today's Date: 07/07/2023   PCP: Jaclyn Shaggy, MD  REFERRING PROVIDER: Dolores Patty, MD   END OF SESSION:   PT End of Session - 07/07/23 1142     Visit Number 16    Number of Visits 24    Date for PT Re-Evaluation 08/02/23    Progress Note Due on Visit 10    PT Start Time 1145    PT Stop Time 1226    PT Time Calculation (min) 41 min    Equipment Utilized During Treatment Gait belt    Activity Tolerance Patient tolerated treatment well    Behavior During Therapy WFL for tasks assessed/performed                     Past Medical History:  Diagnosis Date   Depression    Hypertension    Hypothyroidism    Past Surgical History:  Procedure Laterality Date   COLONOSCOPY WITH PROPOFOL N/A 03/03/2015   Procedure: COLONOSCOPY WITH PROPOFOL;  Surgeon: Scot Jun, MD;  Location: [facility] ENDOSCOPY;  Service: Endoscopy;  Laterality: N/A;   EYE SURGERY     RIGHT/LEFT HEART CATH AND CORONARY ANGIOGRAPHY N/A 01/27/2021   Procedure: RIGHT/LEFT HEART CATH AND CORONARY ANGIOGRAPHY;  Surgeon: Yvonne Kendall, MD;  Location: [facility] INVASIVE CV LAB;  Service: Cardiovascular;  Laterality: N/A;   TEE WITHOUT CARDIOVERSION N/A 02/05/2021   Procedure: TRANSESOPHAGEAL ECHOCARDIOGRAM (TEE);  Surgeon: Dolores Patty, MD;  Location: [facility] ENDOSCOPY;  Service: Cardiovascular;  Laterality: N/A;   Patient Active Problem List   Diagnosis Date Noted   Hyponatremia    Acute on chronic combined systolic and diastolic congestive heart failure (HCC)    Acute on chronic respiratory failure with hypoxia and hypercapnia (HCC)    MSSA bacteremia 02/03/2021   Central line infection    Pneumonia of both lower lobes due to methicillin susceptible Staphylococcus aureus (MSSA) (HCC)    Cardiogenic shock (HCC) 01/29/2021   Primary hypertension    Acute HFrEF  (heart failure with reduced ejection fraction) (HCC)    Acute CHF (congestive heart failure) (HCC) 01/18/2021    ONSET DATE: 3 years.   REFERRING DIAG:  Diagnosis  R26.89 (ICD-10-CM) - Imbalance    THERAPY DIAG:  Unsteadiness on feet  Balance disorder  Imbalance  Rationale for Evaluation and Treatment: Rehabilitation  SUBJECTIVE:  SUBJECTIVE STATEMENT:   Patient reports he is doing well this AM but neck is hurting.   Pt accompanied by: self  PERTINENT HISTORY:   From recent MD appointment. "Admitted to [facility] 01/2021  acute HFrEF. Echo  EF 25-30% from previous EF 60-65%. R/LHC w/ no CAD, moderately elevated left heart and pulmonary artery pressures, severely elevated right heart filling pressures, severely reduced Fick cardiac output/index.  Required intropes, pressor, and lasix drip.  Transferred to [facility] for cardiogenic/septic shock, + MSSA bacteremia. Underwent TEE showing EF 25-30%, RV moderately down, severe biatrial enlargement and moderate MR, no valvular vegetation. Required addition of mexitiline to suppress PVCs. Hospitalization c/b AKI and hypoxic/hypercarbic respiratory failure.   Discharged from [facility] 03/11/21 after he completed IV antibiotics and rehab.    At last visit midodrine stopped and GDMT started.    Sleep study 4/23 AHI 2   Here for routine f/u. Here for f/u with his wife. Says he is doing pretty well. 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 Walking 1 mile at the [place] almost everyday. And riding stationary bike for 30 mins every night."  PAIN:  Are you having pain? No  PRECAUTIONS: None  RED FLAGS: None   WEIGHT BEARING RESTRICTIONS: No  FALLS: Has patient fallen in last 6 months? No  LIVING ENVIRONMENT: Lives with: lives with their spouse Lives in:  House/apartment Stairs: Yes: Internal: 12 steps; on right going up and on left going up and External: 1 steps; on right going up and on left going up Has following equipment at home: Single point cane and does not use often    PLOF: Independent, Independent with basic ADLs, Independent with gait, and Independent with transfers  PATIENT GOALS: improve balance and strength in legs.   OBJECTIVE:  Note: Objective measures were completed at Evaluation unless otherwise noted.  DIAGNOSTIC FINDINGS:  None relevant   COGNITION: Overall cognitive status: Within functional limits for tasks assessed   SENSATION: Light touch: Impaired  and intermittent n/t in feet up to shins    COORDINATION: WFL   EDEMA:  Mild edema in bil LE   MUSCLE TONE: WFL   POSTURE: rounded shoulders and forward head  LOWER EXTREMITY ROM:     Grossly WFL  LOWER EXTREMITY MMT:    MMT Right Eval Left Eval  Hip flexion 4 4  Hip extension    Hip abduction 4+ 4+  Hip adduction 4 4  Hip internal rotation    Hip external rotation    Knee flexion 4+ 4+  Knee extension 4+ 4+  Ankle dorsiflexion 4+ 4+  Ankle plantarflexion    Ankle inversion    Ankle eversion    (Blank rows = not tested)  BED MOBILITY:  Sit to supine Complete Independence Supine to sit Complete Independence  TRANSFERS: Assistive device utilized: None  Sit to stand: Complete Independence Stand to sit: Complete Independence Chair to chair: Complete Independence Floor:  unable to complete .  Requires UE support topush from thighs to stand   RAMP:  Level of Assistance: Complete Independence Assistive device utilized:  Ramp Comments:   CURB:  Level of Assistance: Modified independence Assistive device utilized:  rail Curb Comments:   STAIRS: Level of Assistance: SBA Stair Negotiation Technique: Step to Pattern Alternating Pattern  with Single Rail on Right Number of Stairs: 4  Height of Stairs: 6  Comments: step to on  descent  GAIT: Gait pattern: lateral hip instability, trunk flexed, and wide BOS Distance walked: 915 Assistive  device utilized: None Level of assistance: Complete Independence Comments: increased flexed posture with increased distance  FUNCTIONAL TESTS:  5 times sit to stand: 19.11 with UE support; 21.44 with UE support on knees.  Timed up and go (TUG): 15.64, 13.74 sec  6 minute walk test: 95ft  10 meter walk test: 12.43 and 11.68 Berg Balance Scale: TBD Dynamic Gait Index: TBD  PATIENT SURVEYS:  ABC scale 60                                                                                                                              TREATMENT DATE: DATE: 07/07/23   Unless otherwise stated, supervision/CGA was provided and gait belt donned in order to ensure pt safety throughout session.  Sit to stand 2 x 12 from green chair and no UE support  6" step taps with no UE support 2 x 12 each LE  Fwd/bwd step over 1/2 bolster x 10 leading with each LE  Lateral step over red theraband in floor x 10 leading with each LE  Quick turning - walk 10 feet- then quick turn - practicing turning left and right (180 deg) x 4 laps Forward walking 3 x 10 feet  Retro walking 3 x 10 feet Standing heel/toe raises x 20 each LE  Standing hip abduction 2 x 10 each LE    PATIENT EDUCATION: Education details: Purpose of functional outcome testing and education in HEP Person educated: Patient Education method: Explanation Education comprehension: verbalized understanding  HOME EXERCISE PROGRAM:  Access Code: URL: [email or web address] Date: 06/23/2023 Prepared by: Casimiro Needle  Exercises - Doorway Pec Stretch at 60 Elevation  - 1 x daily - 7 x weekly - 2 sets - 1 minute hold - Standing Shoulder Row with Anchored Resistance  - 1 x daily - 7 x weekly - 3 sets - 10 reps - Low Trap Setting at Wall  - 1 x daily - 7 x weekly - 2 sets - 10 reps - Standing Tandem Balance with  Counter Support  - 3 x weekly - 2 sets - 30 sec hold - Standing Near Stance in Corner with Eyes Closed  - 1 x daily - 7 x weekly - 2 sets - 30 seconds hold - Standing Single Leg Stance with Counter Support  - 3 x weekly - 3-5 sets - as long as possible hold - Standing March with Counter Support  - 3 x weekly - 3 sets - 10 reps - Side Stepping with Counter Support  - 1 x daily - 3 x weekly - 3 sets - 3 reps - Backward Walking with Counter Support  - 1 x daily - 7 x weekly - 3 sets - 5 reps   GOALS: Goals reviewed with patient? Yes  SHORT TERM GOALS: Target date: 06/09/2023   Patient will be independent in home exercise program to improve strength/mobility for better functional independence with ADLs. Baseline: 06/07/23: HEP provided  06/16/2023:  pt reports consistent compliance with HEP 06/23/2023: updated HEP provided Goal status: IN PROGRESS   LONG TERM GOALS: Target date: 08/04/2023  Patient will increase ABC score to equal to or greater than 72  to demonstrate statistically significant improvement in mobility and quality of life.  Baseline: 60 06/16/2023: 55% Goal status: IN PROGRESS  2.  Patient (> 106 years old) will complete five times sit to stand test in < 15 seconds indicating an increased LE strength and improved balance. Baseline: 21.44 06/16/2023: 18.38 seconds no UE support Goal status: IN PROGRESS  3.  Patient will increase Berg Balance score by > 6 points to demonstrate decreased fall risk during functional activities Baseline: 1/30= 38/56 06/16/2023: 40/56 Goal status: IN PROGRESS  4.  Patient will increase 10 meter walk test to >1.48m/s as to improve gait speed for better community ambulation and to reduce fall risk. Baseline: 0.83 m/s 06/16/2023: 0.83 m/s (12 seconds) without AD Goal status: IN PROGRESS  5.  Patient will reduce timed up and go to <11 seconds to reduce fall risk and demonstrate improved transfer/gait ability. Baseline: 14 sec  06/16/2023: 12.57 seconds no  AD Goal status: IN PROGRESS  6.  Patient will increase dynamic gait index score to >19/24 as to demonstrate reduced fall risk and improved dynamic gait balance for better safety with community/home ambulation.  Baseline: 05/12/23: 17 06/16/2023: 17/24 Goal status: IN PROGRESS    ASSESSMENT:  CLINICAL IMPRESSION:   Session focused on dynamic balance activities and BLE strengthening. Continues to have difficulty with single leg activities requiring up to minA for balance. Pt will continue to benefit from skilled physical therapy intervention to address impairments, improve QOL, and attain therapy goals.  OBJECTIVE IMPAIRMENTS: Abnormal gait, cardiopulmonary status limiting activity, decreased activity tolerance, decreased balance, decreased coordination, decreased endurance, decreased knowledge of use of DME, decreased mobility, difficulty walking, decreased ROM, decreased strength, hypomobility, impaired perceived functional ability, impaired flexibility, impaired sensation, improper body mechanics, postural dysfunction, and obesity.   ACTIVITY LIMITATIONS: carrying, lifting, bending, standing, squatting, stairs, transfers, and locomotion level  PARTICIPATION LIMITATIONS: cleaning, laundry, community activity, and occupation  PERSONAL FACTORS: Age and 1-2 comorbidities: obesity CHF  are also affecting patient's functional outcome.   REHAB POTENTIAL: Good  CLINICAL DECISION MAKING: Stable/uncomplicated  EVALUATION COMPLEXITY: Moderate  PLAN:  PT FREQUENCY: 1-2x/week  PT DURATION: 12 weeks  PLANNED INTERVENTIONS: 97110-Therapeutic exercises, 97530- Therapeutic activity, 97112- Neuromuscular re-education, 97535- Self Care, 16109- Manual therapy, [phone]- Gait training, Patient/Family education, Balance training, Stair training, Taping, DME instructions, Cryotherapy, and Moist heat  PLAN FOR NEXT SESSION:  - Dyanmic balance interventions   Maylon Peppers, PT, DPT Physical Therapist-  [place]  [facility]  07/07/23, 11:43 AM

## 2023-07-12 ENCOUNTER — Ambulatory Visit: Payer: PPO | Attending: Internal Medicine | Admitting: Physical Therapy

## 2023-07-12 ENCOUNTER — Ambulatory Visit: Payer: PPO | Admitting: Physical Therapy

## 2023-07-12 DIAGNOSIS — R2689 Other abnormalities of gait and mobility: Secondary | ICD-10-CM | POA: Diagnosis not present

## 2023-07-12 DIAGNOSIS — R262 Difficulty in walking, not elsewhere classified: Secondary | ICD-10-CM | POA: Diagnosis not present

## 2023-07-12 DIAGNOSIS — R2681 Unsteadiness on feet: Secondary | ICD-10-CM | POA: Insufficient documentation

## 2023-07-12 DIAGNOSIS — M6281 Muscle weakness (generalized): Secondary | ICD-10-CM | POA: Diagnosis not present

## 2023-07-12 NOTE — Therapy (Unsigned)
 OUTPATIENT PHYSICAL THERAPY NEURO TREATMENT    Patient Name: Anthony Weber MRN: [id number] DOB:11/07/1945, 78 y.o., male Today's Date: 07/12/2023   PCP: Jaclyn Shaggy, MD  REFERRING PROVIDER: Dolores Patty, MD   END OF SESSION:   PT End of Session - 07/12/23 1534     Visit Number 17    Number of Visits 24    Date for PT Re-Evaluation 08/02/23    Progress Note Due on Visit 10    PT Start Time 1530    PT Stop Time 1610    PT Time Calculation (min) 40 min    Equipment Utilized During Treatment Gait belt    Activity Tolerance Patient tolerated treatment well    Behavior During Therapy WFL for tasks assessed/performed                     Past Medical History:  Diagnosis Date   Depression    Hypertension    Hypothyroidism    Past Surgical History:  Procedure Laterality Date   COLONOSCOPY WITH PROPOFOL N/A 03/03/2015   Procedure: COLONOSCOPY WITH PROPOFOL;  Surgeon: Scot Jun, MD;  Location: [facility] ENDOSCOPY;  Service: Endoscopy;  Laterality: N/A;   EYE SURGERY     RIGHT/LEFT HEART CATH AND CORONARY ANGIOGRAPHY N/A 01/27/2021   Procedure: RIGHT/LEFT HEART CATH AND CORONARY ANGIOGRAPHY;  Surgeon: Yvonne Kendall, MD;  Location: [facility] INVASIVE CV LAB;  Service: Cardiovascular;  Laterality: N/A;   TEE WITHOUT CARDIOVERSION N/A 02/05/2021   Procedure: TRANSESOPHAGEAL ECHOCARDIOGRAM (TEE);  Surgeon: Dolores Patty, MD;  Location: [facility] ENDOSCOPY;  Service: Cardiovascular;  Laterality: N/A;   Patient Active Problem List   Diagnosis Date Noted   Hyponatremia    Acute on chronic combined systolic and diastolic congestive heart failure (HCC)    Acute on chronic respiratory failure with hypoxia and hypercapnia (HCC)    MSSA bacteremia 02/03/2021   Central line infection    Pneumonia of both lower lobes due to methicillin susceptible Staphylococcus aureus (MSSA) (HCC)    Cardiogenic shock (HCC) 01/29/2021   Primary hypertension    Acute HFrEF  (heart failure with reduced ejection fraction) (HCC)    Acute CHF (congestive heart failure) (HCC) 01/18/2021    ONSET DATE: 3 years.   REFERRING DIAG:  Diagnosis  R26.89 (ICD-10-CM) - Imbalance    THERAPY DIAG:  Unsteadiness on feet  Balance disorder  Imbalance  Rationale for Evaluation and Treatment: Rehabilitation  SUBJECTIVE:  SUBJECTIVE STATEMENT:   Patient reports that he feels a little sluggish today, and that his BP might be a little low.  No back, chest, or L arm pain noted. Spent a lot of time outside over the weekend.   Pt accompanied by: self  PERTINENT HISTORY:   From recent MD appointment. "Admitted to [facility] 01/2021  acute HFrEF. Echo  EF 25-30% from previous EF 60-65%. R/LHC w/ no CAD, moderately elevated left heart and pulmonary artery pressures, severely elevated right heart filling pressures, severely reduced Fick cardiac output/index.  Required intropes, pressor, and lasix drip.  Transferred to [facility] for cardiogenic/septic shock, + MSSA bacteremia. Underwent TEE showing EF 25-30%, RV moderately down, severe biatrial enlargement and moderate MR, no valvular vegetation. Required addition of mexitiline to suppress PVCs. Hospitalization c/b AKI and hypoxic/hypercarbic respiratory failure.   Discharged from [facility] 03/11/21 after he completed IV antibiotics and rehab.    At last visit midodrine stopped and GDMT started.    Sleep study 4/23 AHI 2   Here for routine f/u. Here for f/u with his wife. Says he is doing pretty well. 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 Walking 1 mile at the [place] almost everyday. And riding stationary bike for 30 mins every night."  PAIN:  Are you having pain? No  PRECAUTIONS: None  RED FLAGS: None   WEIGHT BEARING RESTRICTIONS: No  FALLS: Has  patient fallen in last 6 months? No  LIVING ENVIRONMENT: Lives with: lives with their spouse Lives in: House/apartment Stairs: Yes: Internal: 12 steps; on right going up and on left going up and External: 1 steps; on right going up and on left going up Has following equipment at home: Single point cane and does not use often    PLOF: Independent, Independent with basic ADLs, Independent with gait, and Independent with transfers  PATIENT GOALS: improve balance and strength in legs.   OBJECTIVE:  Note: Objective measures were completed at Evaluation unless otherwise noted.  DIAGNOSTIC FINDINGS:  None relevant   COGNITION: Overall cognitive status: Within functional limits for tasks assessed   SENSATION: Light touch: Impaired  and intermittent n/t in feet up to shins    COORDINATION: WFL   EDEMA:  Mild edema in bil LE   MUSCLE TONE: WFL   POSTURE: rounded shoulders and forward head  LOWER EXTREMITY ROM:     Grossly WFL  LOWER EXTREMITY MMT:    MMT Right Eval Left Eval  Hip flexion 4 4  Hip extension    Hip abduction 4+ 4+  Hip adduction 4 4  Hip internal rotation    Hip external rotation    Knee flexion 4+ 4+  Knee extension 4+ 4+  Ankle dorsiflexion 4+ 4+  Ankle plantarflexion    Ankle inversion    Ankle eversion    (Blank rows = not tested)  BED MOBILITY:  Sit to supine Complete Independence Supine to sit Complete Independence  TRANSFERS: Assistive device utilized: None  Sit to stand: Complete Independence Stand to sit: Complete Independence Chair to chair: Complete Independence Floor:  unable to complete .  Requires UE support topush from thighs to stand   RAMP:  Level of Assistance: Complete Independence Assistive device utilized:  Ramp Comments:   CURB:  Level of Assistance: Modified independence Assistive device utilized:  rail Curb Comments:   STAIRS: Level of Assistance: SBA Stair Negotiation Technique: Step to  Pattern Alternating Pattern  with Single Rail on Right Number of Stairs: 4  Height of  Stairs: 6  Comments: step to on descent  GAIT: Gait pattern: lateral hip instability, trunk flexed, and wide BOS Distance walked: 915 Assistive device utilized: None Level of assistance: Complete Independence Comments: increased flexed posture with increased distance  FUNCTIONAL TESTS:  5 times sit to stand: 19.11 with UE support; 21.44 with UE support on knees.  Timed up and go (TUG): 15.64, 13.74 sec  6 minute walk test: 965ft  10 meter walk test: 12.43 and 11.68 Berg Balance Scale: TBD Dynamic Gait Index: TBD  PATIENT SURVEYS:  ABC scale 60                                                                                                                              TREATMENT DATE: DATE: 07/12/23   Sitting 121/60 HR 58  Standing 0 min 105/57 HR 61  Standing 2 min 106/56 HR 64 no s/s  Sitting 109/55 HR 59 no s/s   Unless otherwise stated, supervision/CGA was provided and gait belt donned in order to ensure pt safety throughout session.   BP assessment as listed above.  Due to drop on BP session focused on BLE strengthening.  LAQ 4# AW 2 x 12  Hip flexion 4 # AW 2 x 12  Hip abduction BTB 2 x 12  Isometric hip Hip adduction x 12 bil  Sit<>stand 2x 8 no UE support  Standing ball toss off ball with trunk rotation to then perform contralateral tap to target x 8 bil  Weighted gait training with 4# AW to weave through exercise equipment through gym 3 x 186ft.   Throughout session cues from PT for full ROM proper posture, eccentric control, and pursed limp breathing.   Pt tolerated treatment well but required rest breaks throughout session  Seated BP taken at end of session 121/67 HR 63.     PATIENT EDUCATION: Education details: Purpose of functional outcome testing and education in HEP Person educated: Patient Education method: Explanation Education comprehension: verbalized  understanding  HOME EXERCISE PROGRAM:  Access Code: URL: [email or web address] Date: 06/23/2023 Prepared by: Casimiro Needle  Exercises - Doorway Pec Stretch at 60 Elevation  - 1 x daily - 7 x weekly - 2 sets - 1 minute hold - Standing Shoulder Row with Anchored Resistance  - 1 x daily - 7 x weekly - 3 sets - 10 reps - Low Trap Setting at Wall  - 1 x daily - 7 x weekly - 2 sets - 10 reps - Standing Tandem Balance with Counter Support  - 3 x weekly - 2 sets - 30 sec hold - Standing Near Stance in Corner with Eyes Closed  - 1 x daily - 7 x weekly - 2 sets - 30 seconds hold - Standing Single Leg Stance with Counter Support  - 3 x weekly - 3-5 sets - as long as possible hold - Standing March with Counter Support  - 3 x weekly - 3 sets -  10 reps - Side Stepping with Counter Support  - 1 x daily - 3 x weekly - 3 sets - 3 reps - Backward Walking with Counter Support  - 1 x daily - 7 x weekly - 3 sets - 5 reps   GOALS: Goals reviewed with patient? Yes  SHORT TERM GOALS: Target date: 06/09/2023   Patient will be independent in home exercise program to improve strength/mobility for better functional independence with ADLs. Baseline: 06/07/23: HEP provided  06/16/2023: pt reports consistent compliance with HEP 06/23/2023: updated HEP provided Goal status: IN PROGRESS   LONG TERM GOALS: Target date: 08/04/2023  Patient will increase ABC score to equal to or greater than 72  to demonstrate statistically significant improvement in mobility and quality of life.  Baseline: 60 06/16/2023: 55% Goal status: IN PROGRESS  2.  Patient (> 78 years old) will complete five times sit to stand test in < 15 seconds indicating an increased LE strength and improved balance. Baseline: 21.44 06/16/2023: 18.38 seconds no UE support Goal status: IN PROGRESS  3.  Patient will increase Berg Balance score by > 6 points to demonstrate decreased fall risk during functional activities Baseline: 1/30=  38/56 06/16/2023: 40/56 Goal status: IN PROGRESS  4.  Patient will increase 10 meter walk test to >1.98m/s as to improve gait speed for better community ambulation and to reduce fall risk. Baseline: 0.83 m/s 06/16/2023: 0.83 m/s (12 seconds) without AD Goal status: IN PROGRESS  5.  Patient will reduce timed up and go to <11 seconds to reduce fall risk and demonstrate improved transfer/gait ability. Baseline: 14 sec  06/16/2023: 12.57 seconds no AD Goal status: IN PROGRESS  6.  Patient will increase dynamic gait index score to >19/24 as to demonstrate reduced fall risk and improved dynamic gait balance for better safety with community/home ambulation.  Baseline: 05/12/23: 17 06/16/2023: 17/24 Goal status: IN PROGRESS    ASSESSMENT:  CLINICAL IMPRESSION:   Session focused on BLE strengthening in sitting initially due to mild orthostatic hypotension. Then increased to including weighted gait and trunkal rotation; no report of orthostatic s/s. Pt required multiple rest breaks, but tolerated intervention well. States that he liked the "work out". Pt will continue to benefit from skilled physical therapy intervention to address impairments, improve QOL, and attain therapy goals.  OBJECTIVE IMPAIRMENTS: Abnormal gait, cardiopulmonary status limiting activity, decreased activity tolerance, decreased balance, decreased coordination, decreased endurance, decreased knowledge of use of DME, decreased mobility, difficulty walking, decreased ROM, decreased strength, hypomobility, impaired perceived functional ability, impaired flexibility, impaired sensation, improper body mechanics, postural dysfunction, and obesity.   ACTIVITY LIMITATIONS: carrying, lifting, bending, standing, squatting, stairs, transfers, and locomotion level  PARTICIPATION LIMITATIONS: cleaning, laundry, community activity, and occupation  PERSONAL FACTORS: Age and 1-2 comorbidities: obesity CHF  are also affecting patient's functional  outcome.   REHAB POTENTIAL: Good  CLINICAL DECISION MAKING: Stable/uncomplicated  EVALUATION COMPLEXITY: Moderate  PLAN:  PT FREQUENCY: 1-2x/week  PT DURATION: 12 weeks  PLANNED INTERVENTIONS: 97110-Therapeutic exercises, 97530- Therapeutic activity, [id number]- Neuromuscular re-education, 97535- Self Care, [id number]- Manual therapy, [phone]- Gait training, Patient/Family education, Balance training, Stair training, Taping, DME instructions, Cryotherapy, and Moist heat  PLAN FOR NEXT SESSION:  - Dyanmic balance interventions  - BLE strengthening.   Grier Rocher PT, DPT  Physical Therapist - [place]  [facility]  9:59 AM 07/13/23

## 2023-07-14 ENCOUNTER — Ambulatory Visit: Payer: PPO | Admitting: Physical Therapy

## 2023-07-14 DIAGNOSIS — R2681 Unsteadiness on feet: Secondary | ICD-10-CM | POA: Diagnosis not present

## 2023-07-14 DIAGNOSIS — R2689 Other abnormalities of gait and mobility: Secondary | ICD-10-CM

## 2023-07-14 NOTE — Therapy (Signed)
 OUTPATIENT PHYSICAL THERAPY NEURO TREATMENT    Patient Name: Anthony Weber MRN: [id number] DOB:17-Dec-1945, 78 y.o., male Today's Date: 07/14/2023   PCP: Jaclyn Shaggy, MD  REFERRING PROVIDER: Dolores Patty, MD   END OF SESSION:   PT End of Session - 07/14/23 1141     Visit Number 18    Number of Visits 24    Date for PT Re-Evaluation 08/02/23    Progress Note Due on Visit 10    PT Start Time 1145    PT Stop Time 1233    PT Time Calculation (min) 48 min    Equipment Utilized During Treatment Gait belt    Activity Tolerance Patient tolerated treatment well    Behavior During Therapy WFL for tasks assessed/performed              Past Medical History:  Diagnosis Date   Depression    Hypertension    Hypothyroidism    Past Surgical History:  Procedure Laterality Date   COLONOSCOPY WITH PROPOFOL N/A 03/03/2015   Procedure: COLONOSCOPY WITH PROPOFOL;  Surgeon: Scot Jun, MD;  Location: [facility] ENDOSCOPY;  Service: Endoscopy;  Laterality: N/A;   EYE SURGERY     RIGHT/LEFT HEART CATH AND CORONARY ANGIOGRAPHY N/A 01/27/2021   Procedure: RIGHT/LEFT HEART CATH AND CORONARY ANGIOGRAPHY;  Surgeon: Yvonne Kendall, MD;  Location: [facility] INVASIVE CV LAB;  Service: Cardiovascular;  Laterality: N/A;   TEE WITHOUT CARDIOVERSION N/A 02/05/2021   Procedure: TRANSESOPHAGEAL ECHOCARDIOGRAM (TEE);  Surgeon: Dolores Patty, MD;  Location: [facility] ENDOSCOPY;  Service: Cardiovascular;  Laterality: N/A;   Patient Active Problem List   Diagnosis Date Noted   Hyponatremia    Acute on chronic combined systolic and diastolic congestive heart failure (HCC)    Acute on chronic respiratory failure with hypoxia and hypercapnia (HCC)    MSSA bacteremia 02/03/2021   Central line infection    Pneumonia of both lower lobes due to methicillin susceptible Staphylococcus aureus (MSSA) (HCC)    Cardiogenic shock (HCC) 01/29/2021   Primary hypertension    Acute HFrEF (heart failure with  reduced ejection fraction) (HCC)    Acute CHF (congestive heart failure) (HCC) 01/18/2021    ONSET DATE: 3 years.   REFERRING DIAG:  Diagnosis  R26.89 (ICD-10-CM) - Imbalance    THERAPY DIAG:  Unsteadiness on feet  Balance disorder  Imbalance  Rationale for Evaluation and Treatment: Rehabilitation  SUBJECTIVE:  SUBJECTIVE STATEMENT:    Pt reports he is never 100%, but states he is at his typical ~70%.  Pt accompanied by: self  PERTINENT HISTORY:   From recent MD appointment. "Admitted to [facility] 01/2021  acute HFrEF. Echo  EF 25-30% from previous EF 60-65%. R/LHC w/ no CAD, moderately elevated left heart and pulmonary artery pressures, severely elevated right heart filling pressures, severely reduced Fick cardiac output/index.  Required intropes, pressor, and lasix drip.  Transferred to [facility] for cardiogenic/septic shock, + MSSA bacteremia. Underwent TEE showing EF 25-30%, RV moderately down, severe biatrial enlargement and moderate MR, no valvular vegetation. Required addition of mexitiline to suppress PVCs. Hospitalization c/b AKI and hypoxic/hypercarbic respiratory failure.   Discharged from [facility] 03/11/21 after he completed IV antibiotics and rehab.    At last visit midodrine stopped and GDMT started.    Sleep study 4/23 AHI 2   Here for routine f/u. Here for f/u with his wife. Says he is doing pretty well. 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 Walking 1 mile at the [place] almost everyday. And riding stationary bike for 30 mins every night."  PAIN:  Are you having pain? No  PRECAUTIONS: None  RED FLAGS: None   WEIGHT BEARING RESTRICTIONS: No  FALLS: Has patient fallen in last 6 months? No  LIVING ENVIRONMENT: Lives with: lives with their spouse Lives in: House/apartment Stairs:  Yes: Internal: 12 steps; on right going up and on left going up and External: 1 steps; on right going up and on left going up Has following equipment at home: Single point cane and does not use often    PLOF: Independent, Independent with basic ADLs, Independent with gait, and Independent with transfers  PATIENT GOALS: improve balance and strength in legs.   OBJECTIVE:  Note: Objective measures were completed at Evaluation unless otherwise noted.  DIAGNOSTIC FINDINGS:  None relevant   COGNITION: Overall cognitive status: Within functional limits for tasks assessed   SENSATION: Light touch: Impaired  and intermittent n/t in feet up to shins    COORDINATION: WFL   EDEMA:  Mild edema in bil LE   MUSCLE TONE: WFL   POSTURE: rounded shoulders and forward head  LOWER EXTREMITY ROM:     Grossly WFL  LOWER EXTREMITY MMT:    MMT Right Eval Left Eval  Hip flexion 4 4  Hip extension    Hip abduction 4+ 4+  Hip adduction 4 4  Hip internal rotation    Hip external rotation    Knee flexion 4+ 4+  Knee extension 4+ 4+  Ankle dorsiflexion 4+ 4+  Ankle plantarflexion    Ankle inversion    Ankle eversion    (Blank rows = not tested)  BED MOBILITY:  Sit to supine Complete Independence Supine to sit Complete Independence  TRANSFERS: Assistive device utilized: None  Sit to stand: Complete Independence Stand to sit: Complete Independence Chair to chair: Complete Independence Floor:  unable to complete .  Requires UE support topush from thighs to stand   RAMP:  Level of Assistance: Complete Independence Assistive device utilized:  Ramp Comments:   CURB:  Level of Assistance: Modified independence Assistive device utilized:  rail Curb Comments:   STAIRS: Level of Assistance: SBA Stair Negotiation Technique: Step to Pattern Alternating Pattern  with Single Rail on Right Number of Stairs: 4  Height of Stairs: 6  Comments: step to on descent  GAIT: Gait  pattern: lateral hip instability, trunk flexed, and wide BOS Distance walked:  915 Assistive device utilized: None Level of assistance: Complete Independence Comments: increased flexed posture with increased distance  FUNCTIONAL TESTS:  5 times sit to stand: 19.11 with UE support; 21.44 with UE support on knees.  Timed up and go (TUG): 15.64, 13.74 sec  6 minute walk test: 971ft  10 meter walk test: 12.43 and 11.68 Berg Balance Scale: TBD Dynamic Gait Index: TBD  PATIENT SURVEYS:  ABC scale 60                                                                                                                              TREATMENT DATE: DATE: 07/14/23   Vitals to start session:  Sitting: BP 115/60 (MAP 77), HR 53bpm  Standing: BP 74/43 (MAP 55), HR 57bpm, pt reports feeling "a little cloudy" but states that is "all the time"  Returned to sitting: BP 103/62 (MAP 75), HR 57bpm - encouraged pt to drink 2 small cups of water Standing: BP 90/69 (MAP 77), HR 59bpm, but pt with no additional complaints of symptoms and eager to continue participating in session  Unless otherwise stated, supervision/CGA was provided and gait belt donned in order to ensure pt safety throughout session.   Gait training ~36ft with focus on fast forward walking and added head rotations to identify number of fingers held up by therapist - CGA for safety but no overt LOB.  Dynamic stepping balance training including:  fwd/back stepping over 1/2 foam roll x8 reps per leading LE with pt having significant improvement with this compared to last time performed with this therapist Progressed to adding dual-task challenge of 4 blaze pods (2 in front on ground to tap with his foot and 2 behind pt to tap with his hands) promoting trunk rotation 76min30sec - 19 hits Pt reports this as more challenging and feels he could be more successful with practice  Sit<>stands x12 reps for B LE strengthening as therapeutic rest  break  Dynamic balance standing with 1 foot on brown step with airex on top, during blaze pod taps on table for 45min30sec each LE (Blaze Pods set on random with 2 colors; pink=R and blue = L) - significant improvement in this with only requiring CGA for balance today!!  Dynamic gait training with blaze pods set in 1/2 circle using [employer] setting for the goal of visual scanning and requiring frequent turning - requires CGA with frequent min A for balance when pt turning quickly due to minor anterior lean/LOB - 62min30sec.  Tandem stance x30sec each LE with R foot forward being much more challenging, was able to attempt to progress to eyes closed with L foot forward for 10sec. Would benefit from continuation of this intervention as well    PATIENT EDUCATION: Education details: Purpose of functional outcome testing and education in HEP Person educated: Patient Education method: Explanation Education comprehension: verbalized understanding  HOME EXERCISE PROGRAM:  Access Code: URL: [email or web address] Date: 06/23/2023 Prepared by: Wenda Low  Nikko Goldwire  Exercises - Doorway Pec Stretch at 60 Elevation  - 1 x daily - 7 x weekly - 2 sets - 1 minute hold - Standing Shoulder Row with Anchored Resistance  - 1 x daily - 7 x weekly - 3 sets - 10 reps - Low Trap Setting at Wall  - 1 x daily - 7 x weekly - 2 sets - 10 reps - Standing Tandem Balance with Counter Support  - 3 x weekly - 2 sets - 30 sec hold - Standing Near Stance in Corner with Eyes Closed  - 1 x daily - 7 x weekly - 2 sets - 30 seconds hold - Standing Single Leg Stance with Counter Support  - 3 x weekly - 3-5 sets - as long as possible hold - Standing March with Counter Support  - 3 x weekly - 3 sets - 10 reps - Side Stepping with Counter Support  - 1 x daily - 3 x weekly - 3 sets - 3 reps - Backward Walking with Counter Support  - 1 x daily - 7 x weekly - 3 sets - 5 reps   GOALS: Goals reviewed with patient?  Yes  SHORT TERM GOALS: Target date: 06/09/2023   Patient will be independent in home exercise program to improve strength/mobility for better functional independence with ADLs. Baseline: 06/07/23: HEP provided  06/16/2023: pt reports consistent compliance with HEP 06/23/2023: updated HEP provided Goal status: IN PROGRESS   LONG TERM GOALS: Target date: 08/04/2023  Patient will increase ABC score to equal to or greater than 72  to demonstrate statistically significant improvement in mobility and quality of life.  Baseline: 60 06/16/2023: 55% Goal status: IN PROGRESS  2.  Patient (> 64 years old) will complete five times sit to stand test in < 15 seconds indicating an increased LE strength and improved balance. Baseline: 21.44 06/16/2023: 18.38 seconds no UE support Goal status: IN PROGRESS  3.  Patient will increase Berg Balance score by > 6 points to demonstrate decreased fall risk during functional activities Baseline: 1/30= 38/56 06/16/2023: 40/56 Goal status: IN PROGRESS  4.  Patient will increase 10 meter walk test to >1.23m/s as to improve gait speed for better community ambulation and to reduce fall risk. Baseline: 0.83 m/s 06/16/2023: 0.83 m/s (12 seconds) without AD Goal status: IN PROGRESS  5.  Patient will reduce timed up and go to <11 seconds to reduce fall risk and demonstrate improved transfer/gait ability. Baseline: 14 sec  06/16/2023: 12.57 seconds no AD Goal status: IN PROGRESS  6.  Patient will increase dynamic gait index score to >19/24 as to demonstrate reduced fall risk and improved dynamic gait balance for better safety with community/home ambulation.  Baseline: 05/12/23: 17 06/16/2023: 17/24 Goal status: IN PROGRESS    ASSESSMENT:  CLINICAL IMPRESSION:   Patient is a 77y.o. male who was seen today for physical therapy treatment for impaired balance, B LE weakness, impaired endurance, postural abnormalities, and gait deficits. Therapy session focusing on dynamic gait  training with dual-task challenges as well as balance interventions targeting increased time on single-limb-stance and variable direction stepping over obstacles. Pt demonstrates improving balance during forward/backward stepping over obstacle with ability to progress it to a dual-task. Pt continues to demonstrate significant deficits with tandem stance, especially with R foot forward. Pt also continues to have significant challenges with eyes closed balance challenges. Pt will continue to benefit from skilled physical therapy intervention to address impairments, improve QOL, decrease fall risk, and attain  therapy goals.   OBJECTIVE IMPAIRMENTS: Abnormal gait, cardiopulmonary status limiting activity, decreased activity tolerance, decreased balance, decreased coordination, decreased endurance, decreased knowledge of use of DME, decreased mobility, difficulty walking, decreased ROM, decreased strength, hypomobility, impaired perceived functional ability, impaired flexibility, impaired sensation, improper body mechanics, postural dysfunction, and obesity.   ACTIVITY LIMITATIONS: carrying, lifting, bending, standing, squatting, stairs, transfers, and locomotion level  PARTICIPATION LIMITATIONS: cleaning, laundry, community activity, and occupation  PERSONAL FACTORS: Age and 1-2 comorbidities: obesity CHF  are also affecting patient's functional outcome.   REHAB POTENTIAL: Good  CLINICAL DECISION MAKING: Stable/uncomplicated  EVALUATION COMPLEXITY: Moderate  PLAN:  PT FREQUENCY: 1-2x/week  PT DURATION: 12 weeks  PLANNED INTERVENTIONS: 97110-Therapeutic exercises, 97530- Therapeutic activity, 97112- Neuromuscular re-education, 97535- Self Care, 16109- Manual therapy, [phone]- Gait training, Patient/Family education, Balance training, Stair training, Taping, DME instructions, Cryotherapy, and Moist heat  PLAN FOR NEXT SESSION:  Dynamic balance interventions Fwd/back stepping over 1/2 foam roll with  blaze pod dual-task progressed Static balance Tandem stance --> progress to eyes closed Dynamic gait training including: quick turning  stepping over obstacles agility ladder BLE strengthening   Darrio Bade, PT, DPT, NCS, CSRS Physical Therapist - [place]  [facility]  5:47 PM 07/14/23

## 2023-07-17 ENCOUNTER — Other Ambulatory Visit (HOSPITAL_COMMUNITY): Payer: Self-pay

## 2023-07-19 ENCOUNTER — Other Ambulatory Visit: Payer: Self-pay

## 2023-07-19 ENCOUNTER — Ambulatory Visit: Payer: PPO

## 2023-07-19 ENCOUNTER — Ambulatory Visit
Admission: RE | Admit: 2023-07-19 | Discharge: 2023-07-19 | Disposition: A | Discharge status: Home / Self Care | Payer: PPO | Source: Ambulatory Visit | Attending: Internal Medicine | Admitting: Internal Medicine

## 2023-07-19 ENCOUNTER — Ambulatory Visit: Payer: PPO | Admitting: Physical Therapy

## 2023-07-19 DIAGNOSIS — I5022 Chronic systolic (congestive) heart failure: Secondary | ICD-10-CM

## 2023-07-19 DIAGNOSIS — I110 Hypertensive heart disease with heart failure: Secondary | ICD-10-CM | POA: Diagnosis not present

## 2023-07-19 DIAGNOSIS — I340 Nonrheumatic mitral (valve) insufficiency: Secondary | ICD-10-CM | POA: Diagnosis not present

## 2023-07-19 DIAGNOSIS — I5042 Chronic combined systolic (congestive) and diastolic (congestive) heart failure: Secondary | ICD-10-CM | POA: Diagnosis not present

## 2023-07-19 LAB — ECHOCARDIOGRAM COMPLETE
AR max vel: 3.09 cm2
AV Area VTI: 3.39 cm2
AV Area mean vel: 3.21 cm2
AV Mean grad: 2 mmHg
AV Peak grad: 3.9 mmHg
Ao pk vel: 0.98 m/s
Area-P 1/2: 2.55 cm2
Calc EF: 14.3 %
MV VTI: 2.19 cm2
S' Lateral: 5.5 cm
Single Plane A2C EF: 23.7 %
Single Plane A4C EF: 16.8 %

## 2023-07-19 MED ORDER — PERFLUTREN LIPID MICROSPHERE
1.0000 mL | INTRAVENOUS | Status: AC | PRN
  Administered 2023-07-19: 2 mL via INTRAVENOUS

## 2023-07-19 NOTE — Progress Notes (Signed)
*  PRELIMINARY RESULTS* Echocardiogram 2D Echocardiogram has been performed.  Cristela Blue 07/19/2023, 11:02 AM

## 2023-07-21 ENCOUNTER — Ambulatory Visit: Payer: PPO | Admitting: Physical Therapy

## 2023-07-21 DIAGNOSIS — R2681 Unsteadiness on feet: Secondary | ICD-10-CM

## 2023-07-21 DIAGNOSIS — R2689 Other abnormalities of gait and mobility: Secondary | ICD-10-CM

## 2023-07-21 NOTE — Therapy (Signed)
 OUTPATIENT PHYSICAL THERAPY NEURO TREATMENT    Patient Name: Anthony Weber MRN: [id number] DOB:28-Sep-1945, 78 y.o., male Today's Date: 07/21/2023   PCP: Jaclyn Shaggy, MD  REFERRING PROVIDER: Dolores Patty, MD   END OF SESSION: ***  PT End of Session - 07/21/23 1021     Visit Number 19    Number of Visits 24    Date for PT Re-Evaluation 08/02/23    Progress Note Due on Visit 10    PT Start Time 1020    PT Stop Time 1100    PT Time Calculation (min) 40 min    Equipment Utilized During Treatment Gait belt    Activity Tolerance Patient tolerated treatment well    Behavior During Therapy WFL for tasks assessed/performed               Past Medical History:  Diagnosis Date   Depression    Hypertension    Hypothyroidism    Past Surgical History:  Procedure Laterality Date   COLONOSCOPY WITH PROPOFOL N/A 03/03/2015   Procedure: COLONOSCOPY WITH PROPOFOL;  Surgeon: Scot Jun, MD;  Location: [facility] ENDOSCOPY;  Service: Endoscopy;  Laterality: N/A;   EYE SURGERY     RIGHT/LEFT HEART CATH AND CORONARY ANGIOGRAPHY N/A 01/27/2021   Procedure: RIGHT/LEFT HEART CATH AND CORONARY ANGIOGRAPHY;  Surgeon: Yvonne Kendall, MD;  Location: [facility] INVASIVE CV LAB;  Service: Cardiovascular;  Laterality: N/A;   TEE WITHOUT CARDIOVERSION N/A 02/05/2021   Procedure: TRANSESOPHAGEAL ECHOCARDIOGRAM (TEE);  Surgeon: Dolores Patty, MD;  Location: [facility] ENDOSCOPY;  Service: Cardiovascular;  Laterality: N/A;   Patient Active Problem List   Diagnosis Date Noted   Hyponatremia    Acute on chronic combined systolic and diastolic congestive heart failure (HCC)    Acute on chronic respiratory failure with hypoxia and hypercapnia (HCC)    MSSA bacteremia 02/03/2021   Central line infection    Pneumonia of both lower lobes due to methicillin susceptible Staphylococcus aureus (MSSA) (HCC)    Cardiogenic shock (HCC) 01/29/2021   Primary hypertension    Acute HFrEF (heart  failure with reduced ejection fraction) (HCC)    Acute CHF (congestive heart failure) (HCC) 01/18/2021    ONSET DATE: 3 years.   REFERRING DIAG:  Diagnosis  R26.89 (ICD-10-CM) - Imbalance    THERAPY DIAG:  Unsteadiness on feet  Balance disorder  Imbalance  Rationale for Evaluation and Treatment: Rehabilitation  SUBJECTIVE:  SUBJECTIVE STATEMENT:    Pt continues to reports he is at his typical 70%. Pt reports he had his echocardiogram, but hasn't received the results yet. Has appointment with cardiologist on April 21st. ***  Pt accompanied by: self  PERTINENT HISTORY:   From recent MD appointment. "Admitted to [facility] 01/2021  acute HFrEF. Echo  EF 25-30% from previous EF 60-65%. R/LHC w/ no CAD, moderately elevated left heart and pulmonary artery pressures, severely elevated right heart filling pressures, severely reduced Fick cardiac output/index.  Required intropes, pressor, and lasix drip.  Transferred to [facility] for cardiogenic/septic shock, + MSSA bacteremia. Underwent TEE showing EF 25-30%, RV moderately down, severe biatrial enlargement and moderate MR, no valvular vegetation. Required addition of mexitiline to suppress PVCs. Hospitalization c/b AKI and hypoxic/hypercarbic respiratory failure.   Discharged from [facility] 03/11/21 after he completed IV antibiotics and rehab.    At last visit midodrine stopped and GDMT started.    Sleep study 4/23 AHI 2   Here for routine f/u. Here for f/u with his wife. Says he is doing pretty well. 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 Walking 1 mile at the [place] almost everyday. And riding stationary bike for 30 mins every night."  PAIN:  Are you having pain? No  PRECAUTIONS: None  RED FLAGS: None   WEIGHT BEARING RESTRICTIONS: No  FALLS: Has patient  fallen in last 6 months? No  LIVING ENVIRONMENT: Lives with: lives with their spouse Lives in: House/apartment Stairs: Yes: Internal: 12 steps; on right going up and on left going up and External: 1 steps; on right going up and on left going up Has following equipment at home: Single point cane and does not use often    PLOF: Independent, Independent with basic ADLs, Independent with gait, and Independent with transfers  PATIENT GOALS: improve balance and strength in legs.   OBJECTIVE:  Note: Objective measures were completed at Evaluation unless otherwise noted.  DIAGNOSTIC FINDINGS:  None relevant   COGNITION: Overall cognitive status: Within functional limits for tasks assessed   SENSATION: Light touch: Impaired  and intermittent n/t in feet up to shins    COORDINATION: WFL   EDEMA:  Mild edema in bil LE   MUSCLE TONE: WFL   POSTURE: rounded shoulders and forward head  LOWER EXTREMITY ROM:     Grossly WFL  LOWER EXTREMITY MMT:    MMT Right Eval Left Eval  Hip flexion 4 4  Hip extension    Hip abduction 4+ 4+  Hip adduction 4 4  Hip internal rotation    Hip external rotation    Knee flexion 4+ 4+  Knee extension 4+ 4+  Ankle dorsiflexion 4+ 4+  Ankle plantarflexion    Ankle inversion    Ankle eversion    (Blank rows = not tested)  BED MOBILITY:  Sit to supine Complete Independence Supine to sit Complete Independence  TRANSFERS: Assistive device utilized: None  Sit to stand: Complete Independence Stand to sit: Complete Independence Chair to chair: Complete Independence Floor:  unable to complete .  Requires UE support topush from thighs to stand   RAMP:  Level of Assistance: Complete Independence Assistive device utilized:  Ramp Comments:   CURB:  Level of Assistance: Modified independence Assistive device utilized:  rail Curb Comments:   STAIRS: Level of Assistance: SBA Stair Negotiation Technique: Step to Pattern Alternating  Pattern  with Single Rail on Right Number of Stairs: 4  Height of Stairs: 6  Comments: step to  on descent  GAIT: Gait pattern: lateral hip instability, trunk flexed, and wide BOS Distance walked: 915 Assistive device utilized: None Level of assistance: Complete Independence Comments: increased flexed posture with increased distance  FUNCTIONAL TESTS:  5 times sit to stand: 19.11 with UE support; 21.44 with UE support on knees.  Timed up and go (TUG): 15.64, 13.74 sec  6 minute walk test: 942ft  10 meter walk test: 12.43 and 11.68 Berg Balance Scale: TBD Dynamic Gait Index: TBD  PATIENT SURVEYS:  ABC scale 60                                                                                                                              TREATMENT DATE: DATE: 07/21/23  ***  Vitals to start session:  Sitting: BP 116/76 (MAP 89), HR 56bpm  Standing: BP 114/62 (MAP 79), HR 59bpm   Unless otherwise stated, supervision/CGA was provided and gait belt donned in order to ensure pt safety throughout session.   ***  Gait training for ~55min totaling >643ft with focus on fast forward walking and added head rotations to identify number of fingers held up by therapist, backwards walking ~26ft x2 - CGA for safety but no overt LOB, except during backwards walking causing L anterior LOB with light min A for safety ***  Pt requiring restroom break during session.   Dynamic stepping balance training including:  fwd/back stepping over hurdle x8 reps per leading LE with pt having significant challenge with this increased height - requires min A for balance and cuing with visual demonstration how to improve wt shifting to allow him to step-over with contralateral LE Progressed to adding dual-task challenge of 4 blaze pods (2 in front on ground to tap with his foot to encourage increased single leg stance timbe - noticed when stepping back with R LE pt has to rotate entire hips causing his R LE to  excessive adduct/scissor behind  Pt reports this as more challenging and feels he could be more successful with practice  Performed standing R hip flexor stretch, but noticed pt with adequate hip extension AROM so performed repeated R heel taps back to external target to promote increased hip abduction activation for wider BOS. ***  Sit<>stands 2 x12 reps for B LE strengthening with 5lb dumbbells in each hand ***    *** Tandem stance x30sec each LE with R foot forward being much more challenging, was able to attempt to progress to eyes closed with L foot forward for 10sec. Would benefit from continuation of this intervention as well   ***   PATIENT EDUCATION: Education details: Purpose of functional outcome testing and education in HEP Person educated: Patient Education method: Explanation Education comprehension: verbalized understanding  HOME EXERCISE PROGRAM:  Access Code: URL: [email or web address] Date: 06/23/2023 Prepared by: Casimiro Needle  Exercises - Doorway Pec Stretch at 60 Elevation  - 1 x daily - 7 x weekly - 2 sets - 1 minute  hold - Standing Shoulder Row with Anchored Resistance  - 1 x daily - 7 x weekly - 3 sets - 10 reps - Low Trap Setting at Wall  - 1 x daily - 7 x weekly - 2 sets - 10 reps - Standing Tandem Balance with Counter Support  - 3 x weekly - 2 sets - 30 sec hold - Standing Near Stance in Corner with Eyes Closed  - 1 x daily - 7 x weekly - 2 sets - 30 seconds hold - Standing Single Leg Stance with Counter Support  - 3 x weekly - 3-5 sets - as long as possible hold - Standing March with Counter Support  - 3 x weekly - 3 sets - 10 reps - Side Stepping with Counter Support  - 1 x daily - 3 x weekly - 3 sets - 3 reps - Backward Walking with Counter Support  - 1 x daily - 7 x weekly - 3 sets - 5 reps   GOALS: Goals reviewed with patient? Yes  SHORT TERM GOALS: Target date: 06/09/2023   Patient will be independent in home  exercise program to improve strength/mobility for better functional independence with ADLs. Baseline: 06/07/23: HEP provided  06/16/2023: pt reports consistent compliance with HEP 06/23/2023: updated HEP provided Goal status: IN PROGRESS   LONG TERM GOALS: Target date: 08/04/2023  Patient will increase ABC score to equal to or greater than 72  to demonstrate statistically significant improvement in mobility and quality of life.  Baseline: 60 06/16/2023: 55% Goal status: IN PROGRESS  2.  Patient (> 57 years old) will complete five times sit to stand test in < 15 seconds indicating an increased LE strength and improved balance. Baseline: 21.44 06/16/2023: 18.38 seconds no UE support Goal status: IN PROGRESS  3.  Patient will increase Berg Balance score by > 6 points to demonstrate decreased fall risk during functional activities Baseline: 1/30= 38/56 06/16/2023: 40/56 Goal status: IN PROGRESS  4.  Patient will increase 10 meter walk test to >1.35m/s as to improve gait speed for better community ambulation and to reduce fall risk. Baseline: 0.83 m/s 06/16/2023: 0.83 m/s (12 seconds) without AD Goal status: IN PROGRESS  5.  Patient will reduce timed up and go to <11 seconds to reduce fall risk and demonstrate improved transfer/gait ability. Baseline: 14 sec  06/16/2023: 12.57 seconds no AD Goal status: IN PROGRESS  6.  Patient will increase dynamic gait index score to >19/24 as to demonstrate reduced fall risk and improved dynamic gait balance for better safety with community/home ambulation.  Baseline: 05/12/23: 17 06/16/2023: 17/24 Goal status: IN PROGRESS    ASSESSMENT:  CLINICAL IMPRESSION:  *** Patient is a 77y.o. male who was seen today for physical therapy treatment for impaired balance, B LE weakness, impaired endurance, postural abnormalities, and gait deficits. Therapy session focusing on dynamic gait training with dual-task challenges as well as balance interventions targeting  increased time on single-limb-stance and variable direction stepping over obstacles. Pt demonstrates improving balance during forward/backward stepping over obstacle with ability to progress it to a dual-task. Pt continues to demonstrate significant deficits with tandem stance, especially with R foot forward. Pt also continues to have significant challenges with eyes closed balance challenges. Pt will continue to benefit from skilled physical therapy intervention to address impairments, improve QOL, decrease fall risk, and attain therapy goals.   OBJECTIVE IMPAIRMENTS: Abnormal gait, cardiopulmonary status limiting activity, decreased activity tolerance, decreased balance, decreased coordination, decreased endurance, decreased knowledge of use  of DME, decreased mobility, difficulty walking, decreased ROM, decreased strength, hypomobility, impaired perceived functional ability, impaired flexibility, impaired sensation, improper body mechanics, postural dysfunction, and obesity.   ACTIVITY LIMITATIONS: carrying, lifting, bending, standing, squatting, stairs, transfers, and locomotion level  PARTICIPATION LIMITATIONS: cleaning, laundry, community activity, and occupation  PERSONAL FACTORS: Age and 1-2 comorbidities: obesity CHF  are also affecting patient's functional outcome.   REHAB POTENTIAL: Good  CLINICAL DECISION MAKING: Stable/uncomplicated  EVALUATION COMPLEXITY: Moderate  PLAN:  PT FREQUENCY: 1-2x/week  PT DURATION: 12 weeks  PLANNED INTERVENTIONS: 97110-Therapeutic exercises, 97530- Therapeutic activity, 97112- Neuromuscular re-education, 97535- Self Care, [id number]- Manual therapy, [phone]- Gait training, Patient/Family education, Balance training, Stair training, Taping, DME instructions, Cryotherapy, and Moist heat  PLAN FOR NEXT SESSION:  ***Progress note* Dynamic balance interventions Fwd/back stepping over 1/2 foam roll with blaze pod dual-task progressed Static balance Tandem  stance --> progress to eyes closed Dynamic gait training including: quick turning  stepping over obstacles agility ladder BLE strengthening   Antanasia Kaczynski, PT, DPT, NCS, CSRS Physical Therapist - [facility]  [facility]  11:02 AM 07/21/23

## 2023-07-25 NOTE — Therapy (Signed)
 OUTPATIENT PHYSICAL THERAPY NEURO TREATMENT/ Physical Therapy Progress Note   Dates of reporting period  06/21/23   to   07/26/23    Patient Name: Anthony Weber MRN: [id number] DOB:22-Feb-1946, 78 y.o., male Today's Date: 07/26/2023   PCP: Westley Hammers, MD  REFERRING PROVIDER: Mardell Shade, MD   END OF SESSION:   PT End of Session - 07/26/23 1101     Visit Number 20    Number of Visits 24    Date for PT Re-Evaluation 08/02/23    Progress Note Due on Visit 10    PT Start Time 1101    PT Stop Time 1143    PT Time Calculation (min) 42 min    Equipment Utilized During Treatment Gait belt    Activity Tolerance Patient tolerated treatment well    Behavior During Therapy WFL for tasks assessed/performed                Past Medical History:  Diagnosis Date   Depression    Hypertension    Hypothyroidism    Past Surgical History:  Procedure Laterality Date   COLONOSCOPY WITH PROPOFOL N/A 03/03/2015   Procedure: COLONOSCOPY WITH PROPOFOL;  Surgeon: Cassie Click, MD;  Location: [facility] ENDOSCOPY;  Service: Endoscopy;  Laterality: N/A;   EYE SURGERY     RIGHT/LEFT HEART CATH AND CORONARY ANGIOGRAPHY N/A 01/27/2021   Procedure: RIGHT/LEFT HEART CATH AND CORONARY ANGIOGRAPHY;  Surgeon: Sammy Crisp, MD;  Location: [facility] INVASIVE CV LAB;  Service: Cardiovascular;  Laterality: N/A;   TEE WITHOUT CARDIOVERSION N/A 02/05/2021   Procedure: TRANSESOPHAGEAL ECHOCARDIOGRAM (TEE);  Surgeon: Mardell Shade, MD;  Location: [facility] ENDOSCOPY;  Service: Cardiovascular;  Laterality: N/A;   Patient Active Problem List   Diagnosis Date Noted   Hyponatremia    Acute on chronic combined systolic and diastolic congestive heart failure (HCC)    Acute on chronic respiratory failure with hypoxia and hypercapnia (HCC)    MSSA bacteremia 02/03/2021   Central line infection    Pneumonia of both lower lobes due to methicillin susceptible Staphylococcus aureus (MSSA) (HCC)     Cardiogenic shock (HCC) 01/29/2021   Primary hypertension    Acute HFrEF (heart failure with reduced ejection fraction) (HCC)    Acute CHF (congestive heart failure) (HCC) 01/18/2021    ONSET DATE: 3 years.   REFERRING DIAG:  Diagnosis  R26.89 (ICD-10-CM) - Imbalance    THERAPY DIAG:  Unsteadiness on feet  Balance disorder  Imbalance  Rationale for Evaluation and Treatment: Rehabilitation  SUBJECTIVE:  SUBJECTIVE STATEMENT:     Patient reports he feels he's improved a little bit since the start of therapy.   Pt accompanied by: self  PERTINENT HISTORY:   From recent MD appointment. "Admitted to [facility] 01/2021  acute HFrEF. Echo  EF 25-30% from previous EF 60-65%. R/LHC w/ no CAD, moderately elevated left heart and pulmonary artery pressures, severely elevated right heart filling pressures, severely reduced Fick cardiac output/index.  Required intropes, pressor, and lasix drip.  Transferred to [facility] for cardiogenic/septic shock, + MSSA bacteremia. Underwent TEE showing EF 25-30%, RV moderately down, severe biatrial enlargement and moderate MR, no valvular vegetation. Required addition of mexitiline to suppress PVCs. Hospitalization c/b AKI and hypoxic/hypercarbic respiratory failure.   Discharged from [facility] 03/11/21 after he completed IV antibiotics and rehab.    At last visit midodrine stopped and GDMT started.    Sleep study 4/23 AHI 2   Here for routine f/u. Here for f/u with his wife. Says he is doing pretty well. 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 Walking 1 mile at the [place] almost everyday. And riding stationary bike for 30 mins every night."  PAIN:  Are you having pain? No  PRECAUTIONS: None  RED FLAGS: None   WEIGHT BEARING RESTRICTIONS: No  FALLS: Has patient fallen in last 6  months? No  LIVING ENVIRONMENT: Lives with: lives with their spouse Lives in: House/apartment Stairs: Yes: Internal: 12 steps; on right going up and on left going up and External: 1 steps; on right going up and on left going up Has following equipment at home: Single point cane and does not use often    PLOF: Independent, Independent with basic ADLs, Independent with gait, and Independent with transfers  PATIENT GOALS: improve balance and strength in legs.   OBJECTIVE:  Note: Objective measures were completed at Evaluation unless otherwise noted.  DIAGNOSTIC FINDINGS:  None relevant   COGNITION: Overall cognitive status: Within functional limits for tasks assessed   SENSATION: Light touch: Impaired  and intermittent n/t in feet up to shins    COORDINATION: WFL   EDEMA:  Mild edema in bil LE   MUSCLE TONE: WFL   POSTURE: rounded shoulders and forward head  LOWER EXTREMITY ROM:     Grossly WFL  LOWER EXTREMITY MMT:    MMT Right Eval Left Eval  Hip flexion 4 4  Hip extension    Hip abduction 4+ 4+  Hip adduction 4 4  Hip internal rotation    Hip external rotation    Knee flexion 4+ 4+  Knee extension 4+ 4+  Ankle dorsiflexion 4+ 4+  Ankle plantarflexion    Ankle inversion    Ankle eversion    (Blank rows = not tested)  BED MOBILITY:  Sit to supine Complete Independence Supine to sit Complete Independence  TRANSFERS: Assistive device utilized: None  Sit to stand: Complete Independence Stand to sit: Complete Independence Chair to chair: Complete Independence Floor:  unable to complete .  Requires UE support topush from thighs to stand   RAMP:  Level of Assistance: Complete Independence Assistive device utilized:  Ramp Comments:   CURB:  Level of Assistance: Modified independence Assistive device utilized:  rail Curb Comments:   STAIRS: Level of Assistance: SBA Stair Negotiation Technique: Step to Pattern Alternating Pattern  with Single  Rail on Right Number of Stairs: 4  Height of Stairs: 6  Comments: step to on descent  GAIT: Gait pattern: lateral hip instability, trunk flexed, and wide BOS  Distance walked: 915 Assistive device utilized: None Level of assistance: Complete Independence Comments: increased flexed posture with increased distance  FUNCTIONAL TESTS:  5 times sit to stand: 19.11 with UE support; 21.44 with UE support on knees.  Timed up and go (TUG): 15.64, 13.74 sec  6 minute walk test: 963ft  10 meter walk test: 12.43 and 11.68 Berg Balance Scale: TBD Dynamic Gait Index: TBD  PATIENT SURVEYS:  ABC scale 60                                                                                                                              TREATMENT DATE: DATE: 07/26/23    Unless otherwise stated, supervision/CGA was provided and gait belt donned in order to ensure pt safety throughout session.   Physical therapy treatment session today consisted of completing assessment of goals and administration of testing as demonstrated and documented in flow sheet, treatment, and goals section of this note. Addition treatments may be found below.   ABC: 70.6% 5xSTS: 15.55 seconds BERG: 42/56 : 0.96 m/s TUG: 11.88 seconds DGI: 18/24  Sit to stand 2 x 10 with 9# DB at chest with both hands   Fwd/bwd step overs orange hurdle x 8 leading with each LE - single finger support on bar   Lateral step over orange hurdle x 8 each direction with intermittent UE support and minA for balance    PATIENT EDUCATION: Education details: Purpose of functional outcome testing and education in HEP Person educated: Patient Education method: Explanation Education comprehension: verbalized understanding  HOME EXERCISE PROGRAM:  Access Code: URL: [email or web address] Date: 06/23/2023 Prepared by: Carlen Chasten  Exercises - Doorway Pec Stretch at 60 Elevation  - 1 x daily - 7 x weekly - 2 sets - 1  minute hold - Standing Shoulder Row with Anchored Resistance  - 1 x daily - 7 x weekly - 3 sets - 10 reps - Low Trap Setting at Wall  - 1 x daily - 7 x weekly - 2 sets - 10 reps - Standing Tandem Balance with Counter Support  - 3 x weekly - 2 sets - 30 sec hold - Standing Near Stance in Corner with Eyes Closed  - 1 x daily - 7 x weekly - 2 sets - 30 seconds hold - Standing Single Leg Stance with Counter Support  - 3 x weekly - 3-5 sets - as long as possible hold - Standing March with Counter Support  - 3 x weekly - 3 sets - 10 reps - Side Stepping with Counter Support  - 1 x daily - 3 x weekly - 3 sets - 3 reps - Backward Walking with Counter Support  - 1 x daily - 7 x weekly - 3 sets - 5 reps   GOALS: Goals reviewed with patient? Yes  SHORT TERM GOALS: Target date: 06/09/2023   Patient will be independent in home exercise program to improve strength/mobility for better functional  independence with ADLs. Baseline: 06/07/23: HEP provided  06/16/2023: pt reports consistent compliance with HEP 06/23/2023: updated HEP provided Goal status: IN PROGRESS   LONG TERM GOALS: Target date: 08/04/2023  Patient will increase ABC score to equal to or greater than 72  to demonstrate statistically significant improvement in mobility and quality of life.  Baseline: 60 06/16/2023: 55% 07/26/23: 70.6%  Goal status: IN PROGRESS  2.  Patient (> 56 years old) will complete five times sit to stand test in < 15 seconds indicating an increased LE strength and improved balance. Baseline: 21.44 06/16/2023: 18.38 seconds no UE support 07/26/23: 15.55 seconds no UE support  Goal status: IN PROGRESS  3.  Patient will increase Berg Balance score by > 6 points to demonstrate decreased fall risk during functional activities Baseline: 1/30= 38/56 06/16/2023: 40/56 07/26/23: 42/56 Goal status: IN PROGRESS  4.  Patient will increase 10 meter walk test to >1.14m/s as to improve gait speed for better community ambulation and  to reduce fall risk. Baseline: 0.83 m/s 06/16/2023: 0.83 m/s (12 seconds) without AD 07/26/23: 0.96 m/s with no AD  Goal status: IN PROGRESS  5.  Patient will reduce timed up and go to <11 seconds to reduce fall risk and demonstrate improved transfer/gait ability. Baseline: 14 sec  06/16/2023: 12.57 seconds no AD 07/26/23: 11.88 seconds no AD  Goal status: IN PROGRESS  6.  Patient will increase dynamic gait index score to >19/24 as to demonstrate reduced fall risk and improved dynamic gait balance for better safety with community/home ambulation.  Baseline: 05/12/23: 17 06/16/2023: 17/24 07/26/23: 18/24 Goal status: IN PROGRESS    ASSESSMENT:  CLINICAL IMPRESSION:    Patient seen for progress note goal reassessment. Patient has improved in all goals demonstrating improved BLE strength and balance, however continues to be at high fall risk due to gait deviations. Limited progress anticipate with DGI score due to ongoing gait deviations. Patient's condition has the potential to improve in response to therapy. Maximum improvement is yet to be obtained. The anticipated improvement is attainable and reasonable in a generally predictable time. Pt will continue to benefit from skilled physical therapy intervention to address impairments, improve QOL, decrease fall risk, and attain therapy goals.    OBJECTIVE IMPAIRMENTS: Abnormal gait, cardiopulmonary status limiting activity, decreased activity tolerance, decreased balance, decreased coordination, decreased endurance, decreased knowledge of use of DME, decreased mobility, difficulty walking, decreased ROM, decreased strength, hypomobility, impaired perceived functional ability, impaired flexibility, impaired sensation, improper body mechanics, postural dysfunction, and obesity.   ACTIVITY LIMITATIONS: carrying, lifting, bending, standing, squatting, stairs, transfers, and locomotion level  PARTICIPATION LIMITATIONS: cleaning, laundry, community  activity, and occupation  PERSONAL FACTORS: Age and 1-2 comorbidities: obesity CHF  are also affecting patient's functional outcome.   REHAB POTENTIAL: Good  CLINICAL DECISION MAKING: Stable/uncomplicated  EVALUATION COMPLEXITY: Moderate  PLAN:  PT FREQUENCY: 1-2x/week  PT DURATION: 12 weeks  PLANNED INTERVENTIONS: 97110-Therapeutic exercises, 97530- Therapeutic activity, 97112- Neuromuscular re-education, 97535- Self Care, [id number]- Manual therapy, [phone]- Gait training, Patient/Family education, Balance training, Stair training, Taping, DME instructions, Cryotherapy, and Moist heat  PLAN FOR NEXT SESSION:  *Progress note* Dynamic balance interventions Fwd/back stepping over 1/2 foam roll vs hurdle with blaze pod dual-task  Static balance Tandem stance --> progress to eyes closed Dynamic gait training including: quick turning  stepping over obstacles agility ladder BLE strengthening   Maylon Peppers, PT, DPT  Physical Therapist - [facility]  [facility]  11:01 AM 07/26/23

## 2023-07-26 ENCOUNTER — Ambulatory Visit: Payer: PPO

## 2023-07-26 ENCOUNTER — Ambulatory Visit: Payer: PPO | Admitting: Physical Therapy

## 2023-07-26 DIAGNOSIS — R2681 Unsteadiness on feet: Secondary | ICD-10-CM

## 2023-07-26 DIAGNOSIS — R2689 Other abnormalities of gait and mobility: Secondary | ICD-10-CM

## 2023-07-28 ENCOUNTER — Ambulatory Visit: Payer: PPO | Admitting: Physical Therapy

## 2023-07-28 ENCOUNTER — Ambulatory Visit: Payer: PPO

## 2023-07-28 DIAGNOSIS — R2681 Unsteadiness on feet: Secondary | ICD-10-CM | POA: Diagnosis not present

## 2023-07-28 DIAGNOSIS — M6281 Muscle weakness (generalized): Secondary | ICD-10-CM

## 2023-07-28 NOTE — Therapy (Signed)
 OUTPATIENT PHYSICAL THERAPY NEURO TREATMENT   Patient Name: Anthony Weber MRN: [id number] DOB:1945/10/15, 78 y.o., male Today's Date: 07/28/2023   PCP: Westley Hammers, MD  REFERRING PROVIDER: Mardell Shade, MD   END OF SESSION:   PT End of Session - 07/28/23 1151     Visit Number 21    Number of Visits 24    Date for PT Re-Evaluation 08/02/23    Progress Note Due on Visit 10    PT Start Time 1106    PT Stop Time 1145    PT Time Calculation (min) 39 min    Equipment Utilized During Treatment Gait belt    Activity Tolerance Patient tolerated treatment well    Behavior During Therapy WFL for tasks assessed/performed                 Past Medical History:  Diagnosis Date   Depression    Hypertension    Hypothyroidism    Past Surgical History:  Procedure Laterality Date   COLONOSCOPY WITH PROPOFOL N/A 03/03/2015   Procedure: COLONOSCOPY WITH PROPOFOL;  Surgeon: Cassie Click, MD;  Location: [facility] ENDOSCOPY;  Service: Endoscopy;  Laterality: N/A;   EYE SURGERY     RIGHT/LEFT HEART CATH AND CORONARY ANGIOGRAPHY N/A 01/27/2021   Procedure: RIGHT/LEFT HEART CATH AND CORONARY ANGIOGRAPHY;  Surgeon: Sammy Crisp, MD;  Location: [facility] INVASIVE CV LAB;  Service: Cardiovascular;  Laterality: N/A;   TEE WITHOUT CARDIOVERSION N/A 02/05/2021   Procedure: TRANSESOPHAGEAL ECHOCARDIOGRAM (TEE);  Surgeon: Mardell Shade, MD;  Location: [facility] ENDOSCOPY;  Service: Cardiovascular;  Laterality: N/A;   Patient Active Problem List   Diagnosis Date Noted   Hyponatremia    Acute on chronic combined systolic and diastolic congestive heart failure (HCC)    Acute on chronic respiratory failure with hypoxia and hypercapnia (HCC)    MSSA bacteremia 02/03/2021   Central line infection    Pneumonia of both lower lobes due to methicillin susceptible Staphylococcus aureus (MSSA) (HCC)    Cardiogenic shock (HCC) 01/29/2021   Primary hypertension    Acute HFrEF (heart failure  with reduced ejection fraction) (HCC)    Acute CHF (congestive heart failure) (HCC) 01/18/2021    ONSET DATE: 3 years.   REFERRING DIAG:  Diagnosis  R26.89 (ICD-10-CM) - Imbalance    THERAPY DIAG:  Muscle weakness (generalized)  Unsteadiness on feet  Rationale for Evaluation and Treatment: Rehabilitation  SUBJECTIVE:  SUBJECTIVE STATEMENT:     Pt reports no changes since last visit. No falls or near-falls. No aches/pains.  Pt accompanied by: self  PERTINENT HISTORY:   From recent MD appointment. "Admitted to [facility] 01/2021  acute HFrEF. Echo  EF 25-30% from previous EF 60-65%. R/LHC w/ no CAD, moderately elevated left heart and pulmonary artery pressures, severely elevated right heart filling pressures, severely reduced Fick cardiac output/index.  Required intropes, pressor, and lasix drip.  Transferred to [facility] for cardiogenic/septic shock, + MSSA bacteremia. Underwent TEE showing EF 25-30%, RV moderately down, severe biatrial enlargement and moderate MR, no valvular vegetation. Required addition of mexitiline to suppress PVCs. Hospitalization c/b AKI and hypoxic/hypercarbic respiratory failure.   Discharged from [facility] 03/11/21 after he completed IV antibiotics and rehab.    At last visit midodrine stopped and GDMT started.    Sleep study 4/23 AHI 2   Here for routine f/u. Here for f/u with his wife. Says he is doing pretty well. 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 Walking 1 mile at the [place] almost everyday. And riding stationary bike for 30 mins every night."  PAIN:  Are you having pain? No  PRECAUTIONS: None  RED FLAGS: None   WEIGHT BEARING RESTRICTIONS: No  FALLS: Has patient fallen in last 6 months? No  LIVING ENVIRONMENT: Lives with: lives with their spouse Lives in:  House/apartment Stairs: Yes: Internal: 12 steps; on right going up and on left going up and External: 1 steps; on right going up and on left going up Has following equipment at home: Single point cane and does not use often    PLOF: Independent, Independent with basic ADLs, Independent with gait, and Independent with transfers  PATIENT GOALS: improve balance and strength in legs.   OBJECTIVE:  Note: Objective measures were completed at Evaluation unless otherwise noted.  DIAGNOSTIC FINDINGS:  None relevant   COGNITION: Overall cognitive status: Within functional limits for tasks assessed   SENSATION: Light touch: Impaired  and intermittent n/t in feet up to shins    COORDINATION: WFL   EDEMA:  Mild edema in bil LE   MUSCLE TONE: WFL   POSTURE: rounded shoulders and forward head  LOWER EXTREMITY ROM:     Grossly WFL  LOWER EXTREMITY MMT:    MMT Right Eval Left Eval  Hip flexion 4 4  Hip extension    Hip abduction 4+ 4+  Hip adduction 4 4  Hip internal rotation    Hip external rotation    Knee flexion 4+ 4+  Knee extension 4+ 4+  Ankle dorsiflexion 4+ 4+  Ankle plantarflexion    Ankle inversion    Ankle eversion    (Blank rows = not tested)  BED MOBILITY:  Sit to supine Complete Independence Supine to sit Complete Independence  TRANSFERS: Assistive device utilized: None  Sit to stand: Complete Independence Stand to sit: Complete Independence Chair to chair: Complete Independence Floor:  unable to complete .  Requires UE support topush from thighs to stand   RAMP:  Level of Assistance: Complete Independence Assistive device utilized:  Ramp Comments:   CURB:  Level of Assistance: Modified independence Assistive device utilized:  rail Curb Comments:   STAIRS: Level of Assistance: SBA Stair Negotiation Technique: Step to Pattern Alternating Pattern  with Single Rail on Right Number of Stairs: 4  Height of Stairs: 6  Comments: step to on  descent  GAIT: Gait pattern: lateral hip instability, trunk flexed, and wide BOS Distance walked:  915 Assistive device utilized: None Level of assistance: Complete Independence Comments: increased flexed posture with increased distance  FUNCTIONAL TESTS:  5 times sit to stand: 19.11 with UE support; 21.44 with UE support on knees.  Timed up and go (TUG): 15.64, 13.74 sec  6 minute walk test: 931ft  10 meter walk test: 12.43 and 11.68 Berg Balance Scale: TBD Dynamic Gait Index: TBD  PATIENT SURVEYS:  ABC scale 60                                                                                                                              TREATMENT DATE: DATE: 07/28/23    Unless otherwise stated, supervision/CGA was provided and gait belt donned in order to ensure pt safety throughout session.   TE: Sit to stand 1 x 10, 1x12 with two 5# DB at chest with both hands - rates medium first set, difficult second set   LAQ 5# AW 2 x 10 - Pt rates easy  Seated Hip flexion 5 # AW 2 x 10 - rates easy   Isometric hip Hip adduction x 12 bil with sec hold/rep with green pball  Standing heel/toe raises x 20 each LE - compensates with rocking/decreased ROM   Seated heel raise 20x bilat  Seated toe raise 20x bilat   Standing hip abduction 2 x 10 each LE    NMR: 6" step taps with no UE support 2 x 12 each LE   Fwd/bwd step overs orange hurdle x multiple reps - intermittent UE support  Lateral step over orange hurdle x multiple reps  - intermittent UE support   Tandem stance 2x30 sec each LE with intermittent UE support  SLB 2x30 sec each wit UUE support      PATIENT EDUCATION: Education details: exercise technique Person educated: Patient Education method: Explanation Education comprehension: verbalized understanding  HOME EXERCISE PROGRAM:  Access Code: URL: [email or web address] Date: 06/23/2023 Prepared by: Carlen Chasten  Exercises - Doorway  Pec Stretch at 60 Elevation  - 1 x daily - 7 x weekly - 2 sets - 1 minute hold - Standing Shoulder Row with Anchored Resistance  - 1 x daily - 7 x weekly - 3 sets - 10 reps - Low Trap Setting at Wall  - 1 x daily - 7 x weekly - 2 sets - 10 reps - Standing Tandem Balance with Counter Support  - 3 x weekly - 2 sets - 30 sec hold - Standing Near Stance in Corner with Eyes Closed  - 1 x daily - 7 x weekly - 2 sets - 30 seconds hold - Standing Single Leg Stance with Counter Support  - 3 x weekly - 3-5 sets - as long as possible hold - Standing March with Counter Support  - 3 x weekly - 3 sets - 10 reps - Side Stepping with Counter Support  - 1 x daily - 3 x weekly - 3 sets - 3  reps - Backward Walking with Counter Support  - 1 x daily - 7 x weekly - 3 sets - 5 reps   GOALS: Goals reviewed with patient? Yes  SHORT TERM GOALS: Target date: 06/09/2023   Patient will be independent in home exercise program to improve strength/mobility for better functional independence with ADLs. Baseline: 06/07/23: HEP provided  06/16/2023: pt reports consistent compliance with HEP 06/23/2023: updated HEP provided Goal status: IN PROGRESS   LONG TERM GOALS: Target date: 08/04/2023  Patient will increase ABC score to equal to or greater than 72  to demonstrate statistically significant improvement in mobility and quality of life.  Baseline: 60 06/16/2023: 55% 07/26/23: 70.6%  Goal status: IN PROGRESS  2.  Patient (> 36 years old) will complete five times sit to stand test in < 15 seconds indicating an increased LE strength and improved balance. Baseline: 21.44 06/16/2023: 18.38 seconds no UE support 07/26/23: 15.55 seconds no UE support  Goal status: IN PROGRESS  3.  Patient will increase Berg Balance score by > 6 points to demonstrate decreased fall risk during functional activities Baseline: 1/30= 38/56 06/16/2023: 40/56 07/26/23: 42/56 Goal status: IN PROGRESS  4.  Patient will increase 10 meter walk test to  >1.16m/s as to improve gait speed for better community ambulation and to reduce fall risk. Baseline: 0.83 m/s 06/16/2023: 0.83 m/s (12 seconds) without AD 07/26/23: 0.96 m/s with no AD  Goal status: IN PROGRESS  5.  Patient will reduce timed up and go to <11 seconds to reduce fall risk and demonstrate improved transfer/gait ability. Baseline: 14 sec  06/16/2023: 12.57 seconds no AD 07/26/23: 11.88 seconds no AD  Goal status: IN PROGRESS  6.  Patient will increase dynamic gait index score to >19/24 as to demonstrate reduced fall risk and improved dynamic gait balance for better safety with community/home ambulation.  Baseline: 05/12/23: 17 06/16/2023: 17/24 07/26/23: 18/24 Goal status: IN PROGRESS    ASSESSMENT:  CLINICAL IMPRESSION:    Continued plan as laid out in prior sessions. Pt very motivated throughout and able to advance some therex with either resistance level or volume. Pt continues to have most difficulty with SLB related tasks and obstacle clearance. Pt will continue to benefit from skilled physical therapy intervention to address impairments, improve QOL, decrease fall risk, and attain therapy goals.    OBJECTIVE IMPAIRMENTS: Abnormal gait, cardiopulmonary status limiting activity, decreased activity tolerance, decreased balance, decreased coordination, decreased endurance, decreased knowledge of use of DME, decreased mobility, difficulty walking, decreased ROM, decreased strength, hypomobility, impaired perceived functional ability, impaired flexibility, impaired sensation, improper body mechanics, postural dysfunction, and obesity.   ACTIVITY LIMITATIONS: carrying, lifting, bending, standing, squatting, stairs, transfers, and locomotion level  PARTICIPATION LIMITATIONS: cleaning, laundry, community activity, and occupation  PERSONAL FACTORS: Age and 1-2 comorbidities: obesity CHF  are also affecting patient's functional outcome.   REHAB POTENTIAL: Good  CLINICAL DECISION  MAKING: Stable/uncomplicated  EVALUATION COMPLEXITY: Moderate  PLAN:  PT FREQUENCY: 1-2x/week  PT DURATION: 12 weeks  PLANNED INTERVENTIONS: 97110-Therapeutic exercises, 97530- Therapeutic activity, 97112- Neuromuscular re-education, 97535- Self Care, 11914- Manual therapy, [phone]- Gait training, Patient/Family education, Balance training, Stair training, Taping, DME instructions, Cryotherapy, and Moist heat  PLAN FOR NEXT SESSION:  *Progress note* Dynamic balance interventions Fwd/back stepping over 1/2 foam roll vs hurdle with blaze pod dual-task  Static balance Tandem stance --> progress to eyes closed Dynamic gait training including: quick turning  stepping over obstacles agility ladder BLE strengthening   Maylon Peppers, PT, DPT  Physical Therapist - [facility]  [facility]  11:54 AM 07/28/23

## 2023-07-29 ENCOUNTER — Telehealth: Payer: Self-pay | Admitting: Internal Medicine

## 2023-07-29 NOTE — Telephone Encounter (Incomplete)
 Called to confirm/remind patient of their appointment at the [facility] on 08/01/23.   Appointment:   [x] Confirmed  [] Left mess   [] No answer/No voice mail  [] VM Full/unable to leave message  [] Phone not in service  Patient reminded to bring all medications and/or complete list.  Confirmed patient has transportation. Gave directions, instructed to utilize valet parking.

## 2023-08-01 ENCOUNTER — Ambulatory Visit (HOSPITAL_BASED_OUTPATIENT_CLINIC_OR_DEPARTMENT_OTHER): Admitting: Internal Medicine

## 2023-08-01 ENCOUNTER — Other Ambulatory Visit: Payer: Self-pay

## 2023-08-01 ENCOUNTER — Other Ambulatory Visit
Admission: RE | Admit: 2023-08-01 | Discharge: 2023-08-01 | Disposition: A | Discharge status: Home / Self Care | Attending: Internal Medicine | Admitting: Internal Medicine

## 2023-08-01 VITALS — BP 118/51 | HR 61 | Wt 267.0 lb

## 2023-08-01 DIAGNOSIS — I493 Ventricular premature depolarization: Secondary | ICD-10-CM | POA: Diagnosis not present

## 2023-08-01 DIAGNOSIS — I472 Ventricular tachycardia, unspecified: Secondary | ICD-10-CM | POA: Diagnosis not present

## 2023-08-01 DIAGNOSIS — Z87891 Personal history of nicotine dependence: Secondary | ICD-10-CM | POA: Insufficient documentation

## 2023-08-01 DIAGNOSIS — Z131 Encounter for screening for diabetes mellitus: Secondary | ICD-10-CM | POA: Insufficient documentation

## 2023-08-01 DIAGNOSIS — I5042 Chronic combined systolic (congestive) and diastolic (congestive) heart failure: Secondary | ICD-10-CM | POA: Diagnosis not present

## 2023-08-01 DIAGNOSIS — E669 Obesity, unspecified: Secondary | ICD-10-CM | POA: Diagnosis not present

## 2023-08-01 DIAGNOSIS — I447 Left bundle-branch block, unspecified: Secondary | ICD-10-CM | POA: Diagnosis not present

## 2023-08-01 DIAGNOSIS — I110 Hypertensive heart disease with heart failure: Secondary | ICD-10-CM | POA: Insufficient documentation

## 2023-08-01 DIAGNOSIS — I5022 Chronic systolic (congestive) heart failure: Secondary | ICD-10-CM | POA: Insufficient documentation

## 2023-08-01 DIAGNOSIS — Z6841 Body Mass Index (BMI) 40.0 and over, adult: Secondary | ICD-10-CM | POA: Insufficient documentation

## 2023-08-01 LAB — BASIC METABOLIC PANEL WITH GFR
Anion gap: 7 (ref 5–15)
BUN: 27 mg/dL — ABNORMAL HIGH (ref 8–23)
CO2: 27 mmol/L (ref 22–32)
Calcium: 8.3 mg/dL — ABNORMAL LOW (ref 8.9–10.3)
Chloride: 100 mmol/L (ref 98–111)
Creatinine, Ser: 1.28 mg/dL — ABNORMAL HIGH (ref 0.61–1.24)
GFR, Estimated: 57 mL/min — ABNORMAL LOW (ref >60)
Glucose, Bld: 94 mg/dL (ref 70–99)
Potassium: 4.5 mmol/L (ref 3.5–5.1)
Sodium: 134 mmol/L — ABNORMAL LOW (ref 135–145)

## 2023-08-01 LAB — CBC
HCT: 38.6 % — ABNORMAL LOW (ref 39.0–52.0)
Hemoglobin: 13.4 g/dL (ref 13.0–17.0)
MCH: 32.7 pg (ref 26.0–34.0)
MCHC: 34.7 g/dL (ref 30.0–36.0)
MCV: 94.1 fL (ref 80.0–100.0)
Platelets: 176 10*3/uL (ref 150–400)
RBC: 4.1 MIL/uL — ABNORMAL LOW (ref 4.22–5.81)
RDW: 12.6 % (ref 11.5–15.5)
WBC: 9.2 10*3/uL (ref 4.0–10.5)
nRBC: 0 % (ref 0.0–0.2)

## 2023-08-01 LAB — BRAIN NATRIURETIC PEPTIDE: B Natriuretic Peptide: 270.1 pg/mL — ABNORMAL HIGH (ref 0.0–100.0)

## 2023-08-01 MED ORDER — TORSEMIDE 20 MG PO TABS
40.0000 mg | ORAL_TABLET | Freq: Every day | ORAL | 3 refills | Status: DC
  Filled 2023-08-01 – 2023-09-19 (×6): qty 180, 90d supply, fill #0
  Filled ????-??-?? (×2): fill #0

## 2023-08-01 NOTE — Progress Notes (Unsigned)
 [facility]  NOTE  Primary Care: Westley Hammers, MD Primary HF Cardiologist: Dr. Julane Ny  HPI: [name] is a 78 y.o. with history includes combined systolic/diastolic HF, HTN, HLD , PVCs, and obesity. Had Echo 04/18/20  LVEF 60-65% w/ G2DD and normal RV.   Admitted to [facility] 10/22  acute HFrEF. Echo  EF 25-30% from previous EF 60-65%. R/LHC w/ no CAD, moderately elevated left heart and pulmonary artery pressures, severely elevated right heart filling pressures, severely reduced Fick cardiac output/index.  Required intropes, pressor, and lasix  drip.  Transferred to [facility] for cardiogenic/septic shock, + MSSA bacteremia. Underwent TEE showing EF 25-30%, RV moderately down, severe biatrial enlargement and moderate MR, no valvular vegetation. Required addition of mexitiline to suppress PVCs. Hospitalization c/b AKI and hypoxic/hypercarbic respiratory failure.  Discharged from [facility] 03/11/21 after he completed IV antibiotics and rehab.   At last visit midodrine  stopped and GDMT started.   Sleep study 4/23 AHI 2  Here for routine f/u. Here for f/u with his wife. 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 Used to go to [place] and walk 1 mile a day but says it is getting harder. Going to PT/OT to work on his balance. Gets fatigued. Denies SOB. Weight up a few pounds. Riding stationary bike for 30 mins every night.  Echo 07/19/23 EF 25-30% RV ok Personally reviewed  cMRI 5/24 EF 31% no LGE or infiltrative process. RVEF 43% Zio 4/24 SR - average rate 66. Nine runs NSVT  3.1% PVCs Echo  9/23 EF 35-40% moderate MR.  Echo 07/27/22 EF 30-35% moderate MR  Cardiac Studies Echo 2/23 EF 30%  RV normal.  Echo 04/2020 EF 60-65%.   LHC/RHC 01/2021  no CAD  RA 20 PA 48/29 (35)  PCWP 25 CO 3.7 CI 1.4  Past Medical History:  Diagnosis Date   Depression    Hypertension    Hypothyroidism    Current Outpatient Medications  Medication Sig Dispense Refill   Ascorbic Acid  (VITAMIN C) 1000 MG tablet Take 1,000 mg by mouth daily.     aspirin  325 MG tablet Take 325 mg by mouth daily.     Cholecalciferol (VITAMIN D3) 125 MCG (5000 UT) CAPS Take 1 capsule by mouth daily.     empagliflozin  (JARDIANCE ) 10 MG TABS tablet Take 1 tablet (10 mg total) by mouth daily before breakfast. 60 tablet 0   folic acid (FOLVITE) 400 MCG tablet Take 400 mcg by mouth daily.     levothyroxine  (SYNTHROID ) 75 MCG tablet Take 75 mcg by mouth daily before breakfast.     losartan  (COZAAR ) 25 MG tablet Take 0.5 tablets (12.5 mg total) by mouth daily. 90 tablet 3   metoprolol  succinate (TOPROL -XL) 25 MG 24 hr tablet Take 1 tablet (25 mg total) by mouth at bedtime with or immediately following a meal. 90 tablet 1   mexiletine (MEXITIL ) 150 MG capsule Take 1 capsule (150 mg total) by mouth every 12 (twelve) hours. 60 capsule 0   pravastatin  (PRAVACHOL ) 20 MG tablet Take 1 tablet (20 mg total) by mouth daily. 30 tablet 11   torsemide  (DEMADEX ) 20 MG tablet Take 20 mg by mouth daily.     vitamin B-12 (CYANOCOBALAMIN) 100 MCG tablet Take 100 mcg by mouth daily.     vitamin E 180 MG (400 UNITS) capsule Take 400 Units by mouth daily.     No current facility-administered medications for this visit.   No Known  Allergies  Social History   Socioeconomic History   Marital status: Married    Spouse name: Navin Dogan   Number of children: Not on file   Years of education: Not on file   Highest education level: Not on file  Occupational History   Not on file  Tobacco Use   Smoking status: Former   Smokeless tobacco: Former    Types: Engineer, drilling   Vaping status: Unknown  Substance and Sexual Activity   Alcohol use: No   Drug use: Not on file   Sexual activity: Not on file  Other Topics Concern   Not on file  Social History Narrative   Not on file   Social Drivers of Health   Financial Resource Strain: Not on file  Food Insecurity: Not on file  Transportation Needs: Not on file   Physical Activity: Not on file  Stress: Not on file  Social Connections: Not on file  Intimate Partner Violence: Not on file   No family history on file. Vitals:   08/01/23 1528  BP: (!) 118/51  Pulse: 61  SpO2: 97%     BP (!) 118/51   Pulse 61   Wt 267 lb (121.1 kg)   SpO2 97%   BMI 41.82 kg/m   Wt Readings from Last 3 Encounters:  08/01/23 267 lb (121.1 kg)  04/28/23 257 lb (116.6 kg)  03/18/23 261 lb (118.4 kg)   PHYSICAL EXAM: General:  Elderly obese fatigued. No resp difficulty HEENT: normal Neck: supple. no JVD. Carotids 2+ bilat; no bruits. No lymphadenopathy or thryomegaly appreciated. Cor: PMI nondisplaced. Regular rate & rhythm. No rubs, gallops or murmurs. Lungs: clear Abdomen: obese soft, nontender, nondistended. No hepatosplenomegaly. No bruits or masses. Good bowel sounds. Extremities: no cyanosis, clubbing, rash, 1+ edema Neuro: alert & orientedx3, cranial nerves grossly intact. moves all 4 extremities w/o difficulty. Affect pleasant  ReDS 31%  ASSESSMENT & PLAN:   1. Chronic Systolic Heart Failure  - Echo (10/22): EF down from 55-->20-25%.  - R/LHC (10/22): with no CAD, elevated filling pressures and low output heart failure.  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 Sleep study 3/23 negative (AHI 2) - Echo 05/14/21 EF 30% RV normal  - Echo 12/17/21 EF 35-40% moderate MR.  - Echo 07/27/22: EF 30-35% moderate MR - cMRI 5/24 EF 31% no LGE or infiltrative process. RVEF 43% - Zio 4/24 SR - average rate 66. Nine runs NSVT  3.1% PVCs - He is worse today with progressive exertional fatigue - Now NYHA III-IIIB - Volume status looks elevated on exam but ReDS only 31% (? R > L heart failure) - Echo 07/19/23 EF 25-30% RV ok Personally reviewed - Increase torsemide  20 -> 40 daily. - Continue jardiance  10 mg daily - Continue spironolactone  12.5 mg daily - Continue losartan  12.5  - Continue Toprol  25 unable  to tolerate higher - EF essentially unchanged despite PVC suppression. cMRI confirms NICM. Doubt LBBB wide enough to cause CM.  - Remains uninterested in ICD - Clinically much worse over last few weeks. Will proceed with RHC to further evalaute hemodynamics.   2. NSVT, PVC  - Remains in NSR.  PVCs suppressed - Zio 4/24 SR - average rate 66. Nine runs NSVT  3.1% PVCs - Continue mexilitene 150 bid - Sleep study negative 3/23 - Continue current therapy  3. Morbid Obesity  - Body mass index is 41.82 kg/m. - Continue  weight loss efforts. - Has refused GLP1RA in past. We discussed this at length againt today. - Will refer to HF PharmD  4. LBBB - plan as above - QRS 136 -  doubt this causing CM  5. Poor balance - Continue PT/OT eval  I spent a total of 45 minutes today: 1) reviewing the patient's medical records including previous charts, labs and recent notes from other providers; 2) examining the patient and counseling them on their medical issues/explaining the plan of care; 3) adjusting meds as needed and 4) ordering lab work or other needed tests.    Jules Oar MD 3:36 PM

## 2023-08-01 NOTE — Patient Instructions (Signed)
 Medication Changes:  INCREASE Torsemide  40mg  (2 tabs) daily  Lab Work:  Go over to the MEDICAL MALL. Go pass the gift shop and have your blood work completed.  We will only call you if the results are abnormal or if the provider would like to make medication changes.   Testing/Procedures:  . Anthony Weber  08/01/2023  You are scheduled for a Cardiac Catheterization on Monday, April 28 with Dr. Jules Oar.  1. Please arrive at the [facility] (Main Entrance A) at [facility]: [address] [place], [place] [address] at 5:30 AM (This time is 2 hour(s) before your procedure to ensure your preparation).   Free valet parking service is available. You will check in at ADMITTING. The support person will be asked to wait in the waiting room.  It is OK to have someone drop you off and come back when you are ready to be discharged.    Special note: Every effort is made to have your procedure done on time. Please understand that emergencies sometimes delay scheduled procedures.  2. Diet: Do not eat solid foods after midnight.  The patient may have clear liquids until 5am upon the day of the procedure.  3. Labs: You will need to have blood drawn on today at the [facility].  4. Medication instructions in preparation for your procedure: DO NOT TAKE JARDIANCE  OR TORSEMIDE  THE DAY OF YOUR PROCEDURE   Contrast Allergy: No   *For reference purposes while preparing patient instructions.   Delete this med list prior to printing instructions for patient.*   On the morning of your procedure, take your Aspirin  81 mg and any morning medicines NOT listed above.  You may use sips of water.  5. Plan to go home the same day, you will only stay overnight if medically necessary. 6. Bring a current list of your medications and current insurance cards. 7. You MUST have a responsible person to drive you home. 8. Someone MUST be with you the first 24 hours after you arrive home or your  discharge will be delayed. 9. Please wear clothes that are easy to get on and off and wear slip-on shoes.  Thank you for allowing us  to care for you!   -- [name]    Follow-Up in: We will let you know when to follow up after your cath on Monday.   At the [facility], you and your health needs are our priority. We have a designated team specialized in the treatment of Heart Failure. This Care Team includes your primary Heart Failure Specialized Cardiologist (physician), Advanced Practice Providers (APPs- Physician Assistants and Nurse Practitioners), and Pharmacist who all work together to provide you with the care you need, when you need it.   You may see any of the following providers on your designated Care Team at your next follow up:  Dr. Jules Oar Dr. Peder Bourdon Dr. Alwin Baars Dr. Judyth Nunnery Shawnee Dellen, FNP Bevely Brush, RPH-CPP  Please be sure to bring in all your medications bottles to every appointment.   Need to Contact Us :  If you have any questions or concerns before your next appointment please send us  a message through [place] or call our office at [phone].    TO LEAVE A MESSAGE FOR THE NURSE SELECT OPTION 2, PLEASE LEAVE A MESSAGE INCLUDING: YOUR NAME DATE OF BIRTH CALL BACK NUMBER REASON FOR CALL**this is important as we prioritize the call backs  YOU WILL RECEIVE A CALL BACK THE SAME DAY AS LONG AS YOU CALL BEFORE 4:00 PM

## 2023-08-01 NOTE — Progress Notes (Signed)
 ReDS Vest / Clip - 08/01/23 1500       ReDS Vest / Clip   Station Marker D    Ruler Value 39    ReDS Value Range Low volume    ReDS Actual Value 31

## 2023-08-02 ENCOUNTER — Ambulatory Visit: Payer: PPO | Admitting: Physical Therapy

## 2023-08-02 ENCOUNTER — Ambulatory Visit: Payer: PPO

## 2023-08-02 ENCOUNTER — Other Ambulatory Visit: Payer: Self-pay

## 2023-08-02 ENCOUNTER — Other Ambulatory Visit (HOSPITAL_COMMUNITY): Payer: Self-pay

## 2023-08-02 ENCOUNTER — Telehealth: Payer: Self-pay | Admitting: Pharmacist

## 2023-08-02 DIAGNOSIS — R2681 Unsteadiness on feet: Secondary | ICD-10-CM

## 2023-08-02 DIAGNOSIS — I5042 Chronic combined systolic (congestive) and diastolic (congestive) heart failure: Secondary | ICD-10-CM

## 2023-08-02 DIAGNOSIS — I509 Heart failure, unspecified: Secondary | ICD-10-CM

## 2023-08-02 DIAGNOSIS — I5022 Chronic systolic (congestive) heart failure: Secondary | ICD-10-CM

## 2023-08-02 DIAGNOSIS — R262 Difficulty in walking, not elsewhere classified: Secondary | ICD-10-CM

## 2023-08-02 LAB — HEMOGLOBIN A1C
Hgb A1c MFr Bld: 4.8 % (ref 4.8–5.6)
Mean Plasma Glucose: 91.06 mg/dL

## 2023-08-02 NOTE — Therapy (Signed)
 OUTPATIENT PHYSICAL THERAPY NEURO TREATMENT/RECERT   Patient Name: Anthony Weber MRN: [id number] DOB:02/12/1946, 78 y.o., male Today's Date: 08/02/2023   PCP: Westley Hammers, MD  REFERRING PROVIDER: Mardell Shade, MD   END OF SESSION:   PT End of Session - 08/02/23 1616     Visit Number 22    Number of Visits 46    Date for PT Re-Evaluation 10/25/23    Progress Note Due on Visit 10    PT Start Time 1534    PT Stop Time 1614    PT Time Calculation (min) 40 min    Equipment Utilized During Treatment Gait belt    Activity Tolerance Patient tolerated treatment well    Behavior During Therapy WFL for tasks assessed/performed                  Past Medical History:  Diagnosis Date   Depression    Hypertension    Hypothyroidism    Past Surgical History:  Procedure Laterality Date   COLONOSCOPY WITH PROPOFOL  N/A 03/03/2015   Procedure: COLONOSCOPY WITH PROPOFOL ;  Surgeon: Cassie Click, MD;  Location: [facility] ENDOSCOPY;  Service: Endoscopy;  Laterality: N/A;   EYE SURGERY     RIGHT/LEFT HEART CATH AND CORONARY ANGIOGRAPHY N/A 01/27/2021   Procedure: RIGHT/LEFT HEART CATH AND CORONARY ANGIOGRAPHY;  Surgeon: Sammy Crisp, MD;  Location: [facility] INVASIVE CV LAB;  Service: Cardiovascular;  Laterality: N/A;   TEE WITHOUT CARDIOVERSION N/A 02/05/2021   Procedure: TRANSESOPHAGEAL ECHOCARDIOGRAM (TEE);  Surgeon: Mardell Shade, MD;  Location: [facility] ENDOSCOPY;  Service: Cardiovascular;  Laterality: N/A;   Patient Active Problem List   Diagnosis Date Noted   Hyponatremia    Acute on chronic combined systolic and diastolic congestive heart failure (HCC)    Acute on chronic respiratory failure with hypoxia and hypercapnia (HCC)    MSSA bacteremia 02/03/2021   Central line infection    Pneumonia of both lower lobes due to methicillin susceptible Staphylococcus aureus (MSSA) (HCC)    Cardiogenic shock (HCC) 01/29/2021   Primary hypertension    Acute HFrEF  (heart failure with reduced ejection fraction) (HCC)    Acute CHF (congestive heart failure) (HCC) 01/18/2021    ONSET DATE: 3 years.   REFERRING DIAG:  Diagnosis  R26.89 (ICD-10-CM) - Imbalance    THERAPY DIAG:  Unsteadiness on feet  Difficulty in walking, not elsewhere classified  Rationale for Evaluation and Treatment: Rehabilitation  SUBJECTIVE:  SUBJECTIVE STATEMENT:     Pt reports no changes. Pt reports no pain/aches. No stumbles/falls.  Pt accompanied by: self  PERTINENT HISTORY:   From recent MD appointment. "Admitted to [facility] 01/2021  acute HFrEF. Echo  EF 25-30% from previous EF 60-65%. R/LHC w/ no CAD, moderately elevated left heart and pulmonary artery pressures, severely elevated right heart filling pressures, severely reduced Fick cardiac output/index.  Required intropes, pressor, and lasix  drip.  Transferred to [facility] for cardiogenic/septic shock, + MSSA bacteremia. Underwent TEE showing EF 25-30%, RV moderately down, severe biatrial enlargement and moderate MR, no valvular vegetation. Required addition of mexitiline to suppress PVCs. Hospitalization c/b AKI and hypoxic/hypercarbic respiratory failure.   Discharged from [facility] 03/11/21 after he completed IV antibiotics and rehab.    At last visit midodrine  stopped and GDMT started.    Sleep study 4/23 AHI 2   Here for routine f/u. Here for f/u with his wife. Says he is doing pretty well. 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 Walking 1 mile at the [employer] almost everyday. And riding stationary bike for 30 mins every night."  PAIN:  Are you having pain? No  PRECAUTIONS: None  RED FLAGS: None   WEIGHT BEARING RESTRICTIONS: No  FALLS: Has patient fallen in last 6 months? No  LIVING ENVIRONMENT: Lives with: lives with their  spouse Lives in: House/apartment Stairs: Yes: Internal: 12 steps; on right going up and on left going up and External: 1 steps; on right going up and on left going up Has following equipment at home: Single point cane and does not use often    PLOF: Independent, Independent with basic ADLs, Independent with gait, and Independent with transfers  PATIENT GOALS: improve balance and strength in legs.   OBJECTIVE:  Note: Objective measures were completed at Evaluation unless otherwise noted.  DIAGNOSTIC FINDINGS:  None relevant   COGNITION: Overall cognitive status: Within functional limits for tasks assessed   SENSATION: Light touch: Impaired  and intermittent n/t in feet up to shins    COORDINATION: WFL   EDEMA:  Mild edema in bil LE   MUSCLE TONE: WFL   POSTURE: rounded shoulders and forward head  LOWER EXTREMITY ROM:     Grossly WFL  LOWER EXTREMITY MMT:    MMT Right Eval Left Eval  Hip flexion 4 4  Hip extension    Hip abduction 4+ 4+  Hip adduction 4 4  Hip internal rotation    Hip external rotation    Knee flexion 4+ 4+  Knee extension 4+ 4+  Ankle dorsiflexion 4+ 4+  Ankle plantarflexion    Ankle inversion    Ankle eversion    (Blank rows = not tested)  BED MOBILITY:  Sit to supine Complete Independence Supine to sit Complete Independence  TRANSFERS: Assistive device utilized: None  Sit to stand: Complete Independence Stand to sit: Complete Independence Chair to chair: Complete Independence Floor:  unable to complete .  Requires UE support topush from thighs to stand   RAMP:  Level of Assistance: Complete Independence Assistive device utilized:  Ramp Comments:   CURB:  Level of Assistance: Modified independence Assistive device utilized:  rail Curb Comments:   STAIRS: Level of Assistance: SBA Stair Negotiation Technique: Step to Pattern Alternating Pattern  with Single Rail on Right Number of Stairs: 4  Height of Stairs:  6  Comments: step to on descent  GAIT: Gait pattern: lateral hip instability, trunk flexed, and wide BOS Distance walked: 915 Assistive device  utilized: None Level of assistance: Complete Independence Comments: increased flexed posture with increased distance  FUNCTIONAL TESTS:  5 times sit to stand: 19.11 with UE support; 21.44 with UE support on knees.  Timed up and go (TUG): 15.64, 13.74 sec  6 minute walk test: 966ft  10 meter walk test: 12.43 and 11.68 Berg Balance Scale: TBD Dynamic Gait Index: TBD  PATIENT SURVEYS:  ABC scale 60                                                                                                                              TREATMENT DATE: DATE: 08/02/23  Gait belt donned throughout for pt safety, CGA provided unless specified otherwise  NMR: Tandem stance 2x30 sec with UUE support One foot on floor, one on 6" step 2x30 sec with UUE support   Change in gait speed activity completed over 10 meters: Normal pace to fast pace 4 rounds Normal pace to slow pace 4 rounds Comments: some difficulty increasing speed  Step-length activity over 10 meters: Trial 1: 21 steps Trial 2: 18 steps Trial 3: 18 steps  Trial 4: 17 steps    Obstacle clearance: Over half-foam FWD and LTL (each way) 15x, 10x. Rates LTL stepping more difficult and requires UUE support Over orange hurdle FWD and LTL (each way) 10x intermittent UE support, more difficult LTL stepping.  Turning 360: To L 5x To R 5x - some unsteadiness - use of UE support to correct  Pt repeats turns to R 3x more after some rest, exhibits improved steadiness/does not require UE support  Alt step-tap onto 6" step 16x alt LE Alt step-tap onto 12" step initially with UE support then without 16x   Tandem gait at support bar (5 ft) 8x with UUE support    PATIENT EDUCATION: Education details: exercise technique, plan Person educated: Patient Education method: Archivist, Demo Education  comprehension: verbalized understanding, returned demo  HOME EXERCISE PROGRAM:  Access Code: URL: [email or web address] Date: 06/23/2023 Prepared by: Carlen Chasten  Exercises - Doorway Pec Stretch at 60 Elevation  - 1 x daily - 7 x weekly - 2 sets - 1 minute hold - Standing Shoulder Row with Anchored Resistance  - 1 x daily - 7 x weekly - 3 sets - 10 reps - Low Trap Setting at Wall  - 1 x daily - 7 x weekly - 2 sets - 10 reps - Standing Tandem Balance with Counter Support  - 3 x weekly - 2 sets - 30 sec hold - Standing Near Stance in Corner with Eyes Closed  - 1 x daily - 7 x weekly - 2 sets - 30 seconds hold - Standing Single Leg Stance with Counter Support  - 3 x weekly - 3-5 sets - as long as possible hold - Standing March with Counter Support  - 3 x weekly - 3 sets - 10 reps - Side Stepping with Counter Support  - 1 x  daily - 3 x weekly - 3 sets - 3 reps - Backward Walking with Counter Support  - 1 x daily - 7 x weekly - 3 sets - 5 reps   GOALS: Goals reviewed with patient? Yes  SHORT TERM GOALS: Target date: 09/13/2023    Patient will be independent in home exercise program to improve strength/mobility for better functional independence with ADLs. Baseline: 06/07/23: HEP provided  06/16/2023: pt reports consistent compliance with HEP 06/23/2023: updated HEP provided Goal status: IN PROGRESS   LONG TERM GOALS: Target date: 10/25/2023   Patient will increase ABC score to equal to or greater than 72  to demonstrate statistically significant improvement in mobility and quality of life.  Baseline: 60 06/16/2023: 55% 07/26/23: 70.6%  Goal status: IN PROGRESS  2.  Patient (> 20 years old) will complete five times sit to stand test in < 15 seconds indicating an increased LE strength and improved balance. Baseline: 21.44 06/16/2023: 18.38 seconds no UE support 07/26/23: 15.55 seconds no UE support  Goal status: IN PROGRESS  3.  Patient will increase Berg Balance  score by > 6 points to demonstrate decreased fall risk during functional activities Baseline: 1/30= 38/56 06/16/2023: 40/56 07/26/23: 42/56 Goal status: IN PROGRESS  4.  Patient will increase 10 meter walk test to >1.15m/s as to improve gait speed for better community ambulation and to reduce fall risk. Baseline: 0.83 m/s 06/16/2023: 0.83 m/s (12 seconds) without AD 07/26/23: 0.96 m/s with no AD  Goal status: IN PROGRESS  5.  Patient will reduce timed up and go to <11 seconds to reduce fall risk and demonstrate improved transfer/gait ability. Baseline: 14 sec  06/16/2023: 12.57 seconds no AD 07/26/23: 11.88 seconds no AD  Goal status: IN PROGRESS  6.  Patient will increase dynamic gait index score to >19/24 as to demonstrate reduced fall risk and improved dynamic gait balance for better safety with community/home ambulation.  Baseline: 05/12/23: 17 06/16/2023: 17/24 07/26/23: 18/24 Goal status: IN PROGRESS    ASSESSMENT:  CLINICAL IMPRESSION:    Recert submitted on this date. Pt just completed goal reassessment 07/26/23, showing general improvement with all goals (please refer to 4/15 note for details). As a result, today's session focused on balance, specifically areas where pt struggled on test day. Pt did exhibit improved obstacle clearance today with orange hurdle, although this is still challenging. Pt also struggles with static and dynamic activities that require a NBOS, and with increasing gait speed. Patient's condition has the potential to improve in response to therapy. Maximum improvement is yet to be obtained. The anticipated improvement is attainable and reasonable in a generally predictable time. Pt will continue to benefit from skilled physical therapy intervention to address impairments, improve QOL, decrease fall risk, and attain therapy goals.    OBJECTIVE IMPAIRMENTS: Abnormal gait, cardiopulmonary status limiting activity, decreased activity tolerance, decreased balance,  decreased coordination, decreased endurance, decreased knowledge of use of DME, decreased mobility, difficulty walking, decreased ROM, decreased strength, hypomobility, impaired perceived functional ability, impaired flexibility, impaired sensation, improper body mechanics, postural dysfunction, and obesity.   ACTIVITY LIMITATIONS: carrying, lifting, bending, standing, squatting, stairs, transfers, and locomotion level  PARTICIPATION LIMITATIONS: cleaning, laundry, community activity, and occupation  PERSONAL FACTORS: Age and 1-2 comorbidities: obesity CHF  are also affecting patient's functional outcome.   REHAB POTENTIAL: Good  CLINICAL DECISION MAKING: Stable/uncomplicated  EVALUATION COMPLEXITY: Moderate  PLAN:  PT FREQUENCY: 1-2x/week  PT DURATION: 12 weeks  PLANNED INTERVENTIONS: 97110-Therapeutic exercises, 97530- Therapeutic activity, 97112-  Neuromuscular re-education, [phone]- Self Care, 60454- Manual therapy, [phone]- Gait training, Patient/Family education, Balance training, Stair training, Taping, DME instructions, Cryotherapy, and Moist heat  PLAN FOR NEXT SESSION:  *Progress note* Dynamic balance interventions Fwd/back stepping over 1/2 foam roll vs hurdle with blaze pod dual-task  Static balance Tandem stance --> progress to eyes closed Dynamic gait training including: quick turning  stepping over obstacles agility ladder BLE strengthening   Aminta Kales PT, DPT  Physical Therapist - [facility]  [facility]  4:20 PM 08/02/23

## 2023-08-02 NOTE — Telephone Encounter (Signed)
 Called lab to see if there is enough sample to run A1c. They requested an order and will attempt to process the lab with the remaining sample. Patient's insurance plan only covers GLP-1RAs for diabetes.

## 2023-08-02 NOTE — Progress Notes (Signed)
Orders for cath placed. 

## 2023-08-03 ENCOUNTER — Other Ambulatory Visit (HOSPITAL_COMMUNITY): Payer: Self-pay

## 2023-08-03 ENCOUNTER — Telehealth: Payer: Self-pay | Admitting: Pharmacist

## 2023-08-03 ENCOUNTER — Other Ambulatory Visit: Payer: Self-pay

## 2023-08-03 MED ORDER — MOUNJARO 2.5 MG/0.5ML ~~LOC~~ SOAJ
2.5000 mg | SUBCUTANEOUS | 0 refills | Status: DC
  Filled 2023-08-03 – 2023-08-16 (×2): qty 2, 28d supply, fill #0

## 2023-08-03 NOTE — Telephone Encounter (Addendum)
 Mounjaro  prior authorization has been approved on the grounds of 2 blood glucose readings >125 mg/dL. Will send in Mounjaro  2.5 mg weekly and patient will present to clinic after catheterization for education and first dose.

## 2023-08-04 ENCOUNTER — Ambulatory Visit: Payer: PPO | Admitting: Physical Therapy

## 2023-08-04 ENCOUNTER — Ambulatory Visit: Payer: PPO

## 2023-08-04 ENCOUNTER — Other Ambulatory Visit

## 2023-08-04 DIAGNOSIS — R2681 Unsteadiness on feet: Secondary | ICD-10-CM

## 2023-08-04 DIAGNOSIS — M6281 Muscle weakness (generalized): Secondary | ICD-10-CM

## 2023-08-04 DIAGNOSIS — R262 Difficulty in walking, not elsewhere classified: Secondary | ICD-10-CM

## 2023-08-04 NOTE — Telephone Encounter (Signed)
 Error

## 2023-08-04 NOTE — Therapy (Signed)
 OUTPATIENT PHYSICAL THERAPY NEURO TREATMENT   Patient Name: Anthony Weber MRN: [id number] DOB:03-18-1946, 78 y.o., male Today's Date: 08/04/2023   PCP: Westley Hammers, MD  REFERRING PROVIDER: Mardell Shade, MD   END OF SESSION:          Past Medical History:  Diagnosis Date   Depression    Hypertension    Hypothyroidism    Past Surgical History:  Procedure Laterality Date   COLONOSCOPY WITH PROPOFOL  N/A 03/03/2015   Procedure: COLONOSCOPY WITH PROPOFOL ;  Surgeon: Cassie Click, MD;  Location: [facility] ENDOSCOPY;  Service: Endoscopy;  Laterality: N/A;   EYE SURGERY     RIGHT/LEFT HEART CATH AND CORONARY ANGIOGRAPHY N/A 01/27/2021   Procedure: RIGHT/LEFT HEART CATH AND CORONARY ANGIOGRAPHY;  Surgeon: Sammy Crisp, MD;  Location: [facility] INVASIVE CV LAB;  Service: Cardiovascular;  Laterality: N/A;   TEE WITHOUT CARDIOVERSION N/A 02/05/2021   Procedure: TRANSESOPHAGEAL ECHOCARDIOGRAM (TEE);  Surgeon: Mardell Shade, MD;  Location: [facility] ENDOSCOPY;  Service: Cardiovascular;  Laterality: N/A;   Patient Active Problem List   Diagnosis Date Noted   Hyponatremia    Acute on chronic combined systolic and diastolic congestive heart failure (HCC)    Acute on chronic respiratory failure with hypoxia and hypercapnia (HCC)    MSSA bacteremia 02/03/2021   Central line infection    Pneumonia of both lower lobes due to methicillin susceptible Staphylococcus aureus (MSSA) (HCC)    Cardiogenic shock (HCC) 01/29/2021   Primary hypertension    Acute HFrEF (heart failure with reduced ejection fraction) (HCC)    Acute CHF (congestive heart failure) (HCC) 01/18/2021    ONSET DATE: 3 years.   REFERRING DIAG:  Diagnosis  R26.89 (ICD-10-CM) - Imbalance    THERAPY DIAG:  No diagnosis found.  Rationale for Evaluation and Treatment: Rehabilitation  SUBJECTIVE:                                                                                                                                                                                              SUBJECTIVE STATEMENT:     Pt reports no changes.  No stumbles/falls. He reports no pain/aches.  Pt accompanied by: self  PERTINENT HISTORY:   From recent MD appointment. "Admitted to [facility] 01/2021  acute HFrEF. Echo  EF 25-30% from previous EF 60-65%. R/LHC w/ no CAD, moderately elevated left heart and pulmonary artery pressures, severely elevated right heart filling pressures, severely reduced Fick cardiac output/index.  Required intropes, pressor, and lasix  drip.  Transferred to [facility] for cardiogenic/septic shock, + MSSA bacteremia. Underwent TEE showing EF 25-30%, RV moderately down, severe biatrial enlargement and  moderate MR, no valvular vegetation. Required addition of mexitiline to suppress PVCs. Hospitalization c/b AKI and hypoxic/hypercarbic respiratory failure.   Discharged from [facility] 03/11/21 after he completed IV antibiotics and rehab.    At last visit midodrine  stopped and GDMT started.    Sleep study 4/23 AHI 2   Here for routine f/u. Here for f/u with his wife. Says he is doing pretty well. 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 Walking 1 mile at the [place] almost everyday. And riding stationary bike for 30 mins every night."  PAIN:  Are you having pain? No  PRECAUTIONS: None  RED FLAGS: None   WEIGHT BEARING RESTRICTIONS: No  FALLS: Has patient fallen in last 6 months? No  LIVING ENVIRONMENT: Lives with: lives with their spouse Lives in: House/apartment Stairs: Yes: Internal: 12 steps; on right going up and on left going up and External: 1 steps; on right going up and on left going up Has following equipment at home: Single point cane and does not use often    PLOF: Independent, Independent with basic ADLs, Independent with gait, and Independent with transfers  PATIENT GOALS: improve balance and strength in legs.   OBJECTIVE:  Note: Objective measures were completed at Evaluation unless  otherwise noted.  DIAGNOSTIC FINDINGS:  None relevant   COGNITION: Overall cognitive status: Within functional limits for tasks assessed   SENSATION: Light touch: Impaired  and intermittent n/t in feet up to shins    COORDINATION: WFL   EDEMA:  Mild edema in bil LE   MUSCLE TONE: WFL   POSTURE: rounded shoulders and forward head  LOWER EXTREMITY ROM:     Grossly WFL  LOWER EXTREMITY MMT:    MMT Right Eval Left Eval  Hip flexion 4 4  Hip extension    Hip abduction 4+ 4+  Hip adduction 4 4  Hip internal rotation    Hip external rotation    Knee flexion 4+ 4+  Knee extension 4+ 4+  Ankle dorsiflexion 4+ 4+  Ankle plantarflexion    Ankle inversion    Ankle eversion    (Blank rows = not tested)  BED MOBILITY:  Sit to supine Complete Independence Supine to sit Complete Independence  TRANSFERS: Assistive device utilized: None  Sit to stand: Complete Independence Stand to sit: Complete Independence Chair to chair: Complete Independence Floor:  unable to complete .  Requires UE support topush from thighs to stand   RAMP:  Level of Assistance: Complete Independence Assistive device utilized:  Ramp Comments:   CURB:  Level of Assistance: Modified independence Assistive device utilized:  rail Curb Comments:   STAIRS: Level of Assistance: SBA Stair Negotiation Technique: Step to Pattern Alternating Pattern  with Single Rail on Right Number of Stairs: 4  Height of Stairs: 6  Comments: step to on descent  GAIT: Gait pattern: lateral hip instability, trunk flexed, and wide BOS Distance walked: 915 Assistive device utilized: None Level of assistance: Complete Independence Comments: increased flexed posture with increased distance  FUNCTIONAL TESTS:  5 times sit to stand: 19.11 with UE support; 21.44 with UE support on knees.  Timed up and go (TUG): 15.64, 13.74 sec  6 minute walk test: 963ft  10 meter walk test: 12.43 and 11.68 Berg Balance  Scale: TBD Dynamic Gait Index: TBD  PATIENT SURVEYS:  ABC scale 60  TREATMENT DATE: DATE: 08/04/23  Gait belt donned throughout for pt safety, CGA provided unless specified otherwise   NMR: Tandem stance 2x30 sec with intermittent UE support - rates hard  One foot on floor, one on 6" step 2x30 sec with UUE support  Alt LE tap onto 6" step for SLB progression 2x20   Change in gait speed activity completed over 10 meters: Normal pace to fast pace 4 rounds Normal pace to slow pace 4 rounds Comments: continued difficulty increasing speed, able to slow dance from typical pace  Step-length activity over 10 meters: with 2# aw each LE Trial 1: 18 steps Trial 2: 17 steps  Trial 3: 18 steps  Trial 4: 17 steps    Gait with focus on increased step length, dynamic balance with 2# aw each LE 2x296 ft - additional emphasis on endurance. Pt rates easy but with observed increased in RR/catching breath.  Obstacle clearance: Over half-foam FWD and LTL (each way) x multiple reps each way, requires intermittent UE support Over orange hurdle FWD and LTL (each way) 10x for each -occ UE support, better than with half-foam  Tandem gait at support bar (5 ft) 10x with UUE support    PATIENT EDUCATION: Education details: exercise technique, Person educated: Patient Education method: Archivist, Demo Education comprehension: verbalized understanding, returned demo  HOME EXERCISE PROGRAM:  Access Code: URL: [email or web address] Date: 06/23/2023 Prepared by: Carlen Chasten  Exercises - Doorway Pec Stretch at 60 Elevation  - 1 x daily - 7 x weekly - 2 sets - 1 minute hold - Standing Shoulder Row with Anchored Resistance  - 1 x daily - 7 x weekly - 3 sets - 10 reps - Low Trap Setting at Wall  - 1 x daily - 7 x weekly - 2 sets - 10 reps - Standing  Tandem Balance with Counter Support  - 3 x weekly - 2 sets - 30 sec hold - Standing Near Stance in Corner with Eyes Closed  - 1 x daily - 7 x weekly - 2 sets - 30 seconds hold - Standing Single Leg Stance with Counter Support  - 3 x weekly - 3-5 sets - as long as possible hold - Standing March with Counter Support  - 3 x weekly - 3 sets - 10 reps - Side Stepping with Counter Support  - 1 x daily - 3 x weekly - 3 sets - 3 reps - Backward Walking with Counter Support  - 1 x daily - 7 x weekly - 3 sets - 5 reps   GOALS: Goals reviewed with patient? Yes  SHORT TERM GOALS: Target date: 09/13/2023    Patient will be independent in home exercise program to improve strength/mobility for better functional independence with ADLs. Baseline: 06/07/23: HEP provided  06/16/2023: pt reports consistent compliance with HEP 06/23/2023: updated HEP provided Goal status: IN PROGRESS   LONG TERM GOALS: Target date: 10/25/2023   Patient will increase ABC score to equal to or greater than 72  to demonstrate statistically significant improvement in mobility and quality of life.  Baseline: 60 06/16/2023: 55% 07/26/23: 70.6%  Goal status: IN PROGRESS  2.  Patient (> 73 years old) will complete five times sit to stand test in < 15 seconds indicating an increased LE strength and improved balance. Baseline: 21.44 06/16/2023: 18.38 seconds no UE support 07/26/23: 15.55 seconds no UE support  Goal status: IN PROGRESS  3.  Patient will increase Berg Balance score by > 6 points to  demonstrate decreased fall risk during functional activities Baseline: 1/30= 38/56 06/16/2023: 40/56 07/26/23: 42/56 Goal status: IN PROGRESS  4.  Patient will increase 10 meter walk test to >1.90m/s as to improve gait speed for better community ambulation and to reduce fall risk. Baseline: 0.83 m/s 06/16/2023: 0.83 m/s (12 seconds) without AD 07/26/23: 0.96 m/s with no AD  Goal status: IN PROGRESS  5.  Patient will reduce timed up and go to  <11 seconds to reduce fall risk and demonstrate improved transfer/gait ability. Baseline: 14 sec  06/16/2023: 12.57 seconds no AD 07/26/23: 11.88 seconds no AD  Goal status: IN PROGRESS  6.  Patient will increase dynamic gait index score to >19/24 as to demonstrate reduced fall risk and improved dynamic gait balance for better safety with community/home ambulation.  Baseline: 05/12/23: 17 06/16/2023: 17/24 07/26/23: 18/24 Goal status: IN PROGRESS    ASSESSMENT:  CLINICAL IMPRESSION:    Pt tolerates interventions well but with observed mild fatigue, however, does not require much rest between interventions. Pt able to progress step-length and dynamic balance activity to completing with ankle weights for addition of LE strengthening.  Pt able to more consistently clear higher obstacles than lower ones, may indicate difficulty with planning motor task. Will continue to target this deficit. Pt will continue to benefit from skilled physical therapy intervention to address impairments, improve QOL, decrease fall risk, and attain therapy goals.    OBJECTIVE IMPAIRMENTS: Abnormal gait, cardiopulmonary status limiting activity, decreased activity tolerance, decreased balance, decreased coordination, decreased endurance, decreased knowledge of use of DME, decreased mobility, difficulty walking, decreased ROM, decreased strength, hypomobility, impaired perceived functional ability, impaired flexibility, impaired sensation, improper body mechanics, postural dysfunction, and obesity.   ACTIVITY LIMITATIONS: carrying, lifting, bending, standing, squatting, stairs, transfers, and locomotion level  PARTICIPATION LIMITATIONS: cleaning, laundry, community activity, and occupation  PERSONAL FACTORS: Age and 1-2 comorbidities: obesity CHF  are also affecting patient's functional outcome.   REHAB POTENTIAL: Good  CLINICAL DECISION MAKING: Stable/uncomplicated  EVALUATION COMPLEXITY: Moderate  PLAN:  PT  FREQUENCY: 1-2x/week  PT DURATION: 12 weeks  PLANNED INTERVENTIONS: 97110-Therapeutic exercises, 97530- Therapeutic activity, 97112- Neuromuscular re-education, 97535- Self Care, [id number]- Manual therapy, [phone]- Gait training, Patient/Family education, Balance training, Stair training, Taping, DME instructions, Cryotherapy, and Moist heat  PLAN FOR NEXT SESSION:  *Progress note* Dynamic balance interventions Fwd/back stepping over 1/2 foam roll vs hurdle with blaze pod dual-task  Static balance Tandem stance --> progress to eyes closed Dynamic gait training including: quick turning  stepping over obstacles agility ladder BLE strengthening   Aminta Kales PT, DPT  Physical Therapist - [facility]  11:03 AM 08/04/23

## 2023-08-08 ENCOUNTER — Other Ambulatory Visit: Payer: Self-pay

## 2023-08-08 ENCOUNTER — Encounter (HOSPITAL_COMMUNITY)
Admission: RE | Disposition: A | Discharge status: Home / Self Care | Payer: Self-pay | Source: Home / Self Care | Attending: Internal Medicine

## 2023-08-08 ENCOUNTER — Encounter (HOSPITAL_COMMUNITY): Payer: Self-pay | Admitting: Internal Medicine

## 2023-08-08 ENCOUNTER — Ambulatory Visit (HOSPITAL_COMMUNITY)
Admission: RE | Admit: 2023-08-08 | Discharge: 2023-08-08 | Disposition: A | Discharge status: Home / Self Care | Attending: Internal Medicine | Admitting: Internal Medicine

## 2023-08-08 ENCOUNTER — Other Ambulatory Visit (HOSPITAL_COMMUNITY): Payer: Self-pay

## 2023-08-08 DIAGNOSIS — R2689 Other abnormalities of gait and mobility: Secondary | ICD-10-CM | POA: Insufficient documentation

## 2023-08-08 DIAGNOSIS — I428 Other cardiomyopathies: Secondary | ICD-10-CM | POA: Diagnosis not present

## 2023-08-08 DIAGNOSIS — I493 Ventricular premature depolarization: Secondary | ICD-10-CM | POA: Insufficient documentation

## 2023-08-08 DIAGNOSIS — E785 Hyperlipidemia, unspecified: Secondary | ICD-10-CM | POA: Insufficient documentation

## 2023-08-08 DIAGNOSIS — I110 Hypertensive heart disease with heart failure: Secondary | ICD-10-CM | POA: Diagnosis not present

## 2023-08-08 DIAGNOSIS — I5042 Chronic combined systolic (congestive) and diastolic (congestive) heart failure: Secondary | ICD-10-CM | POA: Insufficient documentation

## 2023-08-08 DIAGNOSIS — I5022 Chronic systolic (congestive) heart failure: Secondary | ICD-10-CM

## 2023-08-08 DIAGNOSIS — I447 Left bundle-branch block, unspecified: Secondary | ICD-10-CM | POA: Diagnosis not present

## 2023-08-08 DIAGNOSIS — Z79899 Other long term (current) drug therapy: Secondary | ICD-10-CM | POA: Diagnosis not present

## 2023-08-08 DIAGNOSIS — Z6836 Body mass index (BMI) 36.0-36.9, adult: Secondary | ICD-10-CM | POA: Insufficient documentation

## 2023-08-08 LAB — BASIC METABOLIC PANEL WITH GFR
Anion gap: 9 (ref 5–15)
BUN: 32 mg/dL — ABNORMAL HIGH (ref 8–23)
CO2: 24 mmol/L (ref 22–32)
Calcium: 8.8 mg/dL — ABNORMAL LOW (ref 8.9–10.3)
Chloride: 101 mmol/L (ref 98–111)
Creatinine, Ser: 1.21 mg/dL (ref 0.61–1.24)
GFR, Estimated: >60 mL/min (ref >60)
Glucose, Bld: 99 mg/dL (ref 70–99)
Potassium: 4.4 mmol/L (ref 3.5–5.1)
Sodium: 134 mmol/L — ABNORMAL LOW (ref 135–145)

## 2023-08-08 LAB — POCT I-STAT 7, (LYTES, BLD GAS, ICA,H+H)
Acid-Base Excess: 2 mmol/L (ref 0.0–2.0)
Acid-Base Excess: 3 mmol/L — ABNORMAL HIGH (ref 0.0–2.0)
Bicarbonate: 28.1 mmol/L — ABNORMAL HIGH (ref 20.0–28.0)
Bicarbonate: 28.3 mmol/L — ABNORMAL HIGH (ref 20.0–28.0)
Calcium, Ion: 1.18 mmol/L (ref 1.15–1.40)
Calcium, Ion: 1.19 mmol/L (ref 1.15–1.40)
HCT: 36 % — ABNORMAL LOW (ref 39.0–52.0)
HCT: 37 % — ABNORMAL LOW (ref 39.0–52.0)
Hemoglobin: 12.2 g/dL — ABNORMAL LOW (ref 13.0–17.0)
Hemoglobin: 12.6 g/dL — ABNORMAL LOW (ref 13.0–17.0)
O2 Saturation: 69 %
O2 Saturation: 70 %
Potassium: 4.1 mmol/L (ref 3.5–5.1)
Potassium: 4.1 mmol/L (ref 3.5–5.1)
Sodium: 136 mmol/L (ref 135–145)
Sodium: 137 mmol/L (ref 135–145)
TCO2: 30 mmol/L (ref 22–32)
TCO2: 30 mmol/L (ref 22–32)
pCO2 arterial: 46.7 mmHg (ref 32–48)
pCO2 arterial: 47.7 mmHg (ref 32–48)
pH, Arterial: 7.379 (ref 7.35–7.45)
pH, Arterial: 7.391 (ref 7.35–7.45)
pO2, Arterial: 37 mmHg — CL (ref 83–108)
pO2, Arterial: 37 mmHg — CL (ref 83–108)

## 2023-08-08 SURGERY — RIGHT HEART CATH
Anesthesia: LOCAL

## 2023-08-08 MED ORDER — SODIUM CHLORIDE 0.9 % IV SOLN: INTRAVENOUS | Status: DC

## 2023-08-08 MED ORDER — LIDOCAINE HCL (PF) 1 % IJ SOLN
INTRAMUSCULAR | Status: DC | PRN
  Administered 2023-08-08: 2 mL

## 2023-08-08 MED ORDER — ACETAMINOPHEN 325 MG PO TABS: 650.0000 mg | ORAL_TABLET | ORAL | Status: DC | PRN

## 2023-08-08 MED ORDER — ASPIRIN 81 MG PO CHEW: 81.0000 mg | CHEWABLE_TABLET | ORAL | Status: DC

## 2023-08-08 MED ORDER — HEPARIN (PORCINE) IN NACL 1000-0.9 UT/500ML-% IV SOLN
INTRAVENOUS | Status: DC | PRN
  Administered 2023-08-08: 500 mL

## 2023-08-08 MED ORDER — HYDRALAZINE HCL 20 MG/ML IJ SOLN: 10.0000 mg | INTRAMUSCULAR | Status: DC | PRN

## 2023-08-08 MED ORDER — LIDOCAINE HCL (PF) 1 % IJ SOLN
INTRAMUSCULAR | Status: AC
  Filled 2023-08-08: qty 30

## 2023-08-08 MED ORDER — SODIUM CHLORIDE 0.9% FLUSH: 3.0000 mL | Freq: Two times a day (BID) | INTRAVENOUS | Status: DC

## 2023-08-08 MED ORDER — ONDANSETRON HCL 4 MG/2ML IJ SOLN: 4.0000 mg | Freq: Four times a day (QID) | INTRAMUSCULAR | Status: DC | PRN

## 2023-08-08 MED ORDER — SODIUM CHLORIDE 0.9% FLUSH: 3.0000 mL | INTRAVENOUS | Status: DC | PRN

## 2023-08-08 MED ORDER — SODIUM CHLORIDE 0.9 % IV SOLN: 250.0000 mL | INTRAVENOUS | Status: DC | PRN

## 2023-08-08 SURGICAL SUPPLY — 6 items
CATH SWAN GANZ 7F STRAIGHT (CATHETERS) IMPLANT
GLIDESHEATH SLENDER 7FR .021G (SHEATH) IMPLANT
GUIDEWIRE .025 260CM (WIRE) IMPLANT
PACK CARDIAC CATHETERIZATION (CUSTOM PROCEDURE TRAY) ×1 IMPLANT
TRANSDUCER W/STOPCOCK (MISCELLANEOUS) IMPLANT
TUBING ART PRESS 72 MALE/FEM (TUBING) IMPLANT

## 2023-08-08 NOTE — Discharge Instructions (Addendum)
 Brachial Site Care Take 40mg  torsemide  Monday and Friday, 20mg  dose on remaining days of the week.  This sheet gives you information about how to care for yourself after your procedure. Your health care provider may also give you more specific instructions. If you have problems or questions, contact your health care provider. What can I expect after the procedure? After the procedure, it is common to have: Bruising and tenderness at the catheter insertion area. Follow these instructions at home:  Insertion site care Follow instructions from your health care provider about how to take care of your insertion site. Make sure you: Wash your hands with soap and water before you change your bandage (dressing). If soap and water are not available, use hand sanitizer. Remove your dressing as told by your health care provider. In 24 hours Check your insertion site every day for signs of infection. Check for: Redness, swelling, or pain. Pus or a bad smell. Warmth. You may shower 24 hours after the procedure. Do not apply powder or lotion to the site.  Activity For 24 hours after the procedure, or as directed by your health care provider: Do not push or pull heavy objects with the affected arm. Do not drive yourself home from the hospital or clinic. You may drive 24 hours after the procedure unless your health care provider tells you not to. Do not lift anything that is heavier than 10 lb (4.5 kg), or the limit that you are told, until your health care provider says that it is safe.  For 24 hours

## 2023-08-09 ENCOUNTER — Other Ambulatory Visit: Payer: Self-pay

## 2023-08-09 ENCOUNTER — Ambulatory Visit: Payer: PPO | Admitting: Physical Therapy

## 2023-08-09 ENCOUNTER — Other Ambulatory Visit (HOSPITAL_COMMUNITY): Payer: Self-pay

## 2023-08-09 ENCOUNTER — Ambulatory Visit: Payer: PPO

## 2023-08-09 DIAGNOSIS — Z85828 Personal history of other malignant neoplasm of skin: Secondary | ICD-10-CM | POA: Diagnosis not present

## 2023-08-09 DIAGNOSIS — R2689 Other abnormalities of gait and mobility: Secondary | ICD-10-CM

## 2023-08-09 DIAGNOSIS — M6281 Muscle weakness (generalized): Secondary | ICD-10-CM

## 2023-08-09 DIAGNOSIS — D2272 Melanocytic nevi of left lower limb, including hip: Secondary | ICD-10-CM | POA: Diagnosis not present

## 2023-08-09 DIAGNOSIS — R262 Difficulty in walking, not elsewhere classified: Secondary | ICD-10-CM

## 2023-08-09 DIAGNOSIS — D2261 Melanocytic nevi of right upper limb, including shoulder: Secondary | ICD-10-CM | POA: Diagnosis not present

## 2023-08-09 DIAGNOSIS — D225 Melanocytic nevi of trunk: Secondary | ICD-10-CM | POA: Diagnosis not present

## 2023-08-09 DIAGNOSIS — R2681 Unsteadiness on feet: Secondary | ICD-10-CM

## 2023-08-09 DIAGNOSIS — D2262 Melanocytic nevi of left upper limb, including shoulder: Secondary | ICD-10-CM | POA: Diagnosis not present

## 2023-08-09 DIAGNOSIS — L570 Actinic keratosis: Secondary | ICD-10-CM | POA: Diagnosis not present

## 2023-08-09 NOTE — Therapy (Signed)
 OUTPATIENT PHYSICAL THERAPY NEURO TREATMENT   Patient Name: Anthony Weber MRN: [id number] DOB:Mar 31, 1946, 78 y.o., male Today's Date: 08/09/2023   PCP: Westley Hammers, MD  REFERRING PROVIDER: Mardell Shade, MD   END OF SESSION:   PT End of Session - 08/09/23 1532     Visit Number 24    Number of Visits 46    Date for PT Re-Evaluation 10/25/23    Progress Note Due on Visit 10    PT Start Time 1531    PT Stop Time 1611    PT Time Calculation (min) 40 min    Equipment Utilized During Treatment Gait belt    Activity Tolerance Patient tolerated treatment well    Behavior During Therapy WFL for tasks assessed/performed                   Past Medical History:  Diagnosis Date   Depression    Hypertension    Hypothyroidism    Past Surgical History:  Procedure Laterality Date   COLONOSCOPY WITH PROPOFOL  N/A 03/03/2015   Procedure: COLONOSCOPY WITH PROPOFOL ;  Surgeon: Cassie Click, MD;  Location: [facility] ENDOSCOPY;  Service: Endoscopy;  Laterality: N/A;   EYE SURGERY     RIGHT HEART CATH N/A 08/08/2023   Procedure: RIGHT HEART CATH;  Surgeon: Mardell Shade, MD;  Location: [facility] INVASIVE CV LAB;  Service: Cardiovascular;  Laterality: N/A;   RIGHT/LEFT HEART CATH AND CORONARY ANGIOGRAPHY N/A 01/27/2021   Procedure: RIGHT/LEFT HEART CATH AND CORONARY ANGIOGRAPHY;  Surgeon: Sammy Crisp, MD;  Location: [facility] INVASIVE CV LAB;  Service: Cardiovascular;  Laterality: N/A;   TEE WITHOUT CARDIOVERSION N/A 02/05/2021   Procedure: TRANSESOPHAGEAL ECHOCARDIOGRAM (TEE);  Surgeon: Mardell Shade, MD;  Location: [facility] ENDOSCOPY;  Service: Cardiovascular;  Laterality: N/A;   Patient Active Problem List   Diagnosis Date Noted   Hyponatremia    Acute on chronic combined systolic and diastolic congestive heart failure (HCC)    Acute on chronic respiratory failure with hypoxia and hypercapnia (HCC)    MSSA bacteremia 02/03/2021   Central line infection    Pneumonia  of both lower lobes due to methicillin susceptible Staphylococcus aureus (MSSA) (HCC)    Cardiogenic shock (HCC) 01/29/2021   Primary hypertension    Acute HFrEF (heart failure with reduced ejection fraction) (HCC)    Acute CHF (congestive heart failure) (HCC) 01/18/2021    ONSET DATE: 3 years.   REFERRING DIAG:  Diagnosis  R26.89 (ICD-10-CM) - Imbalance    THERAPY DIAG:  Unsteadiness on feet  Muscle weakness (generalized)  Difficulty in walking, not elsewhere classified  Balance disorder  Imbalance  Rationale for Evaluation and Treatment: Rehabilitation  SUBJECTIVE:  SUBJECTIVE STATEMENT:     Pt reports doing okay. States feeling "about the same." Denies any falls.   Pt accompanied by: self  PERTINENT HISTORY:   From recent MD appointment. "Admitted to [facility] 01/2021  acute HFrEF. Echo  EF 25-30% from previous EF 60-65%. R/LHC w/ no CAD, moderately elevated left heart and pulmonary artery pressures, severely elevated right heart filling pressures, severely reduced Fick cardiac output/index.  Required intropes, pressor, and lasix  drip.  Transferred to [facility] for cardiogenic/septic shock, + MSSA bacteremia. Underwent TEE showing EF 25-30%, RV moderately down, severe biatrial enlargement and moderate MR, no valvular vegetation. Required addition of mexitiline to suppress PVCs. Hospitalization c/b AKI and hypoxic/hypercarbic respiratory failure.   Discharged from [facility] 03/11/21 after he completed IV antibiotics and rehab.    At last visit midodrine  stopped and GDMT started.    Sleep study 4/23 AHI 2   Here for routine f/u. Here for f/u with his wife. Says he is doing pretty well. 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 Walking 1 mile at the [place] almost everyday. And riding stationary bike for 30  mins every night."  PAIN:  Are you having pain? No  PRECAUTIONS: None  RED FLAGS: None   WEIGHT BEARING RESTRICTIONS: No  FALLS: Has patient fallen in last 6 months? No  LIVING ENVIRONMENT: Lives with: lives with their spouse Lives in: House/apartment Stairs: Yes: Internal: 12 steps; on right going up and on left going up and External: 1 steps; on right going up and on left going up Has following equipment at home: Single point cane and does not use often    PLOF: Independent, Independent with basic ADLs, Independent with gait, and Independent with transfers  PATIENT GOALS: improve balance and strength in legs.   OBJECTIVE:  Note: Objective measures were completed at Evaluation unless otherwise noted.  DIAGNOSTIC FINDINGS:  None relevant   COGNITION: Overall cognitive status: Within functional limits for tasks assessed   SENSATION: Light touch: Impaired  and intermittent n/t in feet up to shins    COORDINATION: WFL   EDEMA:  Mild edema in bil LE   MUSCLE TONE: WFL   POSTURE: rounded shoulders and forward head  LOWER EXTREMITY ROM:     Grossly WFL  LOWER EXTREMITY MMT:    MMT Right Eval Left Eval  Hip flexion 4 4  Hip extension    Hip abduction 4+ 4+  Hip adduction 4 4  Hip internal rotation    Hip external rotation    Knee flexion 4+ 4+  Knee extension 4+ 4+  Ankle dorsiflexion 4+ 4+  Ankle plantarflexion    Ankle inversion    Ankle eversion    (Blank rows = not tested)  BED MOBILITY:  Sit to supine Complete Independence Supine to sit Complete Independence  TRANSFERS: Assistive device utilized: None  Sit to stand: Complete Independence Stand to sit: Complete Independence Chair to chair: Complete Independence Floor:  unable to complete .  Requires UE support topush from thighs to stand   RAMP:  Level of Assistance: Complete Independence Assistive device utilized:  Ramp Comments:   CURB:  Level of Assistance: Modified  independence Assistive device utilized:  rail Curb Comments:   STAIRS: Level of Assistance: SBA Stair Negotiation Technique: Step to Pattern Alternating Pattern  with Single Rail on Right Number of Stairs: 4  Height of Stairs: 6  Comments: step to on descent  GAIT: Gait pattern: lateral hip instability, trunk flexed, and wide BOS Distance walked:  915 Assistive device utilized: None Level of assistance: Complete Independence Comments: increased flexed posture with increased distance  FUNCTIONAL TESTS:  5 times sit to stand: 19.11 with UE support; 21.44 with UE support on knees.  Timed up and go (TUG): 15.64, 13.74 sec  6 minute walk test: 975ft  10 meter walk test: 12.43 and 11.68 Berg Balance Scale: TBD Dynamic Gait Index: TBD  PATIENT SURVEYS:  ABC scale 60                                                                                                                              TREATMENT DATE: DATE: 08/09/23  Gait belt donned throughout for pt safety, CGA provided unless specified otherwise   Therapeutic Activities: dynamic therapeutic activities  designed to achieve improved functional performance   Step up 3# AW x 15 alt LE Side step up then down on opp side- 3# AW x 15 reps with min UE support Forward lunge- 3#AW x 15 reps each LE Sit to stand x 12 reps x 2 sets  without UE  Standing calf raises 2 x 12 reps with 3# Standing side step over 1/2 foam x 15 reps with 3# Resistive walking 3#  x 450 feet.    PATIENT EDUCATION: Education details: exercise technique, Person educated: Patient Education method: Archivist, Demo Education comprehension: verbalized understanding, returned demo  HOME EXERCISE PROGRAM:  Access Code: URL: [email or web address] Date: 06/23/2023 Prepared by: Carlen Chasten  Exercises - Doorway Pec Stretch at 60 Elevation  - 1 x daily - 7 x weekly - 2 sets - 1 minute hold - Standing Shoulder Row with Anchored  Resistance  - 1 x daily - 7 x weekly - 3 sets - 10 reps - Low Trap Setting at Wall  - 1 x daily - 7 x weekly - 2 sets - 10 reps - Standing Tandem Balance with Counter Support  - 3 x weekly - 2 sets - 30 sec hold - Standing Near Stance in Corner with Eyes Closed  - 1 x daily - 7 x weekly - 2 sets - 30 seconds hold - Standing Single Leg Stance with Counter Support  - 3 x weekly - 3-5 sets - as long as possible hold - Standing March with Counter Support  - 3 x weekly - 3 sets - 10 reps - Side Stepping with Counter Support  - 1 x daily - 3 x weekly - 3 sets - 3 reps - Backward Walking with Counter Support  - 1 x daily - 7 x weekly - 3 sets - 5 reps   GOALS: Goals reviewed with patient? Yes  SHORT TERM GOALS: Target date: 09/13/2023    Patient will be independent in home exercise program to improve strength/mobility for better functional independence with ADLs. Baseline: 06/07/23: HEP provided  06/16/2023: pt reports consistent compliance with HEP 06/23/2023: updated HEP provided Goal status: IN PROGRESS   LONG TERM GOALS: Target date:  10/25/2023   Patient will increase ABC score to equal to or greater than 72  to demonstrate statistically significant improvement in mobility and quality of life.  Baseline: 60 06/16/2023: 55% 07/26/23: 70.6%  Goal status: IN PROGRESS  2.  Patient (> 52 years old) will complete five times sit to stand test in < 15 seconds indicating an increased LE strength and improved balance. Baseline: 21.44 06/16/2023: 18.38 seconds no UE support 07/26/23: 15.55 seconds no UE support  Goal status: IN PROGRESS  3.  Patient will increase Berg Balance score by > 6 points to demonstrate decreased fall risk during functional activities Baseline: 1/30= 38/56 06/16/2023: 40/56 07/26/23: 42/56 Goal status: IN PROGRESS  4.  Patient will increase 10 meter walk test to >1.63m/s as to improve gait speed for better community ambulation and to reduce fall risk. Baseline: 0.83  m/s 06/16/2023: 0.83 m/s (12 seconds) without AD 07/26/23: 0.96 m/s with no AD  Goal status: IN PROGRESS  5.  Patient will reduce timed up and go to <11 seconds to reduce fall risk and demonstrate improved transfer/gait ability. Baseline: 14 sec  06/16/2023: 12.57 seconds no AD 07/26/23: 11.88 seconds no AD  Goal status: IN PROGRESS  6.  Patient will increase dynamic gait index score to >19/24 as to demonstrate reduced fall risk and improved dynamic gait balance for better safety with community/home ambulation.  Baseline: 05/12/23: 17 06/16/2023: 17/24 07/26/23: 18/24 Goal status: IN PROGRESS    ASSESSMENT:  CLINICAL IMPRESSION:    Patient was able to clear all step with resistance today and exhibits fatigue as limiting factor. He was unsteady with step ups and required UE Support yet able to complete all dynamic activities with minimal rest and only VC for correct technique overall. He was able to return demonstration of fall activities without evidence of motor planning issues today.  Pt will continue to benefit from skilled physical therapy intervention to address impairments, improve QOL, decrease fall risk, and attain therapy goals.    OBJECTIVE IMPAIRMENTS: Abnormal gait, cardiopulmonary status limiting activity, decreased activity tolerance, decreased balance, decreased coordination, decreased endurance, decreased knowledge of use of DME, decreased mobility, difficulty walking, decreased ROM, decreased strength, hypomobility, impaired perceived functional ability, impaired flexibility, impaired sensation, improper body mechanics, postural dysfunction, and obesity.   ACTIVITY LIMITATIONS: carrying, lifting, bending, standing, squatting, stairs, transfers, and locomotion level  PARTICIPATION LIMITATIONS: cleaning, laundry, community activity, and occupation  PERSONAL FACTORS: Age and 1-2 comorbidities: obesity CHF  are also affecting patient's functional outcome.   REHAB POTENTIAL:  Good  CLINICAL DECISION MAKING: Stable/uncomplicated  EVALUATION COMPLEXITY: Moderate  PLAN:  PT FREQUENCY: 1-2x/week  PT DURATION: 12 weeks  PLANNED INTERVENTIONS: 97110-Therapeutic exercises, 97530- Therapeutic activity, 97112- Neuromuscular re-education, 97535- Self Care, [id number]- Manual therapy, [phone]- Gait training, Patient/Family education, Balance training, Stair training, Taping, DME instructions, Cryotherapy, and Moist heat  PLAN FOR NEXT SESSION:  Dynamic balance interventions Fwd/back stepping over 1/2 foam roll vs hurdle with blaze pod dual-task  Static balance Tandem stance --> progress to eyes closed Dynamic gait training including: quick turning  stepping over obstacles agility ladder BLE strengthening    Ossie Blend, PT  Physical Therapist - [facility]  [facility]  10:58 PM 08/09/23

## 2023-08-11 ENCOUNTER — Ambulatory Visit: Payer: PPO | Admitting: Physical Therapy

## 2023-08-11 ENCOUNTER — Ambulatory Visit: Payer: PPO | Attending: Internal Medicine

## 2023-08-11 DIAGNOSIS — R262 Difficulty in walking, not elsewhere classified: Secondary | ICD-10-CM | POA: Diagnosis not present

## 2023-08-11 DIAGNOSIS — R2681 Unsteadiness on feet: Secondary | ICD-10-CM | POA: Diagnosis not present

## 2023-08-11 DIAGNOSIS — M6281 Muscle weakness (generalized): Secondary | ICD-10-CM | POA: Insufficient documentation

## 2023-08-11 DIAGNOSIS — R2689 Other abnormalities of gait and mobility: Secondary | ICD-10-CM | POA: Insufficient documentation

## 2023-08-11 NOTE — Therapy (Signed)
 OUTPATIENT PHYSICAL THERAPY NEURO TREATMENT   Patient Name: Anthony Weber MRN: [id number] DOB:07/02/1945, 78 y.o., male Today's Date: 08/12/2023   PCP: Westley Hammers, MD  REFERRING PROVIDER: Mardell Shade, MD   END OF SESSION:   PT End of Session - 08/11/23 1158     Visit Number 25    Number of Visits 46    Date for PT Re-Evaluation 10/25/23    Progress Note Due on Visit 10    PT Start Time 1148    PT Stop Time 1228    PT Time Calculation (min) 40 min    Equipment Utilized During Treatment Gait belt    Activity Tolerance Patient tolerated treatment well    Behavior During Therapy WFL for tasks assessed/performed                    Past Medical History:  Diagnosis Date   Depression    Hypertension    Hypothyroidism    Past Surgical History:  Procedure Laterality Date   COLONOSCOPY WITH PROPOFOL  N/A 03/03/2015   Procedure: COLONOSCOPY WITH PROPOFOL ;  Surgeon: Cassie Click, MD;  Location: [facility] ENDOSCOPY;  Service: Endoscopy;  Laterality: N/A;   EYE SURGERY     RIGHT HEART CATH N/A 08/08/2023   Procedure: RIGHT HEART CATH;  Surgeon: Mardell Shade, MD;  Location: [facility] INVASIVE CV LAB;  Service: Cardiovascular;  Laterality: N/A;   RIGHT/LEFT HEART CATH AND CORONARY ANGIOGRAPHY N/A 01/27/2021   Procedure: RIGHT/LEFT HEART CATH AND CORONARY ANGIOGRAPHY;  Surgeon: Sammy Crisp, MD;  Location: [facility] INVASIVE CV LAB;  Service: Cardiovascular;  Laterality: N/A;   TEE WITHOUT CARDIOVERSION N/A 02/05/2021   Procedure: TRANSESOPHAGEAL ECHOCARDIOGRAM (TEE);  Surgeon: Mardell Shade, MD;  Location: [facility] ENDOSCOPY;  Service: Cardiovascular;  Laterality: N/A;   Patient Active Problem List   Diagnosis Date Noted   Hyponatremia    Acute on chronic combined systolic and diastolic congestive heart failure (HCC)    Acute on chronic respiratory failure with hypoxia and hypercapnia (HCC)    MSSA bacteremia 02/03/2021   Central line infection     Pneumonia of both lower lobes due to methicillin susceptible Staphylococcus aureus (MSSA) (HCC)    Cardiogenic shock (HCC) 01/29/2021   Primary hypertension    Acute HFrEF (heart failure with reduced ejection fraction) (HCC)    Acute CHF (congestive heart failure) (HCC) 01/18/2021    ONSET DATE: 3 years.   REFERRING DIAG:  Diagnosis  R26.89 (ICD-10-CM) - Imbalance    THERAPY DIAG:  Unsteadiness on feet  Muscle weakness (generalized)  Difficulty in walking, not elsewhere classified  Balance disorder  Imbalance  Rationale for Evaluation and Treatment: Rehabilitation  SUBJECTIVE:  SUBJECTIVE STATEMENT:     Pt reports doing okay today- no complaint of pain or falls.  States compliant with HEP    Pt accompanied by: self  PERTINENT HISTORY:   From recent MD appointment. "Admitted to [facility] 01/2021  acute HFrEF. Echo  EF 25-30% from previous EF 60-65%. R/LHC w/ no CAD, moderately elevated left heart and pulmonary artery pressures, severely elevated right heart filling pressures, severely reduced Fick cardiac output/index.  Required intropes, pressor, and lasix  drip.  Transferred to [facility] for cardiogenic/septic shock, + MSSA bacteremia. Underwent TEE showing EF 25-30%, RV moderately down, severe biatrial enlargement and moderate MR, no valvular vegetation. Required addition of mexitiline to suppress PVCs. Hospitalization c/b AKI and hypoxic/hypercarbic respiratory failure.   Discharged from [facility] 03/11/21 after he completed IV antibiotics and rehab.    At last visit midodrine  stopped and GDMT started.    Sleep study 4/23 AHI 2   Here for routine f/u. Here for f/u with his wife. Says he is doing pretty well. 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 Walking 1 mile at the [place] almost everyday. And riding  stationary bike for 30 mins every night."  PAIN:  Are you having pain? No  PRECAUTIONS: None  RED FLAGS: None   WEIGHT BEARING RESTRICTIONS: No  FALLS: Has patient fallen in last 6 months? No  LIVING ENVIRONMENT: Lives with: lives with their spouse Lives in: House/apartment Stairs: Yes: Internal: 12 steps; on right going up and on left going up and External: 1 steps; on right going up and on left going up Has following equipment at home: Single point cane and does not use often    PLOF: Independent, Independent with basic ADLs, Independent with gait, and Independent with transfers  PATIENT GOALS: improve balance and strength in legs.   OBJECTIVE:  Note: Objective measures were completed at Evaluation unless otherwise noted.  DIAGNOSTIC FINDINGS:  None relevant   COGNITION: Overall cognitive status: Within functional limits for tasks assessed   SENSATION: Light touch: Impaired  and intermittent n/t in feet up to shins    COORDINATION: WFL   EDEMA:  Mild edema in bil LE   MUSCLE TONE: WFL   POSTURE: rounded shoulders and forward head  LOWER EXTREMITY ROM:     Grossly WFL  LOWER EXTREMITY MMT:    MMT Right Eval Left Eval  Hip flexion 4 4  Hip extension    Hip abduction 4+ 4+  Hip adduction 4 4  Hip internal rotation    Hip external rotation    Knee flexion 4+ 4+  Knee extension 4+ 4+  Ankle dorsiflexion 4+ 4+  Ankle plantarflexion    Ankle inversion    Ankle eversion    (Blank rows = not tested)  BED MOBILITY:  Sit to supine Complete Independence Supine to sit Complete Independence  TRANSFERS: Assistive device utilized: None  Sit to stand: Complete Independence Stand to sit: Complete Independence Chair to chair: Complete Independence Floor:  unable to complete .  Requires UE support topush from thighs to stand   RAMP:  Level of Assistance: Complete Independence Assistive device utilized:  Ramp Comments:   CURB:  Level of  Assistance: Modified independence Assistive device utilized:  rail Curb Comments:   STAIRS: Level of Assistance: SBA Stair Negotiation Technique: Step to Pattern Alternating Pattern  with Single Rail on Right Number of Stairs: 4  Height of Stairs: 6  Comments: step to on descent  GAIT: Gait pattern: lateral hip instability, trunk flexed,  and wide BOS Distance walked: 915 Assistive device utilized: None Level of assistance: Complete Independence Comments: increased flexed posture with increased distance  FUNCTIONAL TESTS:  5 times sit to stand: 19.11 with UE support; 21.44 with UE support on knees.  Timed up and go (TUG): 15.64, 13.74 sec  6 minute walk test: 959ft  10 meter walk test: 12.43 and 11.68 Berg Balance Scale: TBD Dynamic Gait Index: TBD  PATIENT SURVEYS:  ABC scale 60                                                                                                                              TREATMENT DATE: DATE: 08/12/23  Gait belt donned throughout for pt safety, CGA provided unless specified otherwise   NMR:   Dynamic High knee march (VC to perform slowly) - down/back x 8  Fwd walk in // bars - agility ladder- down and back x 6  Side stepping using agility ladder- in //bars down and back x 6  Dynamic walking in // bars- over obstacles (2) 1/2 foam rolls and 2 orange hurdles  Dynamic reaching forward (cone on top of 12" box) while single leg standing - alt LE x 15 reps   Therapeutic Activities: dynamic therapeutic activities  designed to achieve improved functional performance   Sit to stand without UE support- 11 reps holding onto 5kg orange ball     PATIENT EDUCATION: Education details: exercise technique, Person educated: Patient Education method: Archivist, Demo Education comprehension: verbalized understanding, returned demo  HOME EXERCISE PROGRAM:  Access Code: URL: [email or web address] Date:  06/23/2023 Prepared by: Carlen Chasten  Exercises - Doorway Pec Stretch at 60 Elevation  - 1 x daily - 7 x weekly - 2 sets - 1 minute hold - Standing Shoulder Row with Anchored Resistance  - 1 x daily - 7 x weekly - 3 sets - 10 reps - Low Trap Setting at Wall  - 1 x daily - 7 x weekly - 2 sets - 10 reps - Standing Tandem Balance with Counter Support  - 3 x weekly - 2 sets - 30 sec hold - Standing Near Stance in Corner with Eyes Closed  - 1 x daily - 7 x weekly - 2 sets - 30 seconds hold - Standing Single Leg Stance with Counter Support  - 3 x weekly - 3-5 sets - as long as possible hold - Standing March with Counter Support  - 3 x weekly - 3 sets - 10 reps - Side Stepping with Counter Support  - 1 x daily - 3 x weekly - 3 sets - 3 reps - Backward Walking with Counter Support  - 1 x daily - 7 x weekly - 3 sets - 5 reps   GOALS: Goals reviewed with patient? Yes  SHORT TERM GOALS: Target date: 09/13/2023    Patient will be independent in home exercise program to improve strength/mobility for better functional independence with ADLs. Baseline:  06/07/23: HEP provided  06/16/2023: pt reports consistent compliance with HEP 06/23/2023: updated HEP provided Goal status: IN PROGRESS   LONG TERM GOALS: Target date: 10/25/2023   Patient will increase ABC score to equal to or greater than 72  to demonstrate statistically significant improvement in mobility and quality of life.  Baseline: 60 06/16/2023: 55% 07/26/23: 70.6%  Goal status: IN PROGRESS  2.  Patient (> 58 years old) will complete five times sit to stand test in < 15 seconds indicating an increased LE strength and improved balance. Baseline: 21.44 06/16/2023: 18.38 seconds no UE support 07/26/23: 15.55 seconds no UE support  Goal status: IN PROGRESS  3.  Patient will increase Berg Balance score by > 6 points to demonstrate decreased fall risk during functional activities Baseline: 1/30= 38/56 06/16/2023: 40/56 07/26/23: 42/56 Goal status:  IN PROGRESS  4.  Patient will increase 10 meter walk test to >1.61m/s as to improve gait speed for better community ambulation and to reduce fall risk. Baseline: 0.83 m/s 06/16/2023: 0.83 m/s (12 seconds) without AD 07/26/23: 0.96 m/s with no AD  Goal status: IN PROGRESS  5.  Patient will reduce timed up and go to <11 seconds to reduce fall risk and demonstrate improved transfer/gait ability. Baseline: 14 sec  06/16/2023: 12.57 seconds no AD 07/26/23: 11.88 seconds no AD  Goal status: IN PROGRESS  6.  Patient will increase dynamic gait index score to >19/24 as to demonstrate reduced fall risk and improved dynamic gait balance for better safety with community/home ambulation.  Baseline: 05/12/23: 17 06/16/2023: 17/24 07/26/23: 18/24 Goal status: IN PROGRESS    ASSESSMENT:  CLINICAL IMPRESSION:    Treatment continued per recent plan focusing on more dynamic gait specifically exercises designed to pick his feet up. He responded well- some instability with balance with high knees or step overs but able to demonstrate some in session progress and improve with practice today. He was challenged with dynamic reaching - unable to complete without some UE support. Pt will continue to benefit from skilled physical therapy intervention to address impairments, improve QOL, decrease fall risk, and attain therapy goals.    OBJECTIVE IMPAIRMENTS: Abnormal gait, cardiopulmonary status limiting activity, decreased activity tolerance, decreased balance, decreased coordination, decreased endurance, decreased knowledge of use of DME, decreased mobility, difficulty walking, decreased ROM, decreased strength, hypomobility, impaired perceived functional ability, impaired flexibility, impaired sensation, improper body mechanics, postural dysfunction, and obesity.   ACTIVITY LIMITATIONS: carrying, lifting, bending, standing, squatting, stairs, transfers, and locomotion level  PARTICIPATION LIMITATIONS: cleaning, laundry,  community activity, and occupation  PERSONAL FACTORS: Age and 1-2 comorbidities: obesity CHF  are also affecting patient's functional outcome.   REHAB POTENTIAL: Good  CLINICAL DECISION MAKING: Stable/uncomplicated  EVALUATION COMPLEXITY: Moderate  PLAN:  PT FREQUENCY: 1-2x/week  PT DURATION: 12 weeks  PLANNED INTERVENTIONS: 97110-Therapeutic exercises, 97530- Therapeutic activity, 97112- Neuromuscular re-education, 97535- Self Care, 16109- Manual therapy, [phone]- Gait training, Patient/Family education, Balance training, Stair training, Taping, DME instructions, Cryotherapy, and Moist heat  PLAN FOR NEXT SESSION:  Dynamic balance interventions Fwd/back stepping over 1/2 foam roll vs hurdle with blaze pod dual-task  Static balance Tandem stance --> progress to eyes closed Dynamic gait training including: quick turning  stepping over obstacles agility ladder BLE strengthening    Ossie Blend, PT  Physical Therapist - [facility]  [facility]  10:13 AM 08/12/23

## 2023-08-15 ENCOUNTER — Other Ambulatory Visit: Payer: Self-pay

## 2023-08-15 ENCOUNTER — Telehealth: Payer: Self-pay | Admitting: Pharmacist

## 2023-08-15 NOTE — Telephone Encounter (Signed)
 Called to confirm/remind patient of their appointment at [facility] on 08/16/23.   Appointment:   [x] Confirmed  [] Left mess   [] No answer/No voice mail  [] VM Full/unable to leave message  [] Phone not in service  Patient reminded to bring all medications and/or complete list.  Confirmed patient has transportation. Gave directions, instructed to utilize valet parking.

## 2023-08-15 NOTE — Progress Notes (Unsigned)
 Patient ID: Anthony Weber                 DOB: Aug 23, 1945                    MRN: [id number]     HPI: [name] is a 78 y.o. male patient referred to pharmacy clinic by Dr. Julane Ny to initiate GLP1-RA therapy. PMH is significant for combined systolic/diastolic HF, HTN, HLD , PVCs, and obesity. Most recent BMI 38.68.  Baseline weight and BMI: 38.68 Current weight and BMI: 38.68 Current meds that affect weight: Previously    Diet: Patient report a heart healthy diet. Reviewed the plate method.  Exercise: Patient reports exercise with PT. Encouraged 150 minutes of moderate intensity exercise weekly.  Denies family history of medullary thyroid  cancer  Labs: Lab Results  Component Value Date   HGBA1C 4.8 08/01/2023    Wt Readings from Last 1 Encounters:  08/16/23 269 lb 9.6 oz (122.3 kg)    BP Readings from Last 1 Encounters:  08/08/23 119/67   Pulse Readings from Last 1 Encounters:  08/16/23 65       Component Value Date/Time   CHOL 137 08/11/2021 1154   TRIG 69 08/11/2021 1154   HDL 37 (L) 08/11/2021 1154   CHOLHDL 3.7 08/11/2021 1154   VLDL 14 08/11/2021 1154   LDLCALC 86 08/11/2021 1154    Past Medical History:  Diagnosis Date   Depression    Hypertension    Hypothyroidism     Current Outpatient Medications on File Prior to Visit  Medication Sig Dispense Refill   Ascorbic Acid (VITAMIN C) 1000 MG tablet Take 1,000 mg by mouth daily.     aspirin  325 MG tablet Take 325 mg by mouth daily.     Cholecalciferol (VITAMIN D3) 125 MCG (5000 UT) CAPS Take 1 capsule by mouth daily.     folic acid (FOLVITE) 400 MCG tablet Take 400 mcg by mouth daily.     levothyroxine  (SYNTHROID ) 75 MCG tablet Take 75 mcg by mouth daily before breakfast.     losartan  (COZAAR ) 25 MG tablet Take 0.5 tablets (12.5 mg total) by mouth daily. 90 tablet 3   metoprolol  succinate (TOPROL -XL) 25 MG 24 hr tablet Take 1 tablet (25 mg total) by mouth at bedtime with or immediately  following a meal. 90 tablet 1   mexiletine (MEXITIL ) 150 MG capsule Take 1 capsule (150 mg total) by mouth every 12 (twelve) hours. 60 capsule 0   pravastatin  (PRAVACHOL ) 20 MG tablet Take 1 tablet (20 mg total) by mouth daily. 30 tablet 11   vitamin B-12 (CYANOCOBALAMIN) 100 MCG tablet Take 100 mcg by mouth daily.     vitamin E 180 MG (400 UNITS) capsule Take 400 Units by mouth daily.     empagliflozin  (JARDIANCE ) 10 MG TABS tablet Take 1 tablet (10 mg total) by mouth daily before breakfast. (Patient not taking: Reported on 08/16/2023) 60 tablet 0   tirzepatide  (MOUNJARO ) 2.5 MG/0.5ML Pen Inject 2.5 mg into the skin once a week. 2 mL 0   torsemide  (DEMADEX ) 20 MG tablet Take 2 tablets (40 mg total) by mouth daily. 180 tablet 3   No current facility-administered medications on file prior to visit.    No Known Allergies   Assessment/Plan:  1. Weight loss - Patient has not met goal of at least 5% of body weight loss with comprehensive lifestyle modifications alone in the past 3-6 months. Pharmacotherapy is appropriate to pursue  as augmentation. Will start Mounjaro  2.5 mg weekly. Confirmed patient has no personal or family history of medullary thyroid  carcinoma (MTC) or Multiple Endocrine Neoplasia syndrome type 2 (MEN 2). Injection technique reviewed at today's visit.  Advised patient on common side effects including nausea, diarrhea, dyspepsia, decreased appetite, and fatigue. Counseled patient on reducing meal size and how to titrate medication to minimize side effects. Counseled patient to call if intolerable side effects or if experiencing dehydration, abdominal pain, or dizziness. Patient will adhere to dietary modifications and will target at least 150 minutes of moderate intensity exercise weekly.   Follow up in 1 month via telephone for tolerability update and dose titration.  Please do not hesitate to reach out with questions or concerns,  Bevely Brush, PharmD, CPP, BCPS Heart Failure  Pharmacist  Phone - [phone] 08/16/2023 4:08 PM

## 2023-08-16 ENCOUNTER — Ambulatory Visit: Payer: PPO | Admitting: Physical Therapy

## 2023-08-16 ENCOUNTER — Ambulatory Visit: Payer: PPO

## 2023-08-16 ENCOUNTER — Ambulatory Visit: Attending: Family | Admitting: Pharmacist

## 2023-08-16 ENCOUNTER — Other Ambulatory Visit: Payer: Self-pay

## 2023-08-16 VITALS — HR 65 | Wt 269.6 lb

## 2023-08-16 DIAGNOSIS — R262 Difficulty in walking, not elsewhere classified: Secondary | ICD-10-CM

## 2023-08-16 DIAGNOSIS — R2689 Other abnormalities of gait and mobility: Secondary | ICD-10-CM

## 2023-08-16 DIAGNOSIS — Z6838 Body mass index (BMI) 38.0-38.9, adult: Secondary | ICD-10-CM | POA: Insufficient documentation

## 2023-08-16 DIAGNOSIS — R634 Abnormal weight loss: Secondary | ICD-10-CM | POA: Diagnosis not present

## 2023-08-16 DIAGNOSIS — M6281 Muscle weakness (generalized): Secondary | ICD-10-CM

## 2023-08-16 DIAGNOSIS — R2681 Unsteadiness on feet: Secondary | ICD-10-CM

## 2023-08-16 DIAGNOSIS — I5042 Chronic combined systolic (congestive) and diastolic (congestive) heart failure: Secondary | ICD-10-CM

## 2023-08-16 NOTE — Patient Instructions (Signed)
 It was a pleasure seeing you today!  MEDICATIONS: -Start Mounjaro  2.5 mg injected into the subcutaneous tissue in your stomach or thigh once weekly -Call if you have questions about your medications.  LABS: -We will call you if your labs need attention.  In general, to take care of your heart failure: -Limit your fluid intake to 2 Liters (half-gallon) per day.   -Limit your salt intake to ideally 2-3 grams (2000-3000 mg) per day. -Weigh yourself daily and record, and bring that "weight diary" to your next appointment.  (Weight gain of 2-3 pounds in 1 day typically means fluid weight.) -The medications for your heart are to help your heart and help you live longer.   -Please contact us  before stopping any of your heart medications.  Call the clinic at [phone] with questions or to reschedule future appointments.

## 2023-08-16 NOTE — Therapy (Signed)
 OUTPATIENT PHYSICAL THERAPY NEURO TREATMENT   Patient Name: Anthony Weber MRN: [id number] DOB:[birth year], 78 y.o., male Today's Date: 08/16/2023   PCP: Westley Hammers, MD  REFERRING PROVIDER: Mardell Shade, MD   END OF SESSION:   PT End of Session - 08/16/23 1525     Visit Number 26    Number of Visits 46    Date for PT Re-Evaluation 10/25/23    Progress Note Due on Visit 10    PT Start Time 1525    PT Stop Time 1608    PT Time Calculation (min) 43 min    Equipment Utilized During Treatment Gait belt    Activity Tolerance Patient tolerated treatment well    Behavior During Therapy WFL for tasks assessed/performed                    Past Medical History:  Diagnosis Date   Depression    Hypertension    Hypothyroidism    Past Surgical History:  Procedure Laterality Date   COLONOSCOPY WITH PROPOFOL  N/A 03/03/2015   Procedure: COLONOSCOPY WITH PROPOFOL ;  Surgeon: Cassie Click, MD;  Location: [facility] ENDOSCOPY;  Service: Endoscopy;  Laterality: N/A;   EYE SURGERY     RIGHT HEART CATH N/A 08/08/2023   Procedure: RIGHT HEART CATH;  Surgeon: Mardell Shade, MD;  Location: [facility] INVASIVE CV LAB;  Service: Cardiovascular;  Laterality: N/A;   RIGHT/LEFT HEART CATH AND CORONARY ANGIOGRAPHY N/A 01/27/2021   Procedure: RIGHT/LEFT HEART CATH AND CORONARY ANGIOGRAPHY;  Surgeon: Sammy Crisp, MD;  Location: [facility] INVASIVE CV LAB;  Service: Cardiovascular;  Laterality: N/A;   TEE WITHOUT CARDIOVERSION N/A 02/05/2021   Procedure: TRANSESOPHAGEAL ECHOCARDIOGRAM (TEE);  Surgeon: Mardell Shade, MD;  Location: [facility] ENDOSCOPY;  Service: Cardiovascular;  Laterality: N/A;   Patient Active Problem List   Diagnosis Date Noted   Hyponatremia    Acute on chronic combined systolic and diastolic congestive heart failure (HCC)    Acute on chronic respiratory failure with hypoxia and hypercapnia (HCC)    MSSA bacteremia 02/03/2021   Central line infection     Pneumonia of both lower lobes due to methicillin susceptible Staphylococcus aureus (MSSA) (HCC)    Cardiogenic shock (HCC) 01/29/2021   Primary hypertension    Acute HFrEF (heart failure with reduced ejection fraction) (HCC)    Acute CHF (congestive heart failure) (HCC) 01/18/2021    ONSET DATE: 3 years.   REFERRING DIAG:  Diagnosis  R26.89 (ICD-10-CM) - Imbalance    THERAPY DIAG:  Unsteadiness on feet  Muscle weakness (generalized)  Difficulty in walking, not elsewhere classified  Balance disorder  Imbalance  Rationale for Evaluation and Treatment: Rehabilitation  SUBJECTIVE:  SUBJECTIVE STATEMENT:     Pt reports doing okay today- no complaint of pain or falls.  States compliant with HEP    Pt accompanied by: self  PERTINENT HISTORY:   From recent MD appointment. "Admitted to [facility] 01/2021  acute HFrEF. Echo  EF 25-30% from previous EF 60-65%. R/LHC w/ no CAD, moderately elevated left heart and pulmonary artery pressures, severely elevated right heart filling pressures, severely reduced Fick cardiac output/index.  Required intropes, pressor, and lasix  drip.  Transferred to [facility] for cardiogenic/septic shock, + MSSA bacteremia. Underwent TEE showing EF 25-30%, RV moderately down, severe biatrial enlargement and moderate MR, no valvular vegetation. Required addition of mexitiline to suppress PVCs. Hospitalization c/b AKI and hypoxic/hypercarbic respiratory failure.   Discharged from [facility] 03/11/21 after he completed IV antibiotics and rehab.    At last visit midodrine  stopped and GDMT started.    Sleep study 4/23 AHI 2   Here for routine f/u. Here for f/u with his wife. Says he is doing pretty well. 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 Walking 1 mile at the [place] almost everyday. And riding  stationary bike for 30 mins every night."  PAIN:  Are you having pain? No  PRECAUTIONS: None  RED FLAGS: None   WEIGHT BEARING RESTRICTIONS: No  FALLS: Has patient fallen in last 6 months? No  LIVING ENVIRONMENT: Lives with: lives with their spouse Lives in: House/apartment Stairs: Yes: Internal: 12 steps; on right going up and on left going up and External: 1 steps; on right going up and on left going up Has following equipment at home: Single point cane and does not use often    PLOF: Independent, Independent with basic ADLs, Independent with gait, and Independent with transfers  PATIENT GOALS: improve balance and strength in legs.   OBJECTIVE:  Note: Objective measures were completed at Evaluation unless otherwise noted.  DIAGNOSTIC FINDINGS:  None relevant   COGNITION: Overall cognitive status: Within functional limits for tasks assessed   SENSATION: Light touch: Impaired  and intermittent n/t in feet up to shins    COORDINATION: WFL   EDEMA:  Mild edema in bil LE   MUSCLE TONE: WFL   POSTURE: rounded shoulders and forward head  LOWER EXTREMITY ROM:     Grossly WFL  LOWER EXTREMITY MMT:    MMT Right Eval Left Eval  Hip flexion 4 4  Hip extension    Hip abduction 4+ 4+  Hip adduction 4 4  Hip internal rotation    Hip external rotation    Knee flexion 4+ 4+  Knee extension 4+ 4+  Ankle dorsiflexion 4+ 4+  Ankle plantarflexion    Ankle inversion    Ankle eversion    (Blank rows = not tested)  BED MOBILITY:  Sit to supine Complete Independence Supine to sit Complete Independence  TRANSFERS: Assistive device utilized: None  Sit to stand: Complete Independence Stand to sit: Complete Independence Chair to chair: Complete Independence Floor:  unable to complete .  Requires UE support topush from thighs to stand   RAMP:  Level of Assistance: Complete Independence Assistive device utilized:  Ramp Comments:   CURB:  Level of  Assistance: Modified independence Assistive device utilized:  rail Curb Comments:   STAIRS: Level of Assistance: SBA Stair Negotiation Technique: Step to Pattern Alternating Pattern  with Single Rail on Right Number of Stairs: 4  Height of Stairs: 6  Comments: step to on descent  GAIT: Gait pattern: lateral hip instability, trunk flexed,  and wide BOS Distance walked: 915 Assistive device utilized: None Level of assistance: Complete Independence Comments: increased flexed posture with increased distance  FUNCTIONAL TESTS:  5 times sit to stand: 19.11 with UE support; 21.44 with UE support on knees.  Timed up and go (TUG): 15.64, 13.74 sec  6 minute walk test: 942ft  10 meter walk test: 12.43 and 11.68 Berg Balance Scale: TBD Dynamic Gait Index: TBD  PATIENT SURVEYS:  ABC scale 60                                                                                                                              TREATMENT DATE: DATE: 08/16/23  Gait belt donned throughout for pt safety, CGA provided unless specified otherwise   NMR:   Dynamic step tap without UE support x 20 reps (VC to slow cadence)   Dynamic lateral Step up and down off 6 " block x 20 reps.   Dynamic High knee march (VC to perform slowly)  x 20 reps and 10 more from airex pad (very unsteady)   Tandem standing- hold up to 30 sec x 3 each LE (More difficulty with left LE in rear position)    Dynamic walking 10 feet distance (fwd and retro) x 8 with 4# AW- counting steps (5 forward avg and form 16 down to 9 steps backward with practice)          PATIENT EDUCATION: Education details: exercise technique, Person educated: Patient Education method: Archivist, Demo Education comprehension: verbalized understanding, returned demo  HOME EXERCISE PROGRAM:  Access Code: URL: [email or web address] Date: 06/23/2023 Prepared by: Carlen Chasten  Exercises - Doorway Pec Stretch at 60  Elevation  - 1 x daily - 7 x weekly - 2 sets - 1 minute hold - Standing Shoulder Row with Anchored Resistance  - 1 x daily - 7 x weekly - 3 sets - 10 reps - Low Trap Setting at Wall  - 1 x daily - 7 x weekly - 2 sets - 10 reps - Standing Tandem Balance with Counter Support  - 3 x weekly - 2 sets - 30 sec hold - Standing Near Stance in Corner with Eyes Closed  - 1 x daily - 7 x weekly - 2 sets - 30 seconds hold - Standing Single Leg Stance with Counter Support  - 3 x weekly - 3-5 sets - as long as possible hold - Standing March with Counter Support  - 3 x weekly - 3 sets - 10 reps - Side Stepping with Counter Support  - 1 x daily - 3 x weekly - 3 sets - 3 reps - Backward Walking with Counter Support  - 1 x daily - 7 x weekly - 3 sets - 5 reps   GOALS: Goals reviewed with patient? Yes  SHORT TERM GOALS: Target date: 09/13/2023    Patient will be independent in home exercise program to improve strength/mobility for better functional independence  with ADLs. Baseline: 06/07/23: HEP provided  06/16/2023: pt reports consistent compliance with HEP 06/23/2023: updated HEP provided Goal status: IN PROGRESS   LONG TERM GOALS: Target date: 10/25/2023   Patient will increase ABC score to equal to or greater than 72  to demonstrate statistically significant improvement in mobility and quality of life.  Baseline: 60 06/16/2023: 55% 07/26/23: 70.6%  Goal status: IN PROGRESS  2.  Patient (> 34 years old) will complete five times sit to stand test in < 15 seconds indicating an increased LE strength and improved balance. Baseline: 21.44 06/16/2023: 18.38 seconds no UE support 07/26/23: 15.55 seconds no UE support  Goal status: IN PROGRESS  3.  Patient will increase Berg Balance score by > 6 points to demonstrate decreased fall risk during functional activities Baseline: 1/30= 38/56 06/16/2023: 40/56 07/26/23: 42/56 Goal status: IN PROGRESS  4.  Patient will increase 10 meter walk test to >1.54m/s as to  improve gait speed for better community ambulation and to reduce fall risk. Baseline: 0.83 m/s 06/16/2023: 0.83 m/s (12 seconds) without AD 07/26/23: 0.96 m/s with no AD  Goal status: IN PROGRESS  5.  Patient will reduce timed up and go to <11 seconds to reduce fall risk and demonstrate improved transfer/gait ability. Baseline: 14 sec  06/16/2023: 12.57 seconds no AD 07/26/23: 11.88 seconds no AD  Goal status: IN PROGRESS  6.  Patient will increase dynamic gait index score to >19/24 as to demonstrate reduced fall risk and improved dynamic gait balance for better safety with community/home ambulation.  Baseline: 05/12/23: 17 06/16/2023: 17/24 07/26/23: 18/24 Goal status: IN PROGRESS    ASSESSMENT:  CLINICAL IMPRESSION:    Patient arrived with wife today and presents with good motivation. He performed well- eager to try to challenge himself today. Most difficulty with single limb standing activities- including airex and floor today. He did present with some in-session progress including improved step length with backward walking and lateral step ups  Pt will continue to benefit from skilled physical therapy intervention to address impairments, improve QOL, decrease fall risk, and attain therapy goals.    OBJECTIVE IMPAIRMENTS: Abnormal gait, cardiopulmonary status limiting activity, decreased activity tolerance, decreased balance, decreased coordination, decreased endurance, decreased knowledge of use of DME, decreased mobility, difficulty walking, decreased ROM, decreased strength, hypomobility, impaired perceived functional ability, impaired flexibility, impaired sensation, improper body mechanics, postural dysfunction, and obesity.   ACTIVITY LIMITATIONS: carrying, lifting, bending, standing, squatting, stairs, transfers, and locomotion level  PARTICIPATION LIMITATIONS: cleaning, laundry, community activity, and occupation  PERSONAL FACTORS: Age and 1-2 comorbidities: obesity CHF  are also  affecting patient's functional outcome.   REHAB POTENTIAL: Good  CLINICAL DECISION MAKING: Stable/uncomplicated  EVALUATION COMPLEXITY: Moderate  PLAN:  PT FREQUENCY: 1-2x/week  PT DURATION: 12 weeks  PLANNED INTERVENTIONS: 97110-Therapeutic exercises, 97530- Therapeutic activity, 97112- Neuromuscular re-education, 97535- Self Care, [id number]- Manual therapy, [phone]- Gait training, Patient/Family education, Balance training, Stair training, Taping, DME instructions, Cryotherapy, and Moist heat  PLAN FOR NEXT SESSION:  Dynamic balance interventions Fwd/back stepping over 1/2 foam roll vs hurdle with blaze pod dual-task  Static balance Tandem stance --> progress to eyes closed Dynamic gait training including: quick turning  stepping over obstacles agility ladder BLE strengthening    Ossie Blend, PT  Physical Therapist - [facility]  [facility]  5:19 PM 08/16/23

## 2023-08-18 ENCOUNTER — Ambulatory Visit: Payer: PPO

## 2023-08-18 ENCOUNTER — Ambulatory Visit: Payer: PPO | Admitting: Physical Therapy

## 2023-08-20 NOTE — H&P (Signed)
 [facility]  NOTE  Primary Care: Anthony Hammers, MD Primary HF Cardiologist: Dr. Julane Ny  HPI: [name] is a 78 y.o. with history includes combined systolic/diastolic HF, HTN, HLD , PVCs, and obesity. Had Echo 04/18/20  LVEF 60-65% w/ G2DD and normal RV.   Admitted to [facility] 10/22  acute HFrEF. Echo  EF 25-30% from previous EF 60-65%. R/LHC w/ no CAD, moderately elevated left heart and pulmonary artery pressures, severely elevated right heart filling pressures, severely reduced Fick cardiac output/index.  Required intropes, pressor, and lasix  drip.  Transferred to [facility] for cardiogenic/septic shock, + MSSA bacteremia. Underwent TEE showing EF 25-30%, RV moderately down, severe biatrial enlargement and moderate MR, no valvular vegetation. Required addition of mexitiline to suppress PVCs. Hospitalization c/b AKI and hypoxic/hypercarbic respiratory failure.  Discharged from [facility] 03/11/21 after he completed IV antibiotics and rehab.   At last visit midodrine  stopped and GDMT started.   Sleep study 4/23 AHI 2  Seen recently in clinic for routine f/u.Symptoms worse. Scheduled for RHC.   Presents today for RHC  Echo 07/19/23 EF 25-30% RV ok Personally reviewed  cMRI 5/24 EF 31% no LGE or infiltrative process. RVEF 43% Zio 4/24 SR - average rate 66. Nine runs NSVT  3.1% PVCs Echo  9/23 EF 35-40% moderate MR.  Echo 07/27/22 EF 30-35% moderate MR  Cardiac Studies Echo 2/23 EF 30%  RV normal.  Echo 04/2020 EF 60-65%.   LHC/RHC 01/2021  no CAD  RA 20 PA 48/29 (35)  PCWP 25 CO 3.7 CI 1.4  Past Medical History:  Diagnosis Date   Depression    Hypertension    Hypothyroidism    No current facility-administered medications for this encounter.   Current Outpatient Medications  Medication Sig Dispense Refill   Ascorbic Acid (VITAMIN C) 1000 MG tablet Take 1,000 mg by mouth daily.     aspirin  325 MG tablet Take 325 mg by mouth daily.     Cholecalciferol (VITAMIN D3) 125 MCG (5000 UT)  CAPS Take 1 capsule by mouth daily.     empagliflozin  (JARDIANCE ) 10 MG TABS tablet Take 1 tablet (10 mg total) by mouth daily before breakfast. (Patient not taking: Reported on 08/16/2023) 60 tablet 0   folic acid (FOLVITE) 400 MCG tablet Take 400 mcg by mouth daily.     levothyroxine  (SYNTHROID ) 75 MCG tablet Take 75 mcg by mouth daily before breakfast.     losartan  (COZAAR ) 25 MG tablet Take 0.5 tablets (12.5 mg total) by mouth daily. 90 tablet 3   metoprolol  succinate (TOPROL -XL) 25 MG 24 hr tablet Take 1 tablet (25 mg total) by mouth at bedtime with or immediately following a meal. 90 tablet 1   mexiletine (MEXITIL ) 150 MG capsule Take 1 capsule (150 mg total) by mouth every 12 (twelve) hours. 60 capsule 0   pravastatin  (PRAVACHOL ) 20 MG tablet Take 1 tablet (20 mg total) by mouth daily. 30 tablet 11   torsemide  (DEMADEX ) 20 MG tablet Take 2 tablets (40 mg total) by mouth daily. 180 tablet 3   vitamin B-12 (CYANOCOBALAMIN) 100 MCG tablet Take 100 mcg by mouth daily.     vitamin E 180 MG (400 UNITS) capsule Take 400 Units by mouth daily.     tirzepatide  (MOUNJARO ) 2.5 MG/0.5ML Pen Inject 2.5 mg into the skin once a week. 2 mL 0   No Known Allergies  Social History   Socioeconomic History   Marital status: Married    Spouse name: Anthony Weber  Number of children: Not on file   Years of education: Not on file   Highest education level: Not on file  Occupational History   Not on file  Tobacco Use   Smoking status: Former   Smokeless tobacco: Former    Types: Designer, multimedia Use   Vaping status: Unknown  Substance and Sexual Activity   Alcohol use: No   Drug use: Not on file   Sexual activity: Not on file  Other Topics Concern   Not on file  Social History Narrative   Not on file   Social Drivers of Health   Financial Resource Strain: Not on file  Food Insecurity: Not on file  Transportation Needs: Not on file  Physical Activity: Not on file  Stress: Not on file  Social  Connections: Not on file  Intimate Partner Violence: Not on file   No family history on file. Vitals:   08/08/23 0850 08/08/23 0915  BP: 112/65 119/67  Pulse: (!) 55   Resp:    Temp:    SpO2: 97%      BP 119/67   Pulse (!) 55   Temp 97.8 F (36.6 C) (Oral)   Resp 16   Ht 5\' 10"  (1.778 m)   Wt 116.1 kg   SpO2 97%   BMI 36.73 kg/m   Wt Readings from Last 3 Encounters:  08/16/23 122.3 kg  08/08/23 116.1 kg  08/01/23 121.1 kg   PHYSICAL EXAM: General:  Elderly obese fatigued. No resp difficulty HEENT: normal Neck: supple. no JVD. Carotids 2+ bilat; no bruits. No lymphadenopathy or thryomegaly appreciated. Cor: PMI nondisplaced. Regular rate & rhythm. No rubs, gallops or murmurs. Lungs: clear Abdomen: obese soft, nontender, nondistended. No hepatosplenomegaly. No bruits or masses. Good bowel sounds. Extremities: no cyanosis, clubbing, rash, 1+ edema Neuro: alert & orientedx3, cranial nerves grossly intact. moves all 4 extremities w/o difficulty. Affect pleasant   ASSESSMENT & PLAN:   1. Chronic Systolic Heart Failure  - Echo (10/22): EF down from 55-->20-25%.  - R/LHC (10/22): with no CAD, elevated filling pressures and low output heart failure.  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 Sleep study 3/23 negative (AHI 2) - Echo 05/14/21 EF 30% RV normal  - Echo 12/17/21 EF 35-40% moderate MR.  - Echo 07/27/22: EF 30-35% moderate MR - cMRI 5/24 EF 31% no LGE or infiltrative process. RVEF 43% - Zio 4/24 SR - average rate 66. Nine runs NSVT  3.1% PVCs - He is worse today with progressive exertional fatigue - Now NYHA III-IIIB - Volume status looks elevated on exam but ReDS only 31% (? R > L heart failure) - Echo 07/19/23 EF 25-30% RV ok Personally reviewed - Increase torsemide  20 -> 40 daily. - Continue jardiance  10 mg daily - Continue spironolactone  12.5 mg daily - Continue losartan  12.5  - Continue Toprol  25 unable  to tolerate higher - EF essentially unchanged despite PVC suppression. cMRI confirms NICM. Doubt LBBB wide enough to cause CM.  - Remains uninterested in ICD - Clinically much worse over last few weeks. Will proceed with RHC to further evalaute hemodynamics.   2. NSVT, PVC  - Remains in NSR.  PVCs suppressed - Zio 4/24 SR - average rate 66. Nine runs NSVT  3.1% PVCs - Continue mexilitene 150 bid - Sleep study negative 3/23 - Continue current therapy  3. Morbid Obesity  - Body mass index is 36.73 kg/m. - Continue  weight loss efforts. - Has refused GLP1RA in past. We discussed this at length againt today. - Referred to HF PharmD  4. LBBB - plan as above - QRS 136 -  doubt this causing CM  5. Poor balance - Continue PT/OT eval  Patient presents today for RHC.   Jules Oar MD 3:38 PM

## 2023-08-23 ENCOUNTER — Other Ambulatory Visit (HOSPITAL_COMMUNITY): Payer: Self-pay

## 2023-08-23 ENCOUNTER — Ambulatory Visit: Payer: PPO

## 2023-08-23 ENCOUNTER — Ambulatory Visit: Payer: PPO | Admitting: Physical Therapy

## 2023-08-23 DIAGNOSIS — R2681 Unsteadiness on feet: Secondary | ICD-10-CM

## 2023-08-23 DIAGNOSIS — M6281 Muscle weakness (generalized): Secondary | ICD-10-CM

## 2023-08-23 DIAGNOSIS — R262 Difficulty in walking, not elsewhere classified: Secondary | ICD-10-CM

## 2023-08-23 DIAGNOSIS — R2689 Other abnormalities of gait and mobility: Secondary | ICD-10-CM

## 2023-08-23 NOTE — Therapy (Signed)
 OUTPATIENT PHYSICAL THERAPY NEURO TREATMENT   Patient Name: Anthony Weber MRN: [id number] DOB:Oct 28, 1945, 78 y.o., male Today's Date: 08/24/2023   PCP: Westley Hammers, MD  REFERRING PROVIDER: Mardell Shade, MD   END OF SESSION:   PT End of Session - 08/23/23 1452     Visit Number 27    Number of Visits 46    Date for PT Re-Evaluation 10/25/23    Progress Note Due on Visit 10    PT Start Time 1450    PT Stop Time 1530    PT Time Calculation (min) 40 min    Equipment Utilized During Treatment Gait belt    Activity Tolerance Patient tolerated treatment well    Behavior During Therapy WFL for tasks assessed/performed                    Past Medical History:  Diagnosis Date   Depression    Hypertension    Hypothyroidism    Past Surgical History:  Procedure Laterality Date   COLONOSCOPY WITH PROPOFOL  N/A 03/03/2015   Procedure: COLONOSCOPY WITH PROPOFOL ;  Surgeon: Cassie Click, MD;  Location: [facility] ENDOSCOPY;  Service: Endoscopy;  Laterality: N/A;   EYE SURGERY     RIGHT HEART CATH N/A 08/08/2023   Procedure: RIGHT HEART CATH;  Surgeon: Mardell Shade, MD;  Location: [facility] INVASIVE CV LAB;  Service: Cardiovascular;  Laterality: N/A;   RIGHT/LEFT HEART CATH AND CORONARY ANGIOGRAPHY N/A 01/27/2021   Procedure: RIGHT/LEFT HEART CATH AND CORONARY ANGIOGRAPHY;  Surgeon: Sammy Crisp, MD;  Location: [facility] INVASIVE CV LAB;  Service: Cardiovascular;  Laterality: N/A;   TEE WITHOUT CARDIOVERSION N/A 02/05/2021   Procedure: TRANSESOPHAGEAL ECHOCARDIOGRAM (TEE);  Surgeon: Mardell Shade, MD;  Location: [facility] ENDOSCOPY;  Service: Cardiovascular;  Laterality: N/A;   Patient Active Problem List   Diagnosis Date Noted   Hyponatremia    Acute on chronic combined systolic and diastolic congestive heart failure (HCC)    Acute on chronic respiratory failure with hypoxia and hypercapnia (HCC)    MSSA bacteremia 02/03/2021   Central line infection     Pneumonia of both lower lobes due to methicillin susceptible Staphylococcus aureus (MSSA) (HCC)    Cardiogenic shock (HCC) 01/29/2021   Primary hypertension    Acute HFrEF (heart failure with reduced ejection fraction) (HCC)    Acute CHF (congestive heart failure) (HCC) 01/18/2021    ONSET DATE: 3 years.   REFERRING DIAG:  Diagnosis  R26.89 (ICD-10-CM) - Imbalance    THERAPY DIAG:  Unsteadiness on feet  Muscle weakness (generalized)  Difficulty in walking, not elsewhere classified  Balance disorder  Imbalance  Rationale for Evaluation and Treatment: Rehabilitation  SUBJECTIVE:  SUBJECTIVE STATEMENT:     Pt reports feeling okay- arms recovering- feeling better overall     States compliant with HEP    Pt accompanied by: self  PERTINENT HISTORY:   From recent MD appointment. "Admitted to [facility] 01/2021  acute HFrEF. Echo  EF 25-30% from previous EF 60-65%. R/LHC w/ no CAD, moderately elevated left heart and pulmonary artery pressures, severely elevated right heart filling pressures, severely reduced Fick cardiac output/index.  Required intropes, pressor, and lasix  drip.  Transferred to [facility] for cardiogenic/septic shock, + MSSA bacteremia. Underwent TEE showing EF 25-30%, RV moderately down, severe biatrial enlargement and moderate MR, no valvular vegetation. Required addition of mexitiline to suppress PVCs. Hospitalization c/b AKI and hypoxic/hypercarbic respiratory failure.   Discharged from [facility] 03/11/21 after he completed IV antibiotics and rehab.    At last visit midodrine  stopped and GDMT started.    Sleep study 4/23 AHI 2   Here for routine f/u. Here for f/u with his wife. Says he is doing pretty well. 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 Walking 1 mile at the [place] almost everyday.  And riding stationary bike for 30 mins every night."  PAIN:  Are you having pain? No  PRECAUTIONS: None  RED FLAGS: None   WEIGHT BEARING RESTRICTIONS: No  FALLS: Has patient fallen in last 6 months? No  LIVING ENVIRONMENT: Lives with: lives with their spouse Lives in: House/apartment Stairs: Yes: Internal: 12 steps; on right going up and on left going up and External: 1 steps; on right going up and on left going up Has following equipment at home: Single point cane and does not use often    PLOF: Independent, Independent with basic ADLs, Independent with gait, and Independent with transfers  PATIENT GOALS: improve balance and strength in legs.   OBJECTIVE:  Note: Objective measures were completed at Evaluation unless otherwise noted.  DIAGNOSTIC FINDINGS:  None relevant   COGNITION: Overall cognitive status: Within functional limits for tasks assessed   SENSATION: Light touch: Impaired  and intermittent n/t in feet up to shins    COORDINATION: WFL   EDEMA:  Mild edema in bil LE   MUSCLE TONE: WFL   POSTURE: rounded shoulders and forward head  LOWER EXTREMITY ROM:     Grossly WFL  LOWER EXTREMITY MMT:    MMT Right Eval Left Eval  Hip flexion 4 4  Hip extension    Hip abduction 4+ 4+  Hip adduction 4 4  Hip internal rotation    Hip external rotation    Knee flexion 4+ 4+  Knee extension 4+ 4+  Ankle dorsiflexion 4+ 4+  Ankle plantarflexion    Ankle inversion    Ankle eversion    (Blank rows = not tested)  BED MOBILITY:  Sit to supine Complete Independence Supine to sit Complete Independence  TRANSFERS: Assistive device utilized: None  Sit to stand: Complete Independence Stand to sit: Complete Independence Chair to chair: Complete Independence Floor: unable to complete .  Requires UE support topush from thighs to stand   RAMP:  Level of Assistance: Complete Independence Assistive device utilized:  Ramp Comments:   CURB:  Level of  Assistance: Modified independence Assistive device utilized: rail Curb Comments:   STAIRS: Level of Assistance: SBA Stair Negotiation Technique: Step to Pattern Alternating Pattern  with Single Rail on Right Number of Stairs: 4  Height of Stairs: 6  Comments: step to on descent  GAIT: Gait pattern: lateral hip instability, trunk flexed, and  wide BOS Distance walked: 915 Assistive device utilized: None Level of assistance: Complete Independence Comments: increased flexed posture with increased distance  FUNCTIONAL TESTS:  5 times sit to stand: 19.11 with UE support; 21.44 with UE support on knees.  Timed up and go (TUG): 15.64, 13.74 sec  6 minute walk test: 926ft  10 meter walk test: 12.43 and 11.68 Berg Balance Scale: TBD Dynamic Gait Index: TBD  PATIENT SURVEYS:  ABC scale 60                                                                                                                              TREATMENT DATE: DATE: 08/23/23  Gait belt donned throughout for pt safety, CGA provided unless specified otherwise   NMR:   Dynamic walking in // bars (over obstacles - 2 canes, half foam, and orange hurdle) down and back x 10.   Dynamic step tap without UE support x 20 reps (VC to slow cadence)  Dynamic step tap (starting on airex pad and tap onto 6" block)    Dynamic lateral Step up and down off 6 " block x 20 reps.   Dynamic High knee march (VC to perform slowly)  x 20 reps and 10 more from airex pad (very unsteady)   Tandem standing- hold up to 30 sec x 3 each LE (More difficulty with left LE in rear position)    Dynamic walking 10 feet distance (fwd and retro) x 8 with VC to keep feet wide (tendency to narrow step back)      TA:   SIT TO STAND from EOM: 24in x 5; 23 in x 5; 22 in x 5- all without UE support.     PATIENT EDUCATION: Education details: exercise technique, Person educated: Patient Education method: Archivist, Demo Education  comprehension: verbalized understanding, returned demo  HOME EXERCISE PROGRAM:  Access Code: URL: [email or web address] Date: 06/23/2023 Prepared by: Carlen Chasten  Exercises - Doorway Pec Stretch at 60 Elevation  - 1 x daily - 7 x weekly - 2 sets - 1 minute hold - Standing Shoulder Row with Anchored Resistance  - 1 x daily - 7 x weekly - 3 sets - 10 reps - Low Trap Setting at Wall  - 1 x daily - 7 x weekly - 2 sets - 10 reps - Standing Tandem Balance with Counter Support  - 3 x weekly - 2 sets - 30 sec hold - Standing Near Stance in Corner with Eyes Closed  - 1 x daily - 7 x weekly - 2 sets - 30 seconds hold - Standing Single Leg Stance with Counter Support  - 3 x weekly - 3-5 sets - as long as possible hold - Standing March with Counter Support  - 3 x weekly - 3 sets - 10 reps - Side Stepping with Counter Support  - 1 x daily - 3 x weekly - 3 sets - 3 reps - Backward Walking  with Counter Support  - 1 x daily - 7 x weekly - 3 sets - 5 reps   GOALS: Goals reviewed with patient? Yes  SHORT TERM GOALS: Target date: 09/13/2023    Patient will be independent in home exercise program to improve strength/mobility for better functional independence with ADLs. Baseline: 06/07/23: HEP provided  06/16/2023: pt reports consistent compliance with HEP 06/23/2023: updated HEP provided Goal status: IN PROGRESS   LONG TERM GOALS: Target date: 10/25/2023   Patient will increase ABC score to equal to or greater than 72  to demonstrate statistically significant improvement in mobility and quality of life.  Baseline: 60 06/16/2023: 55% 07/26/23: 70.6%  Goal status: IN PROGRESS  2.  Patient (> 59 years old) will complete five times sit to stand test in < 15 seconds indicating an increased LE strength and improved balance. Baseline: 21.44 06/16/2023: 18.38 seconds no UE support 07/26/23: 15.55 seconds no UE support  Goal status: IN PROGRESS  3.  Patient will increase Berg Balance  score by > 6 points to demonstrate decreased fall risk during functional activities Baseline: 1/30= 38/56 06/16/2023: 40/56 07/26/23: 42/56 Goal status: IN PROGRESS  4.  Patient will increase 10 meter walk test to >1.71m/s as to improve gait speed for better community ambulation and to reduce fall risk. Baseline: 0.83 m/s 06/16/2023: 0.83 m/s (12 seconds) without AD 07/26/23: 0.96 m/s with no AD  Goal status: IN PROGRESS  5.  Patient will reduce timed up and go to <11 seconds to reduce fall risk and demonstrate improved transfer/gait ability. Baseline: 14 sec  06/16/2023: 12.57 seconds no AD 07/26/23: 11.88 seconds no AD  Goal status: IN PROGRESS  6.  Patient will increase dynamic gait index score to >19/24 as to demonstrate reduced fall risk and improved dynamic gait balance for better safety with community/home ambulation.  Baseline: 05/12/23: 17 06/16/2023: 17/24 07/26/23: 18/24 Goal status: IN PROGRESS    ASSESSMENT:  CLINICAL IMPRESSION:    Patient continues to present with good motivation for today's treatment. Treatment again focused on stepping over obstacles and maintaining his balance which initially he struggled with yet improved with practice. He was able to maintain tandem standing and less VC for step length with retro walking and improved steadiness overall vs. Previous treatment.   Pt will continue to benefit from skilled physical therapy intervention to address impairments, improve QOL, decrease fall risk, and attain therapy goals.    OBJECTIVE IMPAIRMENTS: Abnormal gait, cardiopulmonary status limiting activity, decreased activity tolerance, decreased balance, decreased coordination, decreased endurance, decreased knowledge of use of DME, decreased mobility, difficulty walking, decreased ROM, decreased strength, hypomobility, impaired perceived functional ability, impaired flexibility, impaired sensation, improper body mechanics, postural dysfunction, and obesity.   ACTIVITY  LIMITATIONS: carrying, lifting, bending, standing, squatting, stairs, transfers, and locomotion level  PARTICIPATION LIMITATIONS: cleaning, laundry, community activity, and occupation  PERSONAL FACTORS: Age and 1-2 comorbidities: obesity CHF are also affecting patient's functional outcome.   REHAB POTENTIAL: Good  CLINICAL DECISION MAKING: Stable/uncomplicated  EVALUATION COMPLEXITY: Moderate  PLAN:  PT FREQUENCY: 1-2x/week  PT DURATION: 12 weeks  PLANNED INTERVENTIONS: 97110-Therapeutic exercises, 97530- Therapeutic activity, 97112- Neuromuscular re-education, 97535- Self Care, 16109- Manual therapy, [phone]- Gait training, Patient/Family education, Balance training, Stair training, Taping, DME instructions, Cryotherapy, and Moist heat  PLAN FOR NEXT SESSION:  Dynamic balance interventions Fwd/back stepping over 1/2 foam roll vs hurdle with blaze pod dual-task  Static balance Tandem stance --> progress to eyes closed Dynamic gait training including: quick turning  stepping over obstacles agility ladder BLE strengthening    Ossie Blend, PT  Physical Therapist - [facility]   [facility]  1:34 PM 08/24/23

## 2023-08-25 ENCOUNTER — Ambulatory Visit: Payer: PPO | Admitting: Physical Therapy

## 2023-08-25 ENCOUNTER — Ambulatory Visit: Payer: PPO

## 2023-08-25 DIAGNOSIS — R2681 Unsteadiness on feet: Secondary | ICD-10-CM | POA: Diagnosis not present

## 2023-08-25 DIAGNOSIS — M6281 Muscle weakness (generalized): Secondary | ICD-10-CM

## 2023-08-25 DIAGNOSIS — R262 Difficulty in walking, not elsewhere classified: Secondary | ICD-10-CM

## 2023-08-25 DIAGNOSIS — R2689 Other abnormalities of gait and mobility: Secondary | ICD-10-CM

## 2023-08-25 NOTE — Therapy (Signed)
 OUTPATIENT PHYSICAL THERAPY NEURO TREATMENT   Patient Name: Diontae Zeller MRN: [id number] DOB:12-Mar-1946, 78 y.o., male Today's Date: 08/26/2023   PCP: Westley Hammers, MD  REFERRING PROVIDER: Mardell Shade, MD   END OF SESSION:   PT End of Session - 08/25/23 1150     Visit Number 28    Number of Visits 46    Date for PT Re-Evaluation 10/25/23    Progress Note Due on Visit 10    PT Start Time 1146    PT Stop Time 1230    PT Time Calculation (min) 44 min    Equipment Utilized During Treatment Gait belt    Activity Tolerance Patient tolerated treatment well    Behavior During Therapy WFL for tasks assessed/performed                    Past Medical History:  Diagnosis Date   Depression    Hypertension    Hypothyroidism    Past Surgical History:  Procedure Laterality Date   COLONOSCOPY WITH PROPOFOL  N/A 03/03/2015   Procedure: COLONOSCOPY WITH PROPOFOL ;  Surgeon: Cassie Click, MD;  Location: [facility] ENDOSCOPY;  Service: Endoscopy;  Laterality: N/A;   EYE SURGERY     RIGHT HEART CATH N/A 08/08/2023   Procedure: RIGHT HEART CATH;  Surgeon: Mardell Shade, MD;  Location: [facility] INVASIVE CV LAB;  Service: Cardiovascular;  Laterality: N/A;   RIGHT/LEFT HEART CATH AND CORONARY ANGIOGRAPHY N/A 01/27/2021   Procedure: RIGHT/LEFT HEART CATH AND CORONARY ANGIOGRAPHY;  Surgeon: Sammy Crisp, MD;  Location: [facility] INVASIVE CV LAB;  Service: Cardiovascular;  Laterality: N/A;   TEE WITHOUT CARDIOVERSION N/A 02/05/2021   Procedure: TRANSESOPHAGEAL ECHOCARDIOGRAM (TEE);  Surgeon: Mardell Shade, MD;  Location: [facility] ENDOSCOPY;  Service: Cardiovascular;  Laterality: N/A;   Patient Active Problem List   Diagnosis Date Noted   Hyponatremia    Acute on chronic combined systolic and diastolic congestive heart failure (HCC)    Acute on chronic respiratory failure with hypoxia and hypercapnia (HCC)    MSSA bacteremia 02/03/2021   Central line infection     Pneumonia of both lower lobes due to methicillin susceptible Staphylococcus aureus (MSSA) (HCC)    Cardiogenic shock (HCC) 01/29/2021   Primary hypertension    Acute HFrEF (heart failure with reduced ejection fraction) (HCC)    Acute CHF (congestive heart failure) (HCC) 01/18/2021    ONSET DATE: 3 years.   REFERRING DIAG:  Diagnosis  R26.89 (ICD-10-CM) - Imbalance    THERAPY DIAG:  Unsteadiness on feet  Muscle weakness (generalized)  Difficulty in walking, not elsewhere classified  Balance disorder  Imbalance  Rationale for Evaluation and Treatment: Rehabilitation  SUBJECTIVE:  SUBJECTIVE STATEMENT:     Pt reports feeling about the same. Moving around best I can.    States compliant with HEP    Pt accompanied by: self  PERTINENT HISTORY:   From recent MD appointment. "Admitted to [facility] 01/2021  acute HFrEF. Echo  EF 25-30% from previous EF 60-65%. R/LHC w/ no CAD, moderately elevated left heart and pulmonary artery pressures, severely elevated right heart filling pressures, severely reduced Fick cardiac output/index.  Required intropes, pressor, and lasix  drip.  Transferred to [facility] for cardiogenic/septic shock, + MSSA bacteremia. Underwent TEE showing EF 25-30%, RV moderately down, severe biatrial enlargement and moderate MR, no valvular vegetation. Required addition of mexitiline to suppress PVCs. Hospitalization c/b AKI and hypoxic/hypercarbic respiratory failure.   Discharged from [facility] 03/11/21 after he completed IV antibiotics and rehab.    At last visit midodrine  stopped and GDMT started.    Sleep study 4/23 AHI 2   Here for routine f/u. Here for f/u with his wife. Says he is doing pretty well. 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 Walking 1 mile at the [place] almost everyday. And  riding stationary bike for 30 mins every night."  PAIN:  Are you having pain? No  PRECAUTIONS: None  RED FLAGS: None   WEIGHT BEARING RESTRICTIONS: No  FALLS: Has patient fallen in last 6 months? No  LIVING ENVIRONMENT: Lives with: lives with their spouse Lives in: House/apartment Stairs: Yes: Internal: 12 steps; on right going up and on left going up and External: 1 steps; on right going up and on left going up Has following equipment at home: Single point cane and does not use often    PLOF: Independent, Independent with basic ADLs, Independent with gait, and Independent with transfers  PATIENT GOALS: improve balance and strength in legs.   OBJECTIVE:  Note: Objective measures were completed at Evaluation unless otherwise noted.  DIAGNOSTIC FINDINGS:  None relevant   COGNITION: Overall cognitive status: Within functional limits for tasks assessed   SENSATION: Light touch: Impaired  and intermittent n/t in feet up to shins    COORDINATION: WFL   EDEMA:  Mild edema in bil LE   MUSCLE TONE: WFL   POSTURE: rounded shoulders and forward head  LOWER EXTREMITY ROM:     Grossly WFL  LOWER EXTREMITY MMT:    MMT Right Eval Left Eval  Hip flexion 4 4  Hip extension    Hip abduction 4+ 4+  Hip adduction 4 4  Hip internal rotation    Hip external rotation    Knee flexion 4+ 4+  Knee extension 4+ 4+  Ankle dorsiflexion 4+ 4+  Ankle plantarflexion    Ankle inversion    Ankle eversion    (Blank rows = not tested)  BED MOBILITY:  Sit to supine Complete Independence Supine to sit Complete Independence  TRANSFERS: Assistive device utilized: None  Sit to stand: Complete Independence Stand to sit: Complete Independence Chair to chair: Complete Independence Floor: unable to complete .  Requires UE support topush from thighs to stand   RAMP:  Level of Assistance: Complete Independence Assistive device utilized:  Ramp Comments:   CURB:  Level of  Assistance: Modified independence Assistive device utilized: rail Curb Comments:   STAIRS: Level of Assistance: SBA Stair Negotiation Technique: Step to Pattern Alternating Pattern  with Single Rail on Right Number of Stairs: 4  Height of Stairs: 6  Comments: step to on descent  GAIT: Gait pattern: lateral hip instability, trunk flexed,  and wide BOS Distance walked: 915 Assistive device utilized: None Level of assistance: Complete Independence Comments: increased flexed posture with increased distance  FUNCTIONAL TESTS:  5 times sit to stand: 19.11 with UE support; 21.44 with UE support on knees.  Timed up and go (TUG): 15.64, 13.74 sec  6 minute walk test: 912ft  10 meter walk test: 12.43 and 11.68 Berg Balance Scale: TBD Dynamic Gait Index: TBD  PATIENT SURVEYS:  ABC scale 60                                                                                                                              TREATMENT DATE: DATE: 08/25/23  Gait belt donned throughout for pt safety, CGA provided unless specified otherwise   NMR:   Dynamic step tap with 4# without UE support x 20 reps (VC to slow cadence)   TE:  LAQ 4# 2 x 10 reps Seated hip flex 4# 2 x 10 reps    TA:   SIT TO STAND from chair without UE support- 2 x 10 reps   Lunge step onto 6" block 4# x 15 rep alt LE   Side step 4# at support bar- 2-3 wide steps then back opp direction x 10   Dynamic step up onto 6" block 4# x 15 reps each  Calf raises on 1/2 foam 2 x 15 reps  Toe raises on 1/2 foam 2 x 15 reps    PATIENT EDUCATION: Education details: exercise technique, Person educated: Patient Education method: Archivist, Demo Education comprehension: verbalized understanding, returned demo  HOME EXERCISE PROGRAM:  Access Code: URL: [email or web address] Date: 06/23/2023 Prepared by: Carlen Chasten  Exercises - Doorway Pec Stretch at 60 Elevation  - 1 x daily - 7 x weekly  - 2 sets - 1 minute hold - Standing Shoulder Row with Anchored Resistance  - 1 x daily - 7 x weekly - 3 sets - 10 reps - Low Trap Setting at Wall  - 1 x daily - 7 x weekly - 2 sets - 10 reps - Standing Tandem Balance with Counter Support  - 3 x weekly - 2 sets - 30 sec hold - Standing Near Stance in Corner with Eyes Closed  - 1 x daily - 7 x weekly - 2 sets - 30 seconds hold - Standing Single Leg Stance with Counter Support  - 3 x weekly - 3-5 sets - as long as possible hold - Standing March with Counter Support  - 3 x weekly - 3 sets - 10 reps - Side Stepping with Counter Support  - 1 x daily - 3 x weekly - 3 sets - 3 reps - Backward Walking with Counter Support  - 1 x daily - 7 x weekly - 3 sets - 5 reps   GOALS: Goals reviewed with patient? Yes  SHORT TERM GOALS: Target date: 09/13/2023    Patient will be independent in home exercise program to  improve strength/mobility for better functional independence with ADLs. Baseline: 06/07/23: HEP provided  06/16/2023: pt reports consistent compliance with HEP 06/23/2023: updated HEP provided Goal status: IN PROGRESS   LONG TERM GOALS: Target date: 10/25/2023   Patient will increase ABC score to equal to or greater than 72  to demonstrate statistically significant improvement in mobility and quality of life.  Baseline: 60 06/16/2023: 55% 07/26/23: 70.6%  Goal status: IN PROGRESS  2.  Patient (> 18 years old) will complete five times sit to stand test in < 15 seconds indicating an increased LE strength and improved balance. Baseline: 21.44 06/16/2023: 18.38 seconds no UE support 07/26/23: 15.55 seconds no UE support  Goal status: IN PROGRESS  3.  Patient will increase Berg Balance score by > 6 points to demonstrate decreased fall risk during functional activities Baseline: 1/30= 38/56 06/16/2023: 40/56 07/26/23: 42/56 Goal status: IN PROGRESS  4.  Patient will increase 10 meter walk test to >1.86m/s as to improve gait speed for better community  ambulation and to reduce fall risk. Baseline: 0.83 m/s 06/16/2023: 0.83 m/s (12 seconds) without AD 07/26/23: 0.96 m/s with no AD  Goal status: IN PROGRESS  5.  Patient will reduce timed up and go to <11 seconds to reduce fall risk and demonstrate improved transfer/gait ability. Baseline: 14 sec  06/16/2023: 12.57 seconds no AD 07/26/23: 11.88 seconds no AD  Goal status: IN PROGRESS  6.  Patient will increase dynamic gait index score to >19/24 as to demonstrate reduced fall risk and improved dynamic gait balance for better safety with community/home ambulation.  Baseline: 05/12/23: 17 06/16/2023: 17/24 07/26/23: 18/24 Goal status: IN PROGRESS    ASSESSMENT:  CLINICAL IMPRESSION:    Patient continues to present with good motivation for today's treatment. Treatment again focused on stepping over obstacles and maintaining his balance which initially he struggled with yet improved with practice. He was able to maintain tandem standing and less VC for step length with retro walking and improved steadiness overall vs. Previous treatment.   Pt will continue to benefit from skilled physical therapy intervention to address impairments, improve QOL, decrease fall risk, and attain therapy goals.    OBJECTIVE IMPAIRMENTS: Abnormal gait, cardiopulmonary status limiting activity, decreased activity tolerance, decreased balance, decreased coordination, decreased endurance, decreased knowledge of use of DME, decreased mobility, difficulty walking, decreased ROM, decreased strength, hypomobility, impaired perceived functional ability, impaired flexibility, impaired sensation, improper body mechanics, postural dysfunction, and obesity.   ACTIVITY LIMITATIONS: carrying, lifting, bending, standing, squatting, stairs, transfers, and locomotion level  PARTICIPATION LIMITATIONS: cleaning, laundry, community activity, and occupation  PERSONAL FACTORS: Age and 1-2 comorbidities: obesity CHF are also affecting patient's  functional outcome.   REHAB POTENTIAL: Good  CLINICAL DECISION MAKING: Stable/uncomplicated  EVALUATION COMPLEXITY: Moderate  PLAN:  PT FREQUENCY: 1-2x/week  PT DURATION: 12 weeks  PLANNED INTERVENTIONS: 97110-Therapeutic exercises, 97530- Therapeutic activity, 97112- Neuromuscular re-education, 97535- Self Care, 54098- Manual therapy, [phone]- Gait training, Patient/Family education, Balance training, Stair training, Taping, DME instructions, Cryotherapy, and Moist heat  PLAN FOR NEXT SESSION:  Dynamic balance interventions Fwd/back stepping over 1/2 foam roll vs hurdle with blaze pod dual-task  Static balance Tandem stance --> progress to eyes closed Dynamic gait training including: quick turning  stepping over obstacles agility ladder BLE strengthening    Ossie Blend, PT  Physical Therapist - [facility]  7:11 AM 08/26/23

## 2023-08-26 ENCOUNTER — Other Ambulatory Visit (HOSPITAL_COMMUNITY): Payer: Self-pay

## 2023-08-30 ENCOUNTER — Other Ambulatory Visit (HOSPITAL_COMMUNITY): Payer: Self-pay | Admitting: Cardiology

## 2023-08-30 ENCOUNTER — Ambulatory Visit: Payer: PPO | Admitting: Physical Therapy

## 2023-08-30 ENCOUNTER — Ambulatory Visit: Payer: PPO

## 2023-08-30 DIAGNOSIS — R262 Difficulty in walking, not elsewhere classified: Secondary | ICD-10-CM

## 2023-08-30 DIAGNOSIS — M6281 Muscle weakness (generalized): Secondary | ICD-10-CM

## 2023-08-30 DIAGNOSIS — R2681 Unsteadiness on feet: Secondary | ICD-10-CM

## 2023-08-30 DIAGNOSIS — R2689 Other abnormalities of gait and mobility: Secondary | ICD-10-CM

## 2023-08-30 DIAGNOSIS — I5043 Acute on chronic combined systolic (congestive) and diastolic (congestive) heart failure: Secondary | ICD-10-CM

## 2023-08-30 NOTE — Therapy (Signed)
 OUTPATIENT PHYSICAL THERAPY NEURO TREATMENT   Patient Name: Anthony Weber MRN: [id number] DOB:[birth year], 78 y.o., male Today's Date: 08/31/2023   PCP: Westley Hammers, MD  REFERRING PROVIDER: Mardell Shade, MD   END OF SESSION:   PT End of Session - 08/30/23 1538     Visit Number 29    Number of Visits 46    Date for PT Re-Evaluation 10/25/23    Progress Note Due on Visit 30    PT Start Time 1533    PT Stop Time 1613    PT Time Calculation (min) 40 min    Equipment Utilized During Treatment Gait belt    Activity Tolerance Patient tolerated treatment well    Behavior During Therapy WFL for tasks assessed/performed                     Past Medical History:  Diagnosis Date   Depression    Hypertension    Hypothyroidism    Past Surgical History:  Procedure Laterality Date   COLONOSCOPY WITH PROPOFOL  N/A 03/03/2015   Procedure: COLONOSCOPY WITH PROPOFOL ;  Surgeon: Cassie Click, MD;  Location: [facility] ENDOSCOPY;  Service: Endoscopy;  Laterality: N/A;   EYE SURGERY     RIGHT HEART CATH N/A 08/08/2023   Procedure: RIGHT HEART CATH;  Surgeon: Mardell Shade, MD;  Location: [facility] INVASIVE CV LAB;  Service: Cardiovascular;  Laterality: N/A;   RIGHT/LEFT HEART CATH AND CORONARY ANGIOGRAPHY N/A 01/27/2021   Procedure: RIGHT/LEFT HEART CATH AND CORONARY ANGIOGRAPHY;  Surgeon: Sammy Crisp, MD;  Location: [facility] INVASIVE CV LAB;  Service: Cardiovascular;  Laterality: N/A;   TEE WITHOUT CARDIOVERSION N/A 02/05/2021   Procedure: TRANSESOPHAGEAL ECHOCARDIOGRAM (TEE);  Surgeon: Mardell Shade, MD;  Location: [facility] ENDOSCOPY;  Service: Cardiovascular;  Laterality: N/A;   Patient Active Problem List   Diagnosis Date Noted   Hyponatremia    Acute on chronic combined systolic and diastolic congestive heart failure (HCC)    Acute on chronic respiratory failure with hypoxia and hypercapnia (HCC)    MSSA bacteremia 02/03/2021   Central line infection     Pneumonia of both lower lobes due to methicillin susceptible Staphylococcus aureus (MSSA) (HCC)    Cardiogenic shock (HCC) 01/29/2021   Primary hypertension    Acute HFrEF (heart failure with reduced ejection fraction) (HCC)    Acute CHF (congestive heart failure) (HCC) 01/18/2021    ONSET DATE: 3 years.   REFERRING DIAG:  Diagnosis  R26.89 (ICD-10-CM) - Imbalance    THERAPY DIAG:  Unsteadiness on feet  Difficulty in walking, not elsewhere classified  Muscle weakness (generalized)  Balance disorder  Imbalance  Rationale for Evaluation and Treatment: Rehabilitation  SUBJECTIVE:  SUBJECTIVE STATEMENT:     Pt reports feeling like he can start to see  a little improvement in his balance.     States compliant with HEP    Pt accompanied by: self  PERTINENT HISTORY:   From recent MD appointment. "Admitted to [facility] 01/2021  acute HFrEF. Echo  EF 25-30% from previous EF 60-65%. R/LHC w/ no CAD, moderately elevated left heart and pulmonary artery pressures, severely elevated right heart filling pressures, severely reduced Fick cardiac output/index.  Required intropes, pressor, and lasix  drip.  Transferred to [facility] for cardiogenic/septic shock, + MSSA bacteremia. Underwent TEE showing EF 25-30%, RV moderately down, severe biatrial enlargement and moderate MR, no valvular vegetation. Required addition of mexitiline to suppress PVCs. Hospitalization c/b AKI and hypoxic/hypercarbic respiratory failure.   Discharged from [facility] 03/11/21 after he completed IV antibiotics and rehab.    At last visit midodrine  stopped and GDMT started.    Sleep study 4/23 AHI 2   Here for routine f/u. Here for f/u with his wife. Says he is doing pretty well. 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 Walking 1 mile at the [place]  almost everyday. And riding stationary bike for 30 mins every night."  PAIN:  Are you having pain? No  PRECAUTIONS: None  RED FLAGS: None   WEIGHT BEARING RESTRICTIONS: No  FALLS: Has patient fallen in last 6 months? No  LIVING ENVIRONMENT: Lives with: lives with their spouse Lives in: House/apartment Stairs: Yes: Internal: 12 steps; on right going up and on left going up and External: 1 steps; on right going up and on left going up Has following equipment at home: Single point cane and does not use often    PLOF: Independent, Independent with basic ADLs, Independent with gait, and Independent with transfers  PATIENT GOALS: improve balance and strength in legs.   OBJECTIVE:  Note: Objective measures were completed at Evaluation unless otherwise noted.  DIAGNOSTIC FINDINGS:  None relevant   COGNITION: Overall cognitive status: Within functional limits for tasks assessed   SENSATION: Light touch: Impaired  and intermittent n/t in feet up to shins    COORDINATION: WFL   EDEMA:  Mild edema in bil LE   MUSCLE TONE: WFL   POSTURE: rounded shoulders and forward head  LOWER EXTREMITY ROM:     Grossly WFL  LOWER EXTREMITY MMT:    MMT Right Eval Left Eval  Hip flexion 4 4  Hip extension    Hip abduction 4+ 4+  Hip adduction 4 4  Hip internal rotation    Hip external rotation    Knee flexion 4+ 4+  Knee extension 4+ 4+  Ankle dorsiflexion 4+ 4+  Ankle plantarflexion    Ankle inversion    Ankle eversion    (Blank rows = not tested)  BED MOBILITY:  Sit to supine Complete Independence Supine to sit Complete Independence  TRANSFERS: Assistive device utilized: None  Sit to stand: Complete Independence Stand to sit: Complete Independence Chair to chair: Complete Independence Floor: unable to complete .  Requires UE support topush from thighs to stand   RAMP:  Level of Assistance: Complete Independence Assistive device utilized:  Ramp Comments:    CURB:  Level of Assistance: Modified independence Assistive device utilized: rail Curb Comments:   STAIRS: Level of Assistance: SBA Stair Negotiation Technique: Step to Pattern Alternating Pattern  with Single Rail on Right Number of Stairs: 4  Height of Stairs: 6  Comments: step to on descent  GAIT: Gait  pattern: lateral hip instability, trunk flexed, and wide BOS Distance walked: 915 Assistive device utilized: None Level of assistance: Complete Independence Comments: increased flexed posture with increased distance  FUNCTIONAL TESTS:  5 times sit to stand: 19.11 with UE support; 21.44 with UE support on knees.  Timed up and go (TUG): 15.64, 13.74 sec  6 minute walk test: 927ft  10 meter walk test: 12.43 and 11.68 Berg Balance Scale: TBD Dynamic Gait Index: TBD  PATIENT SURVEYS:  ABC scale 60                                                                                                                              TREATMENT DATE: DATE: 08/25/23   Gait belt donned throughout for pt safety, CGA provided unless specified otherwise   NMR:   Dynamic step tap  without UE support x 20 reps (VC to slow cadence)  Staggered standing (1 foot on airex and 1 on 6" step) x 30 sec x 2 each direction  Staggered (1 foot on airex and 1 on 6" step) - horizontal Head turns 2 x 10 rep.   Staggered as above with dual task arranging dry magnet letters in alphabetical order x several minutes.   Tandem standing on airex pad- Attempted multiple trials - unable to hold > 5 sec at one time.   Step up onto airex pad without UE Support x 15 reps.       PATIENT EDUCATION: Education details: exercise technique, Person educated: Patient Education method: Archivist, Demo Education comprehension: verbalized understanding, returned demo  HOME EXERCISE PROGRAM:  Access Code: URL: [email or web address] Date: 06/23/2023 Prepared by: Carlen Chasten  Exercises - Doorway Pec Stretch at 60 Elevation  - 1 x daily - 7 x weekly - 2 sets - 1 minute hold - Standing Shoulder Row with Anchored Resistance  - 1 x daily - 7 x weekly - 3 sets - 10 reps - Low Trap Setting at Wall  - 1 x daily - 7 x weekly - 2 sets - 10 reps - Standing Tandem Balance with Counter Support  - 3 x weekly - 2 sets - 30 sec hold - Standing Near Stance in Corner with Eyes Closed  - 1 x daily - 7 x weekly - 2 sets - 30 seconds hold - Standing Single Leg Stance with Counter Support  - 3 x weekly - 3-5 sets - as long as possible hold - Standing March with Counter Support  - 3 x weekly - 3 sets - 10 reps - Side Stepping with Counter Support  - 1 x daily - 3 x weekly - 3 sets - 3 reps - Backward Walking with Counter Support  - 1 x daily - 7 x weekly - 3 sets - 5 reps   GOALS: Goals reviewed with patient? Yes  SHORT TERM GOALS: Target date: 09/13/2023    Patient will be independent in home exercise program to improve  strength/mobility for better functional independence with ADLs. Baseline: 06/07/23: HEP provided  06/16/2023: pt reports consistent compliance with HEP 06/23/2023: updated HEP provided Goal status: IN PROGRESS   LONG TERM GOALS: Target date: 10/25/2023   Patient will increase ABC score to equal to or greater than 72  to demonstrate statistically significant improvement in mobility and quality of life.  Baseline: 60 06/16/2023: 55% 07/26/23: 70.6%  Goal status: IN PROGRESS  2.  Patient (> 3 years old) will complete five times sit to stand test in < 15 seconds indicating an increased LE strength and improved balance. Baseline: 21.44 06/16/2023: 18.38 seconds no UE support 07/26/23: 15.55 seconds no UE support  Goal status: IN PROGRESS  3.  Patient will increase Berg Balance score by > 6 points to demonstrate decreased fall risk during functional activities Baseline: 1/30= 38/56 06/16/2023: 40/56 07/26/23: 42/56 Goal status: IN PROGRESS  4.  Patient will  increase 10 meter walk test to >1.69m/s as to improve gait speed for better community ambulation and to reduce fall risk. Baseline: 0.83 m/s 06/16/2023: 0.83 m/s (12 seconds) without AD 07/26/23: 0.96 m/s with no AD  Goal status: IN PROGRESS  5.  Patient will reduce timed up and go to <11 seconds to reduce fall risk and demonstrate improved transfer/gait ability. Baseline: 14 sec  06/16/2023: 12.57 seconds no AD 07/26/23: 11.88 seconds no AD  Goal status: IN PROGRESS  6.  Patient will increase dynamic gait index score to >19/24 as to demonstrate reduced fall risk and improved dynamic gait balance for better safety with community/home ambulation.  Baseline: 05/12/23: 17 06/16/2023: 17/24 07/26/23: 18/24 Goal status: IN PROGRESS    ASSESSMENT:  CLINICAL IMPRESSION:    Patient presents with improving dynamic balance - mostly using ankle righting reaction with less overall UE support required. He was responsive to all VC for safety and able to improve with staggered and later tandem standing.   Pt will continue to benefit from skilled physical therapy intervention to address impairments, improve QOL, decrease fall risk, and attain therapy goals.    OBJECTIVE IMPAIRMENTS: Abnormal gait, cardiopulmonary status limiting activity, decreased activity tolerance, decreased balance, decreased coordination, decreased endurance, decreased knowledge of use of DME, decreased mobility, difficulty walking, decreased ROM, decreased strength, hypomobility, impaired perceived functional ability, impaired flexibility, impaired sensation, improper body mechanics, postural dysfunction, and obesity.   ACTIVITY LIMITATIONS: carrying, lifting, bending, standing, squatting, stairs, transfers, and locomotion level  PARTICIPATION LIMITATIONS: cleaning, laundry, community activity, and occupation  PERSONAL FACTORS: Age and 1-2 comorbidities: obesity CHF are also affecting patient's functional outcome.   REHAB POTENTIAL:  Good  CLINICAL DECISION MAKING: Stable/uncomplicated  EVALUATION COMPLEXITY: Moderate  PLAN:  PT FREQUENCY: 1-2x/week  PT DURATION: 12 weeks  PLANNED INTERVENTIONS: 97110-Therapeutic exercises, 97530- Therapeutic activity, 97112- Neuromuscular re-education, 97535- Self Care, [id number]- Manual therapy, [phone]- Gait training, Patient/Family education, Balance training, Stair training, Taping, DME instructions, Cryotherapy, and Moist heat  PLAN FOR NEXT SESSION:  Dynamic balance interventions Fwd/back stepping over 1/2 foam roll vs hurdle with blaze pod dual-task  Static balance Tandem stance --> progress to eyes closed Dynamic gait training including: quick turning  stepping over obstacles agility ladder BLE strengthening    Ossie Blend, PT  Physical Therapist - [facility]  [facility]  8:18 AM 08/31/23

## 2023-08-31 ENCOUNTER — Other Ambulatory Visit (HOSPITAL_COMMUNITY): Payer: Self-pay

## 2023-09-01 ENCOUNTER — Ambulatory Visit: Payer: PPO

## 2023-09-01 ENCOUNTER — Other Ambulatory Visit (HOSPITAL_COMMUNITY): Payer: Self-pay

## 2023-09-01 DIAGNOSIS — R2689 Other abnormalities of gait and mobility: Secondary | ICD-10-CM

## 2023-09-01 DIAGNOSIS — M6281 Muscle weakness (generalized): Secondary | ICD-10-CM

## 2023-09-01 DIAGNOSIS — R2681 Unsteadiness on feet: Secondary | ICD-10-CM | POA: Diagnosis not present

## 2023-09-01 DIAGNOSIS — R262 Difficulty in walking, not elsewhere classified: Secondary | ICD-10-CM

## 2023-09-01 MED ORDER — LOSARTAN POTASSIUM 25 MG PO TABS
12.5000 mg | ORAL_TABLET | Freq: Every day | ORAL | 3 refills | Status: DC
  Filled 2023-09-01: qty 50, 100d supply, fill #0
  Filled 2023-12-04: qty 50, 100d supply, fill #1

## 2023-09-01 NOTE — Therapy (Signed)
 OUTPATIENT PHYSICAL THERAPY NEURO TREATMENT/PHYSICAL THERAPY PROGRESS NOTE   Dates of reporting period  07/26/23   to   09/01/23    Patient Name: Anthony Weber MRN: [id number] DOB:[birth year], 78 y.o., male Today's Date: 09/01/2023   PCP: Westley Hammers, MD  REFERRING PROVIDER: Mardell Shade, MD   END OF SESSION:   PT End of Session - 09/01/23 1017     Visit Number 30    Number of Visits 46    Date for PT Re-Evaluation 10/25/23    Progress Note Due on Visit 30    PT Start Time 1017    PT Stop Time 1100    PT Time Calculation (min) 43 min    Equipment Utilized During Treatment Gait belt    Activity Tolerance Patient tolerated treatment well    Behavior During Therapy WFL for tasks assessed/performed              Past Medical History:  Diagnosis Date   Depression    Hypertension    Hypothyroidism    Past Surgical History:  Procedure Laterality Date   COLONOSCOPY WITH PROPOFOL  N/A 03/03/2015   Procedure: COLONOSCOPY WITH PROPOFOL ;  Surgeon: Cassie Click, MD;  Location: [facility] ENDOSCOPY;  Service: Endoscopy;  Laterality: N/A;   EYE SURGERY     RIGHT HEART CATH N/A 08/08/2023   Procedure: RIGHT HEART CATH;  Surgeon: Mardell Shade, MD;  Location: [facility] INVASIVE CV LAB;  Service: Cardiovascular;  Laterality: N/A;   RIGHT/LEFT HEART CATH AND CORONARY ANGIOGRAPHY N/A 01/27/2021   Procedure: RIGHT/LEFT HEART CATH AND CORONARY ANGIOGRAPHY;  Surgeon: Sammy Crisp, MD;  Location: [facility] INVASIVE CV LAB;  Service: Cardiovascular;  Laterality: N/A;   TEE WITHOUT CARDIOVERSION N/A 02/05/2021   Procedure: TRANSESOPHAGEAL ECHOCARDIOGRAM (TEE);  Surgeon: Mardell Shade, MD;  Location: [facility] ENDOSCOPY;  Service: Cardiovascular;  Laterality: N/A;   Patient Active Problem List   Diagnosis Date Noted   Hyponatremia    Acute on chronic combined systolic and diastolic congestive heart failure (HCC)    Acute on chronic respiratory failure with hypoxia and  hypercapnia (HCC)    MSSA bacteremia 02/03/2021   Central line infection    Pneumonia of both lower lobes due to methicillin susceptible Staphylococcus aureus (MSSA) (HCC)    Cardiogenic shock (HCC) 01/29/2021   Primary hypertension    Acute HFrEF (heart failure with reduced ejection fraction) (HCC)    Acute CHF (congestive heart failure) (HCC) 01/18/2021    ONSET DATE: 3 years.   REFERRING DIAG:  Diagnosis  R26.89 (ICD-10-CM) - Imbalance    THERAPY DIAG:  Unsteadiness on feet  Difficulty in walking, not elsewhere classified  Muscle weakness (generalized)  Balance disorder  Imbalance  Rationale for Evaluation and Treatment: Rehabilitation  SUBJECTIVE:  SUBJECTIVE STATEMENT:     Pt reports he is ready for his progress report and felt that one was coming.  Pt notes he feels as though he is getting better with therapy and his balance is improving.   States compliant with HEP    Pt accompanied by: self  PERTINENT HISTORY:   From recent MD appointment. "Admitted to [facility] 01/2021  acute HFrEF. Echo  EF 25-30% from previous EF 60-65%. R/LHC w/ no CAD, moderately elevated left heart and pulmonary artery pressures, severely elevated right heart filling pressures, severely reduced Fick cardiac output/index.  Required intropes, pressor, and lasix  drip.  Transferred to [facility] for cardiogenic/septic shock, + MSSA bacteremia. Underwent TEE showing EF 25-30%, RV moderately down, severe biatrial enlargement and moderate MR, no valvular vegetation. Required addition of mexitiline to suppress PVCs. Hospitalization c/b AKI and hypoxic/hypercarbic respiratory failure.   Discharged from [facility] 03/11/21 after he completed IV antibiotics and rehab.    At last visit midodrine  stopped and GDMT started.    Sleep study  4/23 AHI 2   Here for routine f/u. Here for f/u with his wife. Says he is doing pretty well. 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 Walking 1 mile at the [place] almost everyday. And riding stationary bike for 30 mins every night."  PAIN:  Are you having pain? No  PRECAUTIONS: None  RED FLAGS: None   WEIGHT BEARING RESTRICTIONS: No  FALLS: Has patient fallen in last 6 months? No  LIVING ENVIRONMENT: Lives with: lives with their spouse Lives in: House/apartment Stairs: Yes: Internal: 12 steps; on right going up and on left going up and External: 1 steps; on right going up and on left going up Has following equipment at home: Single point cane and does not use often    PLOF: Independent, Independent with basic ADLs, Independent with gait, and Independent with transfers  PATIENT GOALS: improve balance and strength in legs.   OBJECTIVE:  Note: Objective measures were completed at Evaluation unless otherwise noted.  DIAGNOSTIC FINDINGS:  None relevant   COGNITION: Overall cognitive status: Within functional limits for tasks assessed   SENSATION: Light touch: Impaired  and intermittent n/t in feet up to shins    COORDINATION: WFL   EDEMA:  Mild edema in bil LE   MUSCLE TONE: WFL   POSTURE: rounded shoulders and forward head  LOWER EXTREMITY ROM:     Grossly WFL  LOWER EXTREMITY MMT:    MMT Right Eval Left Eval  Hip flexion 4 4  Hip extension    Hip abduction 4+ 4+  Hip adduction 4 4  Hip internal rotation    Hip external rotation    Knee flexion 4+ 4+  Knee extension 4+ 4+  Ankle dorsiflexion 4+ 4+  Ankle plantarflexion    Ankle inversion    Ankle eversion    (Blank rows = not tested)  BED MOBILITY:  Sit to supine Complete Independence Supine to sit Complete Independence  TRANSFERS: Assistive device utilized: None  Sit to stand: Complete Independence Stand to sit: Complete Independence Chair to chair: Complete Independence Floor: unable to  complete .  Requires UE support topush from thighs to stand   RAMP:  Level of Assistance: Complete Independence Assistive device utilized:  Ramp Comments:   CURB:  Level of Assistance: Modified independence Assistive device utilized: rail Curb Comments:   STAIRS: Level of Assistance: SBA Stair Negotiation Technique: Step to Pattern Alternating Pattern  with Single Rail on Right Number of Stairs:  4  Height of Stairs: 6  Comments: step to on descent  GAIT: Gait pattern: lateral hip instability, trunk flexed, and wide BOS Distance walked: 915 Assistive device utilized: None Level of assistance: Complete Independence Comments: increased flexed posture with increased distance  FUNCTIONAL TESTS:  5 times sit to stand: 19.11 with UE support; 21.44 with UE support on knees.  Timed up and go (TUG): 15.64, 13.74 sec  6 minute walk test: 91ft  10 meter walk test: 12.43 and 11.68 Berg Balance Scale: TBD Dynamic Gait Index: TBD  PATIENT SURVEYS:  ABC scale 60                                                                                                                              TREATMENT DATE: DATE: 09/01/23     Gait belt donned throughout for pt safety, CGA provided unless specified otherwise   NMR:   Goal assessment performed and noted below:  ABC Score: 73.75% 5x STS: 14.43 sec with no UE support BERG: 47/56 66m Walk Test: 1.25 m/s at fast speed TUG: 10.26 sec DGI: 21/24      PATIENT EDUCATION: Education details: exercise technique, Person educated: Patient Education method: Archivist, Demo Education comprehension: verbalized understanding, returned demo  HOME EXERCISE PROGRAM:  Access Code: URL: [email or web address] Date: 06/23/2023 Prepared by: Carlen Chasten  Exercises - Doorway Pec Stretch at 60 Elevation  - 1 x daily - 7 x weekly - 2 sets - 1 minute hold - Standing Shoulder Row with Anchored Resistance  - 1 x  daily - 7 x weekly - 3 sets - 10 reps - Low Trap Setting at Wall  - 1 x daily - 7 x weekly - 2 sets - 10 reps - Standing Tandem Balance with Counter Support  - 3 x weekly - 2 sets - 30 sec hold - Standing Near Stance in Corner with Eyes Closed  - 1 x daily - 7 x weekly - 2 sets - 30 seconds hold - Standing Single Leg Stance with Counter Support  - 3 x weekly - 3-5 sets - as long as possible hold - Standing March with Counter Support  - 3 x weekly - 3 sets - 10 reps - Side Stepping with Counter Support  - 1 x daily - 3 x weekly - 3 sets - 3 reps - Backward Walking with Counter Support  - 1 x daily - 7 x weekly - 3 sets - 5 reps   GOALS: Goals reviewed with patient? Yes  SHORT TERM GOALS: Target date: 09/13/2023    Patient will be independent in home exercise program to improve strength/mobility for better functional independence with ADLs. Baseline: 06/07/23: HEP provided  06/16/2023: pt reports consistent compliance with HEP 06/23/2023: updated HEP provided Goal status: IN PROGRESS   LONG TERM GOALS: Target date: 10/25/2023   Patient will increase ABC score to equal to or greater than 72  to demonstrate statistically significant  improvement in mobility and quality of life.  Baseline: 60 06/16/2023: 55% 07/26/23: 70.6%  09/04/23: 73.75% Goal status: IN PROGRESS  2.  Patient (> 12 years old) will complete five times sit to stand test in < 15 seconds indicating an increased LE strength and improved balance. Baseline: 21.44 06/16/2023: 18.38 seconds no UE support 07/26/23: 15.55 seconds no UE support  09/01/23: 14.43 seconds no UE support Goal status: MET  3.  Patient will increase Berg Balance score by > 6 points to demonstrate decreased fall risk during functional activities Baseline: 1/30= 38/56 06/16/2023: 40/56 07/26/23: 42/56 09/01/23: 47/56 Goal status: MET  4.  Patient will increase 10 meter walk test to >1.4m/s as to improve gait speed for better community ambulation and to reduce  fall risk. Baseline: 0.83 m/s 06/16/2023: 0.83 m/s (12 seconds) without AD 07/26/23: 0.96 m/s with no AD  09/01/23: 1.25 m/s with no AD (fast speed) Goal status: IN PROGRESS  5.  Patient will reduce timed up and go to <11 seconds to reduce fall risk and demonstrate improved transfer/gait ability. Baseline: 14 sec  06/16/2023: 12.57 seconds no AD 07/26/23: 11.88 seconds no AD  09/01/23: 10.26 seconds no AD Goal status: MET  6.  Patient will increase dynamic gait index score to >19/24 as to demonstrate reduced fall risk and improved dynamic gait balance for better safety with community/home ambulation.  Baseline: 05/12/23: 17 06/16/2023: 17/24 07/26/23: 18/24 09/01/23: 21/24 Goal status: IN PROGRESS  7.  Patient will increase FGA score by > 4 points to demonstrate decreased fall risk during functional activities. Baseline: to perform at next visit Goal status: NEW  ASSESSMENT:  CLINICAL IMPRESSION:     Pt has made significant progress towards goals, meeting all goals at this point.  Due to time constraints, unable to perform FGA, however pt would benefit from performing at the next session in order to assess a harder balance test and see his performance with it.  Patient's condition has the potential to improve in response to therapy. Maximum improvement is yet to be obtained. The anticipated improvement is attainable and reasonable in a generally predictable time.   Pt will continue to benefit from skilled therapy to address remaining deficits in order to improve overall QoL and return to PLOF.      OBJECTIVE IMPAIRMENTS: Abnormal gait, cardiopulmonary status limiting activity, decreased activity tolerance, decreased balance, decreased coordination, decreased endurance, decreased knowledge of use of DME, decreased mobility, difficulty walking, decreased ROM, decreased strength, hypomobility, impaired perceived functional ability, impaired flexibility, impaired sensation, improper body mechanics,  postural dysfunction, and obesity.   ACTIVITY LIMITATIONS: carrying, lifting, bending, standing, squatting, stairs, transfers, and locomotion level  PARTICIPATION LIMITATIONS: cleaning, laundry, community activity, and occupation  PERSONAL FACTORS: Age and 1-2 comorbidities: obesity CHF are also affecting patient's functional outcome.   REHAB POTENTIAL: Good  CLINICAL DECISION MAKING: Stable/uncomplicated  EVALUATION COMPLEXITY: Moderate  PLAN:  PT FREQUENCY: 1-2x/week  PT DURATION: 12 weeks  PLANNED INTERVENTIONS: 97110-Therapeutic exercises, 97530- Therapeutic activity, 97112- Neuromuscular re-education, 97535- Self Care, [id number]- Manual therapy, [phone]- Gait training, Patient/Family education, Balance training, Stair training, Taping, DME instructions, Cryotherapy, and Moist heat  PLAN FOR NEXT SESSION:  Dynamic balance interventions Fwd/back stepping over 1/2 foam roll vs hurdle with blaze pod dual-task  Static balance Tandem stance --> progress to eyes closed Dynamic gait training including: quick turning  stepping over obstacles agility ladder BLE strengthening    Rozanna Corner, PT, DPT Physical Therapist - [facility]  09/01/23, 12:58 PM

## 2023-09-06 ENCOUNTER — Ambulatory Visit: Payer: PPO | Admitting: Physical Therapy

## 2023-09-06 ENCOUNTER — Ambulatory Visit: Payer: PPO

## 2023-09-06 DIAGNOSIS — R2681 Unsteadiness on feet: Secondary | ICD-10-CM

## 2023-09-06 DIAGNOSIS — M6281 Muscle weakness (generalized): Secondary | ICD-10-CM

## 2023-09-06 DIAGNOSIS — R2689 Other abnormalities of gait and mobility: Secondary | ICD-10-CM

## 2023-09-06 DIAGNOSIS — R262 Difficulty in walking, not elsewhere classified: Secondary | ICD-10-CM

## 2023-09-06 NOTE — Therapy (Signed)
 OUTPATIENT PHYSICAL THERAPY NEURO TREATMENT    Patient Name: Anthony Weber MRN: [id number] DOB:[birth year], 78 y.o., male Today's Date: 09/06/2023   PCP: Westley Hammers, MD  REFERRING PROVIDER: Mardell Shade, MD   END OF SESSION:   PT End of Session - 09/06/23 1534     Visit Number 32    Number of Visits 46    Date for PT Re-Evaluation 10/25/23    Progress Note Due on Visit 40    PT Start Time 1530    PT Stop Time 1615    PT Time Calculation (min) 45 min    Equipment Utilized During Treatment Gait belt    Activity Tolerance Patient tolerated treatment well    Behavior During Therapy WFL for tasks assessed/performed               Past Medical History:  Diagnosis Date   Depression    Hypertension    Hypothyroidism    Past Surgical History:  Procedure Laterality Date   COLONOSCOPY WITH PROPOFOL  N/A 03/03/2015   Procedure: COLONOSCOPY WITH PROPOFOL ;  Surgeon: Cassie Click, MD;  Location: [facility] ENDOSCOPY;  Service: Endoscopy;  Laterality: N/A;   EYE SURGERY     RIGHT HEART CATH N/A 08/08/2023   Procedure: RIGHT HEART CATH;  Surgeon: Mardell Shade, MD;  Location: [facility] INVASIVE CV LAB;  Service: Cardiovascular;  Laterality: N/A;   RIGHT/LEFT HEART CATH AND CORONARY ANGIOGRAPHY N/A 01/27/2021   Procedure: RIGHT/LEFT HEART CATH AND CORONARY ANGIOGRAPHY;  Surgeon: Sammy Crisp, MD;  Location: [facility] INVASIVE CV LAB;  Service: Cardiovascular;  Laterality: N/A;   TEE WITHOUT CARDIOVERSION N/A 02/05/2021   Procedure: TRANSESOPHAGEAL ECHOCARDIOGRAM (TEE);  Surgeon: Mardell Shade, MD;  Location: [facility] ENDOSCOPY;  Service: Cardiovascular;  Laterality: N/A;   Patient Active Problem List   Diagnosis Date Noted   Hyponatremia    Acute on chronic combined systolic and diastolic congestive heart failure (HCC)    Acute on chronic respiratory failure with hypoxia and hypercapnia (HCC)    MSSA bacteremia 02/03/2021   Central line infection    Pneumonia of  both lower lobes due to methicillin susceptible Staphylococcus aureus (MSSA) (HCC)    Cardiogenic shock (HCC) 01/29/2021   Primary hypertension    Acute HFrEF (heart failure with reduced ejection fraction) (HCC)    Acute CHF (congestive heart failure) (HCC) 01/18/2021    ONSET DATE: 3 years.   REFERRING DIAG:  Diagnosis  R26.89 (ICD-10-CM) - Imbalance    THERAPY DIAG:  Unsteadiness on feet  Difficulty in walking, not elsewhere classified  Muscle weakness (generalized)  Balance disorder  Imbalance  Rationale for Evaluation and Treatment: Rehabilitation  SUBJECTIVE:  SUBJECTIVE STATEMENT:     Patient reports his balance is back and forth but better overall.    States compliant with HEP    Pt accompanied by: self  PERTINENT HISTORY:   From recent MD appointment. "Admitted to [facility] 01/2021  acute HFrEF. Echo  EF 25-30% from previous EF 60-65%. R/LHC w/ no CAD, moderately elevated left heart and pulmonary artery pressures, severely elevated right heart filling pressures, severely reduced Fick cardiac output/index.  Required intropes, pressor, and lasix  drip.  Transferred to [facility] for cardiogenic/septic shock, + MSSA bacteremia. Underwent TEE showing EF 25-30%, RV moderately down, severe biatrial enlargement and moderate MR, no valvular vegetation. Required addition of mexitiline to suppress PVCs. Hospitalization c/b AKI and hypoxic/hypercarbic respiratory failure.   Discharged from [facility] 03/11/21 after he completed IV antibiotics and rehab.    At last visit midodrine  stopped and GDMT started.    Sleep study 4/23 AHI 2   Here for routine f/u. Here for f/u with his wife. Says he is doing pretty well. 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 Walking 1 mile at the [place] almost everyday. And riding  stationary bike for 30 mins every night."  PAIN:  Are you having pain? No  PRECAUTIONS: None  RED FLAGS: None   WEIGHT BEARING RESTRICTIONS: No  FALLS: Has patient fallen in last 6 months? No  LIVING ENVIRONMENT: Lives with: lives with their spouse Lives in: House/apartment Stairs: Yes: Internal: 12 steps; on right going up and on left going up and External: 1 steps; on right going up and on left going up Has following equipment at home: Single point cane and does not use often    PLOF: Independent, Independent with basic ADLs, Independent with gait, and Independent with transfers  PATIENT GOALS: improve balance and strength in legs.   OBJECTIVE:  Note: Objective measures were completed at Evaluation unless otherwise noted.  DIAGNOSTIC FINDINGS:  None relevant   COGNITION: Overall cognitive status: Within functional limits for tasks assessed   SENSATION: Light touch: Impaired  and intermittent n/t in feet up to shins    COORDINATION: WFL   EDEMA:  Mild edema in bil LE   MUSCLE TONE: WFL   POSTURE: rounded shoulders and forward head  LOWER EXTREMITY ROM:     Grossly WFL  LOWER EXTREMITY MMT:    MMT Right Eval Left Eval  Hip flexion 4 4  Hip extension    Hip abduction 4+ 4+  Hip adduction 4 4  Hip internal rotation    Hip external rotation    Knee flexion 4+ 4+  Knee extension 4+ 4+  Ankle dorsiflexion 4+ 4+  Ankle plantarflexion    Ankle inversion    Ankle eversion    (Blank rows = not tested)  BED MOBILITY:  Sit to supine Complete Independence Supine to sit Complete Independence  TRANSFERS: Assistive device utilized: None  Sit to stand: Complete Independence Stand to sit: Complete Independence Chair to chair: Complete Independence Floor: unable to complete .  Requires UE support topush from thighs to stand   RAMP:  Level of Assistance: Complete Independence Assistive device utilized:  Ramp Comments:   CURB:  Level of Assistance:  Modified independence Assistive device utilized: rail Curb Comments:   STAIRS: Level of Assistance: SBA Stair Negotiation Technique: Step to Pattern Alternating Pattern  with Single Rail on Right Number of Stairs: 4  Height of Stairs: 6  Comments: step to on descent  GAIT: Gait pattern: lateral hip instability, trunk flexed,  and wide BOS Distance walked: 915 Assistive device utilized: None Level of assistance: Complete Independence Comments: increased flexed posture with increased distance  FUNCTIONAL TESTS:  5 times sit to stand: 19.11 with UE support; 21.44 with UE support on knees.  Timed up and go (TUG): 15.64, 13.74 sec  6 minute walk test: 949ft  10 meter walk test: 12.43 and 11.68 Berg Balance Scale: TBD Dynamic Gait Index: TBD  PATIENT SURVEYS:  ABC scale 60                                                                                                                              TREATMENT DATE: DATE: 09/06/23     Gait belt donned throughout for pt safety, CGA provided unless specified otherwise    [facility] PT Assessment - 09/06/23 1540       Functional Gait  Assessment   Gait assessed  Yes    Gait Level Surface Walks 20 ft in less than 5.5 sec, no assistive devices, good speed, no evidence for imbalance, normal gait pattern, deviates no more than 6 in outside of the 12 in walkway width.    Change in Gait Speed Able to smoothly change walking speed without loss of balance or gait deviation. Deviate no more than 6 in outside of the 12 in walkway width.    Gait with Horizontal Head Turns Performs head turns smoothly with slight change in gait velocity (eg, minor disruption to smooth gait path), deviates 6-10 in outside 12 in walkway width, or uses an assistive device.    Gait with Vertical Head Turns Performs task with slight change in gait velocity (eg, minor disruption to smooth gait path), deviates 6 - 10 in outside 12 in walkway width or uses assistive device     Gait and Pivot Turn Pivot turns safely within 3 sec and stops quickly with no loss of balance.    Step Over Obstacle Is able to step over one shoe box (4.5 in total height) but must slow down and adjust steps to clear box safely. May require verbal cueing.    Gait with Narrow Base of Support Ambulates 4-7 steps.    Gait with Eyes Closed Walks 20 ft, no assistive devices, good speed, no evidence of imbalance, normal gait pattern, deviates no more than 6 in outside 12 in walkway width. Ambulates 20 ft in less than 7 sec.    Ambulating Backwards Walks 20 ft, no assistive devices, good speed, no evidence for imbalance, normal gait    Steps Alternating feet, must use rail.    Total Score 23              NMR:   Ladder agility activities -Reciprocal step - down and back- x 8 (initially performing fast but once instructed to slow down and focus on balance -side step down and back x 3 (min lateral LOB initially)   Staggered standing on airex pad x 30 sec  Staggered standing with horizontal and then vertical head turns- x15 reps each  Step  forward toward wall and reach (diagonal) overhead- x 10 reps  Dynamic forward reaching (minisquat) to touch cone x 10 reps each Side.      PATIENT EDUCATION: Education details: exercise technique, Person educated: Patient Education method: Archivist, Demo Education comprehension: verbalized understanding, returned demo  HOME EXERCISE PROGRAM:  Access Code: URL: [email or web address] Date: 06/23/2023 Prepared by: Carlen Chasten  Exercises - Doorway Pec Stretch at 60 Elevation  - 1 x daily - 7 x weekly - 2 sets - 1 minute hold - Standing Shoulder Row with Anchored Resistance  - 1 x daily - 7 x weekly - 3 sets - 10 reps - Low Trap Setting at Wall  - 1 x daily - 7 x weekly - 2 sets - 10 reps - Standing Tandem Balance with Counter Support  - 3 x weekly - 2 sets - 30 sec hold - Standing Near Stance in Corner with Eyes  Closed  - 1 x daily - 7 x weekly - 2 sets - 30 seconds hold - Standing Single Leg Stance with Counter Support  - 3 x weekly - 3-5 sets - as long as possible hold - Standing March with Counter Support  - 3 x weekly - 3 sets - 10 reps - Side Stepping with Counter Support  - 1 x daily - 3 x weekly - 3 sets - 3 reps - Backward Walking with Counter Support  - 1 x daily - 7 x weekly - 3 sets - 5 reps   GOALS: Goals reviewed with patient? Yes  SHORT TERM GOALS: Target date: 09/13/2023    Patient will be independent in home exercise program to improve strength/mobility for better functional independence with ADLs. Baseline: 06/07/23: HEP provided  06/16/2023: pt reports consistent compliance with HEP 06/23/2023: updated HEP provided Goal status: IN PROGRESS   LONG TERM GOALS: Target date: 10/25/2023   Patient will increase ABC score to equal to or greater than 72  to demonstrate statistically significant improvement in mobility and quality of life.  Baseline: 60 06/16/2023: 55% 07/26/23: 70.6%  09/04/23: 73.75% Goal status: IN PROGRESS  2.  Patient (> 42 years old) will complete five times sit to stand test in < 15 seconds indicating an increased LE strength and improved balance. Baseline: 21.44 06/16/2023: 18.38 seconds no UE support 07/26/23: 15.55 seconds no UE support  09/01/23: 14.43 seconds no UE support Goal status: MET  3.  Patient will increase Berg Balance score by > 6 points to demonstrate decreased fall risk during functional activities Baseline: 1/30= 38/56 06/16/2023: 40/56 07/26/23: 42/56 09/01/23: 47/56 Goal status: MET  4.  Patient will increase 10 meter walk test to >1.34m/s as to improve gait speed for better community ambulation and to reduce fall risk. Baseline: 0.83 m/s 06/16/2023: 0.83 m/s (12 seconds) without AD 07/26/23: 0.96 m/s with no AD  09/01/23: 1.25 m/s with no AD (fast speed) Goal status: IN PROGRESS  5.  Patient will reduce timed up and go to <11 seconds to reduce  fall risk and demonstrate improved transfer/gait ability. Baseline: 14 sec  06/16/2023: 12.57 seconds no AD 07/26/23: 11.88 seconds no AD  09/01/23: 10.26 seconds no AD Goal status: MET  6.  Patient will increase dynamic gait index score to >19/24 as to demonstrate reduced fall risk and improved dynamic gait balance for better safety with community/home ambulation.  Baseline: 05/12/23: 17 06/16/2023: 17/24 07/26/23: 18/24 09/01/23:  21/24 Goal status: IN PROGRESS  7.  Patient will increase FGA score by > 4 points to demonstrate decreased fall risk during functional activities. Baseline: to perform at next visit; 09/06/2023= 23/30 Goal status: NEW  ASSESSMENT:  CLINICAL IMPRESSION:     FGA was performed per plan and determined to be next appropriate goal to capture some higher level balance deficits. He scored 23/30 and will benefit from ongoing dynamic balance interventions. He was later challenged with dynamic head mobility with standing and functional movement patterns - stepping over objects including agility ladder today.  Pt will continue to benefit from skilled therapy to address remaining deficits in order to improve overall QoL and return to PLOF.      OBJECTIVE IMPAIRMENTS: Abnormal gait, cardiopulmonary status limiting activity, decreased activity tolerance, decreased balance, decreased coordination, decreased endurance, decreased knowledge of use of DME, decreased mobility, difficulty walking, decreased ROM, decreased strength, hypomobility, impaired perceived functional ability, impaired flexibility, impaired sensation, improper body mechanics, postural dysfunction, and obesity.   ACTIVITY LIMITATIONS: carrying, lifting, bending, standing, squatting, stairs, transfers, and locomotion level  PARTICIPATION LIMITATIONS: cleaning, laundry, community activity, and occupation  PERSONAL FACTORS: Age and 1-2 comorbidities: obesity CHF are also affecting patient's functional outcome.    REHAB POTENTIAL: Good  CLINICAL DECISION MAKING: Stable/uncomplicated  EVALUATION COMPLEXITY: Moderate  PLAN:  PT FREQUENCY: 1-2x/week  PT DURATION: 12 weeks  PLANNED INTERVENTIONS: 97110-Therapeutic exercises, 97530- Therapeutic activity, 97112- Neuromuscular re-education, 97535- Self Care, 16109- Manual therapy, [phone]- Gait training, Patient/Family education, Balance training, Stair training, Taping, DME instructions, Cryotherapy, and Moist heat  PLAN FOR NEXT SESSION:  Dynamic balance interventions Fwd/back stepping over 1/2 foam roll vs hurdle with blaze pod dual-task  Static balance Tandem stance --> progress to eyes closed Dynamic gait training including: quick turning  stepping over obstacles agility ladder BLE strengthening    Ossie Blend, PT Physical Therapist - [facility]  09/06/23, 4:38 PM

## 2023-09-08 ENCOUNTER — Ambulatory Visit: Payer: PPO | Admitting: Physical Therapy

## 2023-09-08 DIAGNOSIS — R2681 Unsteadiness on feet: Secondary | ICD-10-CM | POA: Diagnosis not present

## 2023-09-08 DIAGNOSIS — R262 Difficulty in walking, not elsewhere classified: Secondary | ICD-10-CM

## 2023-09-08 DIAGNOSIS — M6281 Muscle weakness (generalized): Secondary | ICD-10-CM

## 2023-09-08 DIAGNOSIS — R2689 Other abnormalities of gait and mobility: Secondary | ICD-10-CM

## 2023-09-08 NOTE — Therapy (Signed)
 OUTPATIENT PHYSICAL THERAPY NEURO TREATMENT    Patient Name: Anthony Weber MRN: [id number] DOB:1946-02-19, 78 y.o., male Today's Date: 09/08/2023   PCP: Westley Hammers, MD  REFERRING PROVIDER: Mardell Shade, MD   END OF SESSION:   PT End of Session - 09/08/23 1012     Visit Number 33    Number of Visits 46    Date for PT Re-Evaluation 10/25/23    Progress Note Due on Visit 40    PT Start Time 1015    PT Stop Time 1055    PT Time Calculation (min) 40 min    Equipment Utilized During Treatment Gait belt    Activity Tolerance Patient tolerated treatment well    Behavior During Therapy WFL for tasks assessed/performed               Past Medical History:  Diagnosis Date   Depression    Hypertension    Hypothyroidism    Past Surgical History:  Procedure Laterality Date   COLONOSCOPY WITH PROPOFOL  N/A 03/03/2015   Procedure: COLONOSCOPY WITH PROPOFOL ;  Surgeon: Cassie Click, MD;  Location: [facility] ENDOSCOPY;  Service: Endoscopy;  Laterality: N/A;   EYE SURGERY     RIGHT HEART CATH N/A 08/08/2023   Procedure: RIGHT HEART CATH;  Surgeon: Mardell Shade, MD;  Location: [facility] INVASIVE CV LAB;  Service: Cardiovascular;  Laterality: N/A;   RIGHT/LEFT HEART CATH AND CORONARY ANGIOGRAPHY N/A 01/27/2021   Procedure: RIGHT/LEFT HEART CATH AND CORONARY ANGIOGRAPHY;  Surgeon: Sammy Crisp, MD;  Location: [facility] INVASIVE CV LAB;  Service: Cardiovascular;  Laterality: N/A;   TEE WITHOUT CARDIOVERSION N/A 02/05/2021   Procedure: TRANSESOPHAGEAL ECHOCARDIOGRAM (TEE);  Surgeon: Mardell Shade, MD;  Location: [facility] ENDOSCOPY;  Service: Cardiovascular;  Laterality: N/A;   Patient Active Problem List   Diagnosis Date Noted   Hyponatremia    Acute on chronic combined systolic and diastolic congestive heart failure (HCC)    Acute on chronic respiratory failure with hypoxia and hypercapnia (HCC)    MSSA bacteremia 02/03/2021   Central line infection    Pneumonia of  both lower lobes due to methicillin susceptible Staphylococcus aureus (MSSA) (HCC)    Cardiogenic shock (HCC) 01/29/2021   Primary hypertension    Acute HFrEF (heart failure with reduced ejection fraction) (HCC)    Acute CHF (congestive heart failure) (HCC) 01/18/2021    ONSET DATE: 3 years.   REFERRING DIAG:  Diagnosis  R26.89 (ICD-10-CM) - Imbalance    THERAPY DIAG:  Unsteadiness on feet  Difficulty in walking, not elsewhere classified  Muscle weakness (generalized)  Balance disorder  Imbalance  Rationale for Evaluation and Treatment: Rehabilitation  SUBJECTIVE:  SUBJECTIVE STATEMENT:     Patient reports that he is doing well. No trips or falls since last visit. No changes medically.    States compliant with HEP    Pt accompanied by: self  PERTINENT HISTORY:   From recent MD appointment. "Admitted to [facility] 01/2021  acute HFrEF. Echo  EF 25-30% from previous EF 60-65%. R/LHC w/ no CAD, moderately elevated left heart and pulmonary artery pressures, severely elevated right heart filling pressures, severely reduced Fick cardiac output/index.  Required intropes, pressor, and lasix  drip.  Transferred to [facility] for cardiogenic/septic shock, + MSSA bacteremia. Underwent TEE showing EF 25-30%, RV moderately down, severe biatrial enlargement and moderate MR, no valvular vegetation. Required addition of mexitiline to suppress PVCs. Hospitalization c/b AKI and hypoxic/hypercarbic respiratory failure.   Discharged from [facility] 03/11/21 after he completed IV antibiotics and rehab.    At last visit midodrine  stopped and GDMT started.    Sleep study 4/23 AHI 2   Here for routine f/u. Here for f/u with his wife. Says he is doing pretty well. 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 Walking 1 mile at the [place]  almost everyday. And riding stationary bike for 30 mins every night."  PAIN:  Are you having pain? No  PRECAUTIONS: None  RED FLAGS: None   WEIGHT BEARING RESTRICTIONS: No  FALLS: Has patient fallen in last 6 months? No  LIVING ENVIRONMENT: Lives with: lives with their spouse Lives in: House/apartment Stairs: Yes: Internal: 12 steps; on right going up and on left going up and External: 1 steps; on right going up and on left going up Has following equipment at home: Single point cane and does not use often    PLOF: Independent, Independent with basic ADLs, Independent with gait, and Independent with transfers  PATIENT GOALS: improve balance and strength in legs.   OBJECTIVE:  Note: Objective measures were completed at Evaluation unless otherwise noted.  DIAGNOSTIC FINDINGS:  None relevant   COGNITION: Overall cognitive status: Within functional limits for tasks assessed   SENSATION: Light touch: Impaired  and intermittent n/t in feet up to shins    COORDINATION: WFL   EDEMA:  Mild edema in bil LE   MUSCLE TONE: WFL   POSTURE: rounded shoulders and forward head  LOWER EXTREMITY ROM:     Grossly WFL  LOWER EXTREMITY MMT:    MMT Right Eval Left Eval  Hip flexion 4 4  Hip extension    Hip abduction 4+ 4+  Hip adduction 4 4  Hip internal rotation    Hip external rotation    Knee flexion 4+ 4+  Knee extension 4+ 4+  Ankle dorsiflexion 4+ 4+  Ankle plantarflexion    Ankle inversion    Ankle eversion    (Blank rows = not tested)  BED MOBILITY:  Sit to supine Complete Independence Supine to sit Complete Independence  TRANSFERS: Assistive device utilized: None  Sit to stand: Complete Independence Stand to sit: Complete Independence Chair to chair: Complete Independence Floor: unable to complete .  Requires UE support topush from thighs to stand   RAMP:  Level of Assistance: Complete Independence Assistive device utilized:  Ramp Comments:    CURB:  Level of Assistance: Modified independence Assistive device utilized: rail Curb Comments:   STAIRS: Level of Assistance: SBA Stair Negotiation Technique: Step to Pattern Alternating Pattern  with Single Rail on Right Number of Stairs: 4  Height of Stairs: 6  Comments: step to on descent  GAIT: Gait  pattern: lateral hip instability, trunk flexed, and wide BOS Distance walked: 915 Assistive device utilized: None Level of assistance: Complete Independence Comments: increased flexed posture with increased distance  FUNCTIONAL TESTS:  5 times sit to stand: 19.11 with UE support; 21.44 with UE support on knees.  Timed up and go (TUG): 15.64, 13.74 sec  6 minute walk test: 963ft  10 meter walk test: 12.43 and 11.68 Berg Balance Scale: TBD Dynamic Gait Index: TBD  PATIENT SURVEYS:  ABC scale 60                                                                                                                              TREATMENT DATE: DATE: 09/08/23   TA:   Gait belt donned throughout for pt safety, CGA provided unless specified otherwise   reciprocal foot tap on 10inch step x 12 bil  Static hold on 10in step 3 x 15 sec bil.   Weighted gait in Matrix.  Forward 7.5# x 1, 12.5# x 3.  Side stepping 7.5# x 2 bil  Reverse 12.5# x3.  Therapeutic rest break between.   Ladder agility activities -Reciprocal step - down and back- x 8 (initially performing fast but once instructed to slow down and focus on balance -side step down and back x 3 (min posterior LOB intermittently.)   Forward/reverse gait 53ft each x 3 bouts. Mild stagger intermittently on each repetition. Cues for decreased speed to improve balance control in reverse gait.      PATIENT EDUCATION: Education details: exercise technique, Pt educated throughout session about proper posture and technique with exercises. Improved exercise technique, movement at target joints, use of target muscles after min to  mod verbal, visual, tactile cues.  Person educated: Patient Education method: Archivist, Demo Education comprehension: verbalized understanding, returned demo  HOME EXERCISE PROGRAM:  Access Code: URL: [email or web address] Date: 06/23/2023 Prepared by: Carlen Chasten  Exercises - Doorway Pec Stretch at 60 Elevation  - 1 x daily - 7 x weekly - 2 sets - 1 minute hold - Standing Shoulder Row with Anchored Resistance  - 1 x daily - 7 x weekly - 3 sets - 10 reps - Low Trap Setting at Wall  - 1 x daily - 7 x weekly - 2 sets - 10 reps - Standing Tandem Balance with Counter Support  - 3 x weekly - 2 sets - 30 sec hold - Standing Near Stance in Corner with Eyes Closed  - 1 x daily - 7 x weekly - 2 sets - 30 seconds hold - Standing Single Leg Stance with Counter Support  - 3 x weekly - 3-5 sets - as long as possible hold - Standing March with Counter Support  - 3 x weekly - 3 sets - 10 reps - Side Stepping with Counter Support  - 1 x daily - 3 x weekly - 3 sets - 3 reps - Backward Walking with Counter Support  - 1  x daily - 7 x weekly - 3 sets - 5 reps   GOALS: Goals reviewed with patient? Yes  SHORT TERM GOALS: Target date: 09/13/2023    Patient will be independent in home exercise program to improve strength/mobility for better functional independence with ADLs. Baseline: 06/07/23: HEP provided  06/16/2023: pt reports consistent compliance with HEP 06/23/2023: updated HEP provided Goal status: IN PROGRESS   LONG TERM GOALS: Target date: 10/25/2023   Patient will increase ABC score to equal to or greater than 72  to demonstrate statistically significant improvement in mobility and quality of life.  Baseline: 60 06/16/2023: 55% 07/26/23: 70.6%  09/04/23: 73.75% Goal status: IN PROGRESS  2.  Patient (> 70 years old) will complete five times sit to stand test in < 15 seconds indicating an increased LE strength and improved balance. Baseline: 21.44 06/16/2023: 18.38  seconds no UE support 07/26/23: 15.55 seconds no UE support  09/01/23: 14.43 seconds no UE support Goal status: MET  3.  Patient will increase Berg Balance score by > 6 points to demonstrate decreased fall risk during functional activities Baseline: 1/30= 38/56 06/16/2023: 40/56 07/26/23: 42/56 09/01/23: 47/56 Goal status: MET  4.  Patient will increase 10 meter walk test to >1.81m/s as to improve gait speed for better community ambulation and to reduce fall risk. Baseline: 0.83 m/s 06/16/2023: 0.83 m/s (12 seconds) without AD 07/26/23: 0.96 m/s with no AD  09/01/23: 1.25 m/s with no AD (fast speed) Goal status: IN PROGRESS  5.  Patient will reduce timed up and go to <11 seconds to reduce fall risk and demonstrate improved transfer/gait ability. Baseline: 14 sec  06/16/2023: 12.57 seconds no AD 07/26/23: 11.88 seconds no AD  09/01/23: 10.26 seconds no AD Goal status: MET  6.  Patient will increase dynamic gait index score to >19/24 as to demonstrate reduced fall risk and improved dynamic gait balance for better safety with community/home ambulation.  Baseline: 05/12/23: 17 06/16/2023: 17/24 07/26/23: 18/24 09/01/23: 21/24 Goal status: IN PROGRESS  7.  Patient will increase FGA score by > 4 points to demonstrate decreased fall risk during functional activities. Baseline: to perform at next visit; 09/06/2023= 23/30 Goal status: NEW  ASSESSMENT:  CLINICAL IMPRESSION:     PT treatment focused on high level balance and functional strengthening on this day. Difficulty with reciprocal foot tap due to hip weakness and difficulty maintaining erect posture with end ROM into hip flexion. Instruction for decreased speed throughout session to improve gluteals and improve balance demand in various planes of movement.  Pt will continue to benefit from skilled therapy to address remaining deficits in order to improve overall QoL and return to PLOF.      OBJECTIVE IMPAIRMENTS: Abnormal gait, cardiopulmonary  status limiting activity, decreased activity tolerance, decreased balance, decreased coordination, decreased endurance, decreased knowledge of use of DME, decreased mobility, difficulty walking, decreased ROM, decreased strength, hypomobility, impaired perceived functional ability, impaired flexibility, impaired sensation, improper body mechanics, postural dysfunction, and obesity.   ACTIVITY LIMITATIONS: carrying, lifting, bending, standing, squatting, stairs, transfers, and locomotion level  PARTICIPATION LIMITATIONS: cleaning, laundry, community activity, and occupation  PERSONAL FACTORS: Age and 1-2 comorbidities: obesity CHF are also affecting patient's functional outcome.   REHAB POTENTIAL: Good  CLINICAL DECISION MAKING: Stable/uncomplicated  EVALUATION COMPLEXITY: Moderate  PLAN:  PT FREQUENCY: 1-2x/week  PT DURATION: 12 weeks  PLANNED INTERVENTIONS: 97110-Therapeutic exercises, 97530- Therapeutic activity, [id number]- Neuromuscular re-education, 97535- Self Care, [id number]- Manual therapy, [phone]- Gait training, Patient/Family education, Balance training, Stair training,  Taping, DME instructions, Cryotherapy, and Moist heat  PLAN FOR NEXT SESSION:  Dynamic balance interventions Fwd/back stepping over 1/2 foam roll vs hurdle with blaze pod dual-task  Dynamic gait training including: quick turning  stepping over obstacles agility ladder BLE strengthening   Aurora Lees PT, DPT  Physical Therapist - [facility]  12:03 PM 09/08/23

## 2023-09-09 ENCOUNTER — Telehealth: Payer: Self-pay | Admitting: Pharmacist

## 2023-09-09 ENCOUNTER — Other Ambulatory Visit: Payer: Self-pay

## 2023-09-09 MED ORDER — MOUNJARO 5 MG/0.5ML ~~LOC~~ SOAJ
5.0000 mg | SUBCUTANEOUS | 0 refills | Status: DC
  Filled 2023-09-09: qty 2, 28d supply, fill #0

## 2023-09-09 NOTE — Telephone Encounter (Signed)
 Patient tolerating Mounjaro  2.5 mg weekly. Will increase to 5 mg weekly.

## 2023-09-12 ENCOUNTER — Other Ambulatory Visit: Payer: Self-pay

## 2023-09-13 ENCOUNTER — Other Ambulatory Visit (HOSPITAL_COMMUNITY): Payer: Self-pay

## 2023-09-13 ENCOUNTER — Ambulatory Visit: Payer: PPO | Admitting: Physical Therapy

## 2023-09-13 ENCOUNTER — Ambulatory Visit: Payer: PPO | Attending: Internal Medicine

## 2023-09-13 DIAGNOSIS — M6281 Muscle weakness (generalized): Secondary | ICD-10-CM | POA: Insufficient documentation

## 2023-09-13 DIAGNOSIS — R2681 Unsteadiness on feet: Secondary | ICD-10-CM | POA: Insufficient documentation

## 2023-09-13 DIAGNOSIS — R262 Difficulty in walking, not elsewhere classified: Secondary | ICD-10-CM | POA: Insufficient documentation

## 2023-09-13 DIAGNOSIS — R2689 Other abnormalities of gait and mobility: Secondary | ICD-10-CM | POA: Diagnosis not present

## 2023-09-13 NOTE — Therapy (Signed)
 OUTPATIENT PHYSICAL THERAPY NEURO TREATMENT    Patient Name: Anthony Weber MRN: [id number] DOB:03-16-1946, 78 y.o., male Today's Date: 09/14/2023   PCP: Westley Hammers, MD  REFERRING PROVIDER: Mardell Shade, MD   END OF SESSION:   PT End of Session - 09/13/23 1542     Visit Number 34    Number of Visits 46    Date for PT Re-Evaluation 10/25/23    Progress Note Due on Visit 40    PT Start Time 1527    PT Stop Time 1610    PT Time Calculation (min) 43 min    Equipment Utilized During Treatment Gait belt    Activity Tolerance Patient tolerated treatment well    Behavior During Therapy WFL for tasks assessed/performed                Past Medical History:  Diagnosis Date   Depression    Hypertension    Hypothyroidism    Past Surgical History:  Procedure Laterality Date   COLONOSCOPY WITH PROPOFOL  N/A 03/03/2015   Procedure: COLONOSCOPY WITH PROPOFOL ;  Surgeon: Cassie Click, MD;  Location: [facility] ENDOSCOPY;  Service: Endoscopy;  Laterality: N/A;   EYE SURGERY     RIGHT HEART CATH N/A 08/08/2023   Procedure: RIGHT HEART CATH;  Surgeon: Mardell Shade, MD;  Location: [facility] INVASIVE CV LAB;  Service: Cardiovascular;  Laterality: N/A;   RIGHT/LEFT HEART CATH AND CORONARY ANGIOGRAPHY N/A 01/27/2021   Procedure: RIGHT/LEFT HEART CATH AND CORONARY ANGIOGRAPHY;  Surgeon: Sammy Crisp, MD;  Location: [facility] INVASIVE CV LAB;  Service: Cardiovascular;  Laterality: N/A;   TEE WITHOUT CARDIOVERSION N/A 02/05/2021   Procedure: TRANSESOPHAGEAL ECHOCARDIOGRAM (TEE);  Surgeon: Mardell Shade, MD;  Location: [facility] ENDOSCOPY;  Service: Cardiovascular;  Laterality: N/A;   Patient Active Problem List   Diagnosis Date Noted   Hyponatremia    Acute on chronic combined systolic and diastolic congestive heart failure (HCC)    Acute on chronic respiratory failure with hypoxia and hypercapnia (HCC)    MSSA bacteremia 02/03/2021   Central line infection    Pneumonia of  both lower lobes due to methicillin susceptible Staphylococcus aureus (MSSA) (HCC)    Cardiogenic shock (HCC) 01/29/2021   Primary hypertension    Acute HFrEF (heart failure with reduced ejection fraction) (HCC)    Acute CHF (congestive heart failure) (HCC) 01/18/2021    ONSET DATE: 3 years.   REFERRING DIAG:  Diagnosis  R26.89 (ICD-10-CM) - Imbalance    THERAPY DIAG:  Unsteadiness on feet  Difficulty in walking, not elsewhere classified  Muscle weakness (generalized)  Balance disorder  Imbalance  Rationale for Evaluation and Treatment: Rehabilitation  SUBJECTIVE:  SUBJECTIVE STATEMENT:     Patient reports doing okay today- no falls and no pain today.     States compliant with HEP    Pt accompanied by: self  PERTINENT HISTORY:   From recent MD appointment. "Admitted to [facility] 01/2021  acute HFrEF. Echo  EF 25-30% from previous EF 60-65%. R/LHC w/ no CAD, moderately elevated left heart and pulmonary artery pressures, severely elevated right heart filling pressures, severely reduced Fick cardiac output/index.  Required intropes, pressor, and lasix  drip.  Transferred to [facility] for cardiogenic/septic shock, + MSSA bacteremia. Underwent TEE showing EF 25-30%, RV moderately down, severe biatrial enlargement and moderate MR, no valvular vegetation. Required addition of mexitiline to suppress PVCs. Hospitalization c/b AKI and hypoxic/hypercarbic respiratory failure.   Discharged from [facility] 03/11/21 after he completed IV antibiotics and rehab.    At last visit midodrine  stopped and GDMT started.    Sleep study 4/23 AHI 2   Here for routine f/u. Here for f/u with his wife. Says he is doing pretty well. 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 Walking 1 mile at the [place] almost everyday. And riding  stationary bike for 30 mins every night."  PAIN:  Are you having pain? No  PRECAUTIONS: None  RED FLAGS: None   WEIGHT BEARING RESTRICTIONS: No  FALLS: Has patient fallen in last 6 months? No  LIVING ENVIRONMENT: Lives with: lives with their spouse Lives in: House/apartment Stairs: Yes: Internal: 12 steps; on right going up and on left going up and External: 1 steps; on right going up and on left going up Has following equipment at home: Single point cane and does not use often    PLOF: Independent, Independent with basic ADLs, Independent with gait, and Independent with transfers  PATIENT GOALS: improve balance and strength in legs.   OBJECTIVE:  Note: Objective measures were completed at Evaluation unless otherwise noted.  DIAGNOSTIC FINDINGS:  None relevant   COGNITION: Overall cognitive status: Within functional limits for tasks assessed   SENSATION: Light touch: Impaired  and intermittent n/t in feet up to shins    COORDINATION: WFL   EDEMA:  Mild edema in bil LE   MUSCLE TONE: WFL   POSTURE: rounded shoulders and forward head  LOWER EXTREMITY ROM:     Grossly WFL  LOWER EXTREMITY MMT:    MMT Right Eval Left Eval  Hip flexion 4 4  Hip extension    Hip abduction 4+ 4+  Hip adduction 4 4  Hip internal rotation    Hip external rotation    Knee flexion 4+ 4+  Knee extension 4+ 4+  Ankle dorsiflexion 4+ 4+  Ankle plantarflexion    Ankle inversion    Ankle eversion    (Blank rows = not tested)  BED MOBILITY:  Sit to supine Complete Independence Supine to sit Complete Independence  TRANSFERS: Assistive device utilized: None  Sit to stand: Complete Independence Stand to sit: Complete Independence Chair to chair: Complete Independence Floor: unable to complete .  Requires UE support topush from thighs to stand   RAMP:  Level of Assistance: Complete Independence Assistive device utilized:  Ramp Comments:   CURB:  Level of Assistance:  Modified independence Assistive device utilized: rail Curb Comments:   STAIRS: Level of Assistance: SBA Stair Negotiation Technique: Step to Pattern Alternating Pattern  with Single Rail on Right Number of Stairs: 4  Height of Stairs: 6  Comments: step to on descent  GAIT: Gait pattern: lateral hip instability, trunk  flexed, and wide BOS Distance walked: 915 Assistive device utilized: None Level of assistance: Complete Independence Comments: increased flexed posture with increased distance  FUNCTIONAL TESTS:  5 times sit to stand: 19.11 with UE support; 21.44 with UE support on knees.  Timed up and go (TUG): 15.64, 13.74 sec  6 minute walk test: 931ft  10 meter walk test: 12.43 and 11.68 Berg Balance Scale: TBD Dynamic Gait Index: TBD  PATIENT SURVEYS:  ABC scale 60                                                                                                                              TREATMENT DATE: DATE: 09/14/23   TA:   Gait belt donned throughout for pt safety, CGA provided unless specified otherwise  Sit to stand + UE raises (lifting 3kg ball overhead) x 12 reps reciprocal foot tap on 6 in step x  12 bil then progressed to 8 in x 12 alt LE without UE Support    Resistive walking in clinic with 3# AW x 300 feet with VC for step length.  Forward/reverse gait 64ft  forward/10 feet bwd x 10 rounds outs. Mild stagger intermittently on each repetition. Cues for decreased speed to improve balance control in reverse gait.  Resistive gait using Matrix cable system:  Forward 17.5# x 5- most difficulty with retro stepping- Min assist at time to control Side stepping 7.5# x 5 bil    Therapeutic rest break between.         PATIENT EDUCATION: Education details: exercise technique, Pt educated throughout session about proper posture and technique with exercises. Improved exercise technique, movement at target joints, use of target muscles after min to mod verbal,  visual, tactile cues.  Person educated: Patient Education method: Archivist, Demo Education comprehension: verbalized understanding, returned demo  HOME EXERCISE PROGRAM:  Access Code: URL: [email or web address] Date: 06/23/2023 Prepared by: Carlen Chasten  Exercises - Doorway Pec Stretch at 60 Elevation  - 1 x daily - 7 x weekly - 2 sets - 1 minute hold - Standing Shoulder Row with Anchored Resistance  - 1 x daily - 7 x weekly - 3 sets - 10 reps - Low Trap Setting at Wall  - 1 x daily - 7 x weekly - 2 sets - 10 reps - Standing Tandem Balance with Counter Support  - 3 x weekly - 2 sets - 30 sec hold - Standing Near Stance in Corner with Eyes Closed  - 1 x daily - 7 x weekly - 2 sets - 30 seconds hold - Standing Single Leg Stance with Counter Support  - 3 x weekly - 3-5 sets - as long as possible hold - Standing March with Counter Support  - 3 x weekly - 3 sets - 10 reps - Side Stepping with Counter Support  - 1 x daily - 3 x weekly - 3 sets - 3 reps - Backward Walking with Counter Support  -  1 x daily - 7 x weekly - 3 sets - 5 reps   GOALS: Goals reviewed with patient? Yes  SHORT TERM GOALS: Target date: 09/13/2023    Patient will be independent in home exercise program to improve strength/mobility for better functional independence with ADLs. Baseline: 06/07/23: HEP provided  06/16/2023: pt reports consistent compliance with HEP 06/23/2023: updated HEP provided Goal status: IN PROGRESS   LONG TERM GOALS: Target date: 10/25/2023   Patient will increase ABC score to equal to or greater than 72  to demonstrate statistically significant improvement in mobility and quality of life.  Baseline: 60 06/16/2023: 55% 07/26/23: 70.6%  09/04/23: 73.75% Goal status: IN PROGRESS  2.  Patient (> 36 years old) will complete five times sit to stand test in < 15 seconds indicating an increased LE strength and improved balance. Baseline: 21.44 06/16/2023: 18.38 seconds no  UE support 07/26/23: 15.55 seconds no UE support  09/01/23: 14.43 seconds no UE support Goal status: MET  3.  Patient will increase Berg Balance score by > 6 points to demonstrate decreased fall risk during functional activities Baseline: 1/30= 38/56 06/16/2023: 40/56 07/26/23: 42/56 09/01/23: 47/56 Goal status: MET  4.  Patient will increase 10 meter walk test to >1.36m/s as to improve gait speed for better community ambulation and to reduce fall risk. Baseline: 0.83 m/s 06/16/2023: 0.83 m/s (12 seconds) without AD 07/26/23: 0.96 m/s with no AD  09/01/23: 1.25 m/s with no AD (fast speed) Goal status: IN PROGRESS  5.  Patient will reduce timed up and go to <11 seconds to reduce fall risk and demonstrate improved transfer/gait ability. Baseline: 14 sec  06/16/2023: 12.57 seconds no AD 07/26/23: 11.88 seconds no AD  09/01/23: 10.26 seconds no AD Goal status: MET  6.  Patient will increase dynamic gait index score to >19/24 as to demonstrate reduced fall risk and improved dynamic gait balance for better safety with community/home ambulation.  Baseline: 05/12/23: 17 06/16/2023: 17/24 07/26/23: 18/24 09/01/23: 21/24 Goal status: IN PROGRESS  7.  Patient will increase FGA score by > 4 points to demonstrate decreased fall risk during functional activities. Baseline: to perform at next visit; 09/06/2023= 23/30 Goal status: NEW  ASSESSMENT:  CLINICAL IMPRESSION:     Treatment continues with balance training. He continues to have some imbalance with SLS activities and difficulty coordination bwd walking with unsteadiness noted with walking backward and resistive gait. He did improve some with VC and practice. Able to walk 2 laps around the clinic without LOB- min VC to increase step length for best quality gait.  Pt will continue to benefit from skilled therapy to address remaining deficits in order to improve overall QoL and return to PLOF.      OBJECTIVE IMPAIRMENTS: Abnormal gait, cardiopulmonary  status limiting activity, decreased activity tolerance, decreased balance, decreased coordination, decreased endurance, decreased knowledge of use of DME, decreased mobility, difficulty walking, decreased ROM, decreased strength, hypomobility, impaired perceived functional ability, impaired flexibility, impaired sensation, improper body mechanics, postural dysfunction, and obesity.   ACTIVITY LIMITATIONS: carrying, lifting, bending, standing, squatting, stairs, transfers, and locomotion level  PARTICIPATION LIMITATIONS: cleaning, laundry, community activity, and occupation  PERSONAL FACTORS: Age and 1-2 comorbidities: obesity CHF are also affecting patient's functional outcome.   REHAB POTENTIAL: Good  CLINICAL DECISION MAKING: Stable/uncomplicated  EVALUATION COMPLEXITY: Moderate  PLAN:  PT FREQUENCY: 1-2x/week  PT DURATION: 12 weeks  PLANNED INTERVENTIONS: 97110-Therapeutic exercises, 97530- Therapeutic activity, [id number]- Neuromuscular re-education, 97535- Self Care, [id number]- Manual therapy, [id number]- Gait training,  Patient/Family education, Balance training, Stair training, Taping, DME instructions, Cryotherapy, and Moist heat  PLAN FOR NEXT SESSION:  Dynamic balance interventions Fwd/back stepping over 1/2 foam roll vs hurdle with blaze pod dual-task  Dynamic gait training including: quick turning  stepping over obstacles agility ladder BLE strengthening   Ossie Blend, PT Physical Therapist - [facility]  [facility]  3:15 PM 09/14/23

## 2023-09-15 ENCOUNTER — Ambulatory Visit: Payer: PPO | Admitting: Physical Therapy

## 2023-09-15 DIAGNOSIS — M6281 Muscle weakness (generalized): Secondary | ICD-10-CM

## 2023-09-15 DIAGNOSIS — R2681 Unsteadiness on feet: Secondary | ICD-10-CM

## 2023-09-15 DIAGNOSIS — R262 Difficulty in walking, not elsewhere classified: Secondary | ICD-10-CM

## 2023-09-15 DIAGNOSIS — R2689 Other abnormalities of gait and mobility: Secondary | ICD-10-CM

## 2023-09-15 NOTE — Therapy (Unsigned)
 OUTPATIENT PHYSICAL THERAPY NEURO TREATMENT    Patient Name: Anthony Weber MRN: [id number] DOB:1946/02/14, 78 y.o., male Today's Date: 09/15/2023   PCP: Westley Hammers, MD  REFERRING PROVIDER: Mardell Shade, MD   END OF SESSION:   PT End of Session - 09/15/23 1022     Visit Number 36    Number of Visits 46    Date for PT Re-Evaluation 10/25/23    Progress Note Due on Visit 40    Equipment Utilized During Treatment Gait belt    Activity Tolerance Patient tolerated treatment well    Behavior During Therapy [facility] for tasks assessed/performed                Past Medical History:  Diagnosis Date   Depression    Hypertension    Hypothyroidism    Past Surgical History:  Procedure Laterality Date   COLONOSCOPY WITH PROPOFOL  N/A 03/03/2015   Procedure: COLONOSCOPY WITH PROPOFOL ;  Surgeon: Cassie Click, MD;  Location: [facility] ENDOSCOPY;  Service: Endoscopy;  Laterality: N/A;   EYE SURGERY     RIGHT HEART CATH N/A 08/08/2023   Procedure: RIGHT HEART CATH;  Surgeon: Mardell Shade, MD;  Location: [facility] INVASIVE CV LAB;  Service: Cardiovascular;  Laterality: N/A;   RIGHT/LEFT HEART CATH AND CORONARY ANGIOGRAPHY N/A 01/27/2021   Procedure: RIGHT/LEFT HEART CATH AND CORONARY ANGIOGRAPHY;  Surgeon: Sammy Crisp, MD;  Location: [facility] INVASIVE CV LAB;  Service: Cardiovascular;  Laterality: N/A;   TEE WITHOUT CARDIOVERSION N/A 02/05/2021   Procedure: TRANSESOPHAGEAL ECHOCARDIOGRAM (TEE);  Surgeon: Mardell Shade, MD;  Location: [facility] ENDOSCOPY;  Service: Cardiovascular;  Laterality: N/A;   Patient Active Problem List   Diagnosis Date Noted   Hyponatremia    Acute on chronic combined systolic and diastolic congestive heart failure (HCC)    Acute on chronic respiratory failure with hypoxia and hypercapnia (HCC)    MSSA bacteremia 02/03/2021   Central line infection    Pneumonia of both lower lobes due to methicillin susceptible Staphylococcus aureus (MSSA) (HCC)     Cardiogenic shock (HCC) 01/29/2021   Primary hypertension    Acute HFrEF (heart failure with reduced ejection fraction) (HCC)    Acute CHF (congestive heart failure) (HCC) 01/18/2021    ONSET DATE: 3 years.   REFERRING DIAG:  Diagnosis  R26.89 (ICD-10-CM) - Imbalance    THERAPY DIAG:  Unsteadiness on feet  Balance disorder  Difficulty in walking, not elsewhere classified  Imbalance  Muscle weakness (generalized)  Rationale for Evaluation and Treatment: Rehabilitation  SUBJECTIVE:  SUBJECTIVE STATEMENT:     Patient reports doing okay today- no falls and no pain today.     States compliant with HEP    Pt accompanied by: self  PERTINENT HISTORY:   From recent MD appointment. "Admitted to [facility] 01/2021  acute HFrEF. Echo  EF 25-30% from previous EF 60-65%. R/LHC w/ no CAD, moderately elevated left heart and pulmonary artery pressures, severely elevated right heart filling pressures, severely reduced Fick cardiac output/index.  Required intropes, pressor, and lasix  drip.  Transferred to [facility] for cardiogenic/septic shock, + MSSA bacteremia. Underwent TEE showing EF 25-30%, RV moderately down, severe biatrial enlargement and moderate MR, no valvular vegetation. Required addition of mexitiline to suppress PVCs. Hospitalization c/b AKI and hypoxic/hypercarbic respiratory failure.   Discharged from [facility] 03/11/21 after he completed IV antibiotics and rehab.    At last visit midodrine  stopped and GDMT started.    Sleep study 4/23 AHI 2   Here for routine f/u. Here for f/u with his wife. Says he is doing pretty well. 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 Walking 1 mile at the [place] almost everyday. And riding stationary bike for 30 mins every night."  PAIN:  Are you having pain?  No  PRECAUTIONS: None  RED FLAGS: None   WEIGHT BEARING RESTRICTIONS: No  FALLS: Has patient fallen in last 6 months? No  LIVING ENVIRONMENT: Lives with: lives with their spouse Lives in: House/apartment Stairs: Yes: Internal: 12 steps; on right going up and on left going up and External: 1 steps; on right going up and on left going up Has following equipment at home: Single point cane and does not use often    PLOF: Independent, Independent with basic ADLs, Independent with gait, and Independent with transfers  PATIENT GOALS: improve balance and strength in legs.   OBJECTIVE:  Note: Objective measures were completed at Evaluation unless otherwise noted.  DIAGNOSTIC FINDINGS:  None relevant   COGNITION: Overall cognitive status: Within functional limits for tasks assessed   SENSATION: Light touch: Impaired  and intermittent n/t in feet up to shins    COORDINATION: WFL   EDEMA:  Mild edema in bil LE   MUSCLE TONE: WFL   POSTURE: rounded shoulders and forward head  LOWER EXTREMITY ROM:     Grossly WFL  LOWER EXTREMITY MMT:    MMT Right Eval Left Eval  Hip flexion 4 4  Hip extension    Hip abduction 4+ 4+  Hip adduction 4 4  Hip internal rotation    Hip external rotation    Knee flexion 4+ 4+  Knee extension 4+ 4+  Ankle dorsiflexion 4+ 4+  Ankle plantarflexion    Ankle inversion    Ankle eversion    (Blank rows = not tested)  BED MOBILITY:  Sit to supine Complete Independence Supine to sit Complete Independence  TRANSFERS: Assistive device utilized: None  Sit to stand: Complete Independence Stand to sit: Complete Independence Chair to chair: Complete Independence Floor: unable to complete .  Requires UE support topush from thighs to stand   RAMP:  Level of Assistance: Complete Independence Assistive device utilized:  Ramp Comments:   CURB:  Level of Assistance: Modified independence Assistive device utilized: rail Curb Comments:    STAIRS: Level of Assistance: SBA Stair Negotiation Technique: Step to Pattern Alternating Pattern  with Single Rail on Right Number of Stairs: 4  Height of Stairs: 6  Comments: step to on descent  GAIT: Gait pattern: lateral hip instability, trunk  flexed, and wide BOS Distance walked: 915 Assistive device utilized: None Level of assistance: Complete Independence Comments: increased flexed posture with increased distance  FUNCTIONAL TESTS:  5 times sit to stand: 19.11 with UE support; 21.44 with UE support on knees.  Timed up and go (TUG): 15.64, 13.74 sec  6 minute walk test: 930ft  10 meter walk test: 12.43 and 11.68 Berg Balance Scale: TBD Dynamic Gait Index: TBD  PATIENT SURVEYS:  ABC scale 60                                                                                                                              TREATMENT DATE: DATE: 09/15/23   TA:   Step up and over with light BUE support on rail with only 1 foot on 6inch step x 10 bil. Then completed without UE support on 5inch step x 6 bil increased difficulty leading with LLE compared to RLE without UE support  Side stepping with squat while feet apart x 3 laps.    Gait belt donned throughout for pt safety, CGA provided unless specified otherwise  Sit to stand + UE raises (lifting 3kg ball overhead) x 12 reps reciprocal foot tap on 6 in step x  12 bil then progressed to 8 in x 12 alt LE without UE Support    Resistive walking in clinic with 3# AW x 300 feet with VC for step length.  Forward/reverse gait 13ft  forward/10 feet bwd x 10 rounds outs. Mild stagger intermittently on each repetition. Cues for decreased speed to improve balance control in reverse gait.  Resistive gait using Matrix cable system:  Forward 17.5# x 5- most difficulty with retro stepping- Min assist at time to control Side stepping 7.5# x 5 bil    Therapeutic rest break between.         PATIENT EDUCATION: Education details:  exercise technique, Pt educated throughout session about proper posture and technique with exercises. Improved exercise technique, movement at target joints, use of target muscles after min to mod verbal, visual, tactile cues.  Person educated: Patient Education method: Archivist, Demo Education comprehension: verbalized understanding, returned demo  HOME EXERCISE PROGRAM:  Access Code: URL: [email or web address] Date: 06/23/2023 Prepared by: Carlen Chasten  Exercises - Doorway Pec Stretch at 60 Elevation  - 1 x daily - 7 x weekly - 2 sets - 1 minute hold - Standing Shoulder Row with Anchored Resistance  - 1 x daily - 7 x weekly - 3 sets - 10 reps - Low Trap Setting at Wall  - 1 x daily - 7 x weekly - 2 sets - 10 reps - Standing Tandem Balance with Counter Support  - 3 x weekly - 2 sets - 30 sec hold - Standing Near Stance in Corner with Eyes Closed  - 1 x daily - 7 x weekly - 2 sets - 30 seconds hold - Standing Single Leg Stance with Counter Support  -  3 x weekly - 3-5 sets - as long as possible hold - Standing March with Counter Support  - 3 x weekly - 3 sets - 10 reps - Side Stepping with Counter Support  - 1 x daily - 3 x weekly - 3 sets - 3 reps - Backward Walking with Counter Support  - 1 x daily - 7 x weekly - 3 sets - 5 reps   GOALS: Goals reviewed with patient? Yes  SHORT TERM GOALS: Target date: 09/13/2023    Patient will be independent in home exercise program to improve strength/mobility for better functional independence with ADLs. Baseline: 06/07/23: HEP provided  06/16/2023: pt reports consistent compliance with HEP 06/23/2023: updated HEP provided Goal status: IN PROGRESS   LONG TERM GOALS: Target date: 10/25/2023   Patient will increase ABC score to equal to or greater than 72  to demonstrate statistically significant improvement in mobility and quality of life.  Baseline: 60 06/16/2023: 55% 07/26/23: 70.6%  09/04/23: 73.75% Goal status:  IN PROGRESS  2.  Patient (> 26 years old) will complete five times sit to stand test in < 15 seconds indicating an increased LE strength and improved balance. Baseline: 21.44 06/16/2023: 18.38 seconds no UE support 07/26/23: 15.55 seconds no UE support  09/01/23: 14.43 seconds no UE support Goal status: MET  3.  Patient will increase Berg Balance score by > 6 points to demonstrate decreased fall risk during functional activities Baseline: 1/30= 38/56 06/16/2023: 40/56 07/26/23: 42/56 09/01/23: 47/56 Goal status: MET  4.  Patient will increase 10 meter walk test to >1.64m/s as to improve gait speed for better community ambulation and to reduce fall risk. Baseline: 0.83 m/s 06/16/2023: 0.83 m/s (12 seconds) without AD 07/26/23: 0.96 m/s with no AD  09/01/23: 1.25 m/s with no AD (fast speed) Goal status: IN PROGRESS  5.  Patient will reduce timed up and go to <11 seconds to reduce fall risk and demonstrate improved transfer/gait ability. Baseline: 14 sec  06/16/2023: 12.57 seconds no AD 07/26/23: 11.88 seconds no AD  09/01/23: 10.26 seconds no AD Goal status: MET  6.  Patient will increase dynamic gait index score to >19/24 as to demonstrate reduced fall risk and improved dynamic gait balance for better safety with community/home ambulation.  Baseline: 05/12/23: 17 06/16/2023: 17/24 07/26/23: 18/24 09/01/23: 21/24 Goal status: IN PROGRESS  7.  Patient will increase FGA score by > 4 points to demonstrate decreased fall risk during functional activities. Baseline: to perform at next visit; 09/06/2023= 23/30 Goal status: NEW  ASSESSMENT:  CLINICAL IMPRESSION:     Treatment continues with balance training. He continues to have some imbalance with SLS activities and difficulty coordination bwd walking with unsteadiness noted with walking backward and resistive gait. He did improve some with VC and practice. Able to walk 2 laps around the clinic without LOB- min VC to increase step length for best  quality gait.  Pt will continue to benefit from skilled therapy to address remaining deficits in order to improve overall QoL and return to PLOF.      OBJECTIVE IMPAIRMENTS: Abnormal gait, cardiopulmonary status limiting activity, decreased activity tolerance, decreased balance, decreased coordination, decreased endurance, decreased knowledge of use of DME, decreased mobility, difficulty walking, decreased ROM, decreased strength, hypomobility, impaired perceived functional ability, impaired flexibility, impaired sensation, improper body mechanics, postural dysfunction, and obesity.   ACTIVITY LIMITATIONS: carrying, lifting, bending, standing, squatting, stairs, transfers, and locomotion level  PARTICIPATION LIMITATIONS: cleaning, laundry, community activity, and occupation  PERSONAL  FACTORS: Age and 1-2 comorbidities: obesity CHF are also affecting patient's functional outcome.   REHAB POTENTIAL: Good  CLINICAL DECISION MAKING: Stable/uncomplicated  EVALUATION COMPLEXITY: Moderate  PLAN:  PT FREQUENCY: 1-2x/week  PT DURATION: 12 weeks  PLANNED INTERVENTIONS: 97110-Therapeutic exercises, 97530- Therapeutic activity, 97112- Neuromuscular re-education, 97535- Self Care, [id number]- Manual therapy, [phone]- Gait training, Patient/Family education, Balance training, Stair training, Taping, DME instructions, Cryotherapy, and Moist heat  PLAN FOR NEXT SESSION:  Dynamic balance interventions Fwd/back stepping over 1/2 foam roll vs hurdle with blaze pod dual-task  Dynamic gait training including: quick turning  stepping over obstacles agility ladder BLE strengthening   Ossie Blend, PT Physical Therapist - [facility]  [facility]  10:26 AM 09/15/23

## 2023-09-16 ENCOUNTER — Other Ambulatory Visit (HOSPITAL_COMMUNITY): Payer: Self-pay

## 2023-09-16 DIAGNOSIS — I10 Essential (primary) hypertension: Secondary | ICD-10-CM | POA: Diagnosis not present

## 2023-09-16 DIAGNOSIS — E785 Hyperlipidemia, unspecified: Secondary | ICD-10-CM | POA: Diagnosis not present

## 2023-09-16 DIAGNOSIS — N390 Urinary tract infection, site not specified: Secondary | ICD-10-CM | POA: Diagnosis not present

## 2023-09-17 LAB — LAB REPORT - SCANNED: EGFR: 74

## 2023-09-19 ENCOUNTER — Other Ambulatory Visit (HOSPITAL_COMMUNITY): Payer: Self-pay

## 2023-09-19 ENCOUNTER — Other Ambulatory Visit: Payer: Self-pay | Admitting: Internal Medicine

## 2023-09-20 ENCOUNTER — Ambulatory Visit: Payer: PPO

## 2023-09-20 ENCOUNTER — Ambulatory Visit: Payer: PPO | Admitting: Physical Therapy

## 2023-09-20 DIAGNOSIS — E038 Other specified hypothyroidism: Secondary | ICD-10-CM | POA: Diagnosis not present

## 2023-09-20 DIAGNOSIS — E785 Hyperlipidemia, unspecified: Secondary | ICD-10-CM | POA: Diagnosis not present

## 2023-09-20 DIAGNOSIS — I10 Essential (primary) hypertension: Secondary | ICD-10-CM | POA: Diagnosis not present

## 2023-09-22 ENCOUNTER — Other Ambulatory Visit: Payer: Self-pay

## 2023-09-22 ENCOUNTER — Ambulatory Visit: Payer: PPO | Admitting: Physical Therapy

## 2023-09-22 ENCOUNTER — Other Ambulatory Visit (HOSPITAL_COMMUNITY): Payer: Self-pay

## 2023-09-22 DIAGNOSIS — R262 Difficulty in walking, not elsewhere classified: Secondary | ICD-10-CM

## 2023-09-22 DIAGNOSIS — R2681 Unsteadiness on feet: Secondary | ICD-10-CM | POA: Diagnosis not present

## 2023-09-22 DIAGNOSIS — M6281 Muscle weakness (generalized): Secondary | ICD-10-CM

## 2023-09-22 DIAGNOSIS — R2689 Other abnormalities of gait and mobility: Secondary | ICD-10-CM

## 2023-09-22 MED ORDER — TORSEMIDE 20 MG PO TABS
40.0000 mg | ORAL_TABLET | Freq: Every day | ORAL | 3 refills | Status: AC
Start: 2023-09-22 — End: ?
  Filled 2023-09-22 – 2023-09-27 (×2): qty 180, 90d supply, fill #0
  Filled 2023-12-22: qty 180, 90d supply, fill #1

## 2023-09-22 NOTE — Therapy (Signed)
 OUTPATIENT PHYSICAL THERAPY NEURO TREATMENT    Patient Name: Anthony Weber MRN: [id number] DOB:07/30/1945, 78 y.o., male Today's Date: 09/22/2023   PCP: Westley Hammers, MD  REFERRING PROVIDER: Mardell Shade, MD   END OF SESSION:   PT End of Session - 09/22/23 1037     Visit Number 36    Number of Visits 46    Date for PT Re-Evaluation 10/25/23    Progress Note Due on Visit 40    PT Start Time 1020    PT Stop Time 1100    PT Time Calculation (min) 40 min    Equipment Utilized During Treatment Gait belt    Activity Tolerance Patient tolerated treatment well    Behavior During Therapy WFL for tasks assessed/performed             Past Medical History:  Diagnosis Date   Depression    Hypertension    Hypothyroidism    Past Surgical History:  Procedure Laterality Date   COLONOSCOPY WITH PROPOFOL  N/A 03/03/2015   Procedure: COLONOSCOPY WITH PROPOFOL ;  Surgeon: Cassie Click, MD;  Location: [facility] ENDOSCOPY;  Service: Endoscopy;  Laterality: N/A;   EYE SURGERY     RIGHT HEART CATH N/A 08/08/2023   Procedure: RIGHT HEART CATH;  Surgeon: Mardell Shade, MD;  Location: [facility] INVASIVE CV LAB;  Service: Cardiovascular;  Laterality: N/A;   RIGHT/LEFT HEART CATH AND CORONARY ANGIOGRAPHY N/A 01/27/2021   Procedure: RIGHT/LEFT HEART CATH AND CORONARY ANGIOGRAPHY;  Surgeon: Sammy Crisp, MD;  Location: [facility] INVASIVE CV LAB;  Service: Cardiovascular;  Laterality: N/A;   TEE WITHOUT CARDIOVERSION N/A 02/05/2021   Procedure: TRANSESOPHAGEAL ECHOCARDIOGRAM (TEE);  Surgeon: Mardell Shade, MD;  Location: [facility] ENDOSCOPY;  Service: Cardiovascular;  Laterality: N/A;   Patient Active Problem List   Diagnosis Date Noted   Hyponatremia    Acute on chronic combined systolic and diastolic congestive heart failure (HCC)    Acute on chronic respiratory failure with hypoxia and hypercapnia (HCC)    MSSA bacteremia 02/03/2021   Central line infection    Pneumonia of both  lower lobes due to methicillin susceptible Staphylococcus aureus (MSSA) (HCC)    Cardiogenic shock (HCC) 01/29/2021   Primary hypertension    Acute HFrEF (heart failure with reduced ejection fraction) (HCC)    Acute CHF (congestive heart failure) (HCC) 01/18/2021    ONSET DATE: 3 years.   REFERRING DIAG:  Diagnosis  R26.89 (ICD-10-CM) - Imbalance    THERAPY DIAG:  Unsteadiness on feet  Balance disorder  Difficulty in walking, not elsewhere classified  Imbalance  Muscle weakness (generalized)  Rationale for Evaluation and Treatment: Rehabilitation  SUBJECTIVE:  SUBJECTIVE STATEMENT:     Patient reports doing okay today- no falls and no pain today.  States that he feels like he has lost a little strength since since starting Ozempic. Reports that he might want to stop taking, but his PCP wants him to reduce weight to reduce risk of MI and CVA      States compliant with HEP    Pt accompanied by: self  PERTINENT HISTORY:   From recent MD appointment. Admitted to [facility] 01/2021  acute HFrEF. Echo  EF 25-30% from previous EF 60-65%. R/LHC w/ no CAD, moderately elevated left heart and pulmonary artery pressures, severely elevated right heart filling pressures, severely reduced Fick cardiac output/index.  Required intropes, pressor, and lasix  drip.  Transferred to [facility] for cardiogenic/septic shock, + MSSA bacteremia. Underwent TEE showing EF 25-30%, RV moderately down, severe biatrial enlargement and moderate MR, no valvular vegetation. Required addition of mexitiline to suppress PVCs. Hospitalization c/b AKI and hypoxic/hypercarbic respiratory failure.   Discharged from [facility] 03/11/21 after he completed IV antibiotics and rehab.    At last visit midodrine  stopped and GDMT started.    Sleep study  4/23 AHI 2   Here for routine f/u. Here for f/u with his wife. Says he is doing pretty well. 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 Walking 1 mile at the [place] almost everyday. And riding stationary bike for 30 mins every night.  PAIN:  Are you having pain? No  PRECAUTIONS: None  RED FLAGS: None   WEIGHT BEARING RESTRICTIONS: No  FALLS: Has patient fallen in last 6 months? No  LIVING ENVIRONMENT: Lives with: lives with their spouse Lives in: House/apartment Stairs: Yes: Internal: 12 steps; on right going up and on left going up and External: 1 steps; on right going up and on left going up Has following equipment at home: Single point cane and does not use often    PLOF: Independent, Independent with basic ADLs, Independent with gait, and Independent with transfers  PATIENT GOALS: improve balance and strength in legs.   OBJECTIVE:  Note: Objective measures were completed at Evaluation unless otherwise noted.  DIAGNOSTIC FINDINGS:  None relevant   COGNITION: Overall cognitive status: Within functional limits for tasks assessed   SENSATION: Light touch: Impaired  and intermittent n/t in feet up to shins    COORDINATION: WFL   EDEMA:  Mild edema in bil LE   MUSCLE TONE: WFL   POSTURE: rounded shoulders and forward head  LOWER EXTREMITY ROM:     Grossly WFL  LOWER EXTREMITY MMT:    MMT Right Eval Left Eval  Hip flexion 4 4  Hip extension    Hip abduction 4+ 4+  Hip adduction 4 4  Hip internal rotation    Hip external rotation    Knee flexion 4+ 4+  Knee extension 4+ 4+  Ankle dorsiflexion 4+ 4+  Ankle plantarflexion    Ankle inversion    Ankle eversion    (Blank rows = not tested)  BED MOBILITY:  Sit to supine Complete Independence Supine to sit Complete Independence  TRANSFERS: Assistive device utilized: None  Sit to stand: Complete Independence Stand to sit: Complete Independence Chair to chair: Complete Independence Floor: unable to  complete .  Requires UE support topush from thighs to stand   RAMP:  Level of Assistance: Complete Independence Assistive device utilized:  Ramp Comments:   CURB:  Level of Assistance: Modified independence Assistive device utilized: rail Curb Comments:   STAIRS: Level  of Assistance: SBA Stair Negotiation Technique: Step to Pattern Alternating Pattern  with Single Rail on Right Number of Stairs: 4  Height of Stairs: 6  Comments: step to on descent  GAIT: Gait pattern: lateral hip instability, trunk flexed, and wide BOS Distance walked: 915 Assistive device utilized: None Level of assistance: Complete Independence Comments: increased flexed posture with increased distance  FUNCTIONAL TESTS:  5 times sit to stand: 19.11 with UE support; 21.44 with UE support on knees.  Timed up and go (TUG): 15.64, 13.74 sec  6 minute walk test: 980ft  10 meter walk test: 12.43 and 11.68 Berg Balance Scale: TBD Dynamic Gait Index: TBD  PATIENT SURVEYS:  ABC scale 60                                                                                                                              TREATMENT DATE: DATE: 09/22/23   TE Nustep BLE/BUE reciprocal movement training x 6 min level ;1x1 min, level 4 x 4 min, level 1 x64min.   PT applied gait belt and provided CGA throughout all standing interventions to reduce fall risk.  TA:  Activity Description: 4 pods in rectangle 50ft apart with half bolsters blocking home base in middle. Required to step over    Activity Setting:  home base Number of Pods:  5 Cycles/Sets:  3 Duration (Time or Hit Count):  10  Patient Stats: total time: 2:13, 1:49, 1:51 Mild LOB intermittent, requiring stepping strategy to correct LOB. Encouraged to utilized ankle strategy to correct LOB, but difficulty with use due to ankle ROM and strength deficits. Therapeutic rest breaks required following each bout  Forward/reverse gait without AD 3 x 15 ft    TE: Standing with forefoot on half bolster 2 x 30 sec UE support on rail.  Heel raise from half bolster. 2 x 10 with UE support on rail   PATIENT EDUCATION: Education details: exercise technique, Pt educated throughout session about proper posture and technique with exercises. Improved exercise technique, movement at target joints, use of target muscles after min to mod verbal, visual, tactile cues.  Person educated: Patient Education method: Archivist, Demo Education comprehension: verbalized understanding, returned demo  HOME EXERCISE PROGRAM:  Access Code: URL: [email or web address] Date: 06/23/2023 Prepared by: Carlen Chasten  Exercises - Doorway Pec Stretch at 60 Elevation  - 1 x daily - 7 x weekly - 2 sets - 1 minute hold - Standing Shoulder Row with Anchored Resistance  - 1 x daily - 7 x weekly - 3 sets - 10 reps - Low Trap Setting at Wall  - 1 x daily - 7 x weekly - 2 sets - 10 reps - Standing Tandem Balance with Counter Support  - 3 x weekly - 2 sets - 30 sec hold - Standing Near Stance in Corner with Eyes Closed  - 1 x daily - 7 x weekly - 2 sets - 30 seconds hold -  Standing Single Leg Stance with Counter Support  - 3 x weekly - 3-5 sets - as long as possible hold - Standing March with Counter Support  - 3 x weekly - 3 sets - 10 reps - Side Stepping with Counter Support  - 1 x daily - 3 x weekly - 3 sets - 3 reps - Backward Walking with Counter Support  - 1 x daily - 7 x weekly - 3 sets - 5 reps   GOALS: Goals reviewed with patient? Yes  SHORT TERM GOALS: Target date: 09/13/2023    Patient will be independent in home exercise program to improve strength/mobility for better functional independence with ADLs. Baseline: 06/07/23: HEP provided  06/16/2023: pt reports consistent compliance with HEP 06/23/2023: updated HEP provided Goal status: IN PROGRESS   LONG TERM GOALS: Target date: 10/25/2023   Patient will increase ABC score to equal to  or greater than 72  to demonstrate statistically significant improvement in mobility and quality of life.  Baseline: 60 06/16/2023: 55% 07/26/23: 70.6%  09/04/23: 73.75% Goal status: IN PROGRESS  2.  Patient (> 20 years old) will complete five times sit to stand test in < 15 seconds indicating an increased LE strength and improved balance. Baseline: 21.44 06/16/2023: 18.38 seconds no UE support 07/26/23: 15.55 seconds no UE support  09/01/23: 14.43 seconds no UE support Goal status: MET  3.  Patient will increase Berg Balance score by > 6 points to demonstrate decreased fall risk during functional activities Baseline: 1/30= 38/56 06/16/2023: 40/56 07/26/23: 42/56 09/01/23: 47/56 Goal status: MET  4.  Patient will increase 10 meter walk test to >1.70m/s as to improve gait speed for better community ambulation and to reduce fall risk. Baseline: 0.83 m/s 06/16/2023: 0.83 m/s (12 seconds) without AD 07/26/23: 0.96 m/s with no AD  09/01/23: 1.25 m/s with no AD (fast speed) Goal status: IN PROGRESS  5.  Patient will reduce timed up and go to <11 seconds to reduce fall risk and demonstrate improved transfer/gait ability. Baseline: 14 sec  06/16/2023: 12.57 seconds no AD 07/26/23: 11.88 seconds no AD  09/01/23: 10.26 seconds no AD Goal status: MET  6.  Patient will increase dynamic gait index score to >19/24 as to demonstrate reduced fall risk and improved dynamic gait balance for better safety with community/home ambulation.  Baseline: 05/12/23: 17 06/16/2023: 17/24 07/26/23: 18/24 09/01/23: 21/24 Goal status: IN PROGRESS  7.  Patient will increase FGA score by > 4 points to demonstrate decreased fall risk during functional activities. Baseline: to perform at next visit; 09/06/2023= 23/30 Goal status: NEW  ASSESSMENT:  CLINICAL IMPRESSION:     Treatment continues with balance training with dynamic gait BLE strengthening. Pt demonstrates use of stepping strategy to correct LOB while preforming large  stepping tasks over bolster. Cues for ankle strategy with difficulty due to strength and ROM deficits. Demonstrates only partial ROM for elevated heel raise. Requiring prolonged rest break to recover due to muscular fatigue.  No pain reported by pt throughout session.   Pt will continue to benefit from skilled therapy to address remaining deficits in order to improve overall QoL and return to PLOF.      OBJECTIVE IMPAIRMENTS: Abnormal gait, cardiopulmonary status limiting activity, decreased activity tolerance, decreased balance, decreased coordination, decreased endurance, decreased knowledge of use of DME, decreased mobility, difficulty walking, decreased ROM, decreased strength, hypomobility, impaired perceived functional ability, impaired flexibility, impaired sensation, improper body mechanics, postural dysfunction, and obesity.   ACTIVITY LIMITATIONS: carrying, lifting, bending,  standing, squatting, stairs, transfers, and locomotion level  PARTICIPATION LIMITATIONS: cleaning, laundry, community activity, and occupation  PERSONAL FACTORS: Age and 1-2 comorbidities: obesity CHF are also affecting patient's functional outcome.   REHAB POTENTIAL: Good  CLINICAL DECISION MAKING: Stable/uncomplicated  EVALUATION COMPLEXITY: Moderate  PLAN:  PT FREQUENCY: 1-2x/week  PT DURATION: 12 weeks  PLANNED INTERVENTIONS: 97110-Therapeutic exercises, 97530- Therapeutic activity, 97112- Neuromuscular re-education, 97535- Self Care, [id number]- Manual therapy, [phone]- Gait training, Patient/Family education, Balance training, Stair training, Taping, DME instructions, Cryotherapy, and Moist heat  PLAN FOR NEXT SESSION:  Dynamic balance interventions Fwd/back stepping over 1/2 foam roll vs hurdle with blaze pod dual-task  Dynamic gait training including: quick turning  stepping over obstacles agility ladder BLE strengthening   Aurora Lees PT, DPT  Physical Therapist - [facility]  [facility]  10:37 AM 09/22/23

## 2023-09-27 ENCOUNTER — Ambulatory Visit: Payer: PPO

## 2023-09-27 ENCOUNTER — Ambulatory Visit: Payer: PPO | Admitting: Physical Therapy

## 2023-09-27 ENCOUNTER — Other Ambulatory Visit (HOSPITAL_COMMUNITY): Payer: Self-pay

## 2023-09-27 DIAGNOSIS — R2689 Other abnormalities of gait and mobility: Secondary | ICD-10-CM

## 2023-09-27 DIAGNOSIS — R2681 Unsteadiness on feet: Secondary | ICD-10-CM

## 2023-09-27 DIAGNOSIS — R262 Difficulty in walking, not elsewhere classified: Secondary | ICD-10-CM

## 2023-09-27 DIAGNOSIS — M6281 Muscle weakness (generalized): Secondary | ICD-10-CM

## 2023-09-27 NOTE — Therapy (Signed)
 OUTPATIENT PHYSICAL THERAPY NEURO TREATMENT    Patient Name: Anthony Weber MRN: [id number] DOB:Jun 25, 1945, 78 y.o., male Today's Date: 09/28/2023   PCP: Westley Hammers, MD  REFERRING PROVIDER: Mardell Shade, MD   END OF SESSION:   PT End of Session - 09/27/23 1536     Visit Number 37    Number of Visits 46    Date for PT Re-Evaluation 10/25/23    Progress Note Due on Visit 40    PT Start Time 1533    PT Stop Time 1613    PT Time Calculation (min) 40 min    Equipment Utilized During Treatment Gait belt    Activity Tolerance Patient tolerated treatment well    Behavior During Therapy WFL for tasks assessed/performed              Past Medical History:  Diagnosis Date   Depression    Hypertension    Hypothyroidism    Past Surgical History:  Procedure Laterality Date   COLONOSCOPY WITH PROPOFOL  N/A 03/03/2015   Procedure: COLONOSCOPY WITH PROPOFOL ;  Surgeon: Cassie Click, MD;  Location: [facility] ENDOSCOPY;  Service: Endoscopy;  Laterality: N/A;   EYE SURGERY     RIGHT HEART CATH N/A 08/08/2023   Procedure: RIGHT HEART CATH;  Surgeon: Mardell Shade, MD;  Location: [facility] INVASIVE CV LAB;  Service: Cardiovascular;  Laterality: N/A;   RIGHT/LEFT HEART CATH AND CORONARY ANGIOGRAPHY N/A 01/27/2021   Procedure: RIGHT/LEFT HEART CATH AND CORONARY ANGIOGRAPHY;  Surgeon: Sammy Crisp, MD;  Location: [facility] INVASIVE CV LAB;  Service: Cardiovascular;  Laterality: N/A;   TEE WITHOUT CARDIOVERSION N/A 02/05/2021   Procedure: TRANSESOPHAGEAL ECHOCARDIOGRAM (TEE);  Surgeon: Mardell Shade, MD;  Location: [facility] ENDOSCOPY;  Service: Cardiovascular;  Laterality: N/A;   Patient Active Problem List   Diagnosis Date Noted   Hyponatremia    Acute on chronic combined systolic and diastolic congestive heart failure (HCC)    Acute on chronic respiratory failure with hypoxia and hypercapnia (HCC)    MSSA bacteremia 02/03/2021   Central line infection    Pneumonia of  both lower lobes due to methicillin susceptible Staphylococcus aureus (MSSA) (HCC)    Cardiogenic shock (HCC) 01/29/2021   Primary hypertension    Acute HFrEF (heart failure with reduced ejection fraction) (HCC)    Acute CHF (congestive heart failure) (HCC) 01/18/2021    ONSET DATE: 3 years.   REFERRING DIAG:  Diagnosis  R26.89 (ICD-10-CM) - Imbalance    THERAPY DIAG:  Unsteadiness on feet  Balance disorder  Difficulty in walking, not elsewhere classified  Imbalance  Muscle weakness (generalized)  Rationale for Evaluation and Treatment: Rehabilitation  SUBJECTIVE:  SUBJECTIVE STATEMENT:     Patient reports feels like he has less energy since starting new meds.  Patient denies any falls.     States compliant with HEP    Pt accompanied by: self  PERTINENT HISTORY:   From recent MD appointment. Admitted to [facility] 01/2021  acute HFrEF. Echo  EF 25-30% from previous EF 60-65%. R/LHC w/ no CAD, moderately elevated left heart and pulmonary artery pressures, severely elevated right heart filling pressures, severely reduced Fick cardiac output/index.  Required intropes, pressor, and lasix  drip.  Transferred to [facility] for cardiogenic/septic shock, + MSSA bacteremia. Underwent TEE showing EF 25-30%, RV moderately down, severe biatrial enlargement and moderate MR, no valvular vegetation. Required addition of mexitiline to suppress PVCs. Hospitalization c/b AKI and hypoxic/hypercarbic respiratory failure.   Discharged from [facility] 03/11/21 after he completed IV antibiotics and rehab.    At last visit midodrine  stopped and GDMT started.    Sleep study 4/23 AHI 2   Here for routine f/u. Here for f/u with his wife. Says he is doing pretty well. 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 Walking 1 mile at the  [place] almost everyday. And riding stationary bike for 30 mins every night.  PAIN:  Are you having pain? No  PRECAUTIONS: None  RED FLAGS: None   WEIGHT BEARING RESTRICTIONS: No  FALLS: Has patient fallen in last 6 months? No  LIVING ENVIRONMENT: Lives with: lives with their spouse Lives in: House/apartment Stairs: Yes: Internal: 12 steps; on right going up and on left going up and External: 1 steps; on right going up and on left going up Has following equipment at home: Single point cane and does not use often    PLOF: Independent, Independent with basic ADLs, Independent with gait, and Independent with transfers  PATIENT GOALS: improve balance and strength in legs.   OBJECTIVE:  Note: Objective measures were completed at Evaluation unless otherwise noted.  DIAGNOSTIC FINDINGS:  None relevant   COGNITION: Overall cognitive status: Within functional limits for tasks assessed   SENSATION: Light touch: Impaired  and intermittent n/t in feet up to shins    COORDINATION: WFL   EDEMA:  Mild edema in bil LE   MUSCLE TONE: WFL   POSTURE: rounded shoulders and forward head  LOWER EXTREMITY ROM:     Grossly WFL  LOWER EXTREMITY MMT:    MMT Right Eval Left Eval  Hip flexion 4 4  Hip extension    Hip abduction 4+ 4+  Hip adduction 4 4  Hip internal rotation    Hip external rotation    Knee flexion 4+ 4+  Knee extension 4+ 4+  Ankle dorsiflexion 4+ 4+  Ankle plantarflexion    Ankle inversion    Ankle eversion    (Blank rows = not tested)  BED MOBILITY:  Sit to supine Complete Independence Supine to sit Complete Independence  TRANSFERS: Assistive device utilized: None  Sit to stand: Complete Independence Stand to sit: Complete Independence Chair to chair: Complete Independence Floor: unable to complete .  Requires UE support topush from thighs to stand   RAMP:  Level of Assistance: Complete Independence Assistive device utilized:  Ramp Comments:    CURB:  Level of Assistance: Modified independence Assistive device utilized: rail Curb Comments:   STAIRS: Level of Assistance: SBA Stair Negotiation Technique: Step to Pattern Alternating Pattern  with Single Rail on Right Number of Stairs: 4  Height of Stairs: 6  Comments: step to on descent  GAIT:  Gait pattern: lateral hip instability, trunk flexed, and wide BOS Distance walked: 915 Assistive device utilized: None Level of assistance: Complete Independence Comments: increased flexed posture with increased distance  FUNCTIONAL TESTS:  5 times sit to stand: 19.11 with UE support; 21.44 with UE support on knees.  Timed up and go (TUG): 15.64, 13.74 sec  6 minute walk test: 941ft  10 meter walk test: 12.43 and 11.68 Berg Balance Scale: TBD Dynamic Gait Index: TBD  PATIENT SURVEYS:  ABC scale 60                                                                                                                              TREATMENT DATE: DATE: 09/28/23     PT applied gait belt and provided CGA throughout all standing interventions to reduce fall risk.    TA:  Sit to stand with airex pad 2 x 10 reps without UE support Standing step up onto 6 block x 20 reps alt LE  Side step up/onto 6 block then off- repeat on opp side x 10 Forward/reverse gait  3# AW without AD  4x 10  ft  (counting each Step- focusing on improving gluteal strength)   TE: Standing Hip ext alt LE x 15 reps  Standing hip - abd, ER, EXT- with cones positioned on floor at various angles- x 10 reps each postion each LE.        PATIENT EDUCATION: Education details: exercise technique, Pt educated throughout session about proper posture and technique with exercises. Improved exercise technique, movement at target joints, use of target muscles after min to mod verbal, visual, tactile cues.  Person educated: Patient Education method: Archivist, Demo Education comprehension: verbalized  understanding, returned demo  HOME EXERCISE PROGRAM:  Access Code: URL: [email or web address] Date: 06/23/2023 Prepared by: Carlen Chasten  Exercises - Doorway Pec Stretch at 60 Elevation  - 1 x daily - 7 x weekly - 2 sets - 1 minute hold - Standing Shoulder Row with Anchored Resistance  - 1 x daily - 7 x weekly - 3 sets - 10 reps - Low Trap Setting at Wall  - 1 x daily - 7 x weekly - 2 sets - 10 reps - Standing Tandem Balance with Counter Support  - 3 x weekly - 2 sets - 30 sec hold - Standing Near Stance in Corner with Eyes Closed  - 1 x daily - 7 x weekly - 2 sets - 30 seconds hold - Standing Single Leg Stance with Counter Support  - 3 x weekly - 3-5 sets - as long as possible hold - Standing March with Counter Support  - 3 x weekly - 3 sets - 10 reps - Side Stepping with Counter Support  - 1 x daily - 3 x weekly - 3 sets - 3 reps - Backward Walking with Counter Support  - 1 x daily - 7 x weekly - 3 sets - 5 reps  GOALS: Goals reviewed with patient? Yes  SHORT TERM GOALS: Target date: 09/13/2023    Patient will be independent in home exercise program to improve strength/mobility for better functional independence with ADLs. Baseline: 06/07/23: HEP provided  06/16/2023: pt reports consistent compliance with HEP 06/23/2023: updated HEP provided Goal status: IN PROGRESS   LONG TERM GOALS: Target date: 10/25/2023   Patient will increase ABC score to equal to or greater than 72  to demonstrate statistically significant improvement in mobility and quality of life.  Baseline: 60 06/16/2023: 55% 07/26/23: 70.6%  09/04/23: 73.75% Goal status: IN PROGRESS  2.  Patient (> 36 years old) will complete five times sit to stand test in < 15 seconds indicating an increased LE strength and improved balance. Baseline: 21.44 06/16/2023: 18.38 seconds no UE support 07/26/23: 15.55 seconds no UE support  09/01/23: 14.43 seconds no UE support Goal status: MET  3.  Patient will  increase Berg Balance score by > 6 points to demonstrate decreased fall risk during functional activities Baseline: 1/30= 38/56 06/16/2023: 40/56 07/26/23: 42/56 09/01/23: 47/56 Goal status: MET  4.  Patient will increase 10 meter walk test to >1.59m/s as to improve gait speed for better community ambulation and to reduce fall risk. Baseline: 0.83 m/s 06/16/2023: 0.83 m/s (12 seconds) without AD 07/26/23: 0.96 m/s with no AD  09/01/23: 1.25 m/s with no AD (fast speed) Goal status: IN PROGRESS  5.  Patient will reduce timed up and go to <11 seconds to reduce fall risk and demonstrate improved transfer/gait ability. Baseline: 14 sec  06/16/2023: 12.57 seconds no AD 07/26/23: 11.88 seconds no AD  09/01/23: 10.26 seconds no AD Goal status: MET  6.  Patient will increase dynamic gait index score to >19/24 as to demonstrate reduced fall risk and improved dynamic gait balance for better safety with community/home ambulation.  Baseline: 05/12/23: 17 06/16/2023: 17/24 07/26/23: 18/24 09/01/23: 21/24 Goal status: IN PROGRESS  7.  Patient will increase FGA score by > 4 points to demonstrate decreased fall risk during functional activities. Baseline: to perform at next visit; 09/06/2023= 23/30 Goal status: NEW  ASSESSMENT:  CLINICAL IMPRESSION:     Patient performed better with retro steps exhibiting improving dynamic balance and improving gluteal activation. He also demo improving ability to perform sit to stand with less overall verbal cues. He will benefit from further balance training to improve his functional mobility including turning and stepping over objects- Will need to continue to work on single leg balance activities as well for improved balance.   Pt will continue to benefit from skilled therapy to address remaining deficits in order to improve overall QoL and return to PLOF.      OBJECTIVE IMPAIRMENTS: Abnormal gait, cardiopulmonary status limiting activity, decreased activity tolerance,  decreased balance, decreased coordination, decreased endurance, decreased knowledge of use of DME, decreased mobility, difficulty walking, decreased ROM, decreased strength, hypomobility, impaired perceived functional ability, impaired flexibility, impaired sensation, improper body mechanics, postural dysfunction, and obesity.   ACTIVITY LIMITATIONS: carrying, lifting, bending, standing, squatting, stairs, transfers, and locomotion level  PARTICIPATION LIMITATIONS: cleaning, laundry, community activity, and occupation  PERSONAL FACTORS: Age and 1-2 comorbidities: obesity CHF are also affecting patient's functional outcome.   REHAB POTENTIAL: Good  CLINICAL DECISION MAKING: Stable/uncomplicated  EVALUATION COMPLEXITY: Moderate  PLAN:  PT FREQUENCY: 1-2x/week  PT DURATION: 12 weeks  PLANNED INTERVENTIONS: 97110-Therapeutic exercises, 97530- Therapeutic activity, [id number]- Neuromuscular re-education, 97535- Self Care, [id number]- Manual therapy, [id number]- Gait training, Patient/Family education, Balance training, Stair training, Taping,  DME instructions, Cryotherapy, and Moist heat  PLAN FOR NEXT SESSION:  Dynamic balance interventions Fwd/back stepping over 1/2 foam roll vs hurdle with blaze pod dual-task  Dynamic gait training including: quick turning  stepping over obstacles agility ladder BLE strengthening   Ossie Blend, PT Physical Therapist - [facility]  11:09 PM 09/28/23

## 2023-09-29 ENCOUNTER — Ambulatory Visit: Payer: PPO | Admitting: Physical Therapy

## 2023-09-29 DIAGNOSIS — M6281 Muscle weakness (generalized): Secondary | ICD-10-CM

## 2023-09-29 DIAGNOSIS — R2689 Other abnormalities of gait and mobility: Secondary | ICD-10-CM

## 2023-09-29 DIAGNOSIS — R2681 Unsteadiness on feet: Secondary | ICD-10-CM | POA: Diagnosis not present

## 2023-09-29 DIAGNOSIS — R262 Difficulty in walking, not elsewhere classified: Secondary | ICD-10-CM

## 2023-09-29 NOTE — Therapy (Signed)
 OUTPATIENT PHYSICAL THERAPY NEURO TREATMENT    Patient Name: [facility] MRN: [id number] DOB:[birth year], 78 y.o., male Today's Date: 09/29/2023   PCP: Anthony Honor BROCKS, MD  REFERRING PROVIDER: Cherrie Toribio SAUNDERS, MD   END OF SESSION:   PT End of Session - 09/29/23 1032     Visit Number 38    Number of Visits 46    Date for PT Re-Evaluation 10/25/23    Progress Note Due on Visit 40    PT Start Time 1020    PT Stop Time 1100    PT Time Calculation (min) 40 min    Equipment Utilized During Treatment Gait belt    Activity Tolerance Patient tolerated treatment well    Behavior During Therapy WFL for tasks assessed/performed              Past Medical History:  Diagnosis Date   Depression    Hypertension    Hypothyroidism    Past Anthony History:  Procedure Laterality Date   COLONOSCOPY WITH PROPOFOL  N/A 03/03/2015   Procedure: COLONOSCOPY WITH PROPOFOL ;  Surgeon: Anthony ONEIDA Holmes, MD;  Location: [facility];  Service: Endoscopy;  Laterality: N/A;   EYE SURGERY     RIGHT HEART CATH N/A 08/08/2023   Procedure: RIGHT HEART CATH;  Surgeon: Anthony Toribio SAUNDERS, MD;  Location: [facility];  Service: Cardiovascular;  Laterality: N/A;   RIGHT/LEFT HEART CATH AND CORONARY ANGIOGRAPHY N/A 01/27/2021   Procedure: RIGHT/LEFT HEART CATH AND CORONARY ANGIOGRAPHY;  Surgeon: Anthony Bruckner, MD;  Location: [facility];  Service: Cardiovascular;  Laterality: N/A;   TEE WITHOUT CARDIOVERSION N/A 02/05/2021   Procedure: TRANSESOPHAGEAL ECHOCARDIOGRAM (TEE);  Surgeon: Anthony Toribio SAUNDERS, MD;  Location: [facility];  Service: Cardiovascular;  Laterality: N/A;   Patient Active Problem List   Diagnosis Date Noted   Hyponatremia    Acute on chronic combined systolic and diastolic congestive heart failure (HCC)    Acute on chronic respiratory failure with hypoxia and hypercapnia (HCC)    MSSA bacteremia 02/03/2021   Central line infection    Pneumonia of  both lower lobes due to methicillin susceptible Staphylococcus aureus (MSSA) (HCC)    Cardiogenic shock (HCC) 01/29/2021   Primary hypertension    Acute HFrEF (heart failure with reduced ejection fraction) (HCC)    Acute CHF (congestive heart failure) (HCC) 01/18/2021    ONSET DATE: 3 years.   REFERRING DIAG:  Diagnosis  R26.89 (ICD-10-CM) - Imbalance    THERAPY DIAG:  Imbalance  Unsteadiness on feet  Balance disorder  Difficulty in walking, not elsewhere classified  Muscle weakness (generalized)  Rationale for Evaluation and Treatment: Rehabilitation  SUBJECTIVE:  SUBJECTIVE STATEMENT:     Patient reports feels like he has less energy since starting new meds.  Patient denies any falls.     States compliant with HEP    Pt accompanied by: self  PERTINENT HISTORY:   From recent MD appointment. Admitted to [facility] 01/2021  acute HFrEF. Echo  EF 25-30% from previous EF 60-65%. R/LHC w/ no CAD, moderately elevated left heart and pulmonary artery pressures, severely elevated right heart filling pressures, severely reduced Fick cardiac output/index.  Required intropes, pressor, and lasix  drip.  Transferred to [facility] for cardiogenic/septic shock, + MSSA bacteremia. Underwent TEE showing EF 25-30%, RV moderately down, severe biatrial enlargement and moderate MR, no valvular vegetation. Required addition of mexitiline to suppress PVCs. Hospitalization c/b AKI and hypoxic/hypercarbic respiratory failure.   Discharged from [facility] 03/11/21 after he completed IV antibiotics and rehab.    At last visit midodrine  stopped and GDMT started.    Sleep study 4/23 AHI 2   Here for routine f/u. Here for f/u with his wife. Says he is doing pretty well. 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 Walking 1 mile at the  [place] almost everyday. And riding stationary bike for 30 mins every night.  PAIN:  Are you having pain? No  PRECAUTIONS: None  RED FLAGS: None   WEIGHT BEARING RESTRICTIONS: No  FALLS: Has patient fallen in last 6 months? No  LIVING ENVIRONMENT: Lives with: lives with their spouse Lives in: House/apartment Stairs: Yes: Internal: 12 steps; on right going up and on left going up and External: 1 steps; on right going up and on left going up Has following equipment at home: Single point cane and does not use often    PLOF: Independent, Independent with basic ADLs, Independent with gait, and Independent with transfers  PATIENT GOALS: improve balance and strength in legs.   OBJECTIVE:  Note: Objective measures were completed at Evaluation unless otherwise noted.  DIAGNOSTIC FINDINGS:  None relevant   COGNITION: Overall cognitive status: Within functional limits for tasks assessed   SENSATION: Light touch: Impaired  and intermittent n/t in feet up to shins    COORDINATION: WFL   EDEMA:  Mild edema in bil LE   MUSCLE TONE: WFL   POSTURE: rounded shoulders and forward head  LOWER EXTREMITY ROM:     Grossly WFL  LOWER EXTREMITY MMT:    MMT Right Eval Left Eval  Hip flexion 4 4  Hip extension    Hip abduction 4+ 4+  Hip adduction 4 4  Hip internal rotation    Hip external rotation    Knee flexion 4+ 4+  Knee extension 4+ 4+  Ankle dorsiflexion 4+ 4+  Ankle plantarflexion    Ankle inversion    Ankle eversion    (Blank rows = not tested)  BED MOBILITY:  Sit to supine Complete Independence Supine to sit Complete Independence  TRANSFERS: Assistive device utilized: None  Sit to stand: Complete Independence Stand to sit: Complete Independence Chair to chair: Complete Independence Floor: unable to complete .  Requires UE support topush from thighs to stand   RAMP:  Level of Assistance: Complete Independence Assistive device utilized:  Ramp Comments:    CURB:  Level of Assistance: Modified independence Assistive device utilized: rail Curb Comments:   STAIRS: Level of Assistance: SBA Stair Negotiation Technique: Step to Pattern Alternating Pattern  with Single Rail on Right Number of Stairs: 4  Height of Stairs: 6  Comments: step to on descent  GAIT:  Gait pattern: lateral hip instability, trunk flexed, and wide BOS Distance walked: 915 Assistive device utilized: None Level of assistance: Complete Independence Comments: increased flexed posture with increased distance  FUNCTIONAL TESTS:  5 times sit to stand: 19.11 with UE support; 21.44 with UE support on knees.  Timed up and go (TUG): 15.64, 13.74 sec  6 minute walk test: 933ft  10 meter walk test: 12.43 and 11.68 Berg Balance Scale: TBD Dynamic Gait Index: TBD  PATIENT SURVEYS:  ABC scale 60                                                                                                                              TREATMENT DATE: DATE: 09/29/23   VS  115/65 HR 65 119/60 HR 69  PT applied gait belt and provided CGA throughout all standing interventions to reduce fall risk.   TA:  Sit to stand without UE support  pad 3 x 5 reps Standing step up onto 4 block x 10 reps Bil Forward/reverse gait  2.5# AW without AD   66ft x 5  Side stepping 2.5 # AW 12ft x 5 bil  HEP review:  standing Tandem Balance with Counter Support  - 4 sets - 30 sec hold bil  - Standing Near Stance in Corner with Eyes Closed  - 1 x daily - 7 x weekly - 2 sets - 30 seconds hold - Standing Single Leg Stance with Counter Support  - 3 x weekly - 3-5 sets - as long as possible hold - Standing March with Counter Support  - 3 x weekly - 3 sets - 10 reps   Throughout session, CGA provided for safety with gait belt in place.   PATIENT EDUCATION: Education details: exercise technique, Pt educated throughout session about proper posture and technique with exercises. Improved exercise technique,  movement at target joints, use of target muscles after min to mod verbal, visual, tactile cues.  Person educated: Patient Education method: Archivist, Demo Education comprehension: verbalized understanding, returned demo  HOME EXERCISE PROGRAM:  Access Code: URL: [email or web address] Date: 06/23/2023 Prepared by: Connell Kiss  Exercises - Doorway Pec Stretch at 60 Elevation  - 1 x daily - 7 x weekly - 2 sets - 1 minute hold - Standing Shoulder Row with Anchored Resistance  - 1 x daily - 7 x weekly - 3 sets - 10 reps - Low Trap Setting at Wall  - 1 x daily - 7 x weekly - 2 sets - 10 reps - Standing Tandem Balance with Counter Support  - 3 x weekly - 2 sets - 30 sec hold - Standing Near Stance in Corner with Eyes Closed  - 1 x daily - 7 x weekly - 2 sets - 30 seconds hold - Standing Single Leg Stance with Counter Support  - 3 x weekly - 3-5 sets - as long as possible hold - Standing March with Counter Support  - 3 x  weekly - 3 sets - 10 reps - Side Stepping with Counter Support  - 1 x daily - 3 x weekly - 3 sets - 3 reps - Backward Walking with Counter Support  - 1 x daily - 7 x weekly - 3 sets - 5 reps   GOALS: Goals reviewed with patient? Yes  SHORT TERM GOALS: Target date: 09/13/2023    Patient will be independent in home exercise program to improve strength/mobility for better functional independence with ADLs. Baseline: 06/07/23: HEP provided  06/16/2023: pt reports consistent compliance with HEP 06/23/2023: updated HEP provided Goal status: IN PROGRESS   LONG TERM GOALS: Target date: 10/25/2023   Patient will increase ABC score to equal to or greater than 72  to demonstrate statistically significant improvement in mobility and quality of life.  Baseline: 60 06/16/2023: 55% 07/26/23: 70.6%  09/04/23: 73.75% Goal status: IN PROGRESS  2.  Patient (> 23 years old) will complete five times sit to stand test in < 15 seconds indicating an increased LE  strength and improved balance. Baseline: 21.44 06/16/2023: 18.38 seconds no UE support 07/26/23: 15.55 seconds no UE support  09/01/23: 14.43 seconds no UE support Goal status: MET  3.  Patient will increase Berg Balance score by > 6 points to demonstrate decreased fall risk during functional activities Baseline: 1/30= 38/56 06/16/2023: 40/56 07/26/23: 42/56 09/01/23: 47/56 Goal status: MET  4.  Patient will increase 10 meter walk test to >1.67m/s as to improve gait speed for better community ambulation and to reduce fall risk. Baseline: 0.83 m/s 06/16/2023: 0.83 m/s (12 seconds) without AD 07/26/23: 0.96 m/s with no AD  09/01/23: 1.25 m/s with no AD (fast speed) Goal status: IN PROGRESS  5.  Patient will reduce timed up and go to <11 seconds to reduce fall risk and demonstrate improved transfer/gait ability. Baseline: 14 sec  06/16/2023: 12.57 seconds no AD 07/26/23: 11.88 seconds no AD  09/01/23: 10.26 seconds no AD Goal status: MET  6.  Patient will increase dynamic gait index score to >19/24 as to demonstrate reduced fall risk and improved dynamic gait balance for better safety with community/home ambulation.  Baseline: 05/12/23: 17 06/16/2023: 17/24 07/26/23: 18/24 09/01/23: 21/24 Goal status: IN PROGRESS  7.  Patient will increase FGA score by > 4 points to demonstrate decreased fall risk during functional activities. Baseline: to perform at next visit; 09/06/2023= 23/30 Goal status: NEW  ASSESSMENT:  CLINICAL IMPRESSION:     Patient performed better with retro steps exhibiting improving dynamic balance and improving gluteal activation. He also demo improving ability to perform sit to stand with less overall verbal cues from standard seat height without UE support. PT reports inconsistency with HEP compliance, so HEP reviewed and new hand out provided, He will benefit from further balance training to improve his functional mobility including turning and stepping over objects- Will need to  continue to work on single leg balance activities as well improved gluteal activation.   Pt will continue to benefit from skilled therapy to address remaining deficits in order to improve overall QoL and return to PLOF.      OBJECTIVE IMPAIRMENTS: Abnormal gait, cardiopulmonary status limiting activity, decreased activity tolerance, decreased balance, decreased coordination, decreased endurance, decreased knowledge of use of DME, decreased mobility, difficulty walking, decreased ROM, decreased strength, hypomobility, impaired perceived functional ability, impaired flexibility, impaired sensation, improper body mechanics, postural dysfunction, and obesity.   ACTIVITY LIMITATIONS: carrying, lifting, bending, standing, squatting, stairs, transfers, and locomotion level  PARTICIPATION LIMITATIONS: cleaning,  laundry, community activity, and occupation  PERSONAL FACTORS: Age and 1-2 comorbidities: obesity CHF are also affecting patient's functional outcome.   REHAB POTENTIAL: Good  CLINICAL DECISION MAKING: Stable/uncomplicated  EVALUATION COMPLEXITY: Moderate  PLAN:  PT FREQUENCY: 1-2x/week  PT DURATION: 12 weeks  PLANNED INTERVENTIONS: 97110-Therapeutic exercises, 97530- Therapeutic activity, 97112- Neuromuscular re-education, 97535- Self Care, [id number]- Manual therapy, [phone]- Gait training, Patient/Family education, Balance training, Stair training, Taping, DME instructions, Cryotherapy, and Moist heat  PLAN FOR NEXT SESSION:  Dynamic balance interventions Fwd/back stepping over 1/2 foam roll vs hurdle with blaze pod dual-task  Dynamic gait training including: quick turning  stepping over obstacles agility ladder BLE strengthening  Massie Dollar PT, DPT  Physical Therapist - [facility]  [facility]  7:58 AM 09/30/23

## 2023-09-30 ENCOUNTER — Other Ambulatory Visit (HOSPITAL_COMMUNITY): Payer: Self-pay

## 2023-09-30 ENCOUNTER — Other Ambulatory Visit: Payer: Self-pay

## 2023-09-30 MED ORDER — LEVOTHYROXINE SODIUM 75 MCG PO TABS
75.0000 ug | ORAL_TABLET | Freq: Every morning | ORAL | 3 refills | Status: DC
  Filled 2023-09-30 (×3): qty 90, 90d supply, fill #0

## 2023-10-03 ENCOUNTER — Other Ambulatory Visit: Payer: Self-pay

## 2023-10-03 ENCOUNTER — Other Ambulatory Visit (HOSPITAL_COMMUNITY): Payer: Self-pay

## 2023-10-03 MED ORDER — MEXILETINE HCL 150 MG PO CAPS
150.0000 mg | ORAL_CAPSULE | Freq: Two times a day (BID) | ORAL | 0 refills | Status: DC
  Filled 2023-10-03: qty 180, 90d supply, fill #0

## 2023-10-04 ENCOUNTER — Ambulatory Visit: Payer: PPO | Admitting: Physical Therapy

## 2023-10-04 ENCOUNTER — Ambulatory Visit: Payer: PPO

## 2023-10-04 DIAGNOSIS — R262 Difficulty in walking, not elsewhere classified: Secondary | ICD-10-CM

## 2023-10-04 DIAGNOSIS — R2681 Unsteadiness on feet: Secondary | ICD-10-CM

## 2023-10-04 DIAGNOSIS — R2689 Other abnormalities of gait and mobility: Secondary | ICD-10-CM

## 2023-10-04 DIAGNOSIS — M6281 Muscle weakness (generalized): Secondary | ICD-10-CM

## 2023-10-04 NOTE — Therapy (Signed)
 OUTPATIENT PHYSICAL THERAPY NEURO TREATMENT    Patient Name: Anthony Weber MRN: [id number] DOB:[birth year], 78 y.o., male Today's Date: 10/04/2023   PCP: Corlis Honor BROCKS, MD  REFERRING PROVIDER: Cherrie Toribio SAUNDERS, MD   END OF SESSION:   PT End of Session - 10/04/23 1524     Visit Number 39    Number of Visits 46    Date for PT Re-Evaluation 10/25/23    Progress Note Due on Visit 40    PT Start Time 1520    PT Stop Time 1601    PT Time Calculation (min) 41 min    Equipment Utilized During Treatment Gait belt    Activity Tolerance Patient tolerated treatment well    Behavior During Therapy WFL for tasks assessed/performed              Past Medical History:  Diagnosis Date   Depression    Hypertension    Hypothyroidism    Past Surgical History:  Procedure Laterality Date   COLONOSCOPY WITH PROPOFOL  N/A 03/03/2015   Procedure: COLONOSCOPY WITH PROPOFOL ;  Surgeon: Lamar ONEIDA Holmes, MD;  Location: [facility] ENDOSCOPY;  Service: Endoscopy;  Laterality: N/A;   EYE SURGERY     RIGHT HEART CATH N/A 08/08/2023   Procedure: RIGHT HEART CATH;  Surgeon: Cherrie Toribio SAUNDERS, MD;  Location: [facility] INVASIVE CV LAB;  Service: Cardiovascular;  Laterality: N/A;   RIGHT/LEFT HEART CATH AND CORONARY ANGIOGRAPHY N/A 01/27/2021   Procedure: RIGHT/LEFT HEART CATH AND CORONARY ANGIOGRAPHY;  Surgeon: Mady Bruckner, MD;  Location: [facility] INVASIVE CV LAB;  Service: Cardiovascular;  Laterality: N/A;   TEE WITHOUT CARDIOVERSION N/A 02/05/2021   Procedure: TRANSESOPHAGEAL ECHOCARDIOGRAM (TEE);  Surgeon: Cherrie Toribio SAUNDERS, MD;  Location: [facility] ENDOSCOPY;  Service: Cardiovascular;  Laterality: N/A;   Patient Active Problem List   Diagnosis Date Noted   Hyponatremia    Acute on chronic combined systolic and diastolic congestive heart failure (HCC)    Acute on chronic respiratory failure with hypoxia and hypercapnia (HCC)    MSSA bacteremia 02/03/2021   Central line infection    Pneumonia of  both lower lobes due to methicillin susceptible Staphylococcus aureus (MSSA) (HCC)    Cardiogenic shock (HCC) 01/29/2021   Primary hypertension    Acute HFrEF (heart failure with reduced ejection fraction) (HCC)    Acute CHF (congestive heart failure) (HCC) 01/18/2021    ONSET DATE: 3 years.   REFERRING DIAG:  Diagnosis  R26.89 (ICD-10-CM) - Imbalance    THERAPY DIAG:  Imbalance  Unsteadiness on feet  Balance disorder  Difficulty in walking, not elsewhere classified  Muscle weakness (generalized)  Rationale for Evaluation and Treatment: Rehabilitation  SUBJECTIVE:  SUBJECTIVE STATEMENT:     Patient reports feels like he has less energy since starting new meds.  Patient denies any falls.     States compliant with HEP    Pt accompanied by: self  PERTINENT HISTORY:   From recent MD appointment. Admitted to [facility] 01/2021  acute HFrEF. Echo  EF 25-30% from previous EF 60-65%. R/LHC w/ no CAD, moderately elevated left heart and pulmonary artery pressures, severely elevated right heart filling pressures, severely reduced Fick cardiac output/index.  Required intropes, pressor, and lasix  drip.  Transferred to [facility] for cardiogenic/septic shock, + MSSA bacteremia. Underwent TEE showing EF 25-30%, RV moderately down, severe biatrial enlargement and moderate MR, no valvular vegetation. Required addition of mexitiline to suppress PVCs. Hospitalization c/b AKI and hypoxic/hypercarbic respiratory failure.   Discharged from [facility] 03/11/21 after he completed IV antibiotics and rehab.    At last visit midodrine  stopped and GDMT started.    Sleep study 4/23 AHI 2   Here for routine f/u. Here for f/u with his wife. Says he is doing pretty well. 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 Walking 1 mile at the  [place] almost everyday. And riding stationary bike for 30 mins every night.  PAIN:  Are you having pain? No  PRECAUTIONS: None  RED FLAGS: None   WEIGHT BEARING RESTRICTIONS: No  FALLS: Has patient fallen in last 6 months? No  LIVING ENVIRONMENT: Lives with: lives with their spouse Lives in: House/apartment Stairs: Yes: Internal: 12 steps; on right going up and on left going up and External: 1 steps; on right going up and on left going up Has following equipment at home: Single point cane and does not use often    PLOF: Independent, Independent with basic ADLs, Independent with gait, and Independent with transfers  PATIENT GOALS: improve balance and strength in legs.   OBJECTIVE:  Note: Objective measures were completed at Evaluation unless otherwise noted.  DIAGNOSTIC FINDINGS:  None relevant   COGNITION: Overall cognitive status: Within functional limits for tasks assessed   SENSATION: Light touch: Impaired  and intermittent n/t in feet up to shins    COORDINATION: WFL   EDEMA:  Mild edema in bil LE   MUSCLE TONE: WFL   POSTURE: rounded shoulders and forward head  LOWER EXTREMITY ROM:     Grossly WFL  LOWER EXTREMITY MMT:    MMT Right Eval Left Eval  Hip flexion 4 4  Hip extension    Hip abduction 4+ 4+  Hip adduction 4 4  Hip internal rotation    Hip external rotation    Knee flexion 4+ 4+  Knee extension 4+ 4+  Ankle dorsiflexion 4+ 4+  Ankle plantarflexion    Ankle inversion    Ankle eversion    (Blank rows = not tested)  BED MOBILITY:  Sit to supine Complete Independence Supine to sit Complete Independence  TRANSFERS: Assistive device utilized: None  Sit to stand: Complete Independence Stand to sit: Complete Independence Chair to chair: Complete Independence Floor: unable to complete .  Requires UE support topush from thighs to stand   RAMP:  Level of Assistance: Complete Independence Assistive device utilized:  Ramp Comments:    CURB:  Level of Assistance: Modified independence Assistive device utilized: rail Curb Comments:   STAIRS: Level of Assistance: SBA Stair Negotiation Technique: Step to Pattern Alternating Pattern  with Single Rail on Right Number of Stairs: 4  Height of Stairs: 6  Comments: step to on descent  GAIT:  Gait pattern: lateral hip instability, trunk flexed, and wide BOS Distance walked: 915 Assistive device utilized: None Level of assistance: Complete Independence Comments: increased flexed posture with increased distance  FUNCTIONAL TESTS:  5 times sit to stand: 19.11 with UE support; 21.44 with UE support on knees.  Timed up and go (TUG): 15.64, 13.74 sec  6 minute walk test: 91ft  10 meter walk test: 12.43 and 11.68 Berg Balance Scale: TBD Dynamic Gait Index: TBD  PATIENT SURVEYS:  ABC scale 60                                                                                                                              TREATMENT DATE: DATE: 10/04/23    PT applied gait belt and provided CGA throughout all standing interventions to reduce fall risk.   TA:  Sit to stand without UE support x 12 reps ( no hesitancy or difficulty)  Standing step up onto 6  block x 10 reps Bil- No UE Support  Standing hip ext with RTB 2 x 10 reps each LE Forward/reverse gait  2.5# AW without AD   30ft x 5  Resistive Side stepping GTB -41ft x 5 down and back- Patient reports as medium- VC for wide steps - all without UE Support.   NMR:  Step tap onto 6 step without UE support x 20 reps each  Step up onto 6 step with 1 LE then swing opp LE up/over landing over step block onto floor then step up and over 1/2 foam roll - turn 180 deg then repeat x 8- down and back. Tandem walking in // bars down and back 5 (intermittent UE reaching with some unsteadiness- improved on last 2 rounds). Retro walking  (counting steps) in // bars - down and back x 4    Throughout session, CGA provided for  safety with gait belt in place.   PATIENT EDUCATION: Education details: exercise technique, Pt educated throughout session about proper posture and technique with exercises. Improved exercise technique, movement at target joints, use of target muscles after min to mod verbal, visual, tactile cues.  Person educated: Patient Education method: Archivist, Demo Education comprehension: verbalized understanding, returned demo  HOME EXERCISE PROGRAM:  Access Code: URL: [email or web address] Date: 06/23/2023 Prepared by: Connell Kiss  Exercises - Doorway Pec Stretch at 60 Elevation  - 1 x daily - 7 x weekly - 2 sets - 1 minute hold - Standing Shoulder Row with Anchored Resistance  - 1 x daily - 7 x weekly - 3 sets - 10 reps - Low Trap Setting at Wall  - 1 x daily - 7 x weekly - 2 sets - 10 reps - Standing Tandem Balance with Counter Support  - 3 x weekly - 2 sets - 30 sec hold - Standing Near Stance in Corner with Eyes Closed  - 1 x daily - 7 x weekly - 2 sets - 30 seconds hold -  Standing Single Leg Stance with Counter Support  - 3 x weekly - 3-5 sets - as long as possible hold - Standing March with Counter Support  - 3 x weekly - 3 sets - 10 reps - Side Stepping with Counter Support  - 1 x daily - 3 x weekly - 3 sets - 3 reps - Backward Walking with Counter Support  - 1 x daily - 7 x weekly - 3 sets - 5 reps   GOALS: Goals reviewed with patient? Yes  SHORT TERM GOALS: Target date: 09/13/2023    Patient will be independent in home exercise program to improve strength/mobility for better functional independence with ADLs. Baseline: 06/07/23: HEP provided  06/16/2023: pt reports consistent compliance with HEP 06/23/2023: updated HEP provided Goal status: IN PROGRESS   LONG TERM GOALS: Target date: 10/25/2023   Patient will increase ABC score to equal to or greater than 72  to demonstrate statistically significant improvement in mobility and quality of life.   Baseline: 60 06/16/2023: 55% 07/26/23: 70.6%  09/04/23: 73.75% Goal status: IN PROGRESS  2.  Patient (> 85 years old) will complete five times sit to stand test in < 15 seconds indicating an increased LE strength and improved balance. Baseline: 21.44 06/16/2023: 18.38 seconds no UE support 07/26/23: 15.55 seconds no UE support  09/01/23: 14.43 seconds no UE support Goal status: MET  3.  Patient will increase Berg Balance score by > 6 points to demonstrate decreased fall risk during functional activities Baseline: 1/30= 38/56 06/16/2023: 40/56 07/26/23: 42/56 09/01/23: 47/56 Goal status: MET  4.  Patient will increase 10 meter walk test to >1.68m/s as to improve gait speed for better community ambulation and to reduce fall risk. Baseline: 0.83 m/s 06/16/2023: 0.83 m/s (12 seconds) without AD 07/26/23: 0.96 m/s with no AD  09/01/23: 1.25 m/s with no AD (fast speed) Goal status: IN PROGRESS  5.  Patient will reduce timed up and go to <11 seconds to reduce fall risk and demonstrate improved transfer/gait ability. Baseline: 14 sec  06/16/2023: 12.57 seconds no AD 07/26/23: 11.88 seconds no AD  09/01/23: 10.26 seconds no AD Goal status: MET  6.  Patient will increase dynamic gait index score to >19/24 as to demonstrate reduced fall risk and improved dynamic gait balance for better safety with community/home ambulation.  Baseline: 05/12/23: 17 06/16/2023: 17/24 07/26/23: 18/24 09/01/23: 21/24 Goal status: IN PROGRESS  7.  Patient will increase FGA score by > 4 points to demonstrate decreased fall risk during functional activities. Baseline: to perform at next visit; 09/06/2023= 23/30 Goal status: NEW  ASSESSMENT:  CLINICAL IMPRESSION:     Patient continues to exhibit steady progress- able to stand today without UE support and no hesitation today. He continues to perform better with retro steps exhibiting improving dynamic balance and improving gluteal activation with increased step length.  Pt will  continue to benefit from skilled therapy to address remaining deficits in order to improve overall QoL and return to PLOF.      OBJECTIVE IMPAIRMENTS: Abnormal gait, cardiopulmonary status limiting activity, decreased activity tolerance, decreased balance, decreased coordination, decreased endurance, decreased knowledge of use of DME, decreased mobility, difficulty walking, decreased ROM, decreased strength, hypomobility, impaired perceived functional ability, impaired flexibility, impaired sensation, improper body mechanics, postural dysfunction, and obesity.   ACTIVITY LIMITATIONS: carrying, lifting, bending, standing, squatting, stairs, transfers, and locomotion level  PARTICIPATION LIMITATIONS: cleaning, laundry, community activity, and occupation  PERSONAL FACTORS: Age and 1-2 comorbidities: obesity CHF are also affecting  patient's functional outcome.   REHAB POTENTIAL: Good  CLINICAL DECISION MAKING: Stable/uncomplicated  EVALUATION COMPLEXITY: Moderate  PLAN:  PT FREQUENCY: 1-2x/week  PT DURATION: 12 weeks  PLANNED INTERVENTIONS: 97110-Therapeutic exercises, 97530- Therapeutic activity, 97112- Neuromuscular re-education, 97535- Self Care, [id number]- Manual therapy, [phone]- Gait training, Patient/Family education, Balance training, Stair training, Taping, DME instructions, Cryotherapy, and Moist heat  PLAN FOR NEXT SESSION:  Dynamic balance interventions Fwd/back stepping over 1/2 foam roll vs hurdle with blaze pod dual-task  Dynamic gait training including: quick turning  stepping over obstacles agility ladder BLE strengthening  Chyrl London, PT  Physical Therapist - [facility]  [facility]  5:25 PM 10/04/23

## 2023-10-05 ENCOUNTER — Other Ambulatory Visit: Payer: Self-pay

## 2023-10-05 ENCOUNTER — Other Ambulatory Visit: Payer: Self-pay | Admitting: Internal Medicine

## 2023-10-05 ENCOUNTER — Other Ambulatory Visit (HOSPITAL_COMMUNITY): Payer: Self-pay | Admitting: Internal Medicine

## 2023-10-05 ENCOUNTER — Other Ambulatory Visit (HOSPITAL_COMMUNITY): Payer: Self-pay

## 2023-10-05 DIAGNOSIS — I5042 Chronic combined systolic (congestive) and diastolic (congestive) heart failure: Secondary | ICD-10-CM

## 2023-10-06 ENCOUNTER — Other Ambulatory Visit (HOSPITAL_COMMUNITY): Payer: Self-pay | Admitting: Internal Medicine

## 2023-10-06 ENCOUNTER — Other Ambulatory Visit (HOSPITAL_COMMUNITY): Payer: Self-pay

## 2023-10-06 ENCOUNTER — Ambulatory Visit

## 2023-10-06 ENCOUNTER — Other Ambulatory Visit: Payer: Self-pay

## 2023-10-06 DIAGNOSIS — R2681 Unsteadiness on feet: Secondary | ICD-10-CM

## 2023-10-06 DIAGNOSIS — R262 Difficulty in walking, not elsewhere classified: Secondary | ICD-10-CM

## 2023-10-06 DIAGNOSIS — M6281 Muscle weakness (generalized): Secondary | ICD-10-CM

## 2023-10-06 DIAGNOSIS — I5042 Chronic combined systolic (congestive) and diastolic (congestive) heart failure: Secondary | ICD-10-CM

## 2023-10-06 MED ORDER — EMPAGLIFLOZIN 10 MG PO TABS
10.0000 mg | ORAL_TABLET | Freq: Every day | ORAL | 11 refills | Status: DC
Start: 2023-10-06 — End: 2023-11-10
  Filled 2023-10-06: qty 60, 60d supply, fill #0

## 2023-10-06 MED FILL — Tirzepatide Soln Auto-injector 5 MG/0.5ML: SUBCUTANEOUS | 28 days supply | Qty: 2 | Fill #0 | Status: AC

## 2023-10-06 NOTE — Therapy (Signed)
 OUTPATIENT PHYSICAL THERAPY NEURO TREATMENT/Physical Therapy Progress Note   Dates of reporting period  09/01/2023   to   10/06/2023     Patient Name: Anthony Weber MRN: [id number] DOB:Sep 02, 1945, 78 y.o., male Today's Date: 10/06/2023   PCP: Corlis Honor BROCKS, MD  REFERRING PROVIDER: Cherrie Toribio SAUNDERS, MD   END OF SESSION:   PT End of Session - 10/06/23 1102     Visit Number 40    Number of Visits 46    Date for PT Re-Evaluation 10/25/23    Progress Note Due on Visit 40    Equipment Utilized During Treatment Gait belt    Activity Tolerance Patient tolerated treatment well    Behavior During Therapy [place] for tasks assessed/performed              Past Medical History:  Diagnosis Date   Depression    Hypertension    Hypothyroidism    Past Surgical History:  Procedure Laterality Date   COLONOSCOPY WITH PROPOFOL  N/A 03/03/2015   Procedure: COLONOSCOPY WITH PROPOFOL ;  Surgeon: Lamar ONEIDA Holmes, MD;  Location: [facility] ENDOSCOPY;  Service: Endoscopy;  Laterality: N/A;   EYE SURGERY     RIGHT HEART CATH N/A 08/08/2023   Procedure: RIGHT HEART CATH;  Surgeon: Cherrie Toribio SAUNDERS, MD;  Location: [facility] INVASIVE CV LAB;  Service: Cardiovascular;  Laterality: N/A;   RIGHT/LEFT HEART CATH AND CORONARY ANGIOGRAPHY N/A 01/27/2021   Procedure: RIGHT/LEFT HEART CATH AND CORONARY ANGIOGRAPHY;  Surgeon: Mady Bruckner, MD;  Location: [facility] INVASIVE CV LAB;  Service: Cardiovascular;  Laterality: N/A;   TEE WITHOUT CARDIOVERSION N/A 02/05/2021   Procedure: TRANSESOPHAGEAL ECHOCARDIOGRAM (TEE);  Surgeon: Cherrie Toribio SAUNDERS, MD;  Location: [facility] ENDOSCOPY;  Service: Cardiovascular;  Laterality: N/A;   Patient Active Problem List   Diagnosis Date Noted   Hyponatremia    Acute on chronic combined systolic and diastolic congestive heart failure (HCC)    Acute on chronic respiratory failure with hypoxia and hypercapnia (HCC)    MSSA bacteremia 02/03/2021   Central line infection     Pneumonia of both lower lobes due to methicillin susceptible Staphylococcus aureus (MSSA) (HCC)    Cardiogenic shock (HCC) 01/29/2021   Primary hypertension    Acute HFrEF (heart failure with reduced ejection fraction) (HCC)    Acute CHF (congestive heart failure) (HCC) 01/18/2021    ONSET DATE: 3 years.   REFERRING DIAG:  Diagnosis  R26.89 (ICD-10-CM) - Imbalance    THERAPY DIAG:  No diagnosis found.  Rationale for Evaluation and Treatment: Rehabilitation  SUBJECTIVE:  SUBJECTIVE STATEMENT:    Pt reports no changes, except does feel things have improved a little bit since starting new med. He reports no stumbles/falls. HEP is going well.     States compliant with HEP    Pt accompanied by: self  PERTINENT HISTORY:   From recent MD appointment. Admitted to [facility] 01/2021  acute HFrEF. Echo  EF 25-30% from previous EF 60-65%. R/LHC w/ no CAD, moderately elevated left heart and pulmonary artery pressures, severely elevated right heart filling pressures, severely reduced Fick cardiac output/index.  Required intropes, pressor, and lasix  drip.  Transferred to [facility] for cardiogenic/septic shock, + MSSA bacteremia. Underwent TEE showing EF 25-30%, RV moderately down, severe biatrial enlargement and moderate MR, no valvular vegetation. Required addition of mexitiline to suppress PVCs. Hospitalization c/b AKI and hypoxic/hypercarbic respiratory failure.   Discharged from [facility] 03/11/21 after he completed IV antibiotics and rehab.    At last visit midodrine  stopped and GDMT started.    Sleep study 4/23 AHI 2   Here for routine f/u. Here for f/u with his wife. Says he is doing pretty well. 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 Walking 1 mile at the [place] almost everyday. And riding stationary bike for 30 mins  every night.  PAIN:  Are you having pain? No  PRECAUTIONS: None  RED FLAGS: None   WEIGHT BEARING RESTRICTIONS: No  FALLS: Has patient fallen in last 6 months? No  LIVING ENVIRONMENT: Lives with: lives with their spouse Lives in: House/apartment Stairs: Yes: Internal: 12 steps; on right going up and on left going up and External: 1 steps; on right going up and on left going up Has following equipment at home: Single point cane and does not use often    PLOF: Independent, Independent with basic ADLs, Independent with gait, and Independent with transfers  PATIENT GOALS: improve balance and strength in legs.   OBJECTIVE:  Note: Objective measures were completed at Evaluation unless otherwise noted.  DIAGNOSTIC FINDINGS:  None relevant   COGNITION: Overall cognitive status: Within functional limits for tasks assessed   SENSATION: Light touch: Impaired  and intermittent n/t in feet up to shins    COORDINATION: WFL   EDEMA:  Mild edema in bil LE   MUSCLE TONE: WFL   POSTURE: rounded shoulders and forward head  LOWER EXTREMITY ROM:     Grossly WFL  LOWER EXTREMITY MMT:    MMT Right Eval Left Eval  Hip flexion 4 4  Hip extension    Hip abduction 4+ 4+  Hip adduction 4 4  Hip internal rotation    Hip external rotation    Knee flexion 4+ 4+  Knee extension 4+ 4+  Ankle dorsiflexion 4+ 4+  Ankle plantarflexion    Ankle inversion    Ankle eversion    (Blank rows = not tested)  BED MOBILITY:  Sit to supine Complete Independence Supine to sit Complete Independence  TRANSFERS: Assistive device utilized: None  Sit to stand: Complete Independence Stand to sit: Complete Independence Chair to chair: Complete Independence Floor: unable to complete .  Requires UE support topush from thighs to stand   RAMP:  Level of Assistance: Complete Independence Assistive device utilized:  Ramp Comments:   CURB:  Level of Assistance: Modified  independence Assistive device utilized: rail Curb Comments:   STAIRS: Level of Assistance: SBA Stair Negotiation Technique: Step to Pattern Alternating Pattern  with Single Rail on Right Number of Stairs: 4  Height of Stairs: 6  Comments: step to on descent  GAIT: Gait pattern: lateral hip instability, trunk flexed, and wide BOS Distance walked: 915 Assistive device utilized: None Level of assistance: Complete Independence Comments: increased flexed posture with increased distance  FUNCTIONAL TESTS:  5 times sit to stand: 19.11 with UE support; 21.44 with UE support on knees.  Timed up and go (TUG): 15.64, 13.74 sec  6 minute walk test: 929ft  10 meter walk test: 12.43 and 11.68 Berg Balance Scale: TBD Dynamic Gait Index: TBD  PATIENT SURVEYS:  ABC scale 60                                                                                                                              TREATMENT DATE: DATE: 10/06/23   Physical Performance Pt performed FGA to assess balance & fall risk: pt not considered at increased fall risk per score   [facility] PT Assessment - 10/06/23 0001       Functional Gait  Assessment   Gait Level Surface Walks 20 ft in less than 5.5 sec, no assistive devices, good speed, no evidence for imbalance, normal gait pattern, deviates no more than 6 in outside of the 12 in walkway width.    Change in Gait Speed Able to smoothly change walking speed without loss of balance or gait deviation. Deviate no more than 6 in outside of the 12 in walkway width.    Gait with Horizontal Head Turns Performs head turns smoothly with no change in gait. Deviates no more than 6 in outside 12 in walkway width    Gait with Vertical Head Turns Performs head turns with no change in gait. Deviates no more than 6 in outside 12 in walkway width.    Gait and Pivot Turn Pivot turns safely within 3 sec and stops quickly with no loss of balance.    Step Over Obstacle Is able to step over one  shoe box (4.5 in total height) but must slow down and adjust steps to clear box safely. May require verbal cueing.    Gait with Narrow Base of Support Ambulates 4-7 steps.    Gait with Eyes Closed Walks 20 ft, no assistive devices, good speed, no evidence of imbalance, normal gait pattern, deviates no more than 6 in outside 12 in walkway width. Ambulates 20 ft in less than 7 sec.    Ambulating Backwards Walks 20 ft, no assistive devices, good speed, no evidence for imbalance, normal gait    Steps Alternating feet, must use rail.    Total Score 25           TA:  -Sit to stand without UE support 1 x 12, 1x8 - initial difficulty with first reps, but no difficulty after   -Standing step up onto 6  block x 10 reps each LE-   -Standing hip ext with 2.5# AW 2 x 10 reps each LE  -Reverse gait  2.5# AW without AD  x  multiple reps near support bar  -Resistive Side step with 2.5# aw x multiple reps R and L near support bar   NMR:  Tandem balance 3x30 sec each LE   NBOS EC x 60 seconds - intemrittent UE support  Throughout session, CGA provided for safety with gait belt in place.   PATIENT EDUCATION: Education details: exercise technique, Pt educated throughout session about proper posture and technique with exercises. Improved exercise technique, movement at target joints, use of target muscles after min to mod verbal, visual, tactile cues.  Person educated: Patient Education method: Archivist, Demo Education comprehension: verbalized understanding, returned demo  HOME EXERCISE PROGRAM:  Access Code: URL: [email or web address] Date: 06/23/2023 Prepared by: Connell Kiss  Exercises - Doorway Pec Stretch at 60 Elevation  - 1 x daily - 7 x weekly - 2 sets - 1 minute hold - Standing Shoulder Row with Anchored Resistance  - 1 x daily - 7 x weekly - 3 sets - 10 reps - Low Trap Setting at Wall  - 1 x daily - 7 x weekly - 2 sets - 10 reps - Standing Tandem  Balance with Counter Support  - 3 x weekly - 2 sets - 30 sec hold - Standing Near Stance in Corner with Eyes Closed  - 1 x daily - 7 x weekly - 2 sets - 30 seconds hold - Standing Single Leg Stance with Counter Support  - 3 x weekly - 3-5 sets - as long as possible hold - Standing March with Counter Support  - 3 x weekly - 3 sets - 10 reps - Side Stepping with Counter Support  - 1 x daily - 3 x weekly - 3 sets - 3 reps - Backward Walking with Counter Support  - 1 x daily - 7 x weekly - 3 sets - 5 reps   GOALS: Goals reviewed with patient? Yes  SHORT TERM GOALS: Target date: 09/13/2023    Patient will be independent in home exercise program to improve strength/mobility for better functional independence with ADLs. Baseline: 06/07/23: HEP provided  06/16/2023: pt reports consistent compliance with HEP 06/23/2023: updated HEP provided Goal status: IN PROGRESS   LONG TERM GOALS: Target date: 10/25/2023   Patient will increase ABC score to equal to or greater than 72  to demonstrate statistically significant improvement in mobility and quality of life.  Baseline: 60 06/16/2023: 55% 07/26/23: 70.6%  09/04/23: 73.75% Goal status: MET - previously MET  2.  Patient (> 66 years old) will complete five times sit to stand test in < 15 seconds indicating an increased LE strength and improved balance. Baseline: 21.44 06/16/2023: 18.38 seconds no UE support 07/26/23: 15.55 seconds no UE support  09/01/23: 14.43 seconds no UE support Goal status: MET  3.  Patient will increase Berg Balance score by > 6 points to demonstrate decreased fall risk during functional activities Baseline: 1/30= 38/56 06/16/2023: 40/56 07/26/23: 42/56 09/01/23: 47/56 Goal status: MET  4.  Patient will increase 10 meter walk test to >1.45m/s as to improve gait speed for better community ambulation and to reduce fall risk. Baseline: 0.83 m/s 06/16/2023: 0.83 m/s (12 seconds) without AD 07/26/23: 0.96 m/s with no AD  09/01/23: 1.25  m/s with no AD (fast speed) Goal status: PREVIOUSLY MET  5.  Patient will reduce timed up and go to <11 seconds to reduce fall risk and demonstrate improved transfer/gait ability. Baseline: 14 sec  06/16/2023: 12.57 seconds no AD 07/26/23: 11.88 seconds no AD  09/01/23: 10.26 seconds no AD Goal status: MET  6.  Patient will increase dynamic gait index score to >19/24 as to demonstrate reduced fall risk and improved dynamic gait balance for better safety with community/home ambulation.  Baseline: 05/12/23: 17 06/16/2023: 17/24 07/26/23: 18/24 09/01/23: 21/24 Goal status: IN PROGRESS  7.  Patient will increase FGA score by > 4 points to demonstrate decreased fall risk during functional activities. Baseline: to perform at next visit; 09/06/2023= 23/30; 6/26: 25/30  Goal status: ONGOING   ASSESSMENT:  CLINICAL IMPRESSION:     Goal reassessment completed for progress note. Pt making gains AEB improving FGA score, but not quite yet at threshold to meet this remaining goal. Pt still with some observed difficulty performing balance activities in narrow stances, such as tandem stance.  Pt will continue to benefit from skilled therapy to address remaining deficits in order to improve overall QoL and return to PLOF.      OBJECTIVE IMPAIRMENTS: Abnormal gait, cardiopulmonary status limiting activity, decreased activity tolerance, decreased balance, decreased coordination, decreased endurance, decreased knowledge of use of DME, decreased mobility, difficulty walking, decreased ROM, decreased strength, hypomobility, impaired perceived functional ability, impaired flexibility, impaired sensation, improper body mechanics, postural dysfunction, and obesity.   ACTIVITY LIMITATIONS: carrying, lifting, bending, standing, squatting, stairs, transfers, and locomotion level  PARTICIPATION LIMITATIONS: cleaning, laundry, community activity, and occupation  PERSONAL FACTORS: Age and 1-2 comorbidities: obesity CHF  are also affecting patient's functional outcome.   REHAB POTENTIAL: Good  CLINICAL DECISION MAKING: Stable/uncomplicated  EVALUATION COMPLEXITY: Moderate  PLAN:  PT FREQUENCY: 1-2x/week  PT DURATION: 12 weeks  PLANNED INTERVENTIONS: 97110-Therapeutic exercises, 97530- Therapeutic activity, 97112- Neuromuscular re-education, 97535- Self Care, [id number]- Manual therapy, [phone]- Gait training, Patient/Family education, Balance training, Stair training, Taping, DME instructions, Cryotherapy, and Moist heat  PLAN FOR NEXT SESSION:  Dynamic balance interventions Fwd/back stepping over 1/2 foam roll vs hurdle with blaze pod dual-task  Dynamic gait training including: quick turning  stepping over obstacles agility ladder BLE strengthening  Darryle Patten PT, DPT  Physical Therapist - [facility]  [facility]  11:02 AM 10/06/23

## 2023-10-07 ENCOUNTER — Other Ambulatory Visit: Payer: Self-pay

## 2023-10-10 ENCOUNTER — Other Ambulatory Visit (HOSPITAL_BASED_OUTPATIENT_CLINIC_OR_DEPARTMENT_OTHER): Payer: Self-pay

## 2023-10-11 ENCOUNTER — Ambulatory Visit: Attending: Internal Medicine | Admitting: Physical Therapy

## 2023-10-11 DIAGNOSIS — M6281 Muscle weakness (generalized): Secondary | ICD-10-CM | POA: Insufficient documentation

## 2023-10-11 DIAGNOSIS — R2689 Other abnormalities of gait and mobility: Secondary | ICD-10-CM | POA: Insufficient documentation

## 2023-10-11 DIAGNOSIS — R2681 Unsteadiness on feet: Secondary | ICD-10-CM | POA: Diagnosis not present

## 2023-10-11 DIAGNOSIS — R262 Difficulty in walking, not elsewhere classified: Secondary | ICD-10-CM | POA: Insufficient documentation

## 2023-10-11 NOTE — Therapy (Signed)
 OUTPATIENT PHYSICAL THERAPY NEURO TREATMENT   Patient Name: Anthony Weber MRN: [id number] DOB:[birth year], 78 y.o., male Today's Date: 10/11/2023   PCP: Corlis Honor BROCKS, MD  REFERRING PROVIDER: Cherrie Toribio SAUNDERS, MD   END OF SESSION: ***  PT End of Session - 10/11/23 1104     Visit Number 41    Number of Visits 46    Date for PT Re-Evaluation 10/25/23    Progress Note Due on Visit 40    PT Start Time 1107    PT Stop Time 1150    PT Time Calculation (min) 43 min    Equipment Utilized During Treatment Gait belt    Activity Tolerance Patient tolerated treatment well    Behavior During Therapy WFL for tasks assessed/performed               Past Medical History:  Diagnosis Date   Depression    Hypertension    Hypothyroidism    Past Surgical History:  Procedure Laterality Date   COLONOSCOPY WITH PROPOFOL  N/A 03/03/2015   Procedure: COLONOSCOPY WITH PROPOFOL ;  Surgeon: Lamar ONEIDA Holmes, MD;  Location: [facility] ENDOSCOPY;  Service: Endoscopy;  Laterality: N/A;   EYE SURGERY     RIGHT HEART CATH N/A 08/08/2023   Procedure: RIGHT HEART CATH;  Surgeon: Cherrie Toribio SAUNDERS, MD;  Location: [facility] INVASIVE CV LAB;  Service: Cardiovascular;  Laterality: N/A;   RIGHT/LEFT HEART CATH AND CORONARY ANGIOGRAPHY N/A 01/27/2021   Procedure: RIGHT/LEFT HEART CATH AND CORONARY ANGIOGRAPHY;  Surgeon: Mady Bruckner, MD;  Location: [facility] INVASIVE CV LAB;  Service: Cardiovascular;  Laterality: N/A;   TEE WITHOUT CARDIOVERSION N/A 02/05/2021   Procedure: TRANSESOPHAGEAL ECHOCARDIOGRAM (TEE);  Surgeon: Cherrie Toribio SAUNDERS, MD;  Location: [facility] ENDOSCOPY;  Service: Cardiovascular;  Laterality: N/A;   Patient Active Problem List   Diagnosis Date Noted   Hyponatremia    Acute on chronic combined systolic and diastolic congestive heart failure (HCC)    Acute on chronic respiratory failure with hypoxia and hypercapnia (HCC)    MSSA bacteremia 02/03/2021   Central line infection    Pneumonia of  both lower lobes due to methicillin susceptible Staphylococcus aureus (MSSA) (HCC)    Cardiogenic shock (HCC) 01/29/2021   Primary hypertension    Acute HFrEF (heart failure with reduced ejection fraction) (HCC)    Acute CHF (congestive heart failure) (HCC) 01/18/2021    ONSET DATE: 3 years.   REFERRING DIAG:  Diagnosis  R26.89 (ICD-10-CM) - Imbalance    THERAPY DIAG:  Unsteadiness on feet  Muscle weakness (generalized)  Difficulty in walking, not elsewhere classified  Imbalance  Balance disorder  Rationale for Evaluation and Treatment: Rehabilitation  SUBJECTIVE:  SUBJECTIVE STATEMENT:    *** Pt reports he is doing good. Denies pain. Denies stumbles/falls. Pt states when he feels like it he does his HEP, but reports lately he has been tired in the evening.   Pt accompanied by: self  PERTINENT HISTORY:   From recent MD appointment. Admitted to [facility] 01/2021  acute HFrEF. Echo  EF 25-30% from previous EF 60-65%. R/LHC w/ no CAD, moderately elevated left heart and pulmonary artery pressures, severely elevated right heart filling pressures, severely reduced Fick cardiac output/index.  Required intropes, pressor, and lasix  drip.  Transferred to [facility] for cardiogenic/septic shock, + MSSA bacteremia. Underwent TEE showing EF 25-30%, RV moderately down, severe biatrial enlargement and moderate MR, no valvular vegetation. Required addition of mexitiline to suppress PVCs. Hospitalization c/b AKI and hypoxic/hypercarbic respiratory failure.   Discharged from [facility] 03/11/21 after he completed IV antibiotics and rehab.    At last visit midodrine  stopped and GDMT started.    Sleep study 4/23 AHI 2   Here for routine f/u. Here for f/u with his wife. Says he is doing pretty well. 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 Walking 1 mile at the [place] almost everyday. And riding stationary bike for 30 mins every night.  PAIN:  Are you having pain? No  PRECAUTIONS: None  RED FLAGS: None   WEIGHT BEARING RESTRICTIONS: No  FALLS: Has patient fallen in last 6 months? No  LIVING ENVIRONMENT: Lives with: lives with their spouse Lives in: House/apartment Stairs: Yes: Internal: 12 steps; on right going up and on left going up and External: 1 steps; on right going up and on left going up Has following equipment at home: Single point cane and does not use often    PLOF: Independent, Independent with basic ADLs, Independent with gait, and Independent with transfers  PATIENT GOALS: improve balance and strength in legs.   OBJECTIVE:  Note: Objective measures were completed at Evaluation unless otherwise noted.  DIAGNOSTIC FINDINGS:  None relevant   COGNITION: Overall cognitive status: Within functional limits for tasks assessed   SENSATION: Light touch: Impaired  and intermittent n/t in feet up to shins    COORDINATION: WFL   EDEMA:  Mild edema in bil LE   MUSCLE TONE: WFL   POSTURE: rounded shoulders and forward head  LOWER EXTREMITY ROM:     Grossly WFL  LOWER EXTREMITY MMT:    MMT Right Eval Left Eval  Hip flexion 4 4  Hip extension    Hip abduction 4+ 4+  Hip adduction 4 4  Hip internal rotation    Hip external rotation    Knee flexion 4+ 4+  Knee extension 4+ 4+  Ankle dorsiflexion 4+ 4+  Ankle plantarflexion    Ankle inversion    Ankle eversion    (Blank rows = not tested)  BED MOBILITY:  Sit to supine Complete Independence Supine to sit Complete Independence  TRANSFERS: Assistive device utilized: None  Sit to stand: Complete Independence Stand to sit: Complete Independence Chair to chair: Complete Independence Floor: unable to complete .  Requires UE support topush from thighs to stand   RAMP:  Level of Assistance: Complete  Independence Assistive device utilized:  Ramp Comments:   CURB:  Level of Assistance: Modified independence Assistive device utilized: rail Curb Comments:   STAIRS: Level of Assistance: SBA Stair Negotiation Technique: Step to Pattern Alternating Pattern  with Single Rail on Right Number of Stairs: 4  Height of Stairs: 6  Comments: step  to on descent  GAIT: Gait pattern: lateral hip instability, trunk flexed, and wide BOS Distance walked: 915 Assistive device utilized: None Level of assistance: Complete Independence Comments: increased flexed posture with increased distance  FUNCTIONAL TESTS:  5 times sit to stand: 19.11 with UE support; 21.44 with UE support on knees.  Timed up and go (TUG): 15.64, 13.74 sec  6 minute walk test: 9100ft  10 meter walk test: 12.43 and 11.68 Berg Balance Scale: TBD Dynamic Gait Index: TBD  PATIENT SURVEYS:  ABC scale 60                                                                                                                              TREATMENT DATE: DATE: 10/11/23  ***  Dynamic balance interventions Fwd/back stepping over 1/2 foam roll vs hurdle with blaze pod dual-task  Dynamic gait training including: quick turning  stepping over obstacles agility ladder BLE strengthening ***  Dynamic gait training 377ft, no UE, with CGA for steadying. Pt demonstrating the following gait deviations with therapist providing the described cuing and facilitation for improvement:  Improving upright posture although still with some thoracic kyphosis with downward gaze Progressed to fast forward/backwards gait ~46ft each way x3 reps Cuing for reciprocal pattern during backwards gait Transitioned to sudden start/stops during forward/backwards ***    TA:  Sit to stand without UE support 2 x 12reps- initial difficulty with first reps, but no difficulty after  Cuing for increased anterior trunk flexion during descent to improve eccentric  control ***  Step-ups onto green step with 1x purple plate Requires L UE support on mat to be successful Requires min/mod A for lifting/balance Noticed significant weakness in L LE hip/glutes Side stepping ~10steps down/back 3 reps with GTB resistance around knees Requires min A for balance when not using UE support ***Standing hip abduction       Throughout session, CGA provided for safety with gait belt in place.   ***  PATIENT EDUCATION: Education details: exercise technique, Pt educated throughout session about proper posture and technique with exercises. Improved exercise technique, movement at target joints, use of target muscles after min to mod verbal, visual, tactile cues.  Person educated: Patient Education method: Archivist, Demo Education comprehension: verbalized understanding, returned demo  HOME EXERCISE PROGRAM:  Access Code: URL: [email or web address] Date: 06/23/2023 Prepared by: Connell Kiss  Exercises - Doorway Pec Stretch at 60 Elevation  - 1 x daily - 7 x weekly - 2 sets - 1 minute hold - Standing Shoulder Row with Anchored Resistance  - 1 x daily - 7 x weekly - 3 sets - 10 reps - Low Trap Setting at Wall  - 1 x daily - 7 x weekly - 2 sets - 10 reps - Standing Tandem Balance with Counter Support  - 3 x weekly - 2 sets - 30 sec hold - Standing Near Stance in Corner with Eyes Closed  - 1 x daily -  7 x weekly - 2 sets - 30 seconds hold - Standing Single Leg Stance with Counter Support  - 3 x weekly - 3-5 sets - as long as possible hold - Standing March with Counter Support  - 3 x weekly - 3 sets - 10 reps - Side Stepping with Counter Support  - 1 x daily - 3 x weekly - 3 sets - 3 reps - Backward Walking with Counter Support  - 1 x daily - 7 x weekly - 3 sets - 5 reps   GOALS: Goals reviewed with patient? Yes  SHORT TERM GOALS: Target date: 09/13/2023    Patient will be independent in home exercise program to improve  strength/mobility for better functional independence with ADLs. Baseline: 06/07/23: HEP provided  06/16/2023: pt reports consistent compliance with HEP 06/23/2023: updated HEP provided Goal status: IN PROGRESS   LONG TERM GOALS: Target date: 10/25/2023   Patient will increase ABC score to equal to or greater than 72  to demonstrate statistically significant improvement in mobility and quality of life.  Baseline: 60 06/16/2023: 55% 07/26/23: 70.6%  09/04/23: 73.75% Goal status: MET - previously MET  2.  Patient (> 63 years old) will complete five times sit to stand test in < 15 seconds indicating an increased LE strength and improved balance. Baseline: 21.44 06/16/2023: 18.38 seconds no UE support 07/26/23: 15.55 seconds no UE support  09/01/23: 14.43 seconds no UE support Goal status: MET  3.  Patient will increase Berg Balance score by > 6 points to demonstrate decreased fall risk during functional activities Baseline: 1/30= 38/56 06/16/2023: 40/56 07/26/23: 42/56 09/01/23: 47/56 Goal status: MET  4.  Patient will increase 10 meter walk test to >1.74m/s as to improve gait speed for better community ambulation and to reduce fall risk. Baseline: 0.83 m/s 06/16/2023: 0.83 m/s (12 seconds) without AD 07/26/23: 0.96 m/s with no AD  09/01/23: 1.25 m/s with no AD (fast speed) Goal status: PREVIOUSLY MET  5.  Patient will reduce timed up and go to <11 seconds to reduce fall risk and demonstrate improved transfer/gait ability. Baseline: 14 sec  06/16/2023: 12.57 seconds no AD 07/26/23: 11.88 seconds no AD  09/01/23: 10.26 seconds no AD Goal status: MET  6.  Patient will increase dynamic gait index score to >19/24 as to demonstrate reduced fall risk and improved dynamic gait balance for better safety with community/home ambulation.  Baseline: 05/12/23: 17 06/16/2023: 17/24 07/26/23: 18/24 09/01/23: 21/24 Goal status: IN PROGRESS  7.  Patient will increase FGA score by > 4 points to demonstrate decreased  fall risk during functional activities. Baseline: to perform at next visit; 09/06/2023= 23/30; 6/26: 25/30  Goal status: ONGOING   ASSESSMENT:  CLINICAL IMPRESSION:   ***  Goal reassessment completed for progress note. Pt making gains AEB improving FGA score, but not quite yet at threshold to meet this remaining goal. Pt still with some observed difficulty performing balance activities in narrow stances, such as tandem stance.  Pt will continue to benefit from skilled therapy to address remaining deficits in order to improve overall QoL and return to PLOF.      OBJECTIVE IMPAIRMENTS: Abnormal gait, cardiopulmonary status limiting activity, decreased activity tolerance, decreased balance, decreased coordination, decreased endurance, decreased knowledge of use of DME, decreased mobility, difficulty walking, decreased ROM, decreased strength, hypomobility, impaired perceived functional ability, impaired flexibility, impaired sensation, improper body mechanics, postural dysfunction, and obesity.   ACTIVITY LIMITATIONS: carrying, lifting, bending, standing, squatting, stairs, transfers, and locomotion level  PARTICIPATION LIMITATIONS: cleaning, laundry, community activity, and occupation  PERSONAL FACTORS: Age and 1-2 comorbidities: obesity CHF are also affecting patient's functional outcome.   REHAB POTENTIAL: Good  CLINICAL DECISION MAKING: Stable/uncomplicated  EVALUATION COMPLEXITY: Moderate  PLAN:  PT FREQUENCY: 1-2x/week  PT DURATION: 12 weeks  PLANNED INTERVENTIONS: 97110-Therapeutic exercises, 97530- Therapeutic activity, 97112- Neuromuscular re-education, 97535- Self Care, [id number]- Manual therapy, [phone]- Gait training, Patient/Family education, Balance training, Stair training, Taping, DME instructions, Cryotherapy, and Moist heat  PLAN FOR NEXT SESSION:  *** Dynamic balance interventions Fwd/back stepping over 1/2 foam roll vs hurdle with blaze pod dual-task  Dynamic gait  training including: quick turning  stepping over obstacles agility ladder BLE strengthening   Jenesis Suchy, PT, DPT, NCS, CSRS Physical Therapist - [facility]  11:52 AM 10/11/23

## 2023-10-13 ENCOUNTER — Ambulatory Visit: Admitting: Physical Therapy

## 2023-10-13 DIAGNOSIS — M6281 Muscle weakness (generalized): Secondary | ICD-10-CM

## 2023-10-13 DIAGNOSIS — R2681 Unsteadiness on feet: Secondary | ICD-10-CM | POA: Diagnosis not present

## 2023-10-13 DIAGNOSIS — R262 Difficulty in walking, not elsewhere classified: Secondary | ICD-10-CM

## 2023-10-13 DIAGNOSIS — R2689 Other abnormalities of gait and mobility: Secondary | ICD-10-CM

## 2023-10-13 NOTE — Therapy (Signed)
 OUTPATIENT PHYSICAL THERAPY NEURO TREATMENT   Patient Name: Anthony Weber MRN: [id number] DOB:02-01-1946, 78 y.o., male Today's Date: 10/13/2023   PCP: Corlis Honor BROCKS, MD  REFERRING PROVIDER: Cherrie Toribio SAUNDERS, MD   END OF SESSION:   PT End of Session - 10/13/23 1103     Visit Number 42    Number of Visits 46    Date for PT Re-Evaluation 10/25/23    Progress Note Due on Visit 40    PT Start Time 1105    PT Stop Time 1145    PT Time Calculation (min) 40 min    Equipment Utilized During Treatment Gait belt    Activity Tolerance Patient tolerated treatment well    Behavior During Therapy WFL for tasks assessed/performed           Past Medical History:  Diagnosis Date   Depression    Hypertension    Hypothyroidism    Past Surgical History:  Procedure Laterality Date   COLONOSCOPY WITH PROPOFOL  N/A 03/03/2015   Procedure: COLONOSCOPY WITH PROPOFOL ;  Surgeon: Lamar ONEIDA Holmes, MD;  Location: [facility] ENDOSCOPY;  Service: Endoscopy;  Laterality: N/A;   EYE SURGERY     RIGHT HEART CATH N/A 08/08/2023   Procedure: RIGHT HEART CATH;  Surgeon: Cherrie Toribio SAUNDERS, MD;  Location: [facility] INVASIVE CV LAB;  Service: Cardiovascular;  Laterality: N/A;   RIGHT/LEFT HEART CATH AND CORONARY ANGIOGRAPHY N/A 01/27/2021   Procedure: RIGHT/LEFT HEART CATH AND CORONARY ANGIOGRAPHY;  Surgeon: Mady Bruckner, MD;  Location: [facility] INVASIVE CV LAB;  Service: Cardiovascular;  Laterality: N/A;   TEE WITHOUT CARDIOVERSION N/A 02/05/2021   Procedure: TRANSESOPHAGEAL ECHOCARDIOGRAM (TEE);  Surgeon: Cherrie Toribio SAUNDERS, MD;  Location: [facility] ENDOSCOPY;  Service: Cardiovascular;  Laterality: N/A;   Patient Active Problem List   Diagnosis Date Noted   Hyponatremia    Acute on chronic combined systolic and diastolic congestive heart failure (HCC)    Acute on chronic respiratory failure with hypoxia and hypercapnia (HCC)    MSSA bacteremia 02/03/2021   Central line infection    Pneumonia of both lower  lobes due to methicillin susceptible Staphylococcus aureus (MSSA) (HCC)    Cardiogenic shock (HCC) 01/29/2021   Primary hypertension    Acute HFrEF (heart failure with reduced ejection fraction) (HCC)    Acute CHF (congestive heart failure) (HCC) 01/18/2021    ONSET DATE: 3 years.   REFERRING DIAG:  Diagnosis  R26.89 (ICD-10-CM) - Imbalance    THERAPY DIAG:  Unsteadiness on feet  Muscle weakness (generalized)  Difficulty in walking, not elsewhere classified  Balance disorder  Imbalance  Rationale for Evaluation and Treatment: Rehabilitation  SUBJECTIVE:  SUBJECTIVE STATEMENT:    Pt reports he is doing good. Denies stumbles/falls. Denies pain. Pt motivated to participate in therapy session.  Pt accompanied by: self  PERTINENT HISTORY:   From recent MD appointment. Admitted to [facility] 01/2021  acute HFrEF. Echo  EF 25-30% from previous EF 60-65%. R/LHC w/ no CAD, moderately elevated left heart and pulmonary artery pressures, severely elevated right heart filling pressures, severely reduced Fick cardiac output/index.  Required intropes, pressor, and lasix  drip.  Transferred to [facility] for cardiogenic/septic shock, + MSSA bacteremia. Underwent TEE showing EF 25-30%, RV moderately down, severe biatrial enlargement and moderate MR, no valvular vegetation. Required addition of mexitiline to suppress PVCs. Hospitalization c/b AKI and hypoxic/hypercarbic respiratory failure.   Discharged from [facility] 03/11/21 after he completed IV antibiotics and rehab.    At last visit midodrine  stopped and GDMT started.    Sleep study 4/23 AHI 2   Here for routine f/u. Here for f/u with his wife. Says he is doing pretty well. 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 Walking 1 mile at the [place] almost everyday. And riding  stationary bike for 30 mins every night.  PAIN:  Are you having pain? No  PRECAUTIONS: None  RED FLAGS: None   WEIGHT BEARING RESTRICTIONS: No  FALLS: Has patient fallen in last 6 months? No  LIVING ENVIRONMENT: Lives with: lives with their spouse Lives in: House/apartment Stairs: Yes: Internal: 12 steps; on right going up and on left going up and External: 1 steps; on right going up and on left going up Has following equipment at home: Single point cane and does not use often    PLOF: Independent, Independent with basic ADLs, Independent with gait, and Independent with transfers  PATIENT GOALS: improve balance and strength in legs.   OBJECTIVE:  Note: Objective measures were completed at Evaluation unless otherwise noted.  DIAGNOSTIC FINDINGS:  None relevant   COGNITION: Overall cognitive status: Within functional limits for tasks assessed   SENSATION: Light touch: Impaired  and intermittent n/t in feet up to shins    COORDINATION: WFL   EDEMA:  Mild edema in bil LE   MUSCLE TONE: WFL   POSTURE: rounded shoulders and forward head  LOWER EXTREMITY ROM:     Grossly WFL  LOWER EXTREMITY MMT:    MMT Right Eval Left Eval  Hip flexion 4 4  Hip extension    Hip abduction 4+ 4+  Hip adduction 4 4  Hip internal rotation    Hip external rotation    Knee flexion 4+ 4+  Knee extension 4+ 4+  Ankle dorsiflexion 4+ 4+  Ankle plantarflexion    Ankle inversion    Ankle eversion    (Blank rows = not tested)  BED MOBILITY:  Sit to supine Complete Independence Supine to sit Complete Independence  TRANSFERS: Assistive device utilized: None  Sit to stand: Complete Independence Stand to sit: Complete Independence Chair to chair: Complete Independence Floor: unable to complete .  Requires UE support topush from thighs to stand   RAMP:  Level of Assistance: Complete Independence Assistive device utilized:  Ramp Comments:   CURB:  Level of Assistance:  Modified independence Assistive device utilized: rail Curb Comments:   STAIRS: Level of Assistance: SBA Stair Negotiation Technique: Step to Pattern Alternating Pattern  with Single Rail on Right Number of Stairs: 4  Height of Stairs: 6  Comments: step to on descent  GAIT: Gait pattern: lateral hip instability, trunk flexed, and wide BOS Distance  walked: 915 Assistive device utilized: None Level of assistance: Complete Independence Comments: increased flexed posture with increased distance  FUNCTIONAL TESTS:  5 times sit to stand: 19.11 with UE support; 21.44 with UE support on knees.  Timed up and go (TUG): 15.64, 13.74 sec  6 minute walk test: 945ft  10 meter walk test: 12.43 and 11.68 Berg Balance Scale: TBD Dynamic Gait Index: TBD  PATIENT SURVEYS:  ABC scale 60                                                                                                                              TREATMENT DATE: DATE: 10/13/23  Unless otherwise stated, CGA was provided and gait belt donned in order to ensure pt safety throughout session.  Dynamic gait training 358ft, no UE support, with CGA/intermittent min A for steadying/balance during the below dual-task challenges: R/L head turning to visually scan walls and call out numbers/letters on post-it notes Progressed this to backwards gait while continuing intermittent head turning/visual scanning Has greatest LOB during backwards gait when looking up towards L Included ~30ft side stepping in each direction Pt reports side stepping as challenging Pt demos improving upright posture although still with some thoracic kyphosis and downward gaze bias. Cuing for reciprocal pattern during backwards gait Transitioned to sudden start/stops during backwards gait to challenge pt's ability to spontaneously stop and control his momentum This was a good challenge for the patient   B LE functional strengthening and dynamic balance  interventions: Sit to stands from green chair without UE support x 12reps Difficulty with first couple reps, but improving after  Improved eccentric control today Progressed to holding 5lb dumbbell in each hand x10reps Cuing to maintain scapular retraction and trunk extension This was a good challenge for patient Side stepping ~10steps down/back x3 reps with GTB resistance around knees Requires min A for balance when not using UE support Cuing for increased hip abductor activation  Cuing to maintain upright posture Standing hip extension with B UE support on mat  RTB resistance 2 x12 reps per LE Cuing for upright posture and not compensating with forward trunk lean Step-ups onto green step with 1x purple plate - x8 reps per LE Requires L UE support on mat to be successful Requires min A when stepping up with R LE and then up to mod A for lifting/balance when stepping up leading with L LE Cuing for L quad and glutes to fire simultaneously to improve ability to power-up when stepping up leading with  L UE Noticed significant weakness in L LE hip/glutes compared to R LE  Standing scapular retractions at cable machine: X15reps with 12.5lb Increased to 17.5lb additional x10reps Goal of increased repetition to address muscular endurance to maintain upright posture during functional mobility  Requires max cuing for proper form/technique to avoid shoulder elevation      PATIENT EDUCATION: Education details: exercise technique, Pt educated throughout session about proper posture and technique with  exercises. Improved exercise technique, movement at target joints, use of target muscles after min to mod verbal, visual, tactile cues.  Person educated: Patient Education method: Archivist, Demo Education comprehension: verbalized understanding, returned demo  HOME EXERCISE PROGRAM:  Access Code: URL: [email or web address] Date: 06/23/2023 Prepared by: Connell Kiss  Exercises - Doorway Pec Stretch at 60 Elevation  - 1 x daily - 7 x weekly - 2 sets - 1 minute hold - Standing Shoulder Row with Anchored Resistance  - 1 x daily - 7 x weekly - 3 sets - 10 reps - Low Trap Setting at Wall  - 1 x daily - 7 x weekly - 2 sets - 10 reps - Standing Tandem Balance with Counter Support  - 3 x weekly - 2 sets - 30 sec hold - Standing Near Stance in Corner with Eyes Closed  - 1 x daily - 7 x weekly - 2 sets - 30 seconds hold - Standing Single Leg Stance with Counter Support  - 3 x weekly - 3-5 sets - as long as possible hold - Standing March with Counter Support  - 3 x weekly - 3 sets - 10 reps - Side Stepping with Counter Support  - 1 x daily - 3 x weekly - 3 sets - 3 reps - Backward Walking with Counter Support  - 1 x daily - 7 x weekly - 3 sets - 5 reps   GOALS: Goals reviewed with patient? Yes  SHORT TERM GOALS: Target date: 09/13/2023    Patient will be independent in home exercise program to improve strength/mobility for better functional independence with ADLs. Baseline: 06/07/23: HEP provided  06/16/2023: pt reports consistent compliance with HEP 06/23/2023: updated HEP provided Goal status: IN PROGRESS   LONG TERM GOALS: Target date: 10/25/2023   Patient will increase ABC score to equal to or greater than 72  to demonstrate statistically significant improvement in mobility and quality of life.  Baseline: 60 06/16/2023: 55% 07/26/23: 70.6%  09/04/23: 73.75% Goal status: MET - previously MET  2.  Patient (> 33 years old) will complete five times sit to stand test in < 15 seconds indicating an increased LE strength and improved balance. Baseline: 21.44 06/16/2023: 18.38 seconds no UE support 07/26/23: 15.55 seconds no UE support  09/01/23: 14.43 seconds no UE support Goal status: MET  3.  Patient will increase Berg Balance score by > 6 points to demonstrate decreased fall risk during functional activities Baseline: 1/30= 38/56 06/16/2023:  40/56 07/26/23: 42/56 09/01/23: 47/56 Goal status: MET  4.  Patient will increase 10 meter walk test to >1.25m/s as to improve gait speed for better community ambulation and to reduce fall risk. Baseline: 0.83 m/s 06/16/2023: 0.83 m/s (12 seconds) without AD 07/26/23: 0.96 m/s with no AD  09/01/23: 1.25 m/s with no AD (fast speed) Goal status: PREVIOUSLY MET  5.  Patient will reduce timed up and go to <11 seconds to reduce fall risk and demonstrate improved transfer/gait ability. Baseline: 14 sec  06/16/2023: 12.57 seconds no AD 07/26/23: 11.88 seconds no AD  09/01/23: 10.26 seconds no AD Goal status: MET  6.  Patient will increase dynamic gait index score to >19/24 as to demonstrate reduced fall risk and improved dynamic gait balance for better safety with community/home ambulation.  Baseline: 05/12/23: 17 06/16/2023: 17/24 07/26/23: 18/24 09/01/23: 21/24 Goal status: IN PROGRESS  7.  Patient will increase FGA score by > 4 points to demonstrate decreased fall risk during functional activities. Baseline:  to perform at next visit; 09/06/2023= 23/30; 6/26: 25/30  Goal status: ONGOING   ASSESSMENT:  CLINICAL IMPRESSION:   Therapy session focused on continued dynamic gait training with pt noticed to have shorter steps during backwards ambulation and challenged with spontaneous start/stopping of forward/backwards gait. Patient able to progress to head turning with visual scanning during backwards gait with greatest challenge when looking up towards L. Patient participated in BLE functional strengthening and dynamic balance interventions with pt noted to have significant L hip weakness compared to R LE and will benefit from continuation of exercises to address this. Patient able to progress his strengthening exercises with increased repetitions and/or resistance today. Pt will continue to benefit from skilled therapy to address remaining deficits in order to improve overall QoL and return to PLOF.       OBJECTIVE IMPAIRMENTS: Abnormal gait, cardiopulmonary status limiting activity, decreased activity tolerance, decreased balance, decreased coordination, decreased endurance, decreased knowledge of use of DME, decreased mobility, difficulty walking, decreased ROM, decreased strength, hypomobility, impaired perceived functional ability, impaired flexibility, impaired sensation, improper body mechanics, postural dysfunction, and obesity.   ACTIVITY LIMITATIONS: carrying, lifting, bending, standing, squatting, stairs, transfers, and locomotion level  PARTICIPATION LIMITATIONS: cleaning, laundry, community activity, and occupation  PERSONAL FACTORS: Age and 1-2 comorbidities: obesity CHF are also affecting patient's functional outcome.   REHAB POTENTIAL: Good  CLINICAL DECISION MAKING: Stable/uncomplicated  EVALUATION COMPLEXITY: Moderate  PLAN:  PT FREQUENCY: 1-2x/week  PT DURATION: 12 weeks  PLANNED INTERVENTIONS: 97110-Therapeutic exercises, 97530- Therapeutic activity, 97112- Neuromuscular re-education, 97535- Self Care, [id number]- Manual therapy, [phone]- Gait training, Patient/Family education, Balance training, Stair training, Taping, DME instructions, Cryotherapy, and Moist heat  PLAN FOR NEXT SESSION:  Dynamic balance interventions Fwd/back stepping over 1/2 foam roll vs hurdle with blaze pod dual-task  Tandem stance Dynamic gait training including: quick turning  stepping over obstacles agility ladder BLE strengthening L hip strengthening - glutes and hip abductors Continue   Jeray Shugart, PT, DPT, NCS, CSRS Physical Therapist - [place]  [facility]  11:47 AM 10/13/23

## 2023-10-18 ENCOUNTER — Ambulatory Visit

## 2023-10-18 DIAGNOSIS — M6281 Muscle weakness (generalized): Secondary | ICD-10-CM

## 2023-10-18 DIAGNOSIS — R2681 Unsteadiness on feet: Secondary | ICD-10-CM

## 2023-10-18 DIAGNOSIS — R262 Difficulty in walking, not elsewhere classified: Secondary | ICD-10-CM

## 2023-10-18 DIAGNOSIS — R2689 Other abnormalities of gait and mobility: Secondary | ICD-10-CM

## 2023-10-18 NOTE — Therapy (Signed)
 OUTPATIENT PHYSICAL THERAPY NEURO TREATMENT   Patient Name: Andri Prestia MRN: [id number] DOB:30-Mar-1946, 78 y.o., male Today's Date: 10/18/2023   PCP: Corlis Honor BROCKS, MD  REFERRING PROVIDER: Cherrie Toribio SAUNDERS, MD   END OF SESSION:   PT End of Session - 10/18/23 1213     Visit Number 43    Number of Visits 46    Date for PT Re-Evaluation 10/25/23    Progress Note Due on Visit 50    PT Start Time 1147    PT Stop Time 1228    PT Time Calculation (min) 41 min    Equipment Utilized During Treatment Gait belt    Activity Tolerance Patient tolerated treatment well    Behavior During Therapy WFL for tasks assessed/performed           Past Medical History:  Diagnosis Date   Depression    Hypertension    Hypothyroidism    Past Surgical History:  Procedure Laterality Date   COLONOSCOPY WITH PROPOFOL  N/A 03/03/2015   Procedure: COLONOSCOPY WITH PROPOFOL ;  Surgeon: Lamar ONEIDA Holmes, MD;  Location: [facility] ENDOSCOPY;  Service: Endoscopy;  Laterality: N/A;   EYE SURGERY     RIGHT HEART CATH N/A 08/08/2023   Procedure: RIGHT HEART CATH;  Surgeon: Cherrie Toribio SAUNDERS, MD;  Location: [facility] INVASIVE CV LAB;  Service: Cardiovascular;  Laterality: N/A;   RIGHT/LEFT HEART CATH AND CORONARY ANGIOGRAPHY N/A 01/27/2021   Procedure: RIGHT/LEFT HEART CATH AND CORONARY ANGIOGRAPHY;  Surgeon: Mady Bruckner, MD;  Location: [facility] INVASIVE CV LAB;  Service: Cardiovascular;  Laterality: N/A;   TEE WITHOUT CARDIOVERSION N/A 02/05/2021   Procedure: TRANSESOPHAGEAL ECHOCARDIOGRAM (TEE);  Surgeon: Cherrie Toribio SAUNDERS, MD;  Location: [facility] ENDOSCOPY;  Service: Cardiovascular;  Laterality: N/A;   Patient Active Problem List   Diagnosis Date Noted   Hyponatremia    Acute on chronic combined systolic and diastolic congestive heart failure (HCC)    Acute on chronic respiratory failure with hypoxia and hypercapnia (HCC)    MSSA bacteremia 02/03/2021   Central line infection    Pneumonia of both lower  lobes due to methicillin susceptible Staphylococcus aureus (MSSA) (HCC)    Cardiogenic shock (HCC) 01/29/2021   Primary hypertension    Acute HFrEF (heart failure with reduced ejection fraction) (HCC)    Acute CHF (congestive heart failure) (HCC) 01/18/2021    ONSET DATE: 3 years.   REFERRING DIAG:  Diagnosis  R26.89 (ICD-10-CM) - Imbalance    THERAPY DIAG:  Unsteadiness on feet  Muscle weakness (generalized)  Difficulty in walking, not elsewhere classified  Balance disorder  Imbalance  Rationale for Evaluation and Treatment: Rehabilitation  SUBJECTIVE:  SUBJECTIVE STATEMENT:    Pt reports he is doing well. No falls and states doing some of his HEP at work when he can.  Pt accompanied by: self  PERTINENT HISTORY:   From recent MD appointment. Admitted to [facility] 01/2021  acute HFrEF. Echo  EF 25-30% from previous EF 60-65%. R/LHC w/ no CAD, moderately elevated left heart and pulmonary artery pressures, severely elevated right heart filling pressures, severely reduced Fick cardiac output/index.  Required intropes, pressor, and lasix  drip.  Transferred to [facility] for cardiogenic/septic shock, + MSSA bacteremia. Underwent TEE showing EF 25-30%, RV moderately down, severe biatrial enlargement and moderate MR, no valvular vegetation. Required addition of mexitiline to suppress PVCs. Hospitalization c/b AKI and hypoxic/hypercarbic respiratory failure.   Discharged from [facility] 03/11/21 after he completed IV antibiotics and rehab.    At last visit midodrine  stopped and GDMT started.    Sleep study 4/23 AHI 2   Here for routine f/u. Here for f/u with his wife. Says he is doing pretty well. 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 Walking 1 mile at the [place] almost everyday. And riding stationary bike for 30  mins every night.  PAIN:  Are you having pain? No  PRECAUTIONS: None  RED FLAGS: None   WEIGHT BEARING RESTRICTIONS: No  FALLS: Has patient fallen in last 6 months? No  LIVING ENVIRONMENT: Lives with: lives with their spouse Lives in: House/apartment Stairs: Yes: Internal: 12 steps; on right going up and on left going up and External: 1 steps; on right going up and on left going up Has following equipment at home: Single point cane and does not use often    PLOF: Independent, Independent with basic ADLs, Independent with gait, and Independent with transfers  PATIENT GOALS: improve balance and strength in legs.   OBJECTIVE:  Note: Objective measures were completed at Evaluation unless otherwise noted.  DIAGNOSTIC FINDINGS:  None relevant   COGNITION: Overall cognitive status: Within functional limits for tasks assessed   SENSATION: Light touch: Impaired  and intermittent n/t in feet up to shins    COORDINATION: WFL   EDEMA:  Mild edema in bil LE   MUSCLE TONE: WFL   POSTURE: rounded shoulders and forward head  LOWER EXTREMITY ROM:     Grossly WFL  LOWER EXTREMITY MMT:    MMT Right Eval Left Eval  Hip flexion 4 4  Hip extension    Hip abduction 4+ 4+  Hip adduction 4 4  Hip internal rotation    Hip external rotation    Knee flexion 4+ 4+  Knee extension 4+ 4+  Ankle dorsiflexion 4+ 4+  Ankle plantarflexion    Ankle inversion    Ankle eversion    (Blank rows = not tested)  BED MOBILITY:  Sit to supine Complete Independence Supine to sit Complete Independence  TRANSFERS: Assistive device utilized: None  Sit to stand: Complete Independence Stand to sit: Complete Independence Chair to chair: Complete Independence Floor: unable to complete .  Requires UE support topush from thighs to stand   RAMP:  Level of Assistance: Complete Independence Assistive device utilized:  Ramp Comments:   CURB:  Level of Assistance: Modified  independence Assistive device utilized: rail Curb Comments:   STAIRS: Level of Assistance: SBA Stair Negotiation Technique: Step to Pattern Alternating Pattern  with Single Rail on Right Number of Stairs: 4  Height of Stairs: 6  Comments: step to on descent  GAIT: Gait pattern: lateral hip instability, trunk flexed, and  wide BOS Distance walked: 915 Assistive device utilized: None Level of assistance: Complete Independence Comments: increased flexed posture with increased distance  FUNCTIONAL TESTS:  5 times sit to stand: 19.11 with UE support; 21.44 with UE support on knees.  Timed up and go (TUG): 15.64, 13.74 sec  6 minute walk test: 939ft  10 meter walk test: 12.43 and 11.68 Berg Balance Scale: TBD Dynamic Gait Index: TBD  PATIENT SURVEYS:  ABC scale 60                                                                                                                              TREATMENT DATE: DATE: 10/18/23  Unless otherwise stated, CGA was provided and gait belt donned in order to ensure pt safety throughout session.  Discussion of plan of care with pending end of certification on 7/15. Discussed progress toward goals and ongoing focus on HEP and balance. He verbalized understanding- stating he was okay with ending PT next week at end of his cert.    Self -Care/Home management:  -Scap retraction- 2 methods 12.5 # cable- 2 x 10  BTB 2 x 10  - Shoulder Ext- BTB 2 x 10  *VC    Standing hip ext BTB 2 x 10 reps (VC for erect posture)  Sit to stand with 5# db x 12 reps (VC to keep head looking straight ahead)  Dead lifts at wall 5#db x 15 reps (VC for technique and used wall as feedback for posture)      PATIENT EDUCATION: Education details: exercise technique, Pt educated throughout session about proper posture and technique with exercises. Improved exercise technique, movement at target joints, use of target muscles after min to mod verbal, visual, tactile  cues.  Person educated: Patient Education method: Archivist, Demo Education comprehension: verbalized understanding, returned demo  HOME EXERCISE PROGRAM:  Access Code: URL: [email or web address] Date: 06/23/2023 Prepared by: Connell Kiss  Exercises - Doorway Pec Stretch at 60 Elevation  - 1 x daily - 7 x weekly - 2 sets - 1 minute hold - Standing Shoulder Row with Anchored Resistance  - 1 x daily - 7 x weekly - 3 sets - 10 reps - Low Trap Setting at Wall  - 1 x daily - 7 x weekly - 2 sets - 10 reps - Standing Tandem Balance with Counter Support  - 3 x weekly - 2 sets - 30 sec hold - Standing Near Stance in Corner with Eyes Closed  - 1 x daily - 7 x weekly - 2 sets - 30 seconds hold - Standing Single Leg Stance with Counter Support  - 3 x weekly - 3-5 sets - as long as possible hold - Standing March with Counter Support  - 3 x weekly - 3 sets - 10 reps - Side Stepping with Counter Support  - 1 x daily - 3 x weekly - 3 sets - 3 reps - Backward Walking with Counter  Support  - 1 x daily - 7 x weekly - 3 sets - 5 reps   GOALS: Goals reviewed with patient? Yes  SHORT TERM GOALS: Target date: 09/13/2023    Patient will be independent in home exercise program to improve strength/mobility for better functional independence with ADLs. Baseline: 06/07/23: HEP provided  06/16/2023: pt reports consistent compliance with HEP 06/23/2023: updated HEP provided Goal status: IN PROGRESS   LONG TERM GOALS: Target date: 10/25/2023   Patient will increase ABC score to equal to or greater than 72  to demonstrate statistically significant improvement in mobility and quality of life.  Baseline: 60 06/16/2023: 55% 07/26/23: 70.6%  09/04/23: 73.75% Goal status: MET - previously MET  2.  Patient (> 72 years old) will complete five times sit to stand test in < 15 seconds indicating an increased LE strength and improved balance. Baseline: 21.44 06/16/2023: 18.38 seconds no UE  support 07/26/23: 15.55 seconds no UE support  09/01/23: 14.43 seconds no UE support Goal status: MET  3.  Patient will increase Berg Balance score by > 6 points to demonstrate decreased fall risk during functional activities Baseline: 1/30= 38/56 06/16/2023: 40/56 07/26/23: 42/56 09/01/23: 47/56 Goal status: MET  4.  Patient will increase 10 meter walk test to >1.56m/s as to improve gait speed for better community ambulation and to reduce fall risk. Baseline: 0.83 m/s 06/16/2023: 0.83 m/s (12 seconds) without AD 07/26/23: 0.96 m/s with no AD  09/01/23: 1.25 m/s with no AD (fast speed) Goal status: PREVIOUSLY MET  5.  Patient will reduce timed up and go to <11 seconds to reduce fall risk and demonstrate improved transfer/gait ability. Baseline: 14 sec  06/16/2023: 12.57 seconds no AD 07/26/23: 11.88 seconds no AD  09/01/23: 10.26 seconds no AD Goal status: MET  6.  Patient will increase dynamic gait index score to >19/24 as to demonstrate reduced fall risk and improved dynamic gait balance for better safety with community/home ambulation.  Baseline: 05/12/23: 17 06/16/2023: 17/24 07/26/23: 18/24 09/01/23: 21/24 Goal status: IN PROGRESS  7.  Patient will increase FGA score by > 4 points to demonstrate decreased fall risk during functional activities. Baseline: to perform at next visit; 09/06/2023= 23/30; 6/26: 25/30  Goal status: ONGOING   ASSESSMENT:  CLINICAL IMPRESSION:   Patient presented with good motivation for today's session. First part of visit discussed plan- including current end of cert date and discussed collectively- how he feels about his progress vs. Functional progress noted toward goals. Agreed to go with plan to discharge next week so then treatment focused on activities for posture and home management. He performed well overall- requiring cues for erect posture and correct/safe exercise technique. He continues to present with weaker left gluteals and will benefit from continued  education in Hip strengthening that he can perform at work/home. Patient able to progress his strengthening exercises with increased repetitions and/or resistance today. Pt will continue to benefit from skilled therapy to address remaining deficits in order to improve overall QoL and return to PLOF.      OBJECTIVE IMPAIRMENTS: Abnormal gait, cardiopulmonary status limiting activity, decreased activity tolerance, decreased balance, decreased coordination, decreased endurance, decreased knowledge of use of DME, decreased mobility, difficulty walking, decreased ROM, decreased strength, hypomobility, impaired perceived functional ability, impaired flexibility, impaired sensation, improper body mechanics, postural dysfunction, and obesity.   ACTIVITY LIMITATIONS: carrying, lifting, bending, standing, squatting, stairs, transfers, and locomotion level  PARTICIPATION LIMITATIONS: cleaning, laundry, community activity, and occupation  PERSONAL FACTORS: Age and 1-2  comorbidities: obesity CHF are also affecting patient's functional outcome.   REHAB POTENTIAL: Good  CLINICAL DECISION MAKING: Stable/uncomplicated  EVALUATION COMPLEXITY: Moderate  PLAN:  PT FREQUENCY: 1-2x/week  PT DURATION: 12 weeks  PLANNED INTERVENTIONS: 97110-Therapeutic exercises, 97530- Therapeutic activity, 97112- Neuromuscular re-education, 97535- Self Care, [id number]- Manual therapy, [id number]- Gait training, Patient/Family education, Balance training, Stair training, Taping, DME instructions, Cryotherapy, and Moist heat  PLAN FOR NEXT SESSION:  Dynamic balance interventions Fwd/back stepping over 1/2 foam roll vs hurdle with blaze pod dual-task  Tandem stance Dynamic gait training including: quick turning  stepping over obstacles agility ladder BLE strengthening L hip strengthening - glutes and hip abductors Continue with adding any HEP For LE strengthening and balance to prepare for upcoming discharge.   Chyrl London,  PT Physical Therapist - [facility]  1:31 PM 10/18/23

## 2023-10-20 ENCOUNTER — Ambulatory Visit: Admitting: Physical Therapy

## 2023-10-20 DIAGNOSIS — R262 Difficulty in walking, not elsewhere classified: Secondary | ICD-10-CM

## 2023-10-20 DIAGNOSIS — R2681 Unsteadiness on feet: Secondary | ICD-10-CM

## 2023-10-20 DIAGNOSIS — R2689 Other abnormalities of gait and mobility: Secondary | ICD-10-CM

## 2023-10-20 DIAGNOSIS — M6281 Muscle weakness (generalized): Secondary | ICD-10-CM

## 2023-10-20 NOTE — Therapy (Signed)
 OUTPATIENT PHYSICAL THERAPY NEURO TREATMENT   Patient Name: Anthony Weber MRN: [id number] DOB:June 06, 1945, 78 y.o., male Today's Date: 10/20/2023   PCP: Corlis Honor BROCKS, MD  REFERRING PROVIDER: Cherrie Toribio SAUNDERS, MD   END OF SESSION:   PT End of Session - 10/20/23 1052     Visit Number 44    Number of Visits 46    Date for PT Re-Evaluation 10/25/23    Progress Note Due on Visit 50    PT Start Time 1102    PT Stop Time 1142    PT Time Calculation (min) 40 min    Equipment Utilized During Treatment Gait belt    Activity Tolerance Patient tolerated treatment well    Behavior During Therapy WFL for tasks assessed/performed            Past Medical History:  Diagnosis Date   Depression    Hypertension    Hypothyroidism    Past Surgical History:  Procedure Laterality Date   COLONOSCOPY WITH PROPOFOL  N/A 03/03/2015   Procedure: COLONOSCOPY WITH PROPOFOL ;  Surgeon: Lamar ONEIDA Holmes, MD;  Location: [facility] ENDOSCOPY;  Service: Endoscopy;  Laterality: N/A;   EYE SURGERY     RIGHT HEART CATH N/A 08/08/2023   Procedure: RIGHT HEART CATH;  Surgeon: Cherrie Toribio SAUNDERS, MD;  Location: [facility] INVASIVE CV LAB;  Service: Cardiovascular;  Laterality: N/A;   RIGHT/LEFT HEART CATH AND CORONARY ANGIOGRAPHY N/A 01/27/2021   Procedure: RIGHT/LEFT HEART CATH AND CORONARY ANGIOGRAPHY;  Surgeon: Mady Bruckner, MD;  Location: [facility] INVASIVE CV LAB;  Service: Cardiovascular;  Laterality: N/A;   TEE WITHOUT CARDIOVERSION N/A 02/05/2021   Procedure: TRANSESOPHAGEAL ECHOCARDIOGRAM (TEE);  Surgeon: Cherrie Toribio SAUNDERS, MD;  Location: [facility] ENDOSCOPY;  Service: Cardiovascular;  Laterality: N/A;   Patient Active Problem List   Diagnosis Date Noted   Hyponatremia    Acute on chronic combined systolic and diastolic congestive heart failure (HCC)    Acute on chronic respiratory failure with hypoxia and hypercapnia (HCC)    MSSA bacteremia 02/03/2021   Central line infection    Pneumonia of both  lower lobes due to methicillin susceptible Staphylococcus aureus (MSSA) (HCC)    Cardiogenic shock (HCC) 01/29/2021   Primary hypertension    Acute HFrEF (heart failure with reduced ejection fraction) (HCC)    Acute CHF (congestive heart failure) (HCC) 01/18/2021    ONSET DATE: 3 years.   REFERRING DIAG:  Diagnosis  R26.89 (ICD-10-CM) - Imbalance    THERAPY DIAG:  Unsteadiness on feet  Muscle weakness (generalized)  Difficulty in walking, not elsewhere classified  Balance disorder  Imbalance  Rationale for Evaluation and Treatment: Rehabilitation  SUBJECTIVE:  SUBJECTIVE STATEMENT:    Pt reports he is doing well. He is still okay with discharge next week. Has been doing exercises at home and work.   Pt accompanied by: self  PERTINENT HISTORY:   From recent MD appointment. Admitted to [facility] 01/2021  acute HFrEF. Echo  EF 25-30% from previous EF 60-65%. R/LHC w/ no CAD, moderately elevated left heart and pulmonary artery pressures, severely elevated right heart filling pressures, severely reduced Fick cardiac output/index.  Required intropes, pressor, and lasix  drip.  Transferred to [facility] for cardiogenic/septic shock, + MSSA bacteremia. Underwent TEE showing EF 25-30%, RV moderately down, severe biatrial enlargement and moderate MR, no valvular vegetation. Required addition of mexitiline to suppress PVCs. Hospitalization c/b AKI and hypoxic/hypercarbic respiratory failure.   Discharged from [facility] 03/11/21 after he completed IV antibiotics and rehab.    At last visit midodrine  stopped and GDMT started.    Sleep study 4/23 AHI 2   Here for routine f/u. Here for f/u with his wife. Says he is doing pretty well. 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 Walking 1 mile at the [place] almost everyday. And  riding stationary bike for 30 mins every night.  PAIN:  Are you having pain? No  PRECAUTIONS: None  RED FLAGS: None   WEIGHT BEARING RESTRICTIONS: No  FALLS: Has patient fallen in last 6 months? No  LIVING ENVIRONMENT: Lives with: lives with their spouse Lives in: House/apartment Stairs: Yes: Internal: 12 steps; on right going up and on left going up and External: 1 steps; on right going up and on left going up Has following equipment at home: Single point cane and does not use often    PLOF: Independent, Independent with basic ADLs, Independent with gait, and Independent with transfers  PATIENT GOALS: improve balance and strength in legs.   OBJECTIVE:  Note: Objective measures were completed at Evaluation unless otherwise noted.  DIAGNOSTIC FINDINGS:  None relevant   COGNITION: Overall cognitive status: Within functional limits for tasks assessed   SENSATION: Light touch: Impaired  and intermittent n/t in feet up to shins    COORDINATION: WFL   EDEMA:  Mild edema in bil LE   MUSCLE TONE: WFL   POSTURE: rounded shoulders and forward head  LOWER EXTREMITY ROM:     Grossly WFL  LOWER EXTREMITY MMT:    MMT Right Eval Left Eval  Hip flexion 4 4  Hip extension    Hip abduction 4+ 4+  Hip adduction 4 4  Hip internal rotation    Hip external rotation    Knee flexion 4+ 4+  Knee extension 4+ 4+  Ankle dorsiflexion 4+ 4+  Ankle plantarflexion    Ankle inversion    Ankle eversion    (Blank rows = not tested)  BED MOBILITY:  Sit to supine Complete Independence Supine to sit Complete Independence  TRANSFERS: Assistive device utilized: None  Sit to stand: Complete Independence Stand to sit: Complete Independence Chair to chair: Complete Independence Floor: unable to complete .  Requires UE support topush from thighs to stand   RAMP:  Level of Assistance: Complete Independence Assistive device utilized:  Ramp Comments:   CURB:  Level of  Assistance: Modified independence Assistive device utilized: rail Curb Comments:   STAIRS: Level of Assistance: SBA Stair Negotiation Technique: Step to Pattern Alternating Pattern  with Single Rail on Right Number of Stairs: 4  Height of Stairs: 6  Comments: step to on descent  GAIT: Gait pattern: lateral hip instability,  trunk flexed, and wide BOS Distance walked: 915 Assistive device utilized: None Level of assistance: Complete Independence Comments: increased flexed posture with increased distance  FUNCTIONAL TESTS:  5 times sit to stand: 19.11 with UE support; 21.44 with UE support on knees.  Timed up and go (TUG): 15.64, 13.74 sec  6 minute walk test: 954ft  10 meter walk test: 12.43 and 11.68 Berg Balance Scale: TBD Dynamic Gait Index: TBD  PATIENT SURVEYS:  ABC scale 60                                                                                                                              TREATMENT DATE: DATE: 10/20/23  Unless otherwise stated, CGA was provided and gait belt donned in order to ensure pt safety throughout session.  TE- To improve strength, endurance, mobility, and function of specific targeted muscle groups or improve joint range of motion or improve muscle flexibility  STS 2 x 12 reps  Scap retraction- 2 methods BTB 2 x 15 reps BTB 2 x 15 reps  Standing hip ext BTB 2 x 10 reps (VC for erect posture) Sidestep ->squat-> return to start ( blue TB around ankles) 2 x 10 ( 5 ea side)   Seated hip ER/ hip ABD x 10 ea side with BTB around ankles, cues for forward trunk lean and posture     PATIENT EDUCATION: Education details: exercise technique, Pt educated throughout session about proper posture and technique with exercises. Improved exercise technique, movement at target joints, use of target muscles after min to mod verbal, visual, tactile cues.  Person educated: Patient Education method: Archivist, Demo Education comprehension:  verbalized understanding, returned demo  HOME EXERCISE PROGRAM:  Access Code: URL: [email or web address] Date: 06/23/2023 Prepared by: Connell Kiss  Exercises - Doorway Pec Stretch at 60 Elevation  - 1 x daily - 7 x weekly - 2 sets - 1 minute hold - Standing Shoulder Row with Anchored Resistance  - 1 x daily - 7 x weekly - 3 sets - 10 reps - Low Trap Setting at Wall  - 1 x daily - 7 x weekly - 2 sets - 10 reps - Standing Tandem Balance with Counter Support  - 3 x weekly - 2 sets - 30 sec hold - Standing Near Stance in Corner with Eyes Closed  - 1 x daily - 7 x weekly - 2 sets - 30 seconds hold - Standing Single Leg Stance with Counter Support  - 3 x weekly - 3-5 sets - as long as possible hold - Standing March with Counter Support  - 3 x weekly - 3 sets - 10 reps - Side Stepping with Counter Support  - 1 x daily - 3 x weekly - 3 sets - 3 reps - Backward Walking with Counter Support  - 1 x daily - 7 x weekly - 3 sets - 5 reps   GOALS: Goals reviewed with patient? Yes  SHORT TERM GOALS: Target date: 09/13/2023    Patient will be independent in home exercise program to improve strength/mobility for better functional independence with ADLs. Baseline: 06/07/23: HEP provided  06/16/2023: pt reports consistent compliance with HEP 06/23/2023: updated HEP provided Goal status: IN PROGRESS   LONG TERM GOALS: Target date: 10/25/2023   Patient will increase ABC score to equal to or greater than 72  to demonstrate statistically significant improvement in mobility and quality of life.  Baseline: 60 06/16/2023: 55% 07/26/23: 70.6%  09/04/23: 73.75% Goal status: MET - previously MET  2.  Patient (> 23 years old) will complete five times sit to stand test in < 15 seconds indicating an increased LE strength and improved balance. Baseline: 21.44 06/16/2023: 18.38 seconds no UE support 07/26/23: 15.55 seconds no UE support  09/01/23: 14.43 seconds no UE support Goal status:  MET  3.  Patient will increase Berg Balance score by > 6 points to demonstrate decreased fall risk during functional activities Baseline: 1/30= 38/56 06/16/2023: 40/56 07/26/23: 42/56 09/01/23: 47/56 Goal status: MET  4.  Patient will increase 10 meter walk test to >1.57m/s as to improve gait speed for better community ambulation and to reduce fall risk. Baseline: 0.83 m/s 06/16/2023: 0.83 m/s (12 seconds) without AD 07/26/23: 0.96 m/s with no AD  09/01/23: 1.25 m/s with no AD (fast speed) Goal status: PREVIOUSLY MET  5.  Patient will reduce timed up and go to <11 seconds to reduce fall risk and demonstrate improved transfer/gait ability. Baseline: 14 sec  06/16/2023: 12.57 seconds no AD 07/26/23: 11.88 seconds no AD  09/01/23: 10.26 seconds no AD Goal status: MET  6.  Patient will increase dynamic gait index score to >19/24 as to demonstrate reduced fall risk and improved dynamic gait balance for better safety with community/home ambulation.  Baseline: 05/12/23: 17 06/16/2023: 17/24 07/26/23: 18/24 09/01/23: 21/24 Goal status: IN PROGRESS  7.  Patient will increase FGA score by > 4 points to demonstrate decreased fall risk during functional activities. Baseline: to perform at next visit; 09/06/2023= 23/30; 6/26: 25/30  Goal status: ONGOING   ASSESSMENT:  CLINICAL IMPRESSION:    Patient presented with good motivation for today's session. Spent session completing exercises pt can reasonable perform at home. Pt does well with all exercises and activities and required min cues for proper performance and following cues pt performs exercises consistently well. Next week plan is to discharge with HEP in place at end of cert period. Pt will continue to benefit from skilled physical therapy intervention to address impairments, improve QOL, and attain therapy goals.      OBJECTIVE IMPAIRMENTS: Abnormal gait, cardiopulmonary status limiting activity, decreased activity tolerance, decreased balance,  decreased coordination, decreased endurance, decreased knowledge of use of DME, decreased mobility, difficulty walking, decreased ROM, decreased strength, hypomobility, impaired perceived functional ability, impaired flexibility, impaired sensation, improper body mechanics, postural dysfunction, and obesity.   ACTIVITY LIMITATIONS: carrying, lifting, bending, standing, squatting, stairs, transfers, and locomotion level  PARTICIPATION LIMITATIONS: cleaning, laundry, community activity, and occupation  PERSONAL FACTORS: Age and 1-2 comorbidities: obesity CHF are also affecting patient's functional outcome.   REHAB POTENTIAL: Good  CLINICAL DECISION MAKING: Stable/uncomplicated  EVALUATION COMPLEXITY: Moderate  PLAN:  PT FREQUENCY: 1-2x/week  PT DURATION: 12 weeks  PLANNED INTERVENTIONS: 97110-Therapeutic exercises, 97530- Therapeutic activity, [id number]- Neuromuscular re-education, 97535- Self Care, [id number]- Manual therapy, [phone]- Gait training, Patient/Family education, Balance training, Stair training, Taping, DME instructions, Cryotherapy, and Moist heat  PLAN FOR NEXT SESSION:  Dynamic balance  interventions Fwd/back stepping over 1/2 foam roll vs hurdle with blaze pod dual-task  Tandem stance Dynamic gait training including: quick turning  stepping over obstacles agility ladder BLE strengthening L hip strengthening - glutes and hip abductors Continue with adding any HEP For LE strengthening and balance to prepare for upcoming discharge.   Note: Portions of this document were prepared using Dragon voice recognition software and although reviewed may contain unintentional dictation errors in syntax, grammar, or spelling.  Lonni KATHEE Gainer PT ,DPT Physical Therapist- [place]  [facility]    10:53 AM 10/20/23

## 2023-10-25 ENCOUNTER — Ambulatory Visit

## 2023-10-25 DIAGNOSIS — R262 Difficulty in walking, not elsewhere classified: Secondary | ICD-10-CM

## 2023-10-25 DIAGNOSIS — R2681 Unsteadiness on feet: Secondary | ICD-10-CM

## 2023-10-25 DIAGNOSIS — R2689 Other abnormalities of gait and mobility: Secondary | ICD-10-CM

## 2023-10-25 DIAGNOSIS — M6281 Muscle weakness (generalized): Secondary | ICD-10-CM

## 2023-10-25 NOTE — Therapy (Signed)
 OUTPATIENT PHYSICAL THERAPY NEURO TREATMENT/DISCHARGE SUMMARY   Patient Name: Anthony Weber MRN: [id number] DOB:1946/02/12, 78 y.o., male Today's Date: 10/25/2023   PCP: Corlis Honor BROCKS, MD  REFERRING PROVIDER: Cherrie Toribio SAUNDERS, MD   END OF SESSION:   PT End of Session - 10/25/23 1150     Visit Number 45    Number of Visits 46    Date for PT Re-Evaluation 10/25/23    Progress Note Due on Visit 50    PT Start Time 1146    PT Stop Time 1216    PT Time Calculation (min) 30 min    Equipment Utilized During Treatment Gait belt    Activity Tolerance Patient tolerated treatment well    Behavior During Therapy WFL for tasks assessed/performed             Past Medical History:  Diagnosis Date   Depression    Hypertension    Hypothyroidism    Past Surgical History:  Procedure Laterality Date   COLONOSCOPY WITH PROPOFOL  N/A 03/03/2015   Procedure: COLONOSCOPY WITH PROPOFOL ;  Surgeon: Lamar ONEIDA Holmes, MD;  Location: [facility] ENDOSCOPY;  Service: Endoscopy;  Laterality: N/A;   EYE SURGERY     RIGHT HEART CATH N/A 08/08/2023   Procedure: RIGHT HEART CATH;  Surgeon: Cherrie Toribio SAUNDERS, MD;  Location: [facility] INVASIVE CV LAB;  Service: Cardiovascular;  Laterality: N/A;   RIGHT/LEFT HEART CATH AND CORONARY ANGIOGRAPHY N/A 01/27/2021   Procedure: RIGHT/LEFT HEART CATH AND CORONARY ANGIOGRAPHY;  Surgeon: Mady Bruckner, MD;  Location: [facility] INVASIVE CV LAB;  Service: Cardiovascular;  Laterality: N/A;   TEE WITHOUT CARDIOVERSION N/A 02/05/2021   Procedure: TRANSESOPHAGEAL ECHOCARDIOGRAM (TEE);  Surgeon: Cherrie Toribio SAUNDERS, MD;  Location: [facility] ENDOSCOPY;  Service: Cardiovascular;  Laterality: N/A;   Patient Active Problem List   Diagnosis Date Noted   Hyponatremia    Acute on chronic combined systolic and diastolic congestive heart failure (HCC)    Acute on chronic respiratory failure with hypoxia and hypercapnia (HCC)    MSSA bacteremia 02/03/2021   Central line infection     Pneumonia of both lower lobes due to methicillin susceptible Staphylococcus aureus (MSSA) (HCC)    Cardiogenic shock (HCC) 01/29/2021   Primary hypertension    Acute HFrEF (heart failure with reduced ejection fraction) (HCC)    Acute CHF (congestive heart failure) (HCC) 01/18/2021    ONSET DATE: 3 years.   REFERRING DIAG:  Diagnosis  R26.89 (ICD-10-CM) - Imbalance    THERAPY DIAG:  Unsteadiness on feet  Muscle weakness (generalized)  Difficulty in walking, not elsewhere classified  Balance disorder  Imbalance  Rationale for Evaluation and Treatment: Rehabilitation  SUBJECTIVE:  SUBJECTIVE STATEMENT:    Pt reports comfortable with discharge today. States he is doing some exercises at work and at home and realizes that he will need to continue to maintain the gains he has made in rehab.   Pt accompanied by: self  PERTINENT HISTORY:   From recent MD appointment. Admitted to [facility] 01/2021  acute HFrEF. Echo  EF 25-30% from previous EF 60-65%. R/LHC w/ no CAD, moderately elevated left heart and pulmonary artery pressures, severely elevated right heart filling pressures, severely reduced Fick cardiac output/index.  Required intropes, pressor, and lasix  drip.  Transferred to [facility] for cardiogenic/septic shock, + MSSA bacteremia. Underwent TEE showing EF 25-30%, RV moderately down, severe biatrial enlargement and moderate MR, no valvular vegetation. Required addition of mexitiline to suppress PVCs. Hospitalization c/b AKI and hypoxic/hypercarbic respiratory failure.   Discharged from [facility] 03/11/21 after he completed IV antibiotics and rehab.    At last visit midodrine  stopped and GDMT started.    Sleep study 4/23 AHI 2   Here for routine f/u. Here for f/u with his wife. Says he is doing pretty well.  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 Walking 1 mile at the [place] almost everyday. And riding stationary bike for 30 mins every night.  PAIN:  Are you having pain? No  PRECAUTIONS: None  RED FLAGS: None   WEIGHT BEARING RESTRICTIONS: No  FALLS: Has patient fallen in last 6 months? No  LIVING ENVIRONMENT: Lives with: lives with their spouse Lives in: House/apartment Stairs: Yes: Internal: 12 steps; on right going up and on left going up and External: 1 steps; on right going up and on left going up Has following equipment at home: Single point cane and does not use often    PLOF: Independent, Independent with basic ADLs, Independent with gait, and Independent with transfers  PATIENT GOALS: improve balance and strength in legs.   OBJECTIVE:  Note: Objective measures were completed at Evaluation unless otherwise noted.  DIAGNOSTIC FINDINGS:  None relevant   COGNITION: Overall cognitive status: Within functional limits for tasks assessed   SENSATION: Light touch: Impaired  and intermittent n/t in feet up to shins    COORDINATION: WFL   EDEMA:  Mild edema in bil LE   MUSCLE TONE: WFL   POSTURE: rounded shoulders and forward head  LOWER EXTREMITY ROM:     Grossly WFL  LOWER EXTREMITY MMT:    MMT Right Eval Left Eval  Hip flexion 4 4  Hip extension    Hip abduction 4+ 4+  Hip adduction 4 4  Hip internal rotation    Hip external rotation    Knee flexion 4+ 4+  Knee extension 4+ 4+  Ankle dorsiflexion 4+ 4+  Ankle plantarflexion    Ankle inversion    Ankle eversion    (Blank rows = not tested)  BED MOBILITY:  Sit to supine Complete Independence Supine to sit Complete Independence  TRANSFERS: Assistive device utilized: None  Sit to stand: Complete Independence Stand to sit: Complete Independence Chair to chair: Complete Independence Floor: unable to complete .  Requires UE support topush from thighs to stand   RAMP:  Level of Assistance:  Complete Independence Assistive device utilized:  Ramp Comments:   CURB:  Level of Assistance: Modified independence Assistive device utilized: rail Curb Comments:   STAIRS: Level of Assistance: SBA Stair Negotiation Technique: Step to Pattern Alternating Pattern  with Single Rail on Right Number of Stairs: 4  Height of Stairs: 6  Comments: step to on descent  GAIT: Gait pattern: lateral hip instability, trunk flexed, and wide BOS Distance walked: 915 Assistive device utilized: None Level of assistance: Complete Independence Comments: increased flexed posture with increased distance  FUNCTIONAL TESTS:  5 times sit to stand: 19.11 with UE support; 21.44 with UE support on knees.  Timed up and go (TUG): 15.64, 13.74 sec  6 minute walk test: 983ft  10 meter walk test: 12.43 and 11.68 Berg Balance Scale: TBD Dynamic Gait Index: TBD  PATIENT SURVEYS:  ABC scale 60                                                                                                                              TREATMENT DATE: DATE: 10/25/23  Unless otherwise stated, CGA was provided and gait belt donned in order to ensure pt safety throughout session.  Verbal review of HEP - including where and how much ankle weight for HEP- Reviwed seated and standing activities and patient with good understanding of HEP  Physical therapy treatment session today consisted of completing assessment of goals and administration of testing as demonstrated and documented in flow sheet, treatment, and goals section of this note. Addition treatments may be found below.    [facility] PT Assessment - 10/25/23 1154       Dynamic Gait Index   Level Surface Normal    Change in Gait Speed Normal    Gait with Horizontal Head Turns Normal    Gait with Vertical Head Turns Normal    Gait and Pivot Turn Normal    Step Over Obstacle Normal    Step Around Obstacles Normal    Steps Normal    Total Score 24      Functional Gait   Assessment   Gait assessed  Yes    Gait Level Surface Walks 20 ft in less than 5.5 sec, no assistive devices, good speed, no evidence for imbalance, normal gait pattern, deviates no more than 6 in outside of the 12 in walkway width.    Change in Gait Speed Able to smoothly change walking speed without loss of balance or gait deviation. Deviate no more than 6 in outside of the 12 in walkway width.    Gait with Horizontal Head Turns Performs head turns smoothly with no change in gait. Deviates no more than 6 in outside 12 in walkway width    Gait with Vertical Head Turns Performs head turns with no change in gait. Deviates no more than 6 in outside 12 in walkway width.    Gait and Pivot Turn Pivot turns safely within 3 sec and stops quickly with no loss of balance.    Step Over Obstacle Is able to step over 2 stacked shoe boxes taped together (9 in total height) without changing gait speed. No evidence of imbalance.    Gait with Narrow Base of Support Is able to ambulate for 10 steps heel to toe with no  staggering.    Gait with Eyes Closed Walks 20 ft, no assistive devices, good speed, no evidence of imbalance, normal gait pattern, deviates no more than 6 in outside 12 in walkway width. Ambulates 20 ft in less than 7 sec.    Ambulating Backwards Walks 20 ft, uses assistive device, slower speed, mild gait deviations, deviates 6-10 in outside 12 in walkway width.    Steps Alternating feet, must use rail.    Total Score 28            PATIENT EDUCATION: Education details: exercise technique, Pt educated throughout session about proper posture and technique with exercises. Improved exercise technique, movement at target joints, use of target muscles after min to mod verbal, visual, tactile cues.  Person educated: Patient Education method: Archivist, Demo Education comprehension: verbalized understanding, returned demo  HOME EXERCISE PROGRAM:  Access Code: URL:  [email or web address] Date: 06/23/2023 Prepared by: Connell Kiss  Exercises - Doorway Pec Stretch at 60 Elevation  - 1 x daily - 7 x weekly - 2 sets - 1 minute hold - Standing Shoulder Row with Anchored Resistance  - 1 x daily - 7 x weekly - 3 sets - 10 reps - Low Trap Setting at Wall  - 1 x daily - 7 x weekly - 2 sets - 10 reps - Standing Tandem Balance with Counter Support  - 3 x weekly - 2 sets - 30 sec hold - Standing Near Stance in Corner with Eyes Closed  - 1 x daily - 7 x weekly - 2 sets - 30 seconds hold - Standing Single Leg Stance with Counter Support  - 3 x weekly - 3-5 sets - as long as possible hold - Standing March with Counter Support  - 3 x weekly - 3 sets - 10 reps - Side Stepping with Counter Support  - 1 x daily - 3 x weekly - 3 sets - 3 reps - Backward Walking with Counter Support  - 1 x daily - 7 x weekly - 3 sets - 5 reps   GOALS: Goals reviewed with patient? Yes  SHORT TERM GOALS: Target date: 09/13/2023    Patient will be independent in home exercise program to improve strength/mobility for better functional independence with ADLs. Baseline: 06/07/23: HEP provided  06/16/2023: pt reports consistent compliance with HEP 06/23/2023: updated HEP provided; 10/25/2023- Patient verbalized good understanding of HEP Goal status: MET   LONG TERM GOALS: Target date: 10/25/2023   Patient will increase ABC score to equal to or greater than 72  to demonstrate statistically significant improvement in mobility and quality of life.  Baseline: 60 06/16/2023: 55% 07/26/23: 70.6%  09/04/23: 73.75% Goal status: MET - previously MET  2.  Patient (> 38 years old) will complete five times sit to stand test in < 15 seconds indicating an increased LE strength and improved balance. Baseline: 21.44 06/16/2023: 18.38 seconds no UE support 07/26/23: 15.55 seconds no UE support  09/01/23: 14.43 seconds no UE support Goal status: MET  3.  Patient will increase Berg Balance score  by > 6 points to demonstrate decreased fall risk during functional activities Baseline: 1/30= 38/56 06/16/2023: 40/56 07/26/23: 42/56 09/01/23: 47/56 Goal status: MET  4.  Patient will increase 10 meter walk test to >1.61m/s as to improve gait speed for better community ambulation and to reduce fall risk. Baseline: 0.83 m/s 06/16/2023: 0.83 m/s (12 seconds) without AD 07/26/23: 0.96 m/s with no AD  09/01/23: 1.25 m/s with no AD (fast  speed) Goal status: PREVIOUSLY MET  5.  Patient will reduce timed up and go to <11 seconds to reduce fall risk and demonstrate improved transfer/gait ability. Baseline: 14 sec  06/16/2023: 12.57 seconds no AD 07/26/23: 11.88 seconds no AD  09/01/23: 10.26 seconds no AD Goal status: MET  6.  Patient will increase dynamic gait index score to >19/24 as to demonstrate reduced fall risk and improved dynamic gait balance for better safety with community/home ambulation.  Baseline: 05/12/23: 17 06/16/2023: 17/24 07/26/23: 18/24 09/01/23: 21/24 10/25/23: 24/24 Goal status: MET  7.  Patient will increase FGA score by > 4 points to demonstrate decreased fall risk during functional activities. Baseline: to perform at next visit; 09/06/2023= 23/30; 6/26: 25/30; 10/25/2023= 28/30 Goal status: MET  ASSESSMENT:  CLINICAL IMPRESSION:    Patient presents with good motivation for last PT session. His balance was reassessed and both DGI and FGA balance test improved- meeting both goals. He denies any further falls and presents with much improved overall mobility since initiating PT services. He is appropriate for discharge to HEP today with goals met. He verbalized understanding and agreement with plan.      OBJECTIVE IMPAIRMENTS: Abnormal gait, cardiopulmonary status limiting activity, decreased activity tolerance, decreased balance, decreased coordination, decreased endurance, decreased knowledge of use of DME, decreased mobility, difficulty walking, decreased ROM, decreased  strength, hypomobility, impaired perceived functional ability, impaired flexibility, impaired sensation, improper body mechanics, postural dysfunction, and obesity.   ACTIVITY LIMITATIONS: carrying, lifting, bending, standing, squatting, stairs, transfers, and locomotion level  PARTICIPATION LIMITATIONS: cleaning, laundry, community activity, and occupation  PERSONAL FACTORS: Age and 1-2 comorbidities: obesity CHF are also affecting patient's functional outcome.   REHAB POTENTIAL: Good  CLINICAL DECISION MAKING: Stable/uncomplicated  EVALUATION COMPLEXITY: Moderate  PLAN:  PT FREQUENCY: 1-2x/week  PT DURATION: 12 weeks  PLANNED INTERVENTIONS: 97110-Therapeutic exercises, 97530- Therapeutic activity, [id number]- Neuromuscular re-education, 97535- Self Care, [id number]- Manual therapy, [phone]- Gait training, Patient/Family education, Balance training, Stair training, Taping, DME instructions, Cryotherapy, and Moist heat  PLAN FOR NEXT SESSION:   Discharge patient today.   Reyes LOISE London PT  Physical Therapist- [facility]    6:09 PM 10/25/23

## 2023-10-27 ENCOUNTER — Ambulatory Visit: Admitting: Physical Therapy

## 2023-11-01 ENCOUNTER — Ambulatory Visit

## 2023-11-02 ENCOUNTER — Other Ambulatory Visit: Payer: Self-pay

## 2023-11-02 ENCOUNTER — Other Ambulatory Visit (HOSPITAL_COMMUNITY): Payer: Self-pay | Admitting: Internal Medicine

## 2023-11-03 ENCOUNTER — Ambulatory Visit

## 2023-11-03 ENCOUNTER — Other Ambulatory Visit: Payer: Self-pay

## 2023-11-03 ENCOUNTER — Other Ambulatory Visit (HOSPITAL_COMMUNITY): Payer: Self-pay | Admitting: Internal Medicine

## 2023-11-04 ENCOUNTER — Other Ambulatory Visit: Payer: Self-pay

## 2023-11-04 MED FILL — Tirzepatide Soln Auto-injector 7.5 MG/0.5ML: SUBCUTANEOUS | 28 days supply | Qty: 2 | Fill #0 | Status: AC

## 2023-11-08 ENCOUNTER — Other Ambulatory Visit: Payer: Self-pay

## 2023-11-08 ENCOUNTER — Ambulatory Visit: Admitting: Physical Therapy

## 2023-11-09 ENCOUNTER — Other Ambulatory Visit (HOSPITAL_COMMUNITY): Payer: Self-pay

## 2023-11-09 ENCOUNTER — Telehealth: Payer: Self-pay

## 2023-11-09 NOTE — Progress Notes (Signed)
   11/09/2023  Patient ID: Anthony Weber, male   DOB: 1946/03/07, 78 y.o.   MRN: [id number]  This patient is appearing on a report for being at risk of failing the adherence measure for identified medications this calendar year.   Medication Adherence Summary (STAR/HEDIS Monitoring): Adherence Category: cholesterol (statin)    Drug Name: Pravastatin  20 mg at [facility]  Last [place] or Sold Date:10/11/2023 - shipped to patient Days' Supply: 30   Drug Name: Losartan  25 mg at [facility]  Last [place] or [place] Date:09/01/2023 Days' Supply: 100 - This medication is not on the report, but will follow preemptively  Drug Name: Jardiance  10 mg at [facility] Last Fill or Sold Date:11/06/2023 picked up at [facility]  Days' Supply: 60 - This medication is not on the report, but will follow preemptively  Drug Name: Mounjaro  7.5 mg at [facility] Last Fill or Sold Date:11/08/2023 - picked at [facility]  Days' Supply: 28 - This medication is not on the report, but will follow preemptively    Notes: ? Adherence data NOT pulled from pharmacy claims portal Dr. Annemarie. I called the above pharmacies. ? Patient was outreached today but unable to connect with patient. Called to clarify PCP as patient has been seen by a [facility] provider, but patient's spouse stated [name] was at work.  ? Reviewed barriers to adherence: none identified. ? Plan: MyChart message sent to patient. Will follow up for 30 to 90 or 100 days supply conversion to help improve compliance.   Anthony Weber, PharmD Clinical Pharmacist Cell: [phone]

## 2023-11-10 ENCOUNTER — Other Ambulatory Visit (HOSPITAL_COMMUNITY): Payer: Self-pay

## 2023-11-10 ENCOUNTER — Ambulatory Visit

## 2023-11-10 ENCOUNTER — Telehealth: Payer: Self-pay | Admitting: Pharmacist

## 2023-11-10 DIAGNOSIS — I5042 Chronic combined systolic (congestive) and diastolic (congestive) heart failure: Secondary | ICD-10-CM

## 2023-11-10 MED ORDER — PRAVASTATIN SODIUM 20 MG PO TABS
20.0000 mg | ORAL_TABLET | Freq: Every day | ORAL | 3 refills | Status: AC
Start: 2023-11-10 — End: ?
  Filled 2023-11-10 – 2024-02-17 (×3): qty 100, 100d supply, fill #0

## 2023-11-10 MED ORDER — EMPAGLIFLOZIN 10 MG PO TABS
10.0000 mg | ORAL_TABLET | Freq: Every day | ORAL | 3 refills | Status: AC
  Filled 2023-11-10 – 2023-12-04 (×2): qty 100, 100d supply, fill #0
  Filled 2024-04-08 – 2024-04-09 (×2): qty 100, 100d supply, fill #1

## 2023-11-10 NOTE — Telephone Encounter (Signed)
 Request for 100 day supplies granted for pravastatin  and Jardiance .

## 2023-11-14 ENCOUNTER — Other Ambulatory Visit (HOSPITAL_COMMUNITY): Payer: Self-pay

## 2023-11-14 ENCOUNTER — Other Ambulatory Visit (HOSPITAL_BASED_OUTPATIENT_CLINIC_OR_DEPARTMENT_OTHER): Payer: Self-pay

## 2023-11-15 ENCOUNTER — Encounter: Payer: Self-pay | Admitting: Family Medicine

## 2023-11-15 ENCOUNTER — Ambulatory Visit

## 2023-11-15 ENCOUNTER — Ambulatory Visit (INDEPENDENT_AMBULATORY_CARE_PROVIDER_SITE_OTHER): Admitting: Family Medicine

## 2023-11-15 VITALS — BP 99/61 | HR 56 | Temp 97.6°F | Ht 70.0 in | Wt 262.0 lb

## 2023-11-15 DIAGNOSIS — I5020 Unspecified systolic (congestive) heart failure: Secondary | ICD-10-CM | POA: Diagnosis not present

## 2023-11-15 DIAGNOSIS — Z7689 Persons encountering health services in other specified circumstances: Secondary | ICD-10-CM

## 2023-11-15 DIAGNOSIS — N182 Chronic kidney disease, stage 2 (mild): Secondary | ICD-10-CM

## 2023-11-15 DIAGNOSIS — Z136 Encounter for screening for cardiovascular disorders: Secondary | ICD-10-CM

## 2023-11-15 DIAGNOSIS — E039 Hypothyroidism, unspecified: Secondary | ICD-10-CM

## 2023-11-15 DIAGNOSIS — E871 Hypo-osmolality and hyponatremia: Secondary | ICD-10-CM | POA: Diagnosis not present

## 2023-11-15 DIAGNOSIS — I420 Dilated cardiomyopathy: Secondary | ICD-10-CM

## 2023-11-15 DIAGNOSIS — D649 Anemia, unspecified: Secondary | ICD-10-CM

## 2023-11-15 NOTE — Progress Notes (Signed)
 New patient visit   Patient: Anthony Weber   DOB: December 30, 1945   78 y.o. Male  MRN: [id number] Visit Date: 11/15/2023  Today's healthcare provider: LAURAINE LOISE BUOY, DO   Chief Complaint  Patient presents with   New Patient (Initial Visit)    Wants to establish care with a new PCP, no concerns.   Subjective    Anthony Weber is a 78 y.o. male who presents today as a new patient to establish care.  HPI HPI     New Patient (Initial Visit)    Additional comments: Wants to establish care with a new PCP, no concerns.      Last edited by Terrel Powell CROME, CMA on 11/15/2023  9:48 AM.      Anthony Weber is a 78 year old male with history of heart failure who presents to establish care after his previous doctor, Dr. Honor Cork, retired.  He has a history of heart failure with reduced ejection fraction, last measured at 25-30%. He underwent cardiac catheterization without stent placement. He experiences increased fatigue. He denies shortness of breath but feels tired after walking. He maintains regular exercise, including walking and using an elliptical machine at home, as well as stepping up and down on a step for practice.  He has undergone two sleep studies due to concerns about sleep apnea, both of which were negative. Despite this, he experiences daytime fatigue. He does not have sleep apnea and sleeps well at night, although he wakes up to urinate frequently.  He experiences balance issues and recently completed physical therapy, noting some improvement but still feeling unsteady at times. He holds onto his spouse for support when walking to prevent falls. No recent falls, but he feels weak and lacks balance.  He is currently taking several medications for his heart condition, including Mounjaro , Jardiance , torsemide , metoprolol , and levothyroxine . He takes torsemide  20 mg daily, with an increased dose of 40 mg on Mondays and Fridays. He also takes mexiletine  twice a day for arrhythmia, along with folic acid, vitamin D, B12, E, and a full dose aspirin .  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 He was in a coma for over a month and received antibiotics intravenously during his recovery.  He experiences occasional lightheadedness, particularly when standing up quickly, and has numbness and tingling in his hands for a long time. No chest pain or significant dizziness.  He follows a low sodium diet and has a history of high blood pressure, which is currently well-controlled with medication. He quit smoking approximately 50 years ago and does not consume alcohol regularly.      Past Medical History:  Diagnosis Date   Acute on chronic respiratory failure with hypoxia and hypercapnia (HCC)    Cardiogenic shock (HCC) 01/29/2021   Central line infection    CHF (congestive heart failure) (HCC)    Hypertension    Hypothyroidism    MSSA bacteremia 02/03/2021   Pneumonia of both lower lobes due to methicillin susceptible Staphylococcus aureus (MSSA) (HCC)    Past Surgical History:  Procedure Laterality Date   COLONOSCOPY WITH PROPOFOL  N/A 03/03/2015   Procedure: COLONOSCOPY WITH PROPOFOL ;  Surgeon: Lamar ONEIDA Holmes, MD;  Location[facility] ENDOSCOPY;  Service: Endoscopy;  Laterality: N/A;   EYE SURGERY     RIGHT HEART CATH N/A 08/08/2023   Procedure: RIGHT HEART  CATH;  Surgeon: Cherrie Toribio SAUNDERS, MD;  Location: [facility] INVASIVE CV LAB;  Service: Cardiovascular;  Laterality: N/A;   RIGHT/LEFT HEART CATH AND CORONARY ANGIOGRAPHY N/A 01/27/2021   Procedure: RIGHT/LEFT HEART CATH AND CORONARY ANGIOGRAPHY;  Surgeon: Mady Bruckner, MD;  Location: [facility] INVASIVE CV LAB;  Service: Cardiovascular;  Laterality: N/A;   TEE WITHOUT CARDIOVERSION N/A 02/05/2021   Procedure: TRANSESOPHAGEAL ECHOCARDIOGRAM (TEE);  Surgeon: Cherrie Toribio SAUNDERS, MD;  Location: [facility] ENDOSCOPY;  Service: Cardiovascular;  Laterality: N/A;   No family status information on file.   History reviewed. No pertinent family history. Social History   Socioeconomic History   Marital status: Married    Spouse name: Kimberly Coye   Number of children: Not on file   Years of education: Not on file   Highest education level: Not on file  Occupational History   Not on file  Tobacco Use   Smoking status: Former    Current packs/day: 0.00    Types: Cigarettes    Quit date: 74    Years since quitting: 50.6    Passive exposure: Past   Smokeless tobacco: Former    Types: Associate Professor status: Unknown  Substance and Sexual Activity   Alcohol use: No   Drug use: Never   Sexual activity: Never  Other Topics Concern   Not on file  Social History Narrative   Not on file   Social Drivers of Health   Financial Resource Strain: Not on file  Food Insecurity: Not on file  Transportation Needs: Not on file  Physical Activity: Not on file  Stress: Not on file  Social Connections: Not on file   Outpatient Medications Prior to Visit  Medication Sig   Ascorbic Acid (VITAMIN C) 1000 MG tablet Take 1,000 mg by mouth daily.   aspirin  325 MG tablet Take 325 mg by mouth daily.   Cholecalciferol (VITAMIN D3) 125 MCG (5000 UT) CAPS Take 1 capsule by mouth daily.   empagliflozin  (JARDIANCE ) 10 MG TABS tablet Take 1 tablet (10 mg total) by mouth daily before breakfast.   folic acid (FOLVITE) 400 MCG tablet Take 400 mcg by mouth daily.   levothyroxine  (SYNTHROID ) 75 MCG tablet Take 1 tablet (75 mcg total) by mouth every morning.   losartan  (COZAAR ) 25 MG tablet Take 0.5 tablets (12.5 mg total) by mouth daily.   metoprolol  succinate (TOPROL -XL) 25 MG 24 hr tablet Take 1 tablet (25 mg total) by mouth at bedtime with or immediately following a meal.   mexiletine (MEXITIL ) 150 MG capsule Take 1 capsule (150 mg total) by mouth 2 (two) times daily.    pravastatin  (PRAVACHOL ) 20 MG tablet Take 1 tablet (20 mg total) by mouth daily.   tirzepatide  (MOUNJARO ) 7.5 MG/0.5ML Pen Inject 7.5 mg into the skin once a week.   torsemide  (DEMADEX ) 20 MG tablet Take 2 tablets (40 mg total) by mouth daily.   vitamin B-12 (CYANOCOBALAMIN) 100 MCG tablet Take 100 mcg by mouth daily.   vitamin E 180 MG (400 UNITS) capsule Take 400 Units by mouth daily.   [DISCONTINUED] levothyroxine  (SYNTHROID ) 75 MCG tablet Take 75 mcg by mouth daily before breakfast.   [DISCONTINUED] mexiletine (MEXITIL ) 150 MG capsule Take 1 capsule (150 mg total) by mouth every 12 (twelve) hours.   No facility-administered medications prior to visit.   No Known Allergies  Immunization History  Administered Date(s) Administered   PFIZER Comirnaty(Gray Top)Covid-19 Tri-Sucrose Vaccine 05/17/2019, 06/06/2019    Health Maintenance  Topic Date Due   DTaP/Tdap/Td (1 - Tdap) Never done   Pneumococcal Vaccine: 50+ Years (1 of 2 - PCV) Never done   Zoster Vaccines- Shingrix (1 of 2) Never done   INFLUENZA VACCINE  11/11/2023   Medicare Annual Wellness (AWV)  12/07/2023   COVID-19 Vaccine (4 - 2024-25 season) 12/01/2023 (Originally 12/12/2022)   Hepatitis C Screening  Completed   Hepatitis B Vaccines  Aged Out   HPV VACCINES  Aged Out   Meningococcal B Vaccine  Aged Out   Colonoscopy  Discontinued    Patient Care Team: Elsye Mccollister, Lauraine SAILOR, DO as PCP - General (Family Medicine) Perla Evalene PARAS, MD as PCP - Cardiology (Cardiology)  Review of Systems  Constitutional:  Positive for fatigue.  Respiratory: Negative.  Negative for cough, shortness of breath and wheezing.   Cardiovascular:  Negative for chest pain, palpitations and leg swelling.  Neurological:  Positive for weakness (with prolonged standing), light-headedness (intermittent) and numbness (in hands). Negative for headaches.        Objective    BP 99/61 (BP Location: Right Arm, Patient Position: Sitting, Cuff Size: Large)    Pulse (!) 56   Temp 97.6 F (36.4 C) (Oral)   Ht 5' 10 (1.778 m)   Wt 262 lb (118.8 kg)   SpO2 97%   BMI 37.59 kg/m     Physical Exam Constitutional:      Appearance: Normal appearance.  HENT:     Head: Normocephalic and atraumatic.  Eyes:     General: No scleral icterus.    Extraocular Movements: Extraocular movements intact.     Conjunctiva/sclera: Conjunctivae normal.  Cardiovascular:     Rate and Rhythm: Normal rate and regular rhythm.     Pulses: Normal pulses.     Heart sounds: Normal heart sounds.  Pulmonary:     Effort: Pulmonary effort is normal. No respiratory distress.     Breath sounds: Normal breath sounds.  Musculoskeletal:     Right lower leg: No edema.     Left lower leg: No edema.  Skin:    General: Skin is warm and dry.  Neurological:     Mental Status: He is alert and oriented to person, place, and time. Mental status is at baseline.  Psychiatric:        Mood and Affect: Mood normal.        Behavior: Behavior normal.     Depression Screen    11/15/2023    9:54 AM  PHQ 2/9 Scores  PHQ - 2 Score 0  PHQ- 9 Score 2   No results found for any visits on 11/15/23.  Assessment & Plan     Acquired hypothyroidism -     TSH  Establishing care with new doctor, encounter for  Heart failure with reduced ejection fraction, NYHA class III (HCC) -     Lipid panel -     Comprehensive metabolic panel with GFR  Hyponatremia -     Comprehensive metabolic panel with GFR  Encounter for screening for cardiovascular disorders -     Lipid panel  Anemia, unspecified type -     CBC  Chronic kidney disease, stage 2, mildly decreased GFR  Nonischemic dilated cardiomyopathy [facility]) Assessment & Plan: 08/18/2022 - demonstrated on MR cardiac morphology w/wo contrast.       Establishing care with a new doctor, encounter for Annual wellness visit. Blood pressure low but acceptable for heart failure management. Occasional dizziness likely due to  medications.  -  Recommend influenza vaccine when available. - Administer pneumococcal vaccine. - Discuss shingles vaccine, noting it is a two-dose series and not a live vaccine. - Administer tetanus vaccine if not received in the last 10 years. - Encourage continuation of regular exercise. - Monitor blood pressure, especially if symptomatic.  Heart failure with reduced ejection fraction, NYHA class III; nonischemic dilated cardiomyopathy Chronic heart failure with low ejection fraction. On guideline-directed medical therapy. Reports fatigue, likely related to heart failure. Blood pressure management crucial to prevent dizziness. - Continue current heart failure medications including losartan , pravastatin , Jardiance , torsemide , and metoprolol . - Patient takes 20 mg torsemide  daily except Monday and Friday, on which he takes 40 mg. - Monitor weight. - Encourage adherence to low sodium diet. - Monitor for symptoms of dizziness or lightheadedness related to blood pressure. - Follows with cardiology; defer to specialist management.  Hypothyroidism Chronic hypothyroidism managed with levothyroxine . No symptoms reported. - Continue levothyroxine  75 mcg daily. - Monitor thyroid  function tests before next prescription refill.  Balance impairment Reports balance impairment with occasional dizziness. Completed physical therapy with some improvement.  - Monitor for any worsening of balance issues. - Consider using a walker for support to avoid falls  Hyponatremia Mild hyponatremia noted on previous blood work.  Will recheck metabolic panel today.  Anemia Mild anemia noted on previous blood work.  Will recheck levels today.  Chronic kidney disease stage 2 Continue to optimize risk factors.  Continue Jardiance . No acute concerns.  Continue to monitor.     Return in about 6 months (around 05/17/2024) for Chronic f/u; plus schedule for mAWV with AWV nurse when available.     I discussed the  assessment and treatment plan with the patient  The patient was provided an opportunity to ask questions and all were answered. The patient agreed with the plan and demonstrated an understanding of the instructions.   The patient was advised to call back or seek an in-person evaluation if the symptoms worsen or if the condition fails to improve as anticipated.    LAURAINE LOISE BUOY, DO  [facility] [phone] (phone) [phone] (fax)  [facility]

## 2023-11-15 NOTE — Assessment & Plan Note (Signed)
 08/18/2022 - demonstrated on MR cardiac morphology w/wo contrast.

## 2023-11-15 NOTE — Patient Instructions (Addendum)
 Recommended vaccines: - Prevnar-20 or -21 (pneumonia) - flu shot (when available) - consider Shingrix (shingles) (2-dose series) - Tdap (tetanus, diphtheria and pertussis (whooping cough)) - RSV (respiratory syncytial virus)

## 2023-11-16 LAB — COMPREHENSIVE METABOLIC PANEL WITH GFR
ALT: 11 IU/L (ref 0–44)
AST: 17 IU/L (ref 0–40)
Albumin: 4 g/dL (ref 3.8–4.8)
Alkaline Phosphatase: 56 IU/L (ref 44–121)
BUN/Creatinine Ratio: 23 (ref 10–24)
BUN: 24 mg/dL (ref 8–27)
Bilirubin Total: 0.5 mg/dL (ref 0.0–1.2)
CO2: 23 mmol/L (ref 20–29)
Calcium: 9.1 mg/dL (ref 8.6–10.2)
Chloride: 101 mmol/L (ref 96–106)
Creatinine, Ser: 1.04 mg/dL (ref 0.76–1.27)
Globulin, Total: 2.4 g/dL (ref 1.5–4.5)
Glucose: 88 mg/dL (ref 70–99)
Potassium: 5.4 mmol/L — ABNORMAL HIGH (ref 3.5–5.2)
Sodium: 140 mmol/L (ref 134–144)
Total Protein: 6.4 g/dL (ref 6.0–8.5)
eGFR: 73 mL/min/1.73 (ref >59)

## 2023-11-16 LAB — LIPID PANEL
Chol/HDL Ratio: 3.5 ratio (ref 0.0–5.0)
Cholesterol, Total: 136 mg/dL (ref 100–199)
HDL: 39 mg/dL — ABNORMAL LOW (ref >39)
LDL Chol Calc (NIH): 82 mg/dL (ref 0–99)
Triglycerides: 75 mg/dL (ref 0–149)
VLDL Cholesterol Cal: 15 mg/dL (ref 5–40)

## 2023-11-16 LAB — CBC
Hematocrit: 42.4 % (ref 37.5–51.0)
Hemoglobin: 13.7 g/dL (ref 13.0–17.7)
MCH: 30.9 pg (ref 26.6–33.0)
MCHC: 32.3 g/dL (ref 31.5–35.7)
MCV: 96 fL (ref 79–97)
Platelets: 227 x10E3/uL (ref 150–450)
RBC: 4.43 x10E6/uL (ref 4.14–5.80)
RDW: 12.5 % (ref 11.6–15.4)
WBC: 8.7 x10E3/uL (ref 3.4–10.8)

## 2023-11-16 LAB — TSH: TSH: 1.91 u[IU]/mL (ref 0.450–4.500)

## 2023-11-17 ENCOUNTER — Ambulatory Visit

## 2023-11-21 ENCOUNTER — Telehealth: Payer: Self-pay

## 2023-11-21 ENCOUNTER — Other Ambulatory Visit (HOSPITAL_COMMUNITY): Payer: Self-pay | Admitting: Pharmacist

## 2023-11-21 ENCOUNTER — Other Ambulatory Visit: Payer: Self-pay | Admitting: Internal Medicine

## 2023-11-21 ENCOUNTER — Other Ambulatory Visit (HOSPITAL_COMMUNITY): Payer: Self-pay

## 2023-11-21 MED ORDER — METOPROLOL SUCCINATE ER 25 MG PO TB24
25.0000 mg | ORAL_TABLET | Freq: Every day | ORAL | 0 refills | Status: DC
  Filled 2023-11-21 – 2023-11-28 (×2): qty 90, 90d supply, fill #0

## 2023-11-21 NOTE — Telephone Encounter (Signed)
 Callers name: Nabor Thomann Relation to patient: Wife   Issue/reason for call: wife called stating that the pt has rash on neck. States it started around the time he started mounjaro  for weight loss. She also states that he has not lost any weight. Denies any shortness of breath or swelling. She was wondering if he needed to continue mounjaro .   Got pt on schedule for fu. Any advice on the mounjaro ?

## 2023-11-21 NOTE — Telephone Encounter (Signed)
 Received message that patient's wife called the clinic to report rash on the patient's neck that they attribute to Mounjaro . Called patient's wife to discuss. She reports that the rash started as a small spot on his neck, on the right side, in early May. This was after the Mounjaro  was started and no other medication changes were made at that time. It was not very bothersome and did not itch, so they didn't check on it for a while, just continued to note its presence. Now the rash has spread as splotches on both sides of his neck and seems to get worse as he continues to take the Mounjaro . Only other medication change that has been made recently was Jardiance  was restarted on 10/07/23. However, this occurred 1-2 months after the rash was noted and he had previously tolerated Jardiance  in the past, so unlikely to be related to Jardiance . He has not spent a significant amount of time outside recently and has also made no changes to laundry detergent or body wash. His wife also notes that he has not been feeling good and believes he has gotten worse since Mounjaro  was initiated. He has not lost any weight, but he also has no appetite, and she has had to force him to eat sometimes. She does not feel like he is tolerating the medication well. No lip or facial swelling or difficulty breathing. Saw new PCP at [facility] on 11/15/23, but they did not discuss the rash because they forgot to show PCP. Last injection was 11/15/23, would be due for next injection tomorrow.  Based off the time course of the rash, it is quite possible Mounjaro  is causing the rash. No other medication changes appear to have been made when the rash started and no changes in detergent or body wash have been made either. Based off this and the concerns from his wife that he is not feeling well on the Mounjaro , I recommended they hold the Mounjaro  until see Dr. Cherrie on 12/08/23. At that point, can reassess if the rash has gotten  better or remained the same/worse after holding dose. Based off that information, could either resume Mounjaro  (would need to restart at 5 mg weekly since will have missed 3 doses, change to a different GLP1-RA or discontinue GLP1-RA therapy. Will update medication list today and expect further changes to be made at 12/08/23 visit with Dr. Bensimhon. His wife knows to contact PCP if rash gets worse or to go to the ED if notes lip/facial swelling and difficult breathing. She expressed understanding.   Tinnie Redman, PharmD, BCPS, BCCP, CPP Heart Failure Clinic Pharmacist [phone]

## 2023-11-22 ENCOUNTER — Other Ambulatory Visit: Payer: Self-pay

## 2023-11-22 ENCOUNTER — Ambulatory Visit: Payer: Self-pay | Admitting: Family Medicine

## 2023-11-22 DIAGNOSIS — E875 Hyperkalemia: Secondary | ICD-10-CM

## 2023-11-22 DIAGNOSIS — E039 Hypothyroidism, unspecified: Secondary | ICD-10-CM

## 2023-11-22 MED ORDER — LEVOTHYROXINE SODIUM 75 MCG PO TABS
75.0000 ug | ORAL_TABLET | Freq: Every morning | ORAL | 3 refills | Status: AC
  Filled 2023-11-22 – 2023-12-19 (×2): qty 90, 90d supply, fill #0
  Filled 2024-03-18: qty 90, 90d supply, fill #1

## 2023-11-28 ENCOUNTER — Other Ambulatory Visit (HOSPITAL_COMMUNITY): Payer: Self-pay

## 2023-11-28 ENCOUNTER — Other Ambulatory Visit: Payer: Self-pay

## 2023-12-05 ENCOUNTER — Other Ambulatory Visit: Payer: Self-pay

## 2023-12-05 ENCOUNTER — Other Ambulatory Visit (HOSPITAL_COMMUNITY): Payer: Self-pay

## 2023-12-07 ENCOUNTER — Telehealth: Payer: Self-pay | Admitting: Internal Medicine

## 2023-12-07 NOTE — Telephone Encounter (Signed)
 Called to confirm/remind patient of their appointment at the [facility] on 12/08/23.   Appointment:   [x] Confirmed  [] Left mess   [] No answer/No voice mail  [] VM Full/unable to leave message  [] Phone not in service  Patient reminded to bring all medications and/or complete list.  Confirmed patient has transportation. Gave directions, instructed to utilize valet parking.

## 2023-12-08 ENCOUNTER — Ambulatory Visit (HOSPITAL_BASED_OUTPATIENT_CLINIC_OR_DEPARTMENT_OTHER): Admitting: Internal Medicine

## 2023-12-08 ENCOUNTER — Other Ambulatory Visit: Payer: Self-pay

## 2023-12-08 ENCOUNTER — Other Ambulatory Visit
Admission: RE | Admit: 2023-12-08 | Discharge: 2023-12-08 | Disposition: A | Discharge status: Home / Self Care | Source: Ambulatory Visit | Attending: Internal Medicine | Admitting: Internal Medicine

## 2023-12-08 DIAGNOSIS — I428 Other cardiomyopathies: Secondary | ICD-10-CM | POA: Diagnosis not present

## 2023-12-08 DIAGNOSIS — Z6838 Body mass index (BMI) 38.0-38.9, adult: Secondary | ICD-10-CM | POA: Insufficient documentation

## 2023-12-08 DIAGNOSIS — R2689 Other abnormalities of gait and mobility: Secondary | ICD-10-CM | POA: Insufficient documentation

## 2023-12-08 DIAGNOSIS — Z7985 Long-term (current) use of injectable non-insulin antidiabetic drugs: Secondary | ICD-10-CM | POA: Insufficient documentation

## 2023-12-08 DIAGNOSIS — Z7984 Long term (current) use of oral hypoglycemic drugs: Secondary | ICD-10-CM | POA: Diagnosis not present

## 2023-12-08 DIAGNOSIS — I110 Hypertensive heart disease with heart failure: Secondary | ICD-10-CM | POA: Insufficient documentation

## 2023-12-08 DIAGNOSIS — I493 Ventricular premature depolarization: Secondary | ICD-10-CM | POA: Diagnosis not present

## 2023-12-08 DIAGNOSIS — I5082 Biventricular heart failure: Secondary | ICD-10-CM | POA: Diagnosis not present

## 2023-12-08 DIAGNOSIS — Z79899 Other long term (current) drug therapy: Secondary | ICD-10-CM | POA: Insufficient documentation

## 2023-12-08 DIAGNOSIS — I5043 Acute on chronic combined systolic (congestive) and diastolic (congestive) heart failure: Secondary | ICD-10-CM

## 2023-12-08 DIAGNOSIS — I5042 Chronic combined systolic (congestive) and diastolic (congestive) heart failure: Secondary | ICD-10-CM | POA: Diagnosis not present

## 2023-12-08 DIAGNOSIS — I5022 Chronic systolic (congestive) heart failure: Secondary | ICD-10-CM | POA: Diagnosis not present

## 2023-12-08 DIAGNOSIS — Z87891 Personal history of nicotine dependence: Secondary | ICD-10-CM | POA: Diagnosis not present

## 2023-12-08 DIAGNOSIS — I447 Left bundle-branch block, unspecified: Secondary | ICD-10-CM

## 2023-12-08 DIAGNOSIS — E875 Hyperkalemia: Secondary | ICD-10-CM | POA: Diagnosis not present

## 2023-12-08 LAB — BASIC METABOLIC PANEL WITH GFR
Anion gap: 8 (ref 5–15)
BUN: 20 mg/dL (ref 8–23)
CO2: 28 mmol/L (ref 22–32)
Calcium: 8.6 mg/dL — ABNORMAL LOW (ref 8.9–10.3)
Chloride: 103 mmol/L (ref 98–111)
Creatinine, Ser: 0.9 mg/dL (ref 0.61–1.24)
GFR, Estimated: >60 mL/min (ref >60)
Glucose, Bld: 103 mg/dL — ABNORMAL HIGH (ref 70–99)
Potassium: 4.3 mmol/L (ref 3.5–5.1)
Sodium: 139 mmol/L (ref 135–145)

## 2023-12-08 LAB — BRAIN NATRIURETIC PEPTIDE: B Natriuretic Peptide: 521.3 pg/mL — ABNORMAL HIGH (ref 0.0–100.0)

## 2023-12-08 MED ORDER — LOSARTAN POTASSIUM 25 MG PO TABS
12.5000 mg | ORAL_TABLET | Freq: Every day | ORAL | 3 refills | Status: AC
  Filled 2023-12-08: qty 90, 180d supply, fill #0
  Filled 2024-03-11: qty 45, 90d supply, fill #0

## 2023-12-08 NOTE — Progress Notes (Signed)
 ReDS Vest / Clip - 12/08/23 1000       ReDS Vest / Clip   Station Marker D    Ruler Value 36    ReDS Value Range Low volume    ReDS Actual Value 28

## 2023-12-08 NOTE — Patient Instructions (Signed)
 Medication Changes:  STOP Metoprolol   MOVE Losartan  to 12.5mg  (1/2 tab) at bedtime  Lab Work:  Go over to the [facility]. Go pass the gift shop and have your blood work completed.  We will only call you if the results are abnormal or if the provider would like to make medication changes.  No news is good news.   Follow-Up in: Please follow up with the [facility] in 4 months with Dr. Cherrie. We do not currently have that schedule. Please give us  a call in November in order to schedule your appointment for December.    Thank you for choosing [facility].    At the [facility], you and your health needs are our priority. We have a designated team specialized in the treatment of Heart Failure. This Care Team includes your primary Heart Failure Specialized Cardiologist (physician), Advanced Practice Providers (APPs- Physician Assistants and Nurse Practitioners), and Pharmacist who all work together to provide you with the care you need, when you need it.   You may see any of the following providers on your designated Care Team at your next follow up:  Dr. Toribio Cherrie Dr. Ezra Shuck Dr. Ria Commander Dr. Morene Brownie Ellouise Class, FNP Jaun Bash, RPH-CPP  Please be sure to bring in all your medications bottles to every appointment.   Need to Contact Us :  If you have any questions or concerns before your next appointment please send us  a message through [place] or call our office at [phone].    TO LEAVE A MESSAGE FOR THE NURSE SELECT OPTION 2, PLEASE LEAVE A MESSAGE INCLUDING: YOUR NAME DATE OF BIRTH CALL BACK NUMBER REASON FOR CALL**this is important as we prioritize the call backs  YOU WILL RECEIVE A CALL BACK THE SAME DAY AS LONG AS YOU CALL BEFORE 4:00 PM

## 2023-12-08 NOTE — Progress Notes (Signed)
 [facility]  NOTE  Primary Care: Anthony Lauraine SAILOR, DO Primary HF Cardiologist: Dr. Cherrie  HPI: [name] is a 78 y.o. with history includes combined systolic/diastolic HF, HTN, HLD , PVCs, and obesity. Had Echo 04/18/20  LVEF 60-65% w/ G2DD and normal RV.   Admitted to [facility] 10/22  acute HFrEF. Echo  EF 25-30% from previous EF 60-65%. R/LHC w/ no CAD, moderately elevated left heart and pulmonary artery pressures, severely elevated right heart filling pressures, severely reduced Fick cardiac output/index.  Required intropes, pressor, and lasix  drip.  Transferred to [facility] for cardiogenic/septic shock, + MSSA bacteremia. Underwent TEE showing EF 25-30%, RV moderately down, severe biatrial enlargement and moderate MR, no valvular vegetation. Required addition of mexitiline to suppress PVCs. Hospitalization c/b AKI and hypoxic/hypercarbic respiratory failure.  Discharged from [facility] 03/11/21 after he completed IV antibiotics and rehab.   At last visit midodrine  stopped and GDMT started.   Sleep study 4/23 AHI 2  Underwent RHC in 4/25 for progressive HF symptoms: RA 7. PA 45/12 (25) PCW 11 Fick 6.3/2.7 TD 5.4/2.4  Here for routine f/u. Here for f/u with his wife. 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 Riding the stationary bike 30-35 mins/day. Goes to [place] 1-2x week to walk. Feels like it is getting a bit harder. Has finished PT/OT. Balance a little better. BP has been low. No edema, orthpnea or PND. SBP 90-100 at home   Echo 07/19/23 EF 25-30% RV ok Personally reviewed  cMRI 5/24 EF 31% no LGE or infiltrative process. RVEF 43% Zio 4/24 SR - average rate 66. Nine runs NSVT  3.1% PVCs Echo  9/23 EF 35-40% moderate MR.  Echo 07/27/22 EF 30-35% moderate MR  Cardiac Studies Echo 2/23 EF 30%  RV normal.  Echo 04/2020 EF 60-65%.   LHC/RHC 01/2021  no CAD  RA 20 PA 48/29 (35)  PCWP 25 CO 3.7 CI 1.4  Past Medical History:  Diagnosis Date   Acute on chronic  respiratory failure with hypoxia and hypercapnia (HCC)    Cardiogenic shock (HCC) 01/29/2021   Central line infection    CHF (congestive heart failure) (HCC)    Hypertension    Hypothyroidism    MSSA bacteremia 02/03/2021   Pneumonia of both lower lobes due to methicillin susceptible Staphylococcus aureus (MSSA) (HCC)    Current Outpatient Medications  Medication Sig Dispense Refill   Ascorbic Acid (VITAMIN C) 1000 MG tablet Take 1,000 mg by mouth daily.     aspirin  325 MG tablet Take 325 mg by mouth daily.     Cholecalciferol (VITAMIN D3) 125 MCG (5000 UT) CAPS Take 1 capsule by mouth daily.     empagliflozin  (JARDIANCE ) 10 MG TABS tablet Take 1 tablet (10 mg total) by mouth daily before breakfast. 100 tablet 3   folic acid (FOLVITE) 400 MCG tablet Take 400 mcg by mouth daily.     levothyroxine  (SYNTHROID ) 75 MCG tablet Take 1 tablet (75 mcg total) by mouth every morning. 90 tablet 3   losartan  (COZAAR ) 25 MG tablet Take 0.5 tablets (12.5 mg total) by mouth daily. 90 tablet 3   metoprolol  succinate (TOPROL -XL) 25 MG 24 hr tablet Take 1 tablet (25 mg total) by mouth at bedtime with or immediately following a meal. 90 tablet 0   mexiletine (MEXITIL ) 150 MG capsule Take 1 capsule (150 mg total) by mouth 2 (two) times daily. 180 capsule 0   pravastatin  (PRAVACHOL ) 20 MG tablet  Take 1 tablet (20 mg total) by mouth daily. 100 tablet 3   torsemide  (DEMADEX ) 20 MG tablet Take 2 tablets (40 mg total) by mouth daily. 180 tablet 3   vitamin B-12 (CYANOCOBALAMIN) 100 MCG tablet Take 100 mcg by mouth daily.     vitamin E 180 MG (400 UNITS) capsule Take 400 Units by mouth daily.     No current facility-administered medications for this visit.   No Known Allergies  Social History   Socioeconomic History   Marital status: Married    Spouse name: Anthony Weber   Number of children: Not on file   Years of education: Not on file   Highest education level: Not on file  Occupational History   Not  on file  Tobacco Use   Smoking status: Former    Current packs/day: 0.00    Types: Cigarettes    Quit date: 52    Years since quitting: 50.6    Passive exposure: Past   Smokeless tobacco: Former    Types: Associate Professor status: Unknown  Substance and Sexual Activity   Alcohol use: No   Drug use: Never   Sexual activity: Never  Other Topics Concern   Not on file  Social History Narrative   Not on file   Social Drivers of Health   Financial Resource Strain: Not on file  Food Insecurity: Not on file  Transportation Needs: Not on file  Physical Activity: Not on file  Stress: Not on file  Social Connections: Not on file  Intimate Partner Violence: Not on file   No family history on file. Vitals:   12/08/23 0958  BP: 103/68  Pulse: (!) 55  SpO2: 99%      BP 103/68   Pulse (!) 55   Wt 265 lb (120.2 kg)   SpO2 99%   BMI 38.02 kg/m   Wt Readings from Last 3 Encounters:  12/08/23 265 lb (120.2 kg)  11/15/23 262 lb (118.8 kg)  08/16/23 269 lb 9.6 oz (122.3 kg)   PHYSICAL EXAM: General:  Elderly obese. No resp difficulty HEENT: normal Neck: supple. no JVD. Carotids 2+ bilat; no bruits. No lymphadenopathy or thryomegaly appreciated. Cor: PMI nondisplaced. Regular rate & rhythm. No rubs, gallops or murmurs. Lungs: clear Abdomen: obese soft, nontender, nondistended. Good bowel sounds. Extremities: no cyanosis, clubbing, rash, tr edema Neuro: alert & orientedx3, cranial nerves grossly intact. moves all 4 extremities w/o difficulty. Affect pleasant   ASSESSMENT & PLAN:   1. Chronic Systolic Heart Failure  - Echo (10/22): EF down from 55-->20-25%.  - R/LHC (10/22): with no CAD, elevated filling pressures and low output heart failure.  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 Sleep study 3/23 negative (AHI 2) - Echo 05/14/21 EF 30% RV normal  - Echo 12/17/21 EF 35-40% moderate MR.  - Echo 07/27/22:  EF 30-35% moderate MR - cMRI 5/24 EF 31% no LGE or infiltrative process. RVEF 43% - EF essentially unchanged despite PVC suppression. cMRI confirms NICM. Doubt LBBB wide enough to cause CM.  - Zio 4/24 SR - average rate 66. Nine runs NSVT  3.1% PVCs - Underwent RHC in 4/25 for progressive HF symptoms: RA 7. PA 45/12 (25) PCW 11 Fick 6.3/2.7 TD 5.4/2.4 - Still with progressive NYHA III-IIIb bur RHC is reassuring from cardiac status - Echo 07/19/23 EF 25-30% RV ok Personally reviewed -  Volume status ok. Continue torsemide  20  daily with 40mg  MF - Continue jardiance  10 mg daily - Off spironolactone  12.5 mg daily due to hyperkalemia - Change losartan  12.5 mg qhs - Stop Toprol  with low BP and fatigue - Remains uninterested in ICD - Labs today to recheck potassium (was 5.4 on 11/15/23)   2. NSVT, PVC  - Remains in NSR.  PVCs suppressed - Zio 4/24 SR - average rate 66. Nine runs NSVT  3.1% PVCs - Continue mexilitene 150 bid - Sleep study negative 3/23  3. Morbid Obesity  - Body mass index is 38.02 kg/m. - Continue weight loss efforts. - Unable to tolerate Mounjaro  due to rash and fatigue  4. LBBB - plan as above - QRS 136 -  doubt this causing CM  5. Poor balance - Had PT/OT eval    Toribio Fuel MD 10:27 AM

## 2023-12-08 NOTE — Addendum Note (Signed)
 Addended by: SHARL GRATE A on: 12/08/2023 10:40 AM   Modules accepted: Orders

## 2023-12-08 NOTE — Addendum Note (Signed)
 Addended by: SHARL GRATE A on: 12/08/2023 10:45 AM   Modules accepted: Orders

## 2023-12-19 ENCOUNTER — Other Ambulatory Visit: Payer: Self-pay

## 2023-12-19 ENCOUNTER — Other Ambulatory Visit (HOSPITAL_COMMUNITY): Payer: Self-pay

## 2023-12-22 ENCOUNTER — Other Ambulatory Visit (HOSPITAL_COMMUNITY): Payer: Self-pay

## 2023-12-30 ENCOUNTER — Other Ambulatory Visit (HOSPITAL_COMMUNITY): Payer: Self-pay

## 2024-01-02 ENCOUNTER — Other Ambulatory Visit (HOSPITAL_COMMUNITY): Payer: Self-pay

## 2024-01-05 ENCOUNTER — Other Ambulatory Visit (HOSPITAL_COMMUNITY): Payer: Self-pay

## 2024-01-09 ENCOUNTER — Other Ambulatory Visit (HOSPITAL_COMMUNITY): Payer: Self-pay

## 2024-01-09 ENCOUNTER — Other Ambulatory Visit: Payer: Self-pay | Admitting: Family Medicine

## 2024-01-09 NOTE — Telephone Encounter (Signed)
 Pharmacy called reporting that the patient requested this on 12/30/2023 however it was sent to Dr. Corlis instead of PCP. Please advise  Copied from CRM #[id number]. Topic: Clinical - Medication Refill >> Jan 09, 2024  8:56 AM Rosaria E wrote: Medication:  mexiletine (MEXITIL ) 150 MG capsule   Has the patient contacted their pharmacy? No (Agent: If no, request that the patient contact the pharmacy for the refill. If patient does not wish to contact the pharmacy document the reason why and proceed with request.) (Agent: If yes, when and what did the pharmacy advise?)  This is the patient's preferred pharmacy:   [facility] [address] [place] [address] Phone: [phone] Fax: [phone]  Is this the correct pharmacy for this prescription? Yes If no, delete pharmacy and type the correct one.   Has the prescription been filled recently? Yes  Is the patient out of the medication? No.   Has the patient been seen for an appointment in the last year OR does the patient have an upcoming appointment? Yes  Can we respond through MyChart? Yes  Agent: Please be advised that Rx refills may take up to 3 business days. We ask that you follow-up with your pharmacy.

## 2024-01-10 ENCOUNTER — Other Ambulatory Visit (HOSPITAL_COMMUNITY): Payer: Self-pay

## 2024-01-10 NOTE — Telephone Encounter (Signed)
 Requested medications are due for refill today.  yes  Requested medications are on the active medications list.  yes  Last refill. 10/03/2023 #180 0 rf  Future visit scheduled.   yes  Notes to clinic.  Refill not delegated.    Requested Prescriptions  Pending Prescriptions Disp Refills   mexiletine (MEXITIL ) 150 MG capsule 180 capsule 0    Sig: Take 1 capsule (150 mg total) by mouth 2 (two) times daily.     Not Delegated - Cardiovascular:  Antiarrhythmic Agents Failed - 01/10/2024  5:16 PM      Failed - This refill cannot be delegated      Failed - Mg Level in normal range and within 180 days    Magnesium   Date Value Ref Range Status  02/06/2021 2.3 1.7 - 2.4 mg/dL Final    Comment:    Performed at [facility], [address], [place], [place] [address]         Failed - Ca in normal range and within 180 days    Calcium  Date Value Ref Range Status  12/08/2023 8.6 (L) 8.9 - 10.3 mg/dL Final   Calcium, Total  Date Value Ref Range Status  06/05/2011 8.0 (L) 8.5 - 10.1 mg/dL Final   Calcium, Ion  Date Value Ref Range Status  08/08/2023 1.19 1.15 - 1.40 mmol/L Final  08/08/2023 1.18 1.15 - 1.40 mmol/L Final         Passed - K in normal range and within 180 days    Potassium  Date Value Ref Range Status  12/08/2023 4.3 3.5 - 5.1 mmol/L Final  06/05/2011 4.0 3.5 - 5.1 mmol/L Final         Passed - AST in normal range and within 180 days    AST  Date Value Ref Range Status  11/15/2023 17 0 - 40 IU/L Final   SGOT(AST)  Date Value Ref Range Status  06/04/2011 43 (H) 15 - 37 Unit/L Final         Passed - ALT in normal range and within 180 days    ALT  Date Value Ref Range Status  11/15/2023 11 0 - 44 IU/L Final   SGPT (ALT)  Date Value Ref Range Status  06/04/2011 23 U/L Final    Comment:    12-78 NOTE: NEW REFERENCE RANGE 03/05/2011          Passed - Patient had ECG in the last 180 days      Passed - Last BP in normal range    BP Readings from  Last 1 Encounters:  12/08/23 103/68         Passed - Last Heart Rate in normal range    Pulse Readings from Last 1 Encounters:  12/08/23 (!) 55         Passed - Valid encounter within last 6 months    Recent Outpatient Visits           1 month ago Acquired hypothyroidism   [facility], Lauraine SAILOR, DO

## 2024-01-11 ENCOUNTER — Other Ambulatory Visit (HOSPITAL_COMMUNITY): Payer: Self-pay

## 2024-01-11 NOTE — Telephone Encounter (Signed)
 Wants it sent to  [facility]  [address] [place] [address]  Phone: [phone] Fax: [phone]  Hours: Mon-Fri 7:30a-7p; Sat 8a-4:30p; [place] 10a-2p

## 2024-01-11 NOTE — Telephone Encounter (Signed)
 Pt's wife called back for update on request, pt is completely out of his current supply.

## 2024-01-13 ENCOUNTER — Other Ambulatory Visit: Payer: Self-pay

## 2024-01-13 MED ORDER — MEXILETINE HCL 150 MG PO CAPS
150.0000 mg | ORAL_CAPSULE | Freq: Two times a day (BID) | ORAL | 3 refills | Status: AC
  Filled 2024-01-13: qty 60, 30d supply, fill #0
  Filled 2024-01-23: qty 180, 90d supply, fill #1
  Filled 2024-02-06: qty 60, 30d supply, fill #1
  Filled 2024-03-09 (×2): qty 60, 30d supply, fill #2
  Filled 2024-04-08: qty 60, 30d supply, fill #3
  Filled 2024-04-09: qty 60, 30d supply, fill #0
  Filled 2024-05-07: qty 60, 30d supply, fill #1

## 2024-01-23 ENCOUNTER — Other Ambulatory Visit (HOSPITAL_COMMUNITY): Payer: Self-pay

## 2024-01-23 ENCOUNTER — Other Ambulatory Visit: Payer: Self-pay

## 2024-01-24 ENCOUNTER — Ambulatory Visit (INDEPENDENT_AMBULATORY_CARE_PROVIDER_SITE_OTHER)

## 2024-01-24 DIAGNOSIS — Z23 Encounter for immunization: Secondary | ICD-10-CM | POA: Diagnosis not present

## 2024-01-30 DIAGNOSIS — M9904 Segmental and somatic dysfunction of sacral region: Secondary | ICD-10-CM | POA: Diagnosis not present

## 2024-01-30 DIAGNOSIS — M5451 Vertebrogenic low back pain: Secondary | ICD-10-CM | POA: Diagnosis not present

## 2024-01-30 DIAGNOSIS — M25552 Pain in left hip: Secondary | ICD-10-CM | POA: Diagnosis not present

## 2024-01-30 DIAGNOSIS — M9903 Segmental and somatic dysfunction of lumbar region: Secondary | ICD-10-CM | POA: Diagnosis not present

## 2024-02-03 ENCOUNTER — Other Ambulatory Visit: Payer: Self-pay

## 2024-02-06 ENCOUNTER — Other Ambulatory Visit: Payer: Self-pay

## 2024-02-14 DIAGNOSIS — Z08 Encounter for follow-up examination after completed treatment for malignant neoplasm: Secondary | ICD-10-CM | POA: Diagnosis not present

## 2024-02-14 DIAGNOSIS — D2261 Melanocytic nevi of right upper limb, including shoulder: Secondary | ICD-10-CM | POA: Diagnosis not present

## 2024-02-14 DIAGNOSIS — L821 Other seborrheic keratosis: Secondary | ICD-10-CM | POA: Diagnosis not present

## 2024-02-14 DIAGNOSIS — D2262 Melanocytic nevi of left upper limb, including shoulder: Secondary | ICD-10-CM | POA: Diagnosis not present

## 2024-02-14 DIAGNOSIS — C44622 Squamous cell carcinoma of skin of right upper limb, including shoulder: Secondary | ICD-10-CM | POA: Diagnosis not present

## 2024-02-14 DIAGNOSIS — Z85828 Personal history of other malignant neoplasm of skin: Secondary | ICD-10-CM | POA: Diagnosis not present

## 2024-02-14 DIAGNOSIS — D0461 Carcinoma in situ of skin of right upper limb, including shoulder: Secondary | ICD-10-CM | POA: Diagnosis not present

## 2024-02-14 DIAGNOSIS — L820 Inflamed seborrheic keratosis: Secondary | ICD-10-CM | POA: Diagnosis not present

## 2024-02-14 DIAGNOSIS — D225 Melanocytic nevi of trunk: Secondary | ICD-10-CM | POA: Diagnosis not present

## 2024-02-14 DIAGNOSIS — D485 Neoplasm of uncertain behavior of skin: Secondary | ICD-10-CM | POA: Diagnosis not present

## 2024-02-14 DIAGNOSIS — L570 Actinic keratosis: Secondary | ICD-10-CM | POA: Diagnosis not present

## 2024-02-17 ENCOUNTER — Other Ambulatory Visit (HOSPITAL_COMMUNITY): Payer: Self-pay

## 2024-02-21 DIAGNOSIS — D0461 Carcinoma in situ of skin of right upper limb, including shoulder: Secondary | ICD-10-CM | POA: Diagnosis not present

## 2024-03-09 ENCOUNTER — Other Ambulatory Visit: Payer: Self-pay

## 2024-03-11 ENCOUNTER — Other Ambulatory Visit: Payer: Self-pay

## 2024-03-18 ENCOUNTER — Other Ambulatory Visit: Payer: Self-pay

## 2024-03-19 ENCOUNTER — Other Ambulatory Visit: Payer: Self-pay

## 2024-04-08 ENCOUNTER — Other Ambulatory Visit: Payer: Self-pay

## 2024-04-08 ENCOUNTER — Other Ambulatory Visit (HOSPITAL_COMMUNITY): Payer: Self-pay

## 2024-04-09 ENCOUNTER — Other Ambulatory Visit: Payer: Self-pay

## 2024-04-09 ENCOUNTER — Other Ambulatory Visit (HOSPITAL_COMMUNITY): Payer: Self-pay

## 2024-04-10 ENCOUNTER — Other Ambulatory Visit (HOSPITAL_COMMUNITY): Payer: Self-pay

## 2024-05-07 ENCOUNTER — Other Ambulatory Visit (HOSPITAL_COMMUNITY): Payer: Self-pay

## 2024-05-22 ENCOUNTER — Ambulatory Visit: Admitting: Family Medicine

## 2024-06-26 ENCOUNTER — Ambulatory Visit
